# Patient Record
Sex: Male | Born: 1960 | Race: White | Hispanic: No | Marital: Married | Smoking: Former smoker
Health system: Southern US, Community
[De-identification: ages and names within clinical notes are randomized; demographics above are authoritative.]

## PROBLEM LIST (undated history)

## (undated) DIAGNOSIS — C801 Malignant (primary) neoplasm, unspecified: Secondary | ICD-10-CM

## (undated) HISTORY — PX: CHOLECYSTECTOMY: SHX55

## (undated) HISTORY — PX: OTHER SURGICAL HISTORY: SHX169

---

## 2003-08-27 ENCOUNTER — Emergency Department (HOSPITAL_COMMUNITY): Admission: EM | Admit: 2003-08-27 | Discharge: 2003-08-27 | Payer: Self-pay | Admitting: Emergency Medicine

## 2006-04-24 ENCOUNTER — Emergency Department (HOSPITAL_COMMUNITY): Admission: EM | Admit: 2006-04-24 | Discharge: 2006-04-24 | Payer: Self-pay | Admitting: Emergency Medicine

## 2009-11-07 ENCOUNTER — Emergency Department (HOSPITAL_COMMUNITY): Admission: EM | Admit: 2009-11-07 | Discharge: 2009-11-07 | Payer: Self-pay | Admitting: Emergency Medicine

## 2017-06-27 ENCOUNTER — Emergency Department (HOSPITAL_COMMUNITY)
Admission: EM | Admit: 2017-06-27 | Discharge: 2017-06-27 | Disposition: A | Discharge status: Home / Self Care | Payer: Self-pay | Attending: Emergency Medicine | Admitting: Emergency Medicine

## 2017-06-27 ENCOUNTER — Encounter (HOSPITAL_COMMUNITY)
Admission: EM | Disposition: A | Discharge status: Home / Self Care | Payer: Self-pay | Source: Home / Self Care | Attending: Emergency Medicine

## 2017-06-27 ENCOUNTER — Encounter (HOSPITAL_COMMUNITY): Payer: Self-pay

## 2017-06-27 DIAGNOSIS — T39395A Adverse effect of other nonsteroidal anti-inflammatory drugs [NSAID], initial encounter: Secondary | ICD-10-CM

## 2017-06-27 DIAGNOSIS — K317 Polyp of stomach and duodenum: Secondary | ICD-10-CM | POA: Insufficient documentation

## 2017-06-27 DIAGNOSIS — X58XXXA Exposure to other specified factors, initial encounter: Secondary | ICD-10-CM | POA: Insufficient documentation

## 2017-06-27 DIAGNOSIS — W44F3XA Food entering into or through a natural orifice, initial encounter: Secondary | ICD-10-CM

## 2017-06-27 DIAGNOSIS — R1319 Other dysphagia: Secondary | ICD-10-CM

## 2017-06-27 DIAGNOSIS — T18128A Food in esophagus causing other injury, initial encounter: Secondary | ICD-10-CM | POA: Insufficient documentation

## 2017-06-27 DIAGNOSIS — Z87891 Personal history of nicotine dependence: Secondary | ICD-10-CM | POA: Insufficient documentation

## 2017-06-27 DIAGNOSIS — K2970 Gastritis, unspecified, without bleeding: Secondary | ICD-10-CM | POA: Insufficient documentation

## 2017-06-27 DIAGNOSIS — K222 Esophageal obstruction: Secondary | ICD-10-CM | POA: Insufficient documentation

## 2017-06-27 DIAGNOSIS — R1310 Dysphagia, unspecified: Secondary | ICD-10-CM

## 2017-06-27 DIAGNOSIS — K2960 Other gastritis without bleeding: Secondary | ICD-10-CM

## 2017-06-27 HISTORY — PX: ESOPHAGOGASTRODUODENOSCOPY: SHX5428

## 2017-06-27 HISTORY — PX: ESOPHAGEAL DILATION: SHX303

## 2017-06-27 SURGERY — EGD (ESOPHAGOGASTRODUODENOSCOPY)
Anesthesia: Moderate Sedation

## 2017-06-27 MED ORDER — MEPERIDINE HCL 100 MG/ML IJ SOLN
INTRAMUSCULAR | Status: DC | PRN
  Administered 2017-06-27: 25 mg via INTRAVENOUS
  Administered 2017-06-27: 50 mg via INTRAVENOUS
  Administered 2017-06-27 (×2): 25 mg via INTRAVENOUS

## 2017-06-27 MED ORDER — NITROGLYCERIN 0.4 MG SL SUBL
0.4000 mg | SUBLINGUAL_TABLET | Freq: Once | SUBLINGUAL | Status: AC
  Administered 2017-06-27: 0.4 mg via SUBLINGUAL
  Filled 2017-06-27: qty 1

## 2017-06-27 MED ORDER — SODIUM CHLORIDE 0.9 % IV SOLN: INTRAVENOUS | Status: DC

## 2017-06-27 MED ORDER — MINERAL OIL PO OIL
TOPICAL_OIL | ORAL | Status: AC
  Filled 2017-06-27: qty 30

## 2017-06-27 MED ORDER — LORAZEPAM 2 MG/ML IJ SOLN
1.0000 mg | Freq: Once | INTRAMUSCULAR | Status: AC
  Administered 2017-06-27: 1 mg via INTRAVENOUS
  Filled 2017-06-27: qty 1

## 2017-06-27 MED ORDER — LIDOCAINE VISCOUS HCL 2 % MT SOLN
OROMUCOSAL | Status: AC
  Filled 2017-06-27: qty 15

## 2017-06-27 MED ORDER — MIDAZOLAM HCL 5 MG/5ML IJ SOLN
INTRAMUSCULAR | Status: DC | PRN
  Administered 2017-06-27 (×2): 2 mg via INTRAVENOUS
  Administered 2017-06-27: 1 mg via INTRAVENOUS
  Administered 2017-06-27: 2 mg via INTRAVENOUS

## 2017-06-27 MED ORDER — STERILE WATER FOR IRRIGATION IR SOLN
Status: DC | PRN
  Administered 2017-06-27: 15 mL

## 2017-06-27 MED ORDER — MEPERIDINE HCL 100 MG/ML IJ SOLN
INTRAMUSCULAR | Status: AC
  Filled 2017-06-27: qty 2

## 2017-06-27 MED ORDER — LIDOCAINE VISCOUS HCL 2 % MT SOLN
OROMUCOSAL | Status: DC | PRN
  Administered 2017-06-27: 10 mL
  Administered 2017-06-27: 1 via OROMUCOSAL

## 2017-06-27 MED ORDER — MIDAZOLAM HCL 5 MG/5ML IJ SOLN
INTRAMUSCULAR | Status: AC
  Filled 2017-06-27: qty 10

## 2017-06-27 MED ORDER — ONDANSETRON HCL 4 MG/2ML IJ SOLN
INTRAMUSCULAR | Status: AC
  Filled 2017-06-27: qty 2

## 2017-06-27 MED ORDER — ONDANSETRON HCL 4 MG/2ML IJ SOLN
INTRAMUSCULAR | Status: DC | PRN
  Administered 2017-06-27: 4 mg via INTRAVENOUS

## 2017-06-27 MED ORDER — LIDOCAINE VISCOUS HCL 2 % MT SOLN: OROMUCOSAL | 1 refills | Status: DC

## 2017-06-27 MED ORDER — PANTOPRAZOLE SODIUM 40 MG PO TBEC: DELAYED_RELEASE_TABLET | ORAL | 11 refills | Status: DC

## 2017-06-27 MED ORDER — LIDOCAINE VISCOUS HCL 2 % MT SOLN: 15.0000 mL | Freq: Once | OROMUCOSAL | Status: DC

## 2017-06-27 MED ORDER — GLUCAGON HCL RDNA (DIAGNOSTIC) 1 MG IJ SOLR
2.0000 mg | Freq: Once | INTRAMUSCULAR | Status: AC
Start: 2017-06-27 — End: 2017-06-27
  Administered 2017-06-27: 2 mg via INTRAVENOUS
  Filled 2017-06-27: qty 2

## 2017-06-27 MED ORDER — SODIUM CHLORIDE 0.9 % IV SOLN
INTRAVENOUS | Status: DC
  Administered 2017-06-27: 09:00:00 via INTRAVENOUS

## 2017-06-27 NOTE — Discharge Instructions (Signed)
I REMOVED THE STEAK. 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 You have MILD gastritis FROM IBUPROFEN O NAPROXEN, AND SMALL BENIGN STOMACH POLYPS. I biopsied your ESOPHAGUS AND stomach.    DRINK WATER TO KEEP YOUR URINE LIGHT YELLOW.  CONTINUE YOUR WEIGHT LOSS EFFORTS.  FOLLOW A LOW FAT DIET. MEATS SHOULD BE BAKED, BROILED, OR BOILED. AVOID FRIED FOODS. SEE INFO BELOW.   START PROTONIX. TAKE 30 MINUTES PRIOR TO MEALS TWICE DAILY FOR 3 MOS THEN ONCE DAILY.  USE VISCOUS LIDOCAINE 2 TSP with 2 tsp MAALOX OR MYLANTA EVERY 4 HRS TO REDUCE CHEST, OR UPPER ABDOMINAL PAIN OR HEARTBURN. USE NO MORE THAN 8 DOSES A DAY. IT WILL MAKE YOUR MOUTH, ESOPHAGUS, AND STOMACH NUMB.  YOUR BIOPSY RESULTS WILL BE AVAILABLE IN 7 DAYS.   FOLLOW UP IN 3 MOS. WE CAN SCHEDULE YOUR COLONOSCOPY BEFORE OR AFTER YOU NEXT OFFICE VISIT. YOU NEED YOUR NEXT ENDSCOPY PERFORMED WITH PROPOFOL.  UPPER ENDOSCOPY AFTER CARE Read the instructions outlined below and refer to this sheet in the next week. These discharge instructions provide you with general information on caring for yourself after you leave the hospital. While your treatment has been planned according to the most current medical practices available, unavoidable complications occasionally occur. If you have any problems or questions after discharge, call DR. Xochilt Conant, [phone].  ACTIVITY  You may resume your regular activity, but move at a slower pace for the next 24 hours.   Take frequent rest periods for the next 24 hours.   Walking will help get rid of the air and reduce the bloated feeling in your belly (abdomen).   No driving for 24 hours (because of the medicine (anesthesia) used during the test).   You may shower.   Do not sign any important legal documents or operate any machinery for 24 hours (because of the anesthesia used during the test).    NUTRITION  Drink plenty of fluids.   You may resume your normal diet as instructed by your  doctor.   Begin with a light meal and progress to your normal diet. Heavy or fried foods are harder to digest and may make you feel sick to your stomach (nauseated).   Avoid alcoholic beverages for 24 hours or as instructed.    MEDICATIONS  You may resume your normal medications.   WHAT YOU CAN EXPECT TODAY  Some feelings of bloating in the abdomen.   Passage of more gas than usual.    IF YOU HAD A BIOPSY TAKEN DURING THE UPPER ENDOSCOPY:  Eat a soft diet IF YOU HAVE NAUSEA, BLOATING, ABDOMINAL PAIN, OR VOMITING.    FINDING OUT THE RESULTS OF YOUR TEST Not all test results are available during your visit. DR. Oneida Alar WILL CALL YOU WITHIN 14 DAYS OF YOUR PROCEDUE WITH YOUR RESULTS. Do not assume everything is normal if you have not heard from DR. Sheridan Hew, CALL HER OFFICE AT [phone].  SEEK IMMEDIATE MEDICAL ATTENTION AND CALL THE OFFICE: [phone] IF:  You have more than a spotting of blood in your stool.   Your belly is swollen (abdominal distention).   You are nauseated or vomiting.   You have a temperature over 101F.   You have abdominal pain or discomfort that is severe or gets worse throughout the day.  Gastritis  Gastritis is an inflammation (the body's way of reacting to injury and/or infection) of the stomach. It is often caused by viral or bacterial (germ) infections. It can also be caused  BY ASPIRIN, BC/GOODY POWDER'S, (IBUPROFEN) MOTRIN, OR ALEVE (NAPROXEN), chemicals (including alcohol), SPICY FOODS, and medications. This illness may be associated with generalized malaise (feeling tired, not well), UPPER ABDOMINAL STOMACH cramps, and fever. One common bacterial cause of gastritis is an organism known as H. Pylori. This can be treated with antibiotics.   ESOPHAGEAL STRICTURE  Esophageal strictures can be caused by stomach acid backing up into the tube that carries food from the mouth down to the stomach (lower esophagus).  TREATMENT There are a number of  medicines used to treat reflux/stricture, including: Antacids.  Proton-pump inhibitors: PROTONIX ZANTAC OR PEPCID    Low-Fat Diet BREADS, CEREALS, PASTA, RICE, DRIED PEAS, AND BEANS These products are high in carbohydrates and most are low in fat. Therefore, they can be increased in the diet as substitutes for fatty foods. They too, however, contain calories and should not be eaten in excess. Cereals can be eaten for snacks as well as for breakfast.   FRUITS AND VEGETABLES It is good to eat fruits and vegetables. Besides being sources of fiber, both are rich in vitamins and some minerals. They help you get the daily allowances of these nutrients. Fruits and vegetables can be used for snacks and desserts.  MEATS Limit lean meat, chicken, [place], and fish to no more than 6 ounces per day. Beef, Pork, and Lamb Use lean cuts of beef, pork, and lamb. Lean cuts include:  Extra-lean ground beef.  Arm roast.  Sirloin tip.  Center-cut ham.  Round steak.  Loin chops.  Rump roast.  Tenderloin.  Trim all fat off the outside of meats before cooking. It is not necessary to severely decrease the intake of red meat, but lean choices should be made. Lean meat is rich in protein and contains a highly absorbable form of iron. Premenopausal women, in particular, should avoid reducing lean red meat because this could increase the risk for low red blood cells (iron-deficiency anemia).  Chicken and [place] These are good sources of protein. The fat of poultry can be reduced by removing the skin and underlying fat layers before cooking. Chicken and [place] can be substituted for lean red meat in the diet. Poultry should not be fried or covered with high-fat sauces. Fish and Shellfish Fish is a good source of protein. Shellfish contain cholesterol, but they usually are low in saturated fatty acids. The preparation of fish is important. Like chicken and [place], they should not be fried or covered with high-fat  sauces. EGGS Egg whites contain no fat or cholesterol. They can be eaten often. Try 1 to 2 egg whites instead of whole eggs in recipes or use egg substitutes that do not contain yolk. MILK AND DAIRY PRODUCTS Use skim or 1% milk instead of 2% or whole milk. Decrease whole milk, natural, and processed cheeses. Use nonfat or low-fat (2%) cottage cheese or low-fat cheeses made from vegetable oils. Choose nonfat or low-fat (1 to 2%) yogurt. Experiment with evaporated skim milk in recipes that call for heavy cream. Substitute low-fat yogurt or low-fat cottage cheese for sour cream in dips and salad dressings. Have at least 2 servings of low-fat dairy products, such as 2 glasses of skim (or 1%) milk each day to help get your daily calcium intake. FATS AND OILS Reduce the total intake of fats, especially saturated fat. Butterfat, lard, and beef fats are high in saturated fat and cholesterol. These should be avoided as much as possible. Vegetable fats do not contain cholesterol, but certain vegetable  fats, such as coconut oil, palm oil, and palm kernel oil are very high in saturated fats. These should be limited. These fats are often used in bakery goods, processed foods, popcorn, oils, and nondairy creamers. Vegetable shortenings and some peanut butters contain hydrogenated oils, which are also saturated fats. Read the labels on these foods and check for saturated vegetable oils. Unsaturated vegetable oils and fats do not raise blood cholesterol. However, they should be limited because they are fats and are high in calories. Total fat should still be limited to 30% of your daily caloric intake. Desirable liquid vegetable oils are corn oil, cottonseed oil, olive oil, canola oil, safflower oil, soybean oil, and sunflower oil. Peanut oil is not as good, but small amounts are acceptable. Buy a heart-healthy tub margarine that has no partially hydrogenated oils in the ingredients. Mayonnaise and salad dressings often are  made from unsaturated fats, but they should also be limited because of their high calorie and fat content. Seeds, nuts, peanut butter, olives, and avocados are high in fat, but the fat is mainly the unsaturated type. These foods should be limited mainly to avoid excess calories and fat. OTHER EATING TIPS Snacks  Most sweets should be limited as snacks. They tend to be rich in calories and fats, and their caloric content outweighs their nutritional value. Some good choices in snacks are graham crackers, melba toast, soda crackers, bagels (no egg), English muffins, fruits, and vegetables. These snacks are preferable to snack crackers, [place] fries, TORTILLA CHIPS, and POTATO chips. Popcorn should be air-popped or cooked in small amounts of liquid vegetable oil. Desserts Eat fruit, low-fat yogurt, and fruit ices instead of pastries, cake, and cookies. Sherbet, angel food cake, gelatin dessert, frozen low-fat yogurt, or other frozen products that do not contain saturated fat (pure fruit juice bars, frozen ice pops) are also acceptable.  COOKING METHODS Choose those methods that use little or no fat. They include: Poaching.  Braising.  Steaming.  Grilling.  Baking.  Stir-frying.  Broiling.  Microwaving.  Foods can be cooked in a nonstick pan without added fat, or use a nonfat cooking spray in regular cookware. Limit fried foods and avoid frying in saturated fat. Add moisture to lean meats by using water, broth, cooking wines, and other nonfat or low-fat sauces along with the cooking methods mentioned above. Soups and stews should be chilled after cooking. The fat that forms on top after a few hours in the refrigerator should be skimmed off. When preparing meals, avoid using excess salt. Salt can contribute to raising blood pressure in some people.  EATING AWAY FROM HOME Order entres, potatoes, and vegetables without sauces or butter. When meat exceeds the size of a deck of cards (3 to 4 ounces), the  rest can be taken home for another meal. Choose vegetable or fruit salads and ask for low-calorie salad dressings to be served on the side. Use dressings sparingly. Limit high-fat toppings, such as bacon, crumbled eggs, cheese, sunflower seeds, and olives. Ask for heart-healthy tub margarine instead of butter.

## 2017-06-27 NOTE — ED Notes (Signed)
Patient transported to Endo OR.

## 2017-06-27 NOTE — Op Note (Signed)
[facility] Patient Name: Leonard [name] Procedure Date: 06/27/2017 10:50 AM MRN: [id number] Date of Birth: [birth year] Attending MD: Barney Drain MD, MD CSN: [id number] Age: 57 Admit Type: Outpatient Procedure:                Upper GI endoscopy-removal foreign body/COLD                            FORCEPSBIOPSY/ESOPHAGEAL DILATION Indications:              Dysphagia-FOOD IMPACTION SINCE 5 PM YESTERDAY Providers:                Barney Drain MD, MD, Lurline Del, RN, Aram Candela Referring MD:             Edwinna Areola. [place] Crane MD Medicines:                Ondansetron 4 mg IV, Meperidine 125 mg IV,                            Midazolam 7 mg IV Complications:            No immediate complications. Estimated Blood Loss:     Estimated blood loss was minimal. Procedure:                Pre-Anesthesia Assessment:                           - Prior to the procedure, a History and Physical                            was performed, and patient medications and                            allergies were reviewed. The patient's tolerance of                            previous anesthesia was also reviewed. The risks                            and benefits of the procedure and the sedation                            options and risks were discussed with the patient.                            All questions were answered, and informed consent                            was obtained. Prior Anticoagulants: The patient has                            taken ibuprofen, last dose was 1 day prior to                            procedure. ASA Grade Assessment: II - A patient  with mild systemic disease. After reviewing the                            risks and benefits, the patient was deemed in                            satisfactory condition to undergo the procedure.                            After obtaining informed consent, the endoscope was                            passed under direct  vision. Throughout the                            procedure, the patient's blood pressure, pulse, and                            oxygen saturations were monitored continuously. The                            EG-[id number] ([id number]) scope was introduced through the                            mouth, and advanced to the second part of duodenum.                            The upper GI endoscopy was somewhat difficult due                            to the patient's agitation. Successful completion                            of the procedure was aided by increasing the dose                            of sedation medication. The patient tolerated the                            procedure fairly well. Scope In: 51:02:58 AM Scope Out: 12:28:52 PM Total Procedure Duration: 0 hours 32 minutes 44 seconds  Findings:      Food was found in the distal esophagus. Removal of food was accomplished.      Two benign-appearing, intrinsic moderate (circumferential scarring or       stenosis; an endoscope may pass) stenoses were found in the mid       esophagus. The narrowest stenosis measured 1.2 cm (inner diameter). The       stenoses were traversed. A guidewire was placed and the scope was       withdrawn. Dilation was performed with a Savary dilator with mild       resistance at 12.8 mm and moderate resistance at 14 mm, 15 mm, 16 mm and       17 mm. Biopsies were obtained from the proximal(20 CM FROM INCISORS)  and       distal esophagus(40 CM FROM INCISORS) with cold forceps for histology of       suspected eosinophilic esophagitis.      Localized mild inflammation characterized by congestion (edema) and       erythema was found in the gastric body and in the gastric antrum.       Biopsies were taken with a cold forceps for Helicobacter pylori       testing(3: ANTRUM/2: BODY).      Multiple small sessile polyps with no bleeding and no stigmata of recent       bleeding were found in the gastric fundus and in the  gastric body. The       polyp was removed with a cold biopsy forceps. Resection and retrieval       were complete.      The examined duodenum was normal. Impression:               - Food in the distal esophagus. Removal was                            successful.                           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 Dilated.                           - MILD Gastritis DUE TO IBUPROFEN. Biopsied.                           - Multiple gastric polyps. Resected and retrieved. Moderate Sedation:      Moderate (conscious) sedation was administered by the endoscopy nurse       and supervised by the endoscopist. The following parameters were       monitored: oxygen saturation, heart rate, blood pressure, and response       to care. Total physician intraservice time was 46 minutes. Recommendation:           - Await pathology results.                           - Low fat diet.                           - Continue present medications.                           - Use Protonix (pantoprazole) 40 mg PO BID for 3                            months THEN ONCE DAILY FOREVER.                           - Return to my office in 3 months.[place]  [facility] AFTER NEXT OPV.                           - Patient has a contact number available for                            emergencies. The signs and symptoms of potential                            delayed complications were discussed with the                            patient. Return to normal activities tomorrow.                            Written discharge instructions were provided to the                            patient. Procedure Code(s):        --- Professional ---                           [phone], Esophagogastroduodenoscopy, flexible,                            transoral; with removal of foreign body(s)                           43248, Esophagogastroduodenoscopy,  flexible,                            transoral; with insertion of guide wire followed by                            passage of dilator(s) through esophagus over guide                            wire                           43239, Esophagogastroduodenoscopy, flexible,                            transoral; with biopsy, single or multiple                           G0500, Moderate sedation services provided by the                            same physician or other qualified health care                            professional performing a gastrointestinal                            endoscopic service that sedation supports,  requiring the presence of an independent trained                            observer to assist in the monitoring of the                            patient's level of consciousness and physiological                            status; initial 15 minutes of intra-service time;                            patient age 47 years or older (additional time may                            be reported with [phone], as appropriate)                           [phone], Moderate sedation services provided by the                            same physician or other qualified health care                            professional performing the diagnostic or                            therapeutic service that the sedation supports,                            requiring the presence of an independent trained                            observer to assist in the monitoring of the                            patient's level of consciousness and physiological                            status; each additional 15 minutes intraservice                            time (List separately in addition to code for                            primary service)                           [phone], Moderate sedation services provided by the                            same physician or other qualified  health care  professional performing the diagnostic or                            therapeutic service that the sedation supports,                            requiring the presence of an independent trained                            observer to assist in the monitoring of the                            patient's level of consciousness and physiological                            status; each additional 15 minutes intraservice                            time (List separately in addition to code for                            primary service) Diagnosis Code(s):        --- Professional ---                           O11.572I, Food in esophagus causing other injury,                            initial encounter                           K22.2, Esophageal obstruction                           K29.70, Gastritis, unspecified, without bleeding                           K31.7, Polyp of stomach and duodenum                           R13.10, Dysphagia, unspecified CPT copyright 2017 American [employer] Association. All rights reserved. The codes documented in this report are preliminary and upon coder review may  be revised to meet current compliance requirements. Barney Drain, MD Barney Drain MD, MD 06/27/2017 12:48:03 PM This report has been signed electronically. Number of Addenda: 0

## 2017-06-27 NOTE — ED Triage Notes (Addendum)
Pt reports that he has a piece of steak lodged in throat since last night approx 5 pm. Able to swallow saliva and denies difficulty breathing. Unable to swallow liquids

## 2017-06-27 NOTE — ED Provider Notes (Signed)
[facility] EMERGENCY DEPARTMENT Provider Note   CSN: [id number] Arrival date & time: 06/27/17  0539     History   Chief Complaint Chief Complaint  Patient presents with  . Dysphagia    HPI Doc Mandala Southern is a 57 y.o. male.  HPI  68yM with dysphagia.  Patient was eating steak around 5 PM yesterday when this occurred.  Since then he has had a persistent sensation foreign body sensation in his throat.  He has been unable to eat or drink since.  States that the saliva will pool in his mouth and then he has to spit it out. No acute respiratory complaints.  He has had problems previously with things sticking but usually this resolves spontaneously after few minutes.  History reviewed. No pertinent past medical history.  There are no active problems to display for this patient.   Past Surgical History:  Procedure Laterality Date  . CHOLECYSTECTOMY    . knee sx          Home Medications    Prior to Admission medications   Not on File    Family History No family history on file.  Social History Social History   Tobacco Use  . Smoking status: Former Smoker  Substance Use Topics  . Alcohol use: Yes  . Drug use: Never     Allergies   Patient has no allergy information on record.   Review of Systems Review of Systems All systems reviewed and negative, other than as noted in HPI.   Physical Exam Updated Vital Signs BP (!) 146/93   Pulse 62   Temp 97.9 F (36.6 C) (Oral)   Resp 18   Wt 108.9 kg (240 lb)   SpO2 100%   Physical Exam  Constitutional: He appears well-developed and well-nourished. No distress.  HENT:  Head: Normocephalic and atraumatic.  Eyes: Conjunctivae are normal. Right eye exhibits no discharge. Left eye exhibits no discharge.  Neck: Neck supple.  Cardiovascular: Normal rate, regular rhythm and normal heart sounds. Exam reveals no gallop and no friction rub.  No murmur heard. Pulmonary/Chest: Effort normal and breath sounds normal.  No respiratory distress.  Abdominal: Soft. He exhibits no distension. There is no tenderness.  Musculoskeletal: He exhibits no edema or tenderness.  Neurological: He is alert.  Skin: Skin is warm and dry.  Psychiatric: He has a normal mood and affect. His behavior is normal. Thought content normal.  Nursing note and vitals reviewed.    ED Treatments / Results  Labs (all labs ordered are listed, but only abnormal results are displayed) Labs Reviewed - No data to display  EKG None  Radiology No results found.  Procedures Procedures (including critical care time)  Medications Ordered in ED Medications  0.9 %  sodium chloride infusion (has no administration in time range)  glucagon (human recombinant) (GLUCAGEN) injection 2 mg (has no administration in time range)  LORazepam (ATIVAN) injection 1 mg (has no administration in time range)     Initial Impression / Assessment and Plan / ED Course  I have reviewed the triage vital signs and the nursing notes.  Pertinent labs & imaging results that were available during my care of the patient were reviewed by me and considered in my medical decision making (see chart for details).     57 year old male with esophageal food impaction.  Glucagon and nitroglycerin tried without resolution.  IV was established.  GI was consulted.  Final Clinical Impressions(s) / ED Diagnoses   Final  diagnoses:  Esophageal obstruction due to food impaction    ED Discharge Orders    None       Virgel Manifold, MD 06/27/17 [phone]

## 2017-06-27 NOTE — Consult Note (Addendum)
  Primary Care Physician:  Celene Squibb, MD Primary Gastroenterologist:  Dr. Oneida Alar  Pre-Procedure History & Physical: HPI:  Leonard [name] is a 57 y.o. male here for STEAK STUCK IN HIS ESOPHAGUS SINCE 5 PM YESTERDAY. PROBLEMS SWALLOWING FOR MANY YEARS. OVER PAST MONTH HAS GOTTEN WORSE. HAD GB TAKEN OUT FOR ABDOMINAL PAIN BUT NONE NOW. NO WEIGHT LOSS. NO TCS EVER. HEARTBURN IF EATS SOMETHING SPICY.  PT DENIES FEVER, CHILLS, HEMATOCHEZIA, HEMATEMESIS, nausea, vomiting, melena, diarrhea, CHEST PAIN, SHORTNESS OF BREATH,  CHANGE IN BOWEL IN HABITS, constipation, abdominal pain, OR problems with sedation.   PAST MEDICAL HISTORY: 1. KNEE PAIN, RIGHT  Past Surgical History:  Procedure Laterality Date  . CHOLECYSTECTOMY    . knee sx      Prior to Admission medications   IBUPROFEN 3 IN THE AM   Family History  Problem Relation Age of Onset  . Colon cancer Neg Hx   . Colon polyps Neg Hx     Social History   Tobacco Use  . Smoking status: Former Research scientist (life sciences)  . Smokeless tobacco: Never Used  Substance Use Topics  . Alcohol use: Yes  . Drug use: Never     Review of Systems: See HPI, otherwise negative ROS   Physical Exam: BP 136/89   Pulse 65   Temp 97.9 F (36.6 C) (Oral)   Resp 18   Wt 240 lb (108.9 kg)   SpO2 95%  General:   Alert,  pleasant and cooperative in NAD Head:  Normocephalic and atraumatic. Neck:  Supple; Lungs:  Clear throughout to auscultation.    Heart:  Regular rate and rhythm. Abdomen:  Soft, nontender and nondistended. Normal bowel sounds, without guarding, and without rebound.   Neurologic:  Alert and  oriented x4;  grossly normal neurologically.  Impression/Plan:    DYSPHAGIA  PLAN:  EGD/DIL TODAY. DISCUSSED PROCEDURE, BENEFITS, & RISKS: < 1% chance of medication reaction, bleeding, OR perforation. TCS IN THE FUTURE.

## 2017-06-30 ENCOUNTER — Encounter: Payer: Self-pay | Admitting: Gastroenterology

## 2017-06-30 ENCOUNTER — Telehealth: Payer: Self-pay | Admitting: Gastroenterology

## 2017-06-30 NOTE — Telephone Encounter (Signed)
Pt is aware of results and plan.

## 2017-06-30 NOTE — Telephone Encounter (Signed)
Please call pt. HIS ESOPHAGEAL BIOPSIES SHOW REFLUX. His stomach Bx shows BENIGN POLYPS REMOVED FROM HIS STOMACH.    DRINK WATER TO KEEP YOUR URINE LIGHT YELLOW.  CONTINUE YOUR WEIGHT LOSS EFFORTS.  FOLLOW A LOW FAT DIET. MEATS SHOULD BE BAKED, BROILED, OR BOILED. AVOID FRIED FOODS.    START PROTONIX. TAKE 30 MINUTES PRIOR TO MEALS TWICE DAILY FOR 3 MOS THEN ONCE DAILY.  USE VISCOUS LIDOCAINE 2 TSP with 2 tsp MAALOX OR MYLANTA EVERY 4 HRS TO REDUCE CHEST, OR UPPER ABDOMINAL PAIN OR HEARTBURN. USE NO MORE THAN 8 DOSES A DAY. IT WILL MAKE YOUR MOUTH, ESOPHAGUS, AND STOMACH NUMB.  FOLLOW UP IN 3 MOS E30 DYSPHAGIA/GERD. WE CAN SCHEDULE YOUR COLONOSCOPY BEFORE OR AFTER YOU NEXT OFFICE VISIT. YOU NEED YOUR NEXT ENDSCOPY PERFORMED WITH PROPOFOL.

## 2017-06-30 NOTE — Telephone Encounter (Signed)
PATIENT SCHEDULED  °

## 2017-07-02 ENCOUNTER — Encounter (HOSPITAL_COMMUNITY): Payer: Self-pay | Admitting: Gastroenterology

## 2017-09-30 ENCOUNTER — Ambulatory Visit: Payer: Self-pay | Admitting: Gastroenterology

## 2018-08-01 ENCOUNTER — Encounter (HOSPITAL_COMMUNITY): Payer: Self-pay

## 2018-08-01 ENCOUNTER — Observation Stay (HOSPITAL_COMMUNITY)
Admission: EM | Admit: 2018-08-01 | Discharge: 2018-08-02 | Disposition: A | Discharge status: Home / Self Care | Payer: Self-pay | Attending: Internal Medicine | Admitting: Internal Medicine

## 2018-08-01 ENCOUNTER — Emergency Department (HOSPITAL_COMMUNITY): Payer: Self-pay

## 2018-08-01 ENCOUNTER — Other Ambulatory Visit: Payer: Self-pay

## 2018-08-01 DIAGNOSIS — Z20828 Contact with and (suspected) exposure to other viral communicable diseases: Secondary | ICD-10-CM | POA: Insufficient documentation

## 2018-08-01 DIAGNOSIS — Z87891 Personal history of nicotine dependence: Secondary | ICD-10-CM | POA: Insufficient documentation

## 2018-08-01 DIAGNOSIS — M79669 Pain in unspecified lower leg: Secondary | ICD-10-CM

## 2018-08-01 DIAGNOSIS — R2242 Localized swelling, mass and lump, left lower limb: Principal | ICD-10-CM | POA: Insufficient documentation

## 2018-08-01 DIAGNOSIS — L02612 Cutaneous abscess of left foot: Secondary | ICD-10-CM | POA: Insufficient documentation

## 2018-08-01 DIAGNOSIS — M7989 Other specified soft tissue disorders: Secondary | ICD-10-CM

## 2018-08-01 LAB — CBC WITH DIFFERENTIAL/PLATELET
Abs Immature Granulocytes: 0.01 10*3/uL (ref 0.00–0.07)
Basophils Absolute: 0 10*3/uL (ref 0.0–0.1)
Basophils Relative: 1 %
Eosinophils Absolute: 0.3 10*3/uL (ref 0.0–0.5)
Eosinophils Relative: 5 %
HCT: 45.1 % (ref 39.0–52.0)
Hemoglobin: 15.1 g/dL (ref 13.0–17.0)
Immature Granulocytes: 0 %
Lymphocytes Relative: 21 %
Lymphs Abs: 1.3 10*3/uL (ref 0.7–4.0)
MCH: 31.5 pg (ref 26.0–34.0)
MCHC: 33.5 g/dL (ref 30.0–36.0)
MCV: 94 fL (ref 80.0–100.0)
Monocytes Absolute: 0.6 10*3/uL (ref 0.1–1.0)
Monocytes Relative: 11 %
Neutro Abs: 3.7 10*3/uL (ref 1.7–7.7)
Neutrophils Relative %: 62 %
Platelets: 255 10*3/uL (ref 150–400)
RBC: 4.8 MIL/uL (ref 4.22–5.81)
RDW: 12.6 % (ref 11.5–15.5)
WBC: 6 10*3/uL (ref 4.0–10.5)
nRBC: 0 % (ref 0.0–0.2)

## 2018-08-01 LAB — BASIC METABOLIC PANEL
Anion gap: 9 (ref 5–15)
BUN: 13 mg/dL (ref 6–20)
CO2: 24 mmol/L (ref 22–32)
Calcium: 9.1 mg/dL (ref 8.9–10.3)
Chloride: 108 mmol/L (ref 98–111)
Creatinine, Ser: 0.92 mg/dL (ref 0.61–1.24)
GFR calc Af Amer: >60 mL/min (ref >60)
GFR calc non Af Amer: >60 mL/min (ref >60)
Glucose, Bld: 109 mg/dL — ABNORMAL HIGH (ref 70–99)
Potassium: 3.8 mmol/L (ref 3.5–5.1)
Sodium: 141 mmol/L (ref 135–145)

## 2018-08-01 LAB — SARS CORONAVIRUS 2 BY RT PCR (HOSPITAL ORDER, PERFORMED IN ~~LOC~~ HOSPITAL LAB): SARS Coronavirus 2: NEGATIVE

## 2018-08-01 MED ORDER — VANCOMYCIN HCL IN DEXTROSE 1-5 GM/200ML-% IV SOLN
1000.0000 mg | INTRAVENOUS | Status: AC
  Administered 2018-08-01 (×2): 1000 mg via INTRAVENOUS
  Filled 2018-08-01 (×2): qty 200

## 2018-08-01 MED ORDER — ACETAMINOPHEN 325 MG PO TABS: 650.0000 mg | ORAL_TABLET | Freq: Four times a day (QID) | ORAL | Status: DC | PRN

## 2018-08-01 MED ORDER — MORPHINE SULFATE (PF) 2 MG/ML IV SOLN: 2.0000 mg | INTRAVENOUS | Status: DC | PRN

## 2018-08-01 MED ORDER — VANCOMYCIN HCL 1.25 G IV SOLR
1250.0000 mg | Freq: Two times a day (BID) | INTRAVENOUS | Status: DC
  Administered 2018-08-02: 1250 mg via INTRAVENOUS
  Filled 2018-08-01 (×3): qty 1250

## 2018-08-01 MED ORDER — ONDANSETRON HCL 4 MG/2ML IJ SOLN: 4.0000 mg | Freq: Four times a day (QID) | INTRAMUSCULAR | Status: DC | PRN

## 2018-08-01 MED ORDER — ACETAMINOPHEN 650 MG RE SUPP: 650.0000 mg | Freq: Four times a day (QID) | RECTAL | Status: DC | PRN

## 2018-08-01 MED ORDER — ONDANSETRON HCL 4 MG PO TABS: 4.0000 mg | ORAL_TABLET | Freq: Four times a day (QID) | ORAL | Status: DC | PRN

## 2018-08-01 MED ORDER — POLYETHYLENE GLYCOL 3350 17 G PO PACK: 17.0000 g | PACK | Freq: Every day | ORAL | Status: DC | PRN

## 2018-08-01 NOTE — Progress Notes (Signed)
Pharmacy Antibiotic Note  Leonard [name] is a 58 y.o. male admitted on 08/01/2018 with abscess.  Pharmacy has been consulted for Vancomycin dosing.  Plan: Vancomycin 2000 mg IV x 1 dose. Vancomycin 1250 mg IV every 12 hours.  Goal trough 15-20 mcg/mL.  Monitor labs, c/s, and  vanco levels as indicated.  Height: 5\' 11"  (180.3 cm) Weight: 250 lb (113.4 kg) IBW/kg (Calculated) : 75.3  Temp (24hrs), Avg:97.9 F (36.6 C), Min:97.9 F (36.6 C), Max:97.9 F (36.6 C)  Recent Labs  Lab 08/01/18 1211  WBC 6.0  CREATININE 0.92    Estimated Creatinine Clearance: 112 mL/min (by C-G formula based on SCr of 0.92 mg/dL).    No Known Allergies  Antimicrobials this admission: Vanco 7/20 >>     Dose adjustments this admission: N/A  Microbiology results: None pending  Thank you for allowing pharmacy to be a part of this patient's care.  Ramond Craver 08/01/2018 7:30 PM

## 2018-08-01 NOTE — ED Triage Notes (Signed)
Pt presents to ED with left leg swelling x 3 weeks. Pt states he noticed a knot pop up about 3 weeks ago and it has progressively gotten worse since then.

## 2018-08-01 NOTE — ED Notes (Signed)
Report given to [facility] on 300

## 2018-08-01 NOTE — ED Notes (Addendum)
Right thigh noted to be swollen and painful to touch

## 2018-08-01 NOTE — ED Provider Notes (Signed)
[facility] EMERGENCY DEPARTMENT Provider Note   CSN: [id number] Arrival date & time: 08/01/18  1059    History   Chief Complaint Chief Complaint  Patient presents with  . Leg Swelling    HPI Leonard [name] is a 58 y.o. male.     HPI Patient presents with left calf pain.  Began around a month ago.  Initially there was some swelling but now his pain.  Even having difficulty walking on it now.  There was no injury or pop that began with the pain is.  States the foot and lower leg were so swollen he had trouble getting his flip-flops on yesterday.  No chest pain or trouble breathing.  No fevers.  Has not really had swelling like this before. Also on his right fifth toe has a hole.  States it started as a little blister and now is more swollen and painful.  There has been drainage out of it. History reviewed. No pertinent past medical history.  Patient Active Problem List   Diagnosis Date Noted  . Food impaction of esophagus 06/27/2017  . NSAID induced gastritis   . Esophageal dysphagia     Past Surgical History:  Procedure Laterality Date  . CHOLECYSTECTOMY    . ESOPHAGEAL DILATION N/A 06/27/2017   Procedure: ESOPHAGEAL DILATION;  Surgeon: Danie Binder, MD;  Location: [facility];  Service: Endoscopy;  Laterality: N/A;  . ESOPHAGOGASTRODUODENOSCOPY N/A 06/27/2017   Procedure: ESOPHAGOGASTRODUODENOSCOPY (EGD);  Surgeon: Danie Binder, MD;  Location: [facility];  Service: Endoscopy;  Laterality: N/A;  . knee sx          Home Medications    Prior to Admission medications   Medication Sig Start Date End Date Taking? Authorizing Provider  naproxen sodium (ALEVE) 220 MG tablet Take 660 mg by mouth daily as needed (pain and inflamation).   Yes [provider]  lidocaine (XYLOCAINE) 2 % solution 2 TSP  PO 30 MINUTES PRIOR TO MEALS PRN FOR ABDOMINAL OR CHEST PAIN. REPEAT DOSE EVERY 4 HOURS. NO MORE THAN 8 DOSES A DAY. Patient not taking: Reported on 08/01/2018  06/27/17   Danie Binder, MD  pantoprazole (PROTONIX) 40 MG tablet 1 PO 30 MINUTES PRIOR TO MEALS BID FOR 3 MOS THEN QD Patient not taking: Reported on 08/01/2018 06/27/17   Danie Binder, MD    Family History Family History  Problem Relation Age of Onset  . Colon cancer Neg Hx   . Colon polyps Neg Hx     Social History Social History   Tobacco Use  . Smoking status: Former Research scientist (life sciences)  . Smokeless tobacco: Never Used  Substance Use Topics  . Alcohol use: Yes  . Drug use: Never     Allergies   Patient has no known allergies.   Review of Systems Review of Systems  Constitutional: Negative for appetite change.  Respiratory: Negative for shortness of breath.   Cardiovascular: Positive for leg swelling. Negative for chest pain.  Gastrointestinal: Negative for abdominal pain.  Genitourinary: Negative for flank pain.  Musculoskeletal: Negative for back pain.       Right little toe welling and drainage.  Skin: Positive for wound.  Neurological: Negative for weakness.  Psychiatric/Behavioral: Negative for confusion.     Physical Exam Updated Vital Signs BP (!) 155/96   Pulse 61   Temp 97.9 F (36.6 C) (Oral)   Resp 14   Ht 5\' 11"  (1.803 m)   Wt 113.4 kg  SpO2 96%   BMI 34.87 kg/m   Physical Exam Vitals signs and nursing note reviewed.  HENT:     Head: Normocephalic.  Eyes:     Pupils: Pupils are equal, round, and reactive to light.  Cardiovascular:     Rate and Rhythm: Normal rate and regular rhythm.  Pulmonary:     Effort: Pulmonary effort is normal.     Breath sounds: No wheezing, rhonchi or rales.  Abdominal:     Tenderness: There is no abdominal tenderness.  Musculoskeletal:     Comments: Moderate edema over left lower leg and foot.  Tenderness over inferior aspect of calf.  Does have flexion and extension at the calf although with some pain.  No ecchymosis.  No erythema. Right fifth toe on the lateral plantar aspect has a small hole.  With squeezing of  the toe there is watery purulent discharge comes out of this hole.  No clear bony tenderness.  Skin:    General: Skin is warm.  Neurological:     Mental Status: He is alert. Mental status is at baseline.      ED Treatments / Results  Labs (all labs ordered are listed, but only abnormal results are displayed) Labs Reviewed  BASIC METABOLIC PANEL - Abnormal; Notable for the following components:      Result Value   Glucose, Bld 109 (*)    All other components within normal limits  CBC WITH DIFFERENTIAL/PLATELET    EKG None  Radiology No results found.  Procedures Procedures (including critical care time)  Medications Ordered in ED Medications - No data to display   Initial Impression / Assessment and Plan / ED Course  I have reviewed the triage vital signs and the nursing notes.  Pertinent labs & imaging results that were available during my care of the patient were reviewed by me and considered in my medical decision making (see chart for details).        Patient with left lower leg pain and swelling.  Ultrasound pending.  Also right fifth toe abscess.  Draining will require antibiotics.  Care will be turned over to Dr. Lacinda Axon.  Final Clinical Impressions(s) / ED Diagnoses   Final diagnoses:  Calf pain  Toe abscess, left  Left leg swelling    ED Discharge Orders    None       Davonna Belling, MD 08/01/18 1450

## 2018-08-01 NOTE — ED Provider Notes (Signed)
Ultrasound of left lower extremity reveals a 15.9 cm mixed echogenic subcutaneous mass.  Physical exam suggests an abscess.  Radiologist recommended an MRI of this area.  Will consult hospitalist for probable admission to [facility].   Nat Christen, MD 08/01/18 [id number]

## 2018-08-01 NOTE — ED Notes (Signed)
Results of [place] and xray given to EDP

## 2018-08-01 NOTE — H&P (Signed)
History and Physical    Krishan Mcbreen Lariviere KTG:[id number] DOB: [birth year] DOA: 08/01/2018  PCP: Celene Squibb, MD   Patient coming from: Home  I have personally briefly reviewed patient's old medical records in [place]  Chief Complaint: Left leg swelling, redness and pain  HPI: DRACEN REIGLE is a 58 y.o. male with medical history significant for gastritis the ED with complaints of left lower extremity swelling redness and pain over the past 3 to 4 weeks.  Symptoms have gradually progressed.  He denies fever or chills, no vomiting, no history of blood clots in lungs or legs.  Denies trauma to his left lower extremity.  He has a small bruise on the undersurface of his fifth small toe-present over the past 1 to 2 weeks.  Patient denies any significant medical history.  He is not on medications.  ED Course: Temp 97.9.  Stable vitals.  WBC 6.  Unremarkable BMP.  X-ray of the right fifth toe and venous Doppler of the left lower extremity was obtained.  I am unable to view the results at this time.  But ED provider tells me results show a 15.9 cm subcutaneous mass, hematoma versus abscess . Solid mass not excluded.  MRI recommended for further evaluation.  Review of Systems: As per HPI all other systems reviewed and negative.  History reviewed. No pertinent past medical history.  Past Surgical History:  Procedure Laterality Date  . CHOLECYSTECTOMY    . ESOPHAGEAL DILATION N/A 06/27/2017   Procedure: ESOPHAGEAL DILATION;  Surgeon: Danie Binder, MD;  Location: [facility];  Service: Endoscopy;  Laterality: N/A;  . ESOPHAGOGASTRODUODENOSCOPY N/A 06/27/2017   Procedure: ESOPHAGOGASTRODUODENOSCOPY (EGD);  Surgeon: Danie Binder, MD;  Location: [facility];  Service: Endoscopy;  Laterality: N/A;  . knee sx       reports that he has quit smoking. He has never used smokeless tobacco. He reports current alcohol use. He reports that he does not use drugs.  No Known Allergies  Family  History  Problem Relation Age of Onset  . Colon cancer Neg Hx   . Colon polyps Neg Hx      Prior to Admission medications   Medication Sig Start Date End Date Taking? Authorizing Provider  naproxen sodium (ALEVE) 220 MG tablet Take 660 mg by mouth daily as needed (pain and inflamation).   Yes [provider]  lidocaine (XYLOCAINE) 2 % solution 2 TSP  PO 30 MINUTES PRIOR TO MEALS PRN FOR ABDOMINAL OR CHEST PAIN. REPEAT DOSE EVERY 4 HOURS. NO MORE THAN 8 DOSES A DAY. Patient not taking: Reported on 08/01/2018 06/27/17   Danie Binder, MD  pantoprazole (PROTONIX) 40 MG tablet 1 PO 30 MINUTES PRIOR TO MEALS BID FOR 3 MOS THEN QD Patient not taking: Reported on 08/01/2018 06/27/17   Danie Binder, MD    Physical Exam: Vitals:   08/01/18 1142 08/01/18 1300 08/01/18 1330 08/01/18 1831  BP: (!) 139/91 (!) 148/99 (!) 155/96 (!) 134/93  Pulse: 72 60 61 74  Resp: 14   16  Temp: 97.9 F (36.6 C)     TempSrc: Oral     SpO2: 98% 97% 96% 99%  Weight: 113.4 kg     Height: 5\' 11"  (1.803 m)       Constitutional: NAD, calm, comfortable Vitals:   08/01/18 1142 08/01/18 1300 08/01/18 1330 08/01/18 1831  BP: (!) 139/91 (!) 148/99 (!) 155/96 (!) 134/93  Pulse: 72 60 61 74  Resp: 14   16  Temp: 97.9 F (36.6 C)     TempSrc: Oral     SpO2: 98% 97% 96% 99%  Weight: 113.4 kg     Height: 5\' 11"  (1.803 m)      Eyes: PERRL, lids and conjunctivae normal ENMT: Mucous membranes are moist. Posterior pharynx clear of any exudate or lesions.Normal dentition.  Neck: normal, supple, no masses, no thyromegaly Respiratory: clear to auscultation bilaterally, no wheezing, no crackles. Normal respiratory effort. No accessory muscle use.  Cardiovascular: Regular rate and rhythm, no murmurs / rubs / gallops. No extremity edema. 2+ pedal pulses.  Abdomen: no tenderness, no masses palpated. No hepatosplenomegaly. Bowel sounds positive.  Musculoskeletal: no clubbing / cyanosis. No joint deformity upper and  lower extremities. Good ROM, no contractures. Normal muscle tone.  Diffuse swelling involving lateral to posterior surface of left calf extending downwards towards lower third of leg.  Diffuse area ~ >10cm, with differential warmth, swelling more appreciated with palpation, not really appreciated visually, area is firm, with tenderness on palpation, with slight erythema. No fluctuance appreciated.  Small bruise with slight bleeding to ventral surface of fifth toe-likely from rubbing against flip-flop.  Skin: no rashes, lesions, ulcers. No induration Neurologic: CN 2-12 grossly intact.  Strength 5/5 in all 4.  Psychiatric: Normal judgment and insight. Alert and oriented x 3. Normal mood.   Labs on Admission: I have personally reviewed following labs and imaging studies  CBC: Recent Labs  Lab 08/01/18 1211  WBC 6.0  NEUTROABS 3.7  HGB 15.1  HCT 45.1  MCV 94.0  PLT 578   Basic Metabolic Panel: Recent Labs  Lab 08/01/18 1211  NA 141  K 3.8  CL 108  CO2 24  GLUCOSE 109*  BUN 13  CREATININE 0.92  CALCIUM 9.1    Radiological Exams on Admission: No results found.  EKG: None  Assessment/Plan Active Problems:   Localized swelling of left lower leg  Left lower extremity swelling- ~3-4 wks. With differential warmth, tenderness to palpation, slight erythema.  WBC 6.  Patient rules out for sepsis.  Unable to review results of ultrasound.  ED provider with reports to me-15.9 cm subcutaneous mass-hematoma versus abscess versus solid mass.  MRI recommended.  No DVT.  No fluctuance on exam, and area appears too diffuse to be lanced in the ED. -Considering high likelihood of infectious etiology will start IV vancomycin, pharmacy to dose. -MRI with contrast of left lower extremity in a.m. -CBC, BMP a.m. - HGBa1c , HIV routine screening -IV morphine 2 mg q. 4 hr PRN -General surgery consult - N. P.O. midnight  DVT prophylaxis: SCDS Code Status: Full Family Communication: None at  bedside Disposition Plan: Per rounding team Consults called: General surgery Admission status: Observation,Med-surg.  Bethena Roys MD [facility]  08/01/2018, 7:29 PM

## 2018-08-02 ENCOUNTER — Encounter (HOSPITAL_COMMUNITY): Payer: Self-pay

## 2018-08-02 ENCOUNTER — Observation Stay (HOSPITAL_COMMUNITY): Payer: Self-pay

## 2018-08-02 DIAGNOSIS — R2242 Localized swelling, mass and lump, left lower limb: Secondary | ICD-10-CM | POA: Diagnosis present

## 2018-08-02 DIAGNOSIS — M7989 Other specified soft tissue disorders: Secondary | ICD-10-CM

## 2018-08-02 LAB — BASIC METABOLIC PANEL
Anion gap: 7 (ref 5–15)
BUN: 14 mg/dL (ref 6–20)
CO2: 24 mmol/L (ref 22–32)
Calcium: 8.6 mg/dL — ABNORMAL LOW (ref 8.9–10.3)
Chloride: 107 mmol/L (ref 98–111)
Creatinine, Ser: 0.79 mg/dL (ref 0.61–1.24)
GFR calc Af Amer: >60 mL/min (ref >60)
GFR calc non Af Amer: >60 mL/min (ref >60)
Glucose, Bld: 99 mg/dL (ref 70–99)
Potassium: 3.8 mmol/L (ref 3.5–5.1)
Sodium: 138 mmol/L (ref 135–145)

## 2018-08-02 LAB — CBC
HCT: 42.7 % (ref 39.0–52.0)
Hemoglobin: 14.3 g/dL (ref 13.0–17.0)
MCH: 32 pg (ref 26.0–34.0)
MCHC: 33.5 g/dL (ref 30.0–36.0)
MCV: 95.5 fL (ref 80.0–100.0)
Platelets: 216 10*3/uL (ref 150–400)
RBC: 4.47 MIL/uL (ref 4.22–5.81)
RDW: 12.6 % (ref 11.5–15.5)
WBC: 5.4 10*3/uL (ref 4.0–10.5)
nRBC: 0 % (ref 0.0–0.2)

## 2018-08-02 LAB — HEMOGLOBIN A1C
Hgb A1c MFr Bld: 5.5 % (ref 4.8–5.6)
Mean Plasma Glucose: 111.15 mg/dL

## 2018-08-02 MED ORDER — TRAMADOL HCL 50 MG PO TABS: 50.0000 mg | ORAL_TABLET | Freq: Four times a day (QID) | ORAL | 0 refills | Status: DC | PRN

## 2018-08-02 MED ORDER — GADOBUTROL 1 MMOL/ML IV SOLN
10.0000 mL | Freq: Once | INTRAVENOUS | Status: AC | PRN
  Administered 2018-08-02: 10 mL via INTRAVENOUS

## 2018-08-02 NOTE — Discharge Summary (Signed)
Physician Discharge Summary  Leonard [name] Fan TKZ:[id number] DOB: 1960/09/26 DOA: 08/01/2018  PCP: Celene Squibb, MD  Admit date: 08/01/2018 Discharge date: 08/02/2018  Admitted From: Home Disposition: Home  Recommendations for Outpatient Follow-up:  1. Follow up with PCP in 1-2 weeks 2. Please obtain BMP/CBC in one week 3. Patient will be scheduled to follow-up with orthopedic oncology at [facility]  Discharge Condition: Stable CODE STATUS: Full code Diet recommendation: Regular diet  Brief/Interim Summary: 58 year old male who does not have any significant past medical history, presented to the hospital with complaints of swelling and tenderness in his left lower leg.  He reports the symptoms have been present for approximately 5 to 6 weeks.  They have been progressively escalating.  His pain has gotten worse and he is having difficulty ambulating.  He has not had any fever, weight loss.  Venous Dopplers were done that ruled out DVT.  He underwent MRI that showed large mass and gastrocnemius concerning for sarcoma.  General surgery was initially consulted, but due to findings on imaging had recommended orthopedic consult.  Once MRI findings became available, case was reviewed with Dr. Mylo Red with orthopedic oncology at [facility].  It was recommended the patient will follow-up with their service for further work-up.  Patient was agreeable to this plan.  Orthopedic oncology clinic will contact patient for an appointment.  He is felt stable for discharge.  Discharge Diagnoses:  Active Problems:   Localized swelling of left lower leg   Leg mass, left    Discharge Instructions  Discharge Instructions    Diet - low sodium heart healthy   Complete by: As directed    Increase activity slowly   Complete by: As directed      Allergies as of 08/02/2018   No Known Allergies     Medication List    STOP taking these medications   lidocaine 2 %  solution Commonly known as: XYLOCAINE   naproxen sodium 220 MG tablet Commonly known as: ALEVE   pantoprazole 40 MG tablet Commonly known as: Protonix     TAKE these medications   traMADol 50 MG tablet Commonly known as: ULTRAM Take 1 tablet (50 mg total) by mouth every 6 (six) hours as needed.      Follow-up Information    Janice Coffin, MD Follow up.   Specialty: Orthopedic Surgery Why: they will call you with an appointment Contact information: [address] [place] [address] [phone]          No Known Allergies  Consultations:  General surgery   Procedures/Studies: Mr Tibia Fibula Left W Wo Contrast  Result Date: 08/02/2018 CLINICAL DATA:  Palpable lesion in the posterior aspect of the left lower leg for 2 weeks. EXAM: MRI OF LOWER LEFT EXTREMITY WITHOUT AND WITH CONTRAST TECHNIQUE: Multiplanar, multisequence MR imaging of the left lower leg was performed both before and after administration of intravenous contrast. CONTRAST:  10 cc Gadavist IV. COMPARISON:  Ultrasound of the left lower leg 08/01/2018. FINDINGS: Bones/Joint/Cartilage Marrow signal is normal throughout. No fracture, stress change or focal lesion. Ligaments Negative. Muscles and Tendons There is a lesion in the posterior compartment of the left lower leg measuring approximately 8.5 cm transverse by 6 cm AP by 15 cm craniocaudal. The lesion is centered in the gastrocnemius musculature. The mass is multi-septated with extensive T2 hyperintensity. In the inferior and posterior aspect of the lesion, there is T1 hyperintensity on noncontrast imaging consistent  with some hemorrhage. 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 Soft tissues There is some subcutaneous edema about the lower leg. 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 Recommend consultation with Orthopedic Oncology. These results will be called to the ordering clinician or representative by the Radiologist Assistant, and communication documented in the PACS or zVision Dashboard. Electronically Signed   By: Inge Rise M.D.   On: 08/02/2018 11:09   US Venous Img Lower Unilateral Left  Result Date: 08/01/2018 CLINICAL DATA:  Lower extremity pain and edema. Palpable lump involving the left lower extremity for the past 3 weeks. No known injury. EXAM: LEFT LOWER EXTREMITY VENOUS DOPPLER ULTRASOUND TECHNIQUE: Gray-scale sonography with graded compression, as well as color Doppler and duplex ultrasound were performed to evaluate the lower extremity deep venous systems from the level of the common femoral vein and including the common femoral, femoral, profunda femoral, popliteal and calf veins including the posterior tibial, peroneal and gastrocnemius veins when visible. The superficial great saphenous vein was also interrogated. Spectral Doppler was utilized to evaluate flow at rest and with distal augmentation maneuvers in the common femoral, femoral and popliteal veins. COMPARISON:  None. FINDINGS: Contralateral Common Femoral Vein: Respiratory phasicity is normal and symmetric with the symptomatic side. No evidence of thrombus. Normal compressibility. Common Femoral Vein: No evidence of thrombus. Normal compressibility, respiratory phasicity and response to augmentation. Saphenofemoral Junction: No evidence of thrombus. Normal compressibility and flow on color Doppler imaging. Profunda Femoral Vein: No evidence of thrombus. Normal compressibility and flow on color Doppler imaging. Femoral Vein: No evidence of thrombus. Normal compressibility, respiratory phasicity and response to augmentation. Popliteal Vein: No evidence of thrombus. Normal compressibility, respiratory phasicity and response to augmentation. Calf Veins:  No evidence of thrombus. Normal compressibility and flow on color Doppler imaging. Superficial Great Saphenous Vein: No evidence of thrombus. Normal compressibility. Venous Reflux:  None. Other Findings: There is a mixed echogenic approximately 15.9 x 6.6 x 6.3 cm mass within the subcutaneous tissues which correlates with the patient's palpable area of concern IMPRESSION: 1. No evidence of DVT within the  left lower extremity. 2. Patient's palpable area of concern correlates with an approximately 15.9 cm mixed echogenic subcutaneous mass. This structure is indeterminate on this examination with brought differential considerations including hematoma and abscess, though a definitive solid mass is not excluded on the basis this examination. Clinical correlation is advised. Further evaluation could be performed with MRI as clinically indicated. Electronically Signed   By: Sandi Mariscal M.D.   On: 08/01/2018 14:48   Dg Toe 5th Right  Result Date: 08/01/2018 CLINICAL DATA:  58 year old with an open sore involving the RIGHT fifth toe that began as a blister approximately 1 month ago. No known injury. EXAM: RIGHT FIFTH TOE COMPARISON:  None. FINDINGS: Diffuse soft tissue swelling. No evidence of acute or subacute fracture or dislocation. No radiographic evidence of osteomyelitis. Well-preserved bone mineral density. Well preserved joint spaces. IMPRESSION: No acute or subacute osseous abnormality. Electronically Signed   By: Evangeline Dakin M.D.   On: 08/01/2018 13:37      Subjective: Pain in left lower leg is stable  Discharge Exam: Vitals:   08/01/18 2047 08/01/18 2153 08/02/18 0529 08/02/18 1555  BP:  (!) 141/93 (!) 151/93 132/90  Pulse: 72 63 (!) 55 60  Resp:  20  20 20  Temp:  98.5 F (36.9 C) 97.7 F (36.5 C)   TempSrc:  Oral Oral   SpO2: 96% 99% 97% 96%  Weight:  108.3 kg    Height:  5\' 11"  (1.803 m)      General: Pt is alert, awake, not in acute distress Cardiovascular: RRR, S1/S2 +, no  rubs, no gallops Respiratory: CTA bilaterally, no wheezing, no rhonchi Abdominal: Soft, NT, ND, bowel sounds + Extremities: Swelling and left lower leg, warm to touch    The results of significant diagnostics from this hospitalization (including imaging, microbiology, ancillary and laboratory) are listed below for reference.     Microbiology: Recent Results (from the past 240 hour(s))  SARS Coronavirus 2 [facility] order, Performed in [facility] hospital lab)     Status: None   Collection Time: 08/01/18  8:10 PM   Specimen: Nasopharyngeal Swab  Result Value Ref Range Status   SARS Coronavirus 2 NEGATIVE NEGATIVE Final    Comment: (NOTE) If result is NEGATIVE SARS-CoV-2 target nucleic acids are NOT DETECTED. The SARS-CoV-2 RNA is generally detectable in upper and lower  respiratory specimens during the acute phase of infection. The lowest  concentration of SARS-CoV-2 viral copies this assay can detect is 250  copies / mL. A negative result does not preclude SARS-CoV-2 infection  and should not be used as the sole basis for treatment or other  patient management decisions.  A negative result may occur with  improper specimen collection / handling, submission of specimen other  than nasopharyngeal swab, presence of viral mutation(s) within the  areas targeted by this assay, and inadequate number of viral copies  (<250 copies / mL). A negative result must be combined with clinical  observations, patient history, and epidemiological information. If result is POSITIVE SARS-CoV-2 target nucleic acids are DETECTED. The SARS-CoV-2 RNA is generally detectable in upper and lower  respiratory specimens dur ing the acute phase of infection.  Positive  results are indicative of active infection with SARS-CoV-2.  Clinical  correlation with patient history and other diagnostic information is  necessary to determine patient infection status.  Positive results do  not rule out bacterial  infection or co-infection with other viruses. If result is PRESUMPTIVE POSTIVE SARS-CoV-2 nucleic acids MAY BE PRESENT.   A presumptive positive result was obtained on the submitted specimen  and confirmed on repeat testing.  While 2019 novel coronavirus  (SARS-CoV-2) nucleic acids may be present in the submitted sample  additional confirmatory testing may be necessary for epidemiological  and / or clinical management purposes  to differentiate between  SARS-CoV-2 and other Sarbecovirus currently known to infect humans.  If clinically indicated additional testing with an alternate test  methodology [phone]) is advised. The SARS-CoV-2 RNA is generally  detectable in upper and lower respiratory sp ecimens during the acute  phase of infection. The expected result is Negative. Fact Sheet for Patients:  StrictlyIdeas.no Fact Sheet for Healthcare Providers: BankingDealers.co.za This test is not yet approved or cleared by the [place] FDA and has been authorized for detection and/or diagnosis of SARS-CoV-2 by FDA under an Emergency Use Authorization (EUA).  This EUA will remain in effect (meaning this test can be used) for the duration of the COVID-19 declaration under Section 564(b)(1) of the Act, 21 U.S.C. section 360bbb-3(b)(1), unless the authorization is terminated or revoked sooner. Performed at [facility], [address], [place], [place] [address]      Labs: BNP (last 3 results) No results for  input(s): BNP in the last 8760 hours. Basic Metabolic Panel: Recent Labs  Lab 08/01/18 1211 08/02/18 0547  NA 141 138  K 3.8 3.8  CL 108 107  CO2 24 24  GLUCOSE 109* 99  BUN 13 14  CREATININE 0.92 0.79  CALCIUM 9.1 8.6*   Liver Function Tests: No results for input(s): AST, ALT, ALKPHOS, BILITOT, PROT, ALBUMIN in the last 168 hours. No results for input(s): LIPASE, AMYLASE in the last 168 hours. No results for input(s):  AMMONIA in the last 168 hours. CBC: Recent Labs  Lab 08/01/18 1211 08/02/18 0547  WBC 6.0 5.4  NEUTROABS 3.7  --   HGB 15.1 14.3  HCT 45.1 42.7  MCV 94.0 95.5  PLT 255 216   Cardiac Enzymes: No results for input(s): CKTOTAL, CKMB, CKMBINDEX, TROPONINI in the last 168 hours. BNP: Invalid input(s): POCBNP CBG: No results for input(s): GLUCAP in the last 168 hours. D-Dimer No results for input(s): DDIMER in the last 72 hours. Hgb A1c Recent Labs    08/01/18 1211  HGBA1C 5.5   Lipid Profile No results for input(s): CHOL, HDL, LDLCALC, TRIG, CHOLHDL, LDLDIRECT in the last 72 hours. Thyroid function studies No results for input(s): TSH, T4TOTAL, T3FREE, THYROIDAB in the last 72 hours.  Invalid input(s): FREET3 Anemia work up No results for input(s): VITAMINB12, FOLATE, FERRITIN, TIBC, IRON, RETICCTPCT in the last 72 hours. Urinalysis No results found for: COLORURINE, APPEARANCEUR, LABSPEC, [place], GLUCOSEU, [place], [place], KETONESUR, PROTEINUR, UROBILINOGEN, NITRITE, LEUKOCYTESUR Sepsis Labs Invalid input(s): PROCALCITONIN,  WBC,  [place] Microbiology Recent Results (from the past 240 hour(s))  SARS Coronavirus 2 [facility] order, Performed in [facility] hospital lab)     Status: None   Collection Time: 08/01/18  8:10 PM   Specimen: Nasopharyngeal Swab  Result Value Ref Range Status   SARS Coronavirus 2 NEGATIVE NEGATIVE Final    Comment: (NOTE) If result is NEGATIVE SARS-CoV-2 target nucleic acids are NOT DETECTED. The SARS-CoV-2 RNA is generally detectable in upper and lower  respiratory specimens during the acute phase of infection. The lowest  concentration of SARS-CoV-2 viral copies this assay can detect is 250  copies / mL. A negative result does not preclude SARS-CoV-2 infection  and should not be used as the sole basis for treatment or other  patient management decisions.  A negative result may occur with  improper specimen collection / handling,  submission of specimen other  than nasopharyngeal swab, presence of viral mutation(s) within the  areas targeted by this assay, and inadequate number of viral copies  (<250 copies / mL). A negative result must be combined with clinical  observations, patient history, and epidemiological information. If result is POSITIVE SARS-CoV-2 target nucleic acids are DETECTED. The SARS-CoV-2 RNA is generally detectable in upper and lower  respiratory specimens dur ing the acute phase of infection.  Positive  results are indicative of active infection with SARS-CoV-2.  Clinical  correlation with patient history and other diagnostic information is  necessary to determine patient infection status.  Positive results do  not rule out bacterial infection or co-infection with other viruses. If result is PRESUMPTIVE POSTIVE SARS-CoV-2 nucleic acids MAY BE PRESENT.   A presumptive positive result was obtained on the submitted specimen  and confirmed on repeat testing.  While 2019 novel coronavirus  (SARS-CoV-2) nucleic acids may be present in the submitted sample  additional confirmatory testing may be necessary for epidemiological  and / or clinical management purposes  to differentiate between  SARS-CoV-2 and  other Sarbecovirus currently known to infect humans.  If clinically indicated additional testing with an alternate test  methodology [phone]) is advised. The SARS-CoV-2 RNA is generally  detectable in upper and lower respiratory sp ecimens during the acute  phase of infection. The expected result is Negative. Fact Sheet for Patients:  StrictlyIdeas.no Fact Sheet for Healthcare Providers: BankingDealers.co.za This test is not yet approved or cleared by the [place] FDA and has been authorized for detection and/or diagnosis of SARS-CoV-2 by FDA under an Emergency Use Authorization (EUA).  This EUA will remain in effect (meaning this test can be  used) for the duration of the COVID-19 declaration under Section 564(b)(1) of the Act, 21 U.S.C. section 360bbb-3(b)(1), unless the authorization is terminated or revoked sooner. Performed at [facility], [address] [place], [place] [address]      Time coordinating discharge: 17mins  SIGNED:   Kathie Dike, MD  [facility] 08/02/2018, 9:29 PM   If 7PM-7AM, please contact night-coverage [email or web address]

## 2018-08-02 NOTE — Progress Notes (Signed)
IV removed, D/C instructions reviewed with patient, verbalized understanding. Transported to private vehicle via wheelchair.

## 2018-08-02 NOTE — Consult Note (Signed)
Reason for Consult: Left leg cellulitis/mass Referring Physician: Dr. Yetta Glassman R Leonard [name] is an 58 y.o. male.  HPI: Patient is a 58 year old white male who presented to the emergency room with worsening left lower extremity and foot swelling.  He states he has had a lump present over the calf for approximately 4 to 5 weeks.  The swelling recently worsened and he developed left foot swelling.  An ultrasound was performed which revealed no DVT.  He has a 16 cm subcutaneous linear mass present.  It has mixed echogenicity.  It is hard to determine whether this is a hematoma or abscess.  A solid mass has not been ruled out.  Patient states he has some pain with movement.  History reviewed. No pertinent past medical history.  Past Surgical History:  Procedure Laterality Date  . CHOLECYSTECTOMY    . ESOPHAGEAL DILATION N/A 06/27/2017   Procedure: ESOPHAGEAL DILATION;  Surgeon: Danie Binder, MD;  Location: [facility];  Service: Endoscopy;  Laterality: N/A;  . ESOPHAGOGASTRODUODENOSCOPY N/A 06/27/2017   Procedure: ESOPHAGOGASTRODUODENOSCOPY (EGD);  Surgeon: Danie Binder, MD;  Location: [facility];  Service: Endoscopy;  Laterality: N/A;  . knee sx      Family History  Problem Relation Age of Onset  . Colon cancer Neg Hx   . Colon polyps Neg Hx     Social History:  reports that he has quit smoking. He has never used smokeless tobacco. He reports current alcohol use. He reports that he does not use drugs.  Allergies: No Known Allergies  Medications: I have reviewed the patient's current medications.  Results for orders placed or performed during the hospital encounter of 08/01/18 (from the past 48 hour(s))  CBC with Differential     Status: None   Collection Time: 08/01/18 12:11 PM  Result Value Ref Range   WBC 6.0 4.0 - 10.5 K/uL   RBC 4.80 4.22 - 5.81 MIL/uL   Hemoglobin 15.1 13.0 - 17.0 g/dL   HCT 45.1 39.0 - 52.0 %   MCV 94.0 80.0 - 100.0 fL   MCH 31.5 26.0 - 34.0 pg   MCHC  33.5 30.0 - 36.0 g/dL   RDW 12.6 11.5 - 15.5 %   Platelets 255 150 - 400 K/uL   nRBC 0.0 0.0 - 0.2 %   Neutrophils Relative % 62 %   Neutro Abs 3.7 1.7 - 7.7 K/uL   Lymphocytes Relative 21 %   Lymphs Abs 1.3 0.7 - 4.0 K/uL   Monocytes Relative 11 %   Monocytes Absolute 0.6 0.1 - 1.0 K/uL   Eosinophils Relative 5 %   Eosinophils Absolute 0.3 0.0 - 0.5 K/uL   Basophils Relative 1 %   Basophils Absolute 0.0 0.0 - 0.1 K/uL   Immature Granulocytes 0 %   Abs Immature Granulocytes 0.01 0.00 - 0.07 K/uL    Comment: Performed at [facility], [address] [place], [place] [address]  Basic metabolic panel     Status: Abnormal   Collection Time: 08/01/18 12:11 PM  Result Value Ref Range   Sodium 141 135 - 145 mmol/L   Potassium 3.8 3.5 - 5.1 mmol/L   Chloride 108 98 - 111 mmol/L   CO2 24 22 - 32 mmol/L   Glucose, Bld 109 (H) 70 - 99 mg/dL   BUN 13 6 - 20 mg/dL   Creatinine, Ser 0.92 0.61 - 1.24 mg/dL   Calcium 9.1 8.9 - 10.3 mg/dL   GFR calc non Af  Amer >60 >60 mL/min   GFR calc Af Amer >60 >60 mL/min   Anion gap 9 5 - 15    Comment: Performed at [facility], [address], [place], [place] [address]  SARS Coronavirus 2 [facility] order, Performed in [place] hospital lab)     Status: None   Collection Time: 08/01/18  8:10 PM   Specimen: Nasopharyngeal Swab  Result Value Ref Range   SARS Coronavirus 2 NEGATIVE NEGATIVE    Comment: (NOTE) If result is NEGATIVE SARS-CoV-2 target nucleic acids are NOT DETECTED. The SARS-CoV-2 RNA is generally detectable in upper and lower  respiratory specimens during the acute phase of infection. The lowest  concentration of SARS-CoV-2 viral copies this assay can detect is 250  copies / mL. A negative result does not preclude SARS-CoV-2 infection  and should not be used as the sole basis for treatment or other  patient management decisions.  A negative result may occur with  improper specimen collection / handling, submission of specimen other   than nasopharyngeal swab, presence of viral mutation(s) within the  areas targeted by this assay, and inadequate number of viral copies  (<250 copies / mL). A negative result must be combined with clinical  observations, patient history, and epidemiological information. If result is POSITIVE SARS-CoV-2 target nucleic acids are DETECTED. The SARS-CoV-2 RNA is generally detectable in upper and lower  respiratory specimens dur ing the acute phase of infection.  Positive  results are indicative of active infection with SARS-CoV-2.  Clinical  correlation with patient history and other diagnostic information is  necessary to determine patient infection status.  Positive results do  not rule out bacterial infection or co-infection with other viruses. If result is PRESUMPTIVE POSTIVE SARS-CoV-2 nucleic acids MAY BE PRESENT.   A presumptive positive result was obtained on the submitted specimen  and confirmed on repeat testing.  While 2019 novel coronavirus  (SARS-CoV-2) nucleic acids may be present in the submitted sample  additional confirmatory testing may be necessary for epidemiological  and / or clinical management purposes  to differentiate between  SARS-CoV-2 and other Sarbecovirus currently known to infect humans.  If clinically indicated additional testing with an alternate test  methodology [phone]) is advised. The SARS-CoV-2 RNA is generally  detectable in upper and lower respiratory sp ecimens during the acute  phase of infection. The expected result is Negative. Fact Sheet for Patients:  StrictlyIdeas.no Fact Sheet for Healthcare Providers: BankingDealers.co.za This test is not yet approved or cleared by the [place] FDA and has been authorized for detection and/or diagnosis of SARS-CoV-2 by FDA under an Emergency Use Authorization (EUA).  This EUA will remain in effect (meaning this test can be used) for the duration of  the COVID-19 declaration under Section 564(b)(1) of the Act, 21 U.S.C. section 360bbb-3(b)(1), unless the authorization is terminated or revoked sooner. Performed at [facility], [address], [place], [place] [address]   Basic metabolic panel     Status: Abnormal   Collection Time: 08/02/18  5:47 AM  Result Value Ref Range   Sodium 138 135 - 145 mmol/L   Potassium 3.8 3.5 - 5.1 mmol/L   Chloride 107 98 - 111 mmol/L   CO2 24 22 - 32 mmol/L   Glucose, Bld 99 70 - 99 mg/dL   BUN 14 6 - 20 mg/dL   Creatinine, Ser 0.79 0.61 - 1.24 mg/dL   Calcium 8.6 (L) 8.9 - 10.3 mg/dL   GFR calc non Af Amer >  60 >60 mL/min   GFR calc Af Amer >60 >60 mL/min   Anion gap 7 5 - 15    Comment: Performed at [facility], [address] [place], [place] [address]  CBC     Status: None   Collection Time: 08/02/18  5:47 AM  Result Value Ref Range   WBC 5.4 4.0 - 10.5 K/uL   RBC 4.47 4.22 - 5.81 MIL/uL   Hemoglobin 14.3 13.0 - 17.0 g/dL   HCT 42.7 39.0 - 52.0 %   MCV 95.5 80.0 - 100.0 fL   MCH 32.0 26.0 - 34.0 pg   MCHC 33.5 30.0 - 36.0 g/dL   RDW 12.6 11.5 - 15.5 %   Platelets 216 150 - 400 K/uL   nRBC 0.0 0.0 - 0.2 %    Comment: Performed at [facility], [address], [place], [place] [address]    US Venous Img Lower Unilateral Left  Result Date: 08/01/2018 CLINICAL DATA:  Lower extremity pain and edema. Palpable lump involving the left lower extremity for the past 3 weeks. No known injury. EXAM: LEFT LOWER EXTREMITY VENOUS DOPPLER ULTRASOUND TECHNIQUE: Gray-scale sonography with graded compression, as well as color Doppler and duplex ultrasound were performed to evaluate the lower extremity deep venous systems from the level of the common femoral vein and including the common femoral, femoral, profunda femoral, popliteal and calf veins including the posterior tibial, peroneal and gastrocnemius veins when visible. The superficial great saphenous vein was also interrogated. Spectral Doppler was  utilized to evaluate flow at rest and with distal augmentation maneuvers in the common femoral, femoral and popliteal veins. COMPARISON:  None. FINDINGS: Contralateral Common Femoral Vein: Respiratory phasicity is normal and symmetric with the symptomatic side. No evidence of thrombus. Normal compressibility. Common Femoral Vein: No evidence of thrombus. Normal compressibility, respiratory phasicity and response to augmentation. Saphenofemoral Junction: No evidence of thrombus. Normal compressibility and flow on color Doppler imaging. Profunda Femoral Vein: No evidence of thrombus. Normal compressibility and flow on color Doppler imaging. Femoral Vein: No evidence of thrombus. Normal compressibility, respiratory phasicity and response to augmentation. Popliteal Vein: No evidence of thrombus. Normal compressibility, respiratory phasicity and response to augmentation. Calf Veins: No evidence of thrombus. Normal compressibility and flow on color Doppler imaging. Superficial Great Saphenous Vein: No evidence of thrombus. Normal compressibility. Venous Reflux:  None. Other Findings: There is a mixed echogenic approximately 15.9 x 6.6 x 6.3 cm mass within the subcutaneous tissues which correlates with the patient's palpable area of concern IMPRESSION: 1. No evidence of DVT within the  left lower extremity. 2. Patient's palpable area of concern correlates with an approximately 15.9 cm mixed echogenic subcutaneous mass. This structure is indeterminate on this examination with brought differential considerations including hematoma and abscess, though a definitive solid mass is not excluded on the basis this examination. Clinical correlation is advised. Further evaluation could be performed with MRI as clinically indicated. Electronically Signed   By: Sandi Mariscal M.D.   On: 08/01/2018 14:48   Dg Toe 5th Right  Result Date: 08/01/2018 CLINICAL DATA:  57 year old with an open sore involving the RIGHT fifth toe that began as  a blister approximately 1 month ago. No known injury. EXAM: RIGHT FIFTH TOE COMPARISON:  None. FINDINGS: Diffuse soft tissue swelling. No evidence of acute or subacute fracture or dislocation. No radiographic evidence of osteomyelitis. Well-preserved bone mineral density. Well preserved joint spaces. IMPRESSION: No acute or subacute osseous abnormality. Electronically Signed   By: Evangeline Dakin  M.D.   On: 08/01/2018 13:37    ROS:  Pertinent items are noted in HPI.  Blood pressure (!) 151/93, pulse (!) 55, temperature 97.7 F (36.5 C), temperature source Oral, resp. rate 20, height 5\' 11"  (1.803 m), weight 108.3 kg, SpO2 97 %. Physical Exam: Pleasant well-developed well-nourished white male no acute distress Head is normocephalic, atraumatic Lungs clear to auscultation with good breath sounds bilaterally Heart examination reveals regular rate and rhythm without S3, S4, murmurs Left lower extremity examination reveals palpable pedal pulses with pitting edema in the left foot.  A subcutaneous mass with some indistinct margins are present over the calf.  He does have sensation in the foot, but his mobility is somewhat limited secondary to the edema.  Ultrasound report reviewed  Assessment/Plan: Impression: Left lower extremity swelling, question cellulitis, abscess, hematoma, or subcutaneous mass Plan: Agree with plan for MRI.  Will get orthopedic consultation. Leonard [name] 08/02/2018, 9:24 AM

## 2018-08-03 LAB — HIV ANTIBODY (ROUTINE TESTING W REFLEX): HIV Screen 4th Generation wRfx: NONREACTIVE

## 2018-08-09 DIAGNOSIS — R1319 Other dysphagia: Secondary | ICD-10-CM

## 2018-08-16 DIAGNOSIS — C4922 Malignant neoplasm of connective and soft tissue of left lower limb, including hip: Secondary | ICD-10-CM | POA: Insufficient documentation

## 2018-12-27 DIAGNOSIS — Z4789 Encounter for other orthopedic aftercare: Secondary | ICD-10-CM | POA: Insufficient documentation

## 2019-04-24 ENCOUNTER — Encounter (HOSPITAL_COMMUNITY): Payer: Self-pay

## 2019-04-24 ENCOUNTER — Encounter (HOSPITAL_COMMUNITY): Payer: Self-pay | Admitting: *Deleted

## 2019-04-24 ENCOUNTER — Other Ambulatory Visit: Payer: Self-pay

## 2019-04-24 ENCOUNTER — Emergency Department (HOSPITAL_COMMUNITY)
Admission: EM | Admit: 2019-04-24 | Discharge: 2019-04-24 | Disposition: A | Discharge status: Home / Self Care | Payer: 59 | Attending: Emergency Medicine | Admitting: Emergency Medicine

## 2019-04-24 ENCOUNTER — Emergency Department (HOSPITAL_COMMUNITY): Payer: 59

## 2019-04-24 DIAGNOSIS — C7800 Secondary malignant neoplasm of unspecified lung: Secondary | ICD-10-CM | POA: Diagnosis not present

## 2019-04-24 DIAGNOSIS — R05 Cough: Secondary | ICD-10-CM | POA: Insufficient documentation

## 2019-04-24 DIAGNOSIS — C499 Malignant neoplasm of connective and soft tissue, unspecified: Secondary | ICD-10-CM | POA: Diagnosis not present

## 2019-04-24 DIAGNOSIS — R059 Cough, unspecified: Secondary | ICD-10-CM

## 2019-04-24 DIAGNOSIS — R0602 Shortness of breath: Secondary | ICD-10-CM | POA: Insufficient documentation

## 2019-04-24 DIAGNOSIS — R9341 Abnormal radiologic findings on diagnostic imaging of renal pelvis, ureter, or bladder: Secondary | ICD-10-CM | POA: Insufficient documentation

## 2019-04-24 HISTORY — DX: Malignant (primary) neoplasm, unspecified: C80.1

## 2019-04-24 LAB — LIPASE, BLOOD: Lipase: 26 U/L (ref 11–51)

## 2019-04-24 LAB — COMPREHENSIVE METABOLIC PANEL
ALT: 25 U/L (ref 0–44)
AST: 18 U/L (ref 15–41)
Albumin: 4 g/dL (ref 3.5–5.0)
Alkaline Phosphatase: 125 U/L (ref 38–126)
Anion gap: 11 (ref 5–15)
BUN: 15 mg/dL (ref 6–20)
CO2: 24 mmol/L (ref 22–32)
Calcium: 9.1 mg/dL (ref 8.9–10.3)
Chloride: 102 mmol/L (ref 98–111)
Creatinine, Ser: 0.99 mg/dL (ref 0.61–1.24)
GFR calc Af Amer: >60 mL/min (ref >60)
GFR calc non Af Amer: >60 mL/min (ref >60)
Glucose, Bld: 135 mg/dL — ABNORMAL HIGH (ref 70–99)
Potassium: 3.9 mmol/L (ref 3.5–5.1)
Sodium: 137 mmol/L (ref 135–145)
Total Bilirubin: 0.8 mg/dL (ref 0.3–1.2)
Total Protein: 7.7 g/dL (ref 6.5–8.1)

## 2019-04-24 LAB — CBC WITH DIFFERENTIAL/PLATELET
Abs Immature Granulocytes: 0.03 10*3/uL (ref 0.00–0.07)
Basophils Absolute: 0 10*3/uL (ref 0.0–0.1)
Basophils Relative: 0 %
Eosinophils Absolute: 0.3 10*3/uL (ref 0.0–0.5)
Eosinophils Relative: 4 %
HCT: 45.4 % (ref 39.0–52.0)
Hemoglobin: 15 g/dL (ref 13.0–17.0)
Immature Granulocytes: 0 %
Lymphocytes Relative: 14 %
Lymphs Abs: 1.2 10*3/uL (ref 0.7–4.0)
MCH: 31 pg (ref 26.0–34.0)
MCHC: 33 g/dL (ref 30.0–36.0)
MCV: 93.8 fL (ref 80.0–100.0)
Monocytes Absolute: 0.7 10*3/uL (ref 0.1–1.0)
Monocytes Relative: 9 %
Neutro Abs: 6 10*3/uL (ref 1.7–7.7)
Neutrophils Relative %: 73 %
Platelets: 333 10*3/uL (ref 150–400)
RBC: 4.84 MIL/uL (ref 4.22–5.81)
RDW: 13 % (ref 11.5–15.5)
WBC: 8.3 10*3/uL (ref 4.0–10.5)
nRBC: 0 % (ref 0.0–0.2)

## 2019-04-24 LAB — LACTATE DEHYDROGENASE: LDH: 126 U/L (ref 98–192)

## 2019-04-24 MED ORDER — IOHEXOL 350 MG/ML SOLN
100.0000 mL | Freq: Once | INTRAVENOUS | Status: AC | PRN
  Administered 2019-04-24: 100 mL via INTRAVENOUS

## 2019-04-24 MED ORDER — BENZONATATE 100 MG PO CAPS: 200.0000 mg | ORAL_CAPSULE | Freq: Three times a day (TID) | ORAL | 0 refills | Status: DC | PRN

## 2019-04-24 MED ORDER — BENZONATATE 100 MG PO CAPS
200.0000 mg | ORAL_CAPSULE | Freq: Once | ORAL | Status: AC
  Administered 2019-04-24: 200 mg via ORAL
  Filled 2019-04-24: qty 2

## 2019-04-24 NOTE — ED Triage Notes (Addendum)
Pt reports cough has one week. Cough is dry and reports SOB with exertion. Pt has hx of cancer last year unsure of name. Treated at [facility]

## 2019-04-24 NOTE — ED Provider Notes (Signed)
[facility] EMERGENCY DEPARTMENT Provider Note   CSN: [id number] Arrival date & time: 04/24/19  1007     History Chief Complaint  Patient presents with   Cough    Leonard [name] is a 59 y.o. 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  He denies fevers, chills, chest pain and has had no exposures to Covid, having undergone his first vaccine last week. He reports good appetite, no unexplained weight loss, activity level has been good.  He denies peripheral edema, wheezing, n/v or other complaints. No orthopnea.  He has been taking nyquill and cough drops with improvement in his symptoms.   He is mostly concerned about the possibility of cancer metastasis as he was told this cancer can spread to the lungs.  He is currently newly insured with a medical insurance plan that does not cover [facility], so is re establishing care with Dr. Nevada Crane here in [place] next week but will be unable to continue his oncology care at [facility].    The history is provided by the patient.  Cough Associated symptoms: shortness of breath   Associated symptoms: no chest pain, no chills, no fever, no headaches and no rash        Past Medical History:  Diagnosis Date   Cancer [facility])     Patient Active Problem List   Diagnosis Date Noted   Leg mass, left 08/02/2018   Localized swelling of left lower leg 08/01/2018   Food impaction of esophagus 06/27/2017   NSAID induced gastritis    Esophageal dysphagia     Past Surgical History:  Procedure Laterality Date   CHOLECYSTECTOMY     ESOPHAGEAL DILATION N/A 06/27/2017   Procedure: ESOPHAGEAL DILATION;  Surgeon: Danie Binder, MD;  Location: [facility] ENDO SUITE;  Service: Endoscopy;  Laterality: N/A;   ESOPHAGOGASTRODUODENOSCOPY N/A  06/27/2017   Procedure: ESOPHAGOGASTRODUODENOSCOPY (EGD);  Surgeon: Danie Binder, MD;  Location: [facility] ENDO SUITE;  Service: Endoscopy;  Laterality: N/A;   knee sx         Family History  Problem Relation Age of Onset   Colon cancer Neg Hx    Colon polyps Neg Hx     Social History   Tobacco Use   Smoking status: Former Smoker   Smokeless tobacco: Never Used  Substance Use Topics   Alcohol use: Yes   Drug use: Never    Home Medications Prior to Admission medications   Medication Sig Start Date End Date Taking? Authorizing Provider  Pseudoeph-Doxylamine-DM-APAP (DAYQUIL/NYQUIL COLD/FLU RELIEF PO) Take 30 mLs by mouth in the morning and at bedtime. Takes 76ml of dayquil each morning and 42ml of nyquil at bedtime.   Yes [provider]  traMADol (ULTRAM) 50 MG tablet Take 1 tablet (50 mg total) by mouth every 6 (six) hours as needed. Patient not taking: Reported on 04/24/2019 08/02/18   Kathie Dike, MD    Allergies    Patient has no known allergies.  Review of Systems   Review of Systems  Constitutional: Negative for appetite change, chills, fever and unexpected weight change.  HENT: Negative.   Eyes: Negative.   Respiratory: Positive for shortness of breath. Negative for chest tightness.   Cardiovascular: Negative for chest pain.  Gastrointestinal: Negative for abdominal pain, nausea and vomiting.  Genitourinary: Negative.  Musculoskeletal: Negative for arthralgias, joint swelling and neck pain.  Skin: Negative.  Negative for rash and wound.  Neurological: Negative for dizziness, weakness, light-headedness, numbness and headaches.  Psychiatric/Behavioral: Negative.     Physical Exam Updated Vital Signs BP (!) 146/91 (BP Location: Right Arm)    Pulse 85    Temp 98.2 F (36.8 C) (Oral)    Resp 18    Ht 5\' 11"  (1.803 m)    Wt 113.4 kg    BMI 34.87 kg/m   Physical Exam Vitals and nursing note reviewed.  Constitutional:      Appearance: He is  well-developed.  HENT:     Head: Normocephalic and atraumatic.  Eyes:     Conjunctiva/sclera: Conjunctivae normal.  Cardiovascular:     Rate and Rhythm: Normal rate and regular rhythm.     Pulses: Normal pulses.     Heart sounds: Normal heart sounds. No murmur.  Pulmonary:     Effort: Pulmonary effort is normal. No respiratory distress.     Breath sounds: Normal breath sounds. No stridor. No wheezing or rhonchi.  Abdominal:     General: Bowel sounds are normal.     Palpations: Abdomen is soft.     Tenderness: There is no abdominal tenderness.  Musculoskeletal:        General: Normal range of motion.     Cervical back: Normal range of motion.     Comments: Post op changes noted left calf, well healed.  Chronic reduced ROM of the left ankle since surgery.   Skin:    General: Skin is warm and dry.  Neurological:     Mental Status: He is alert.     ED Results / Procedures / Treatments   Labs (all labs ordered are listed, but only abnormal results are displayed) Labs Reviewed  COMPREHENSIVE METABOLIC PANEL - Abnormal; Notable for the following components:      Result Value   Glucose, Bld 135 (*)    All other components within normal limits  CBC WITH DIFFERENTIAL/PLATELET  LIPASE, BLOOD  LACTATE DEHYDROGENASE    EKG None  Radiology DG Chest 2 View  Result Date: 04/24/2019 CLINICAL DATA:  Cough and shortness of breath. EXAM: CHEST - 2 VIEW COMPARISON:  None. FINDINGS: Trachea is midline. Heart is at the upper limits of normal in size to mildly enlarged. There are nodules and masses in the lungs bilaterally, measuring up to 4.9 x 5.1 cm in the left upper lobe. There may be mild interstitial prominence as well. No pleural fluid. IMPRESSION: 1. Bilateral pulmonary nodules and masses are indicative of metastatic disease. 2. Possible mild interstitial prominence, of uncertain acuity. Difficult to exclude edema or atypical/viral pneumonia. Electronically Signed   By: Lorin Picket  M.D.   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 Cough and shortness of breath EXAM: CT ANGIOGRAPHY CHEST CT ABDOMEN AND PELVIS WITH CONTRAST TECHNIQUE: Multidetector CT imaging of the chest was performed using the standard protocol during bolus administration of intravenous contrast. Multiplanar CT image reconstructions and MIPs were obtained to evaluate the vascular anatomy. Multidetector CT imaging of the abdomen and pelvis was performed using the standard protocol during bolus administration of intravenous contrast. CONTRAST:  182mL OMNIPAQUE IOHEXOL 350 MG/ML SOLN COMPARISON:  Chest radiograph April 24, 2019 FINDINGS: CTA CHEST FINDINGS Cardiovascular: There is no demonstrable pulmonary embolus. There is no  thoracic aortic aneurysm or dissection. The visualized great vessels appear normal. There are multiple foci coronary artery calcification. There are foci of aortic atherosclerosis. No pericardial effusion or pericardial thickening evident. Mediastinum/Nodes: Thyroid appears somewhat inhomogeneous without dominant mass. There is a lymph node in the left hilum measuring 1.3 x 1.3 cm. There is a lymph node to the left of the carina measuring 1.2 x 1.0 cm. There are several subcentimeter mediastinal lymph nodes, largest measuring 1.0 x 1.0 cm. No esophageal lesions are evident. Lungs/Pleura: Widespread pulmonary metastatic disease noted with multiple nodular opacities seen throughout the lungs. The largest individual nodular lesion is seen in the posterior segment of the left upper lobe abutting the pleura measuring 4.7 x 4.2 cm. Nodular lesions elsewhere range in size from as small as 5 mm to as large as 4.1 x 3.2 cm. This nodular opacity is noted in the inferior lingula. There is no airspace consolidation or edema. No pleural effusions are evident.  Musculoskeletal: There are foci of degenerative change in the thoracic spine. There are no blastic or lytic bone lesions. No chest wall lesions evident. Review of the MIP images confirms the above findings. CT ABDOMEN and PELVIS FINDINGS Hepatobiliary: There is hepatic steatosis. No focal liver lesions are demonstrable. Gallbladder wall is not appreciably thickened. There is no biliary duct dilatation. Pancreas: There is no pancreatic mass or inflammatory focus. Spleen: No splenic lesions are evident. Adrenals/Urinary Tract: Adrenals bilaterally appear normal. There is a mass arising in the periphery of the upper pole left kidney measuring 1.3 x 1.4 cm. This mass has attenuation values higher than is expected with a cyst. There is an 8 x 8 mm cyst in the lower pole the left kidney. There is no evident hydronephrosis on either side. There is no evident renal or ureteral calculus on either side. Urinary bladder is midline and essentially completely empty. Stomach/Bowel: There is no appreciable bowel wall or mesenteric thickening. There is no evident bowel obstruction. The terminal ileum appears unremarkable. There is fatty infiltration in the ileocecal valve. Vascular/Lymphatic: There is aortic atherosclerosis. No aneurysm evident. Major mesenteric arterial vessels are patent. Major venous structures are patent. There is no appreciable adenopathy in the abdomen or pelvis. Reproductive: Prostate and seminal vesicles are normal in size and contour. No pelvic mass evident. Other: Appendix appears normal. There is no evident ascites or abscess in the abdomen or pelvis. There is mild fat in the umbilicus. Musculoskeletal: There are no blastic or lytic bone lesions. No intramuscular lesions are evident. Surgical clips are noted in the posterior right pelvic wall and involving the right gluteus maximus muscle. Review of the MIP images confirms the above findings. IMPRESSION: 1. No demonstrable pulmonary embolus. No thoracic  aortic aneurysm or dissection. There is aortic atherosclerosis as well as multiple foci of coronary artery calcification. 2. Widespread pulmonary metastatic lesions throughout the lungs involving most lobes in segments. Largest individual masses in the posterior segment of the left upper lobe abutting the pleura measuring 4.7 x 4.2 cm. A mass in the inferior lingula measures 4.1 x 3.2 cm. No edema or airspace opacity. 3. Several borderline prominent lymph nodes, likely of neoplastic etiology. CT abdomen and pelvis: 1. There is a mass arising in the upper pole left kidney measuring 1.3 x 1.4 cm which has attenuation values higher than is expected with a cyst. Question potential left renal metastasis or possibly small primary left renal carcinoma. 2.  No adenopathy in the abdomen or pelvis. 3. No bowel wall  thickening or bowel obstruction. No abscess in the abdomen or pelvis. Appendix appears normal. 4. No renal or ureteral calculus. No hydronephrosis. Urinary bladder empty. 5.  Hepatic steatosis.  No focal liver lesions are evident. 6.  Gallbladder absent. 7.  Aortic atherosclerosis. Aortic Atherosclerosis (ICD10-I70.0). Electronically Signed   By: Lowella Grip III M.D.   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 Cough and shortness of breath EXAM: CT ANGIOGRAPHY CHEST CT ABDOMEN AND PELVIS WITH CONTRAST TECHNIQUE: Multidetector CT imaging of the chest was performed using the standard protocol during bolus administration of intravenous contrast. Multiplanar CT image reconstructions and MIPs were obtained to evaluate the vascular anatomy. Multidetector CT imaging of the abdomen and pelvis was performed using the standard protocol during bolus administration of intravenous contrast. CONTRAST:  128mL OMNIPAQUE IOHEXOL 350 MG/ML SOLN COMPARISON:  Chest radiograph April 24, 2019 FINDINGS: CTA CHEST FINDINGS Cardiovascular: There is no demonstrable pulmonary embolus. There is no thoracic aortic aneurysm or dissection. The visualized great vessels appear normal. There are multiple foci coronary artery calcification. There are foci of aortic atherosclerosis. No pericardial effusion or pericardial thickening evident. Mediastinum/Nodes: Thyroid appears somewhat inhomogeneous without dominant mass. There is a lymph node in the left hilum measuring 1.3 x 1.3 cm. There is a lymph node to the left of the carina measuring 1.2 x 1.0 cm. There are several subcentimeter mediastinal lymph nodes, largest measuring 1.0 x 1.0 cm. No esophageal lesions are evident. Lungs/Pleura: Widespread pulmonary metastatic disease noted with multiple nodular opacities seen throughout the lungs. The largest individual nodular lesion is seen in the posterior segment of the left upper lobe abutting the pleura measuring 4.7 x 4.2 cm. Nodular lesions elsewhere range in size from as small as 5 mm to as large as 4.1 x 3.2 cm. This nodular opacity is noted in the inferior lingula. There is no airspace consolidation or edema. No pleural effusions are evident. Musculoskeletal: There are foci of degenerative change in the thoracic spine. There are no blastic or lytic bone lesions. No chest wall lesions evident. Review of the MIP images confirms the above findings. CT ABDOMEN and PELVIS FINDINGS Hepatobiliary: There is hepatic steatosis. No focal liver lesions are demonstrable. Gallbladder wall is not appreciably thickened. There is no biliary duct dilatation. Pancreas: There is no pancreatic mass or inflammatory focus. Spleen: No splenic lesions are evident. Adrenals/Urinary Tract: Adrenals bilaterally appear normal. There is a mass arising in the periphery of the upper pole left kidney measuring 1.3 x 1.4 cm. This mass has attenuation values higher than is expected with a cyst. There is an 8 x 8 mm cyst in the lower pole  the left kidney. There is no evident hydronephrosis on either side. There is no evident renal or ureteral calculus on either side. Urinary bladder is midline and essentially completely empty. Stomach/Bowel: There is no appreciable bowel wall or mesenteric thickening. There is no evident bowel obstruction. The terminal ileum appears unremarkable. There is fatty infiltration in the ileocecal valve. Vascular/Lymphatic: There is aortic atherosclerosis. No aneurysm evident. Major mesenteric arterial vessels are patent. Major venous structures are patent. There is no appreciable adenopathy in the abdomen or pelvis. Reproductive: Prostate and seminal vesicles are normal in size and contour. No pelvic mass evident. Other: Appendix appears normal. There is no evident ascites or abscess in the abdomen or pelvis. There is mild fat in the  umbilicus. Musculoskeletal: There are no blastic or lytic bone lesions. No intramuscular lesions are evident. Surgical clips are noted in the posterior right pelvic wall and involving the right gluteus maximus muscle. Review of the MIP images confirms the above findings. IMPRESSION: 1. No demonstrable pulmonary embolus. No thoracic aortic aneurysm or dissection. There is aortic atherosclerosis as well as multiple foci of coronary artery calcification. 2. Widespread pulmonary metastatic lesions throughout the lungs involving most lobes in segments. Largest individual masses in the posterior segment of the left upper lobe abutting the pleura measuring 4.7 x 4.2 cm. A mass in the inferior lingula measures 4.1 x 3.2 cm. No edema or airspace opacity. 3. Several borderline prominent lymph nodes, likely of neoplastic etiology. CT abdomen and pelvis: 1. There is a mass arising in the upper pole left kidney measuring 1.3 x 1.4 cm which has attenuation values higher than is expected with a cyst. Question potential left renal metastasis or possibly small primary left renal carcinoma. 2.  No adenopathy in  the abdomen or pelvis. 3. No bowel wall thickening or bowel obstruction. No abscess in the abdomen or pelvis. Appendix appears normal. 4. No renal or ureteral calculus. No hydronephrosis. Urinary bladder empty. 5.  Hepatic steatosis.  No focal liver lesions are evident. 6.  Gallbladder absent. 7.  Aortic atherosclerosis. Aortic Atherosclerosis (ICD10-I70.0). Electronically Signed   By: Lowella Grip III M.D.   On: 04/24/2019 13:57    Procedures Procedures (including critical care time)  Medications Ordered in ED Medications  iohexol (OMNIPAQUE) 350 MG/ML injection 100 mL (100 mLs Intravenous Contrast Given 04/24/19 1248)    ED Course  I have reviewed the triage vital signs and the nursing notes.  Pertinent labs & imaging results that were available during my care of the patient were reviewed by me and considered in my medical decision making (see chart for details).    MDM Rules/Calculators/A&P                     Per pt's last visit with his orthopedic specialist at [facility] 12/27/18: Surveillance plan outlined for pt which would include MRI left lower leg WWO contrast q 6 months, CXR 6 months, and CT CAP annually for 2 years, then MRI/CXR q 6 months x 2 years with CT CAP, then annually  Labs reviewed and unremarkable, LDH pending.  CT imaging results showing extensive metastatic lung disease, possible left renal metastasis. Discussed these finding with pt and wife at bedside.  Pt is comfortable with his breathing, lung exam without wheezing or stridor. VSS.  Pt stable for f/u tomorrow to establish care with Dr Delton Coombes.  Discussed pt with Dr.Katragadda with hem/onc. He requested adding an LDH to his lab tests.  Office appt scheduled for tomorrow 4/13 at 1 pm. Final Clinical Impression(s) / ED Diagnoses Final diagnoses:  Cough  Secondary sarcoma of lung, unspecified laterality [facility])    Rx / DC Orders ED Discharge Orders    None       Landis Martins 04/24/19 1421      Davonna Belling, MD 04/24/19 1517

## 2019-04-24 NOTE — Progress Notes (Signed)
Patient is here at [facility] today in the ER. The ER providers contacted Dr. Delton Coombes to arrange new patient consult.  We have scheduled patient to be seen tomorrow in our clinic for new patient consult.  I attempted to meet with patient down in the ER, but he was out of department for his CT scans.  I provided the PA caring for him a copy of the appointment for tomorrow and my card with my contact information.  She will advise patient to call with any questions before tomorrow.

## 2019-04-25 ENCOUNTER — Inpatient Hospital Stay (HOSPITAL_COMMUNITY): Payer: 59 | Attending: Hematology | Admitting: Hematology

## 2019-04-25 ENCOUNTER — Encounter (HOSPITAL_COMMUNITY): Payer: Self-pay | Admitting: Hematology

## 2019-04-25 VITALS — BP 126/82 | HR 73 | Temp 97.5°F | Resp 19 | Ht 69.0 in | Wt 245.2 lb

## 2019-04-25 DIAGNOSIS — R918 Other nonspecific abnormal finding of lung field: Secondary | ICD-10-CM

## 2019-04-25 DIAGNOSIS — E119 Type 2 diabetes mellitus without complications: Secondary | ICD-10-CM | POA: Insufficient documentation

## 2019-04-25 DIAGNOSIS — R0609 Other forms of dyspnea: Secondary | ICD-10-CM | POA: Insufficient documentation

## 2019-04-25 DIAGNOSIS — C4922 Malignant neoplasm of connective and soft tissue of left lower limb, including hip: Secondary | ICD-10-CM | POA: Diagnosis not present

## 2019-04-25 DIAGNOSIS — R05 Cough: Secondary | ICD-10-CM | POA: Insufficient documentation

## 2019-04-25 DIAGNOSIS — Z87891 Personal history of nicotine dependence: Secondary | ICD-10-CM | POA: Diagnosis not present

## 2019-04-25 DIAGNOSIS — Z79899 Other long term (current) drug therapy: Secondary | ICD-10-CM | POA: Insufficient documentation

## 2019-04-25 DIAGNOSIS — F419 Anxiety disorder, unspecified: Secondary | ICD-10-CM | POA: Diagnosis not present

## 2019-04-25 MED ORDER — ALPRAZOLAM 0.25 MG PO TABS: 0.2500 mg | ORAL_TABLET | Freq: Two times a day (BID) | ORAL | 0 refills | Status: DC | PRN

## 2019-04-25 NOTE — Patient Instructions (Signed)
[place] at [facility] Discharge Instructions  You were seen today by Dr. Delton Coombes. He went over your medical history, family history and how you've been feeling since your ER visit. He will schedule you for a lung biopsy to further evaluate the areas in your lungs. He will also schedule you for a port-a-cath placement. He will schedule you for an Echocardiogram. You will need chemotherapy and Radiation treatments. At this time your Sarcoma can not be cured, it can only be controled and treated. He will see you back after the biopsy for labs and follow up.    Thank you for choosing [place] at [facility] to provide your oncology and hematology care.  To afford each patient quality time with our provider, please arrive at least 15 minutes before your scheduled appointment time.   If you have a lab appointment with the [facility] please come in thru the  Main Entrance and check in at the main information desk  You need to re-schedule your appointment should you arrive 10 or more minutes late.  We strive to give you quality time with our providers, and arriving late affects you and other patients whose appointments are after yours.  Also, if you no show three or more times for appointments you may be dismissed from the clinic at the providers discretion.     Again, thank you for choosing [facility].  Our hope is that these requests will decrease the amount of time that you wait before being seen by our physicians.       _____________________________________________________________  Should you have questions after your visit to [facility], please contact our office at [phone] between the hours of 8:00 a.m. and 4:30 p.m.  Voicemails left after 4:00 p.m. will not be returned until the following business day.  For prescription refill requests, have your pharmacy contact our office and allow 72 hours.    Cancer  Center Support Programs:   > Cancer Support Group  2nd Tuesday of the month 1pm-2pm, Journey Room

## 2019-04-25 NOTE — Progress Notes (Signed)
[facility] CONSULT NOTE  Patient Care Team: Celene Squibb, MD as PCP - General (Internal Medicine) Donetta Potts, RN as Oncology Nurse Navigator (Oncology) [name], MD as Medical Oncologist (Oncology)  CHIEF COMPLAINTS/PURPOSE OF CONSULTATION:  Metastatic sarcoma.  HISTORY OF PRESENTING ILLNESS:  Leonard [name] 59 y.o. 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  He underwent chemoradiation therapy with gemcitabine and docetaxel followed by wide local excision.  Pathology on 11/10/2018 showed liposarcoma, grade 2 with 80% necrosis, positive treatment effect, positive deep margin, and occasional 5 DM2 detected.  He was lost to surveillance as he did not have insurance at that time.  He denied having any fevers, night sweats or weight loss in the last 6 months.  Reported shortness of breath on exertion for the past couple of months.  Reported dry cough for the last 2 weeks.  Also reported severe decrease in energy levels.  He came to the ER on 04/24/2019-chest x-ray was abnormal.  ER provider has contacted me.  A CT of the chest, abdomen and pelvis was ordered.  CT of the chest PE protocol was negative for pulmonary embolism.  However it showed multiple bilateral lung masses with mediastinal and hilar lymph nodes.  Most prominent lung mass was in the left upper lobe, attached to the pleura.  Patient denies any chest pains.  He was given [employer]  He reports appetite of 100%.  Energy levels are 50%.  He has some numbness in the left leg from previous surgery and chemoradiation therapy.  He worked in Architect in the past.  He smoked 1 pack/day of cigarettes for 30 years.  He quit smoking about 6 years ago.  Family history is significant for cancers in maternal uncle, maternal aunt, another  maternal uncle and paternal uncle.  Patient does not know the types of tumors.  Denies any headaches or vision changes.  MEDICAL HISTORY:  Past Medical History:  Diagnosis Date  . Cancer [facility])     SURGICAL HISTORY: Past Surgical History:  Procedure Laterality Date  . CHOLECYSTECTOMY    . ESOPHAGEAL DILATION N/A 06/27/2017   Procedure: ESOPHAGEAL DILATION;  Surgeon: Danie Binder, MD;  Location: [facility];  Service: Endoscopy;  Laterality: N/A;  . ESOPHAGOGASTRODUODENOSCOPY N/A 06/27/2017   Procedure: ESOPHAGOGASTRODUODENOSCOPY (EGD);  Surgeon: Danie Binder, MD;  Location: [facility];  Service: Endoscopy;  Laterality: N/A;  . knee sx      SOCIAL HISTORY: Social History   Socioeconomic History  . Marital status: Married    Spouse name: Not on file  . Number of children: 1  . Years of education: Not on file  . Highest education level: Not on file  Occupational History  . Occupation: Unemployed  Tobacco Use  . Smoking status: Former Research scientist (life sciences)  . Smokeless tobacco: Never Used  Substance and Sexual Activity  . Alcohol use: Yes    Comment: 1-2 drinks per month  . Drug use: Never  . Sexual activity: Not Currently  Other Topics Concern  . Not on file  Social History Narrative   DOES CONSTRUCTION. MARRIED. RARE ETOH. NO TOBACCO.   Social Determinants of Health   Financial Resource Strain: Medium Risk  . Difficulty of Paying Living Expenses: Somewhat hard  Food Insecurity: No Food Insecurity  . Worried About  Running Out of Food in the Last Year: Never true  . Ran Out of Food in the Last Year: Never true  Transportation Needs: No Transportation Needs  . Lack of Transportation (Medical): No  . Lack of Transportation (Non-Medical): No  Physical Activity: Inactive  . Days of Exercise per Week: 0 days  . Minutes of Exercise per Session: 0 min  Stress: Stress Concern Present  . Feeling of Stress : To some extent  Social Connections: Slightly Isolated  . Frequency of  Communication with Friends and Family: More than three times a week  . Frequency of Social Gatherings with Friends and Family: Once a week  . Attends Religious Services: More than 4 times per year  . Active Member of Clubs or Organizations: No  . Attends Archivist Meetings: Never  . Marital Status: Married  Human resources officer Violence: Not At Risk  . Fear of Current or Ex-Partner: No  . Emotionally Abused: No  . Physically Abused: No  . Sexually Abused: No    FAMILY HISTORY: Family History  Problem Relation Age of Onset  . Heart disease Father   . Cancer Maternal Aunt   . Cancer Maternal Uncle   . Cancer Paternal Uncle   . Vision loss Maternal Grandfather   . Colon cancer Neg Hx   . Colon polyps Neg Hx     ALLERGIES:  has No Known Allergies.  MEDICATIONS:  Current Outpatient Medications  Medication Sig Dispense Refill  . ALPRAZolam (XANAX) 0.25 MG tablet Take 1 tablet (0.25 mg total) by mouth 2 (two) times daily as needed for anxiety. 30 tablet 0  . benzonatate (TESSALON) 100 MG capsule Take 2 capsules (200 mg total) by mouth 3 (three) times daily as needed. (Patient not taking: Reported on 04/24/2019) 30 capsule 0  . Pseudoeph-Doxylamine-DM-APAP (DAYQUIL/NYQUIL COLD/FLU RELIEF PO) Take 30 mLs by mouth in the morning and at bedtime. Takes 62ml of dayquil each morning and 50ml of nyquil at bedtime.     No current facility-administered medications for this visit.    REVIEW OF SYSTEMS:   Constitutional: Denies fevers, chills or abnormal night sweats Eyes: Denies blurriness of vision, double vision or watery eyes Ears, nose, mouth, throat, and face: Denies mucositis or sore throat Respiratory: Positive for cough and dyspnea on exertion. Cardiovascular: Denies palpitation, chest discomfort or lower extremity swelling Gastrointestinal:  Denies nausea, heartburn or change in bowel habits Skin: Denies abnormal skin rashes Lymphatics: Denies new lymphadenopathy or easy  bruising Neurological: Positive for numbness in the left leg. Behavioral/Psych: Mood is stable, no new changes  All other systems were reviewed with the patient and are negative.  PHYSICAL EXAMINATION: ECOG PERFORMANCE STATUS: 1 - Symptomatic but completely ambulatory  Vitals:   04/25/19 1304  BP: 126/82  Pulse: 73  Resp: 19  Temp: (!) 97.5 F (36.4 C)  SpO2: 97%   Filed Weights   04/25/19 1304  Weight: 245 lb 3.2 oz (111.2 kg)    GENERAL:alert, no distress and comfortable SKIN: skin color, texture, turgor are normal, no rashes or significant lesions EYES: normal, conjunctiva are pink and non-injected, sclera clear OROPHARYNX:no exudate, no erythema and lips, buccal mucosa, and tongue normal  NECK: supple, thyroid normal size, non-tender, without nodularity LYMPH:  no palpable lymphadenopathy in the cervical, axillary or inguinal LUNGS: clear to auscultation and percussion with normal breathing effort HEART: regular rate & rhythm and no murmurs and no lower extremity edema.  The left leg with postsurgical changes. ABDOMEN:abdomen soft, non-tender  and normal bowel sounds Musculoskeletal:no cyanosis of digits and no clubbing  PSYCH: alert & oriented x 3 with fluent speech NEURO: no focal motor/sensory deficits  LABORATORY DATA:  I have reviewed the data as listed Lab Results  Component Value Date   WBC 8.3 04/24/2019   HGB 15.0 04/24/2019   HCT 45.4 04/24/2019   MCV 93.8 04/24/2019   PLT 333 04/24/2019     Chemistry      Component Value Date/Time   NA 137 04/24/2019 1151   K 3.9 04/24/2019 1151   CL 102 04/24/2019 1151   CO2 24 04/24/2019 1151   BUN 15 04/24/2019 1151   CREATININE 0.99 04/24/2019 1151      Component Value Date/Time   CALCIUM 9.1 04/24/2019 1151   ALKPHOS 125 04/24/2019 1151   AST 18 04/24/2019 1151   ALT 25 04/24/2019 1151   BILITOT 0.8 04/24/2019 1151       RADIOGRAPHIC STUDIES: I have personally reviewed the radiological images as  listed and agreed with the findings in the report. DG Chest 2 View  Result Date: 04/24/2019 CLINICAL DATA:  Cough and shortness of breath. EXAM: CHEST - 2 VIEW COMPARISON:  None. FINDINGS: Trachea is midline. Heart is at the upper limits of normal in size to mildly enlarged. There are nodules and masses in the lungs bilaterally, measuring up to 4.9 x 5.1 cm in the left upper lobe. There may be mild interstitial prominence as well. No pleural fluid. IMPRESSION: 1. Bilateral pulmonary nodules and masses are indicative of metastatic disease. 2. Possible mild interstitial prominence, of uncertain acuity. Difficult to exclude edema or atypical/viral pneumonia. Electronically Signed   By: Lorin Picket M.D.   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 Cough and shortness of breath EXAM: CT ANGIOGRAPHY CHEST CT ABDOMEN AND PELVIS WITH CONTRAST TECHNIQUE: Multidetector CT imaging of the chest was performed using the standard protocol during bolus administration of intravenous contrast. Multiplanar CT image reconstructions and MIPs were obtained to evaluate the vascular anatomy. Multidetector CT imaging of the abdomen and pelvis was performed using the standard protocol during bolus administration of intravenous contrast. CONTRAST:  15mL OMNIPAQUE IOHEXOL 350 MG/ML SOLN COMPARISON:  Chest radiograph April 24, 2019 FINDINGS: CTA CHEST FINDINGS Cardiovascular: There is no demonstrable pulmonary embolus. There is no thoracic aortic aneurysm or dissection. The visualized great vessels appear normal. There are multiple foci coronary artery calcification. There are foci of aortic atherosclerosis. No pericardial effusion or pericardial thickening evident. Mediastinum/Nodes: Thyroid appears somewhat inhomogeneous without dominant mass. There is a lymph node in the left hilum  measuring 1.3 x 1.3 cm. There is a lymph node to the left of the carina measuring 1.2 x 1.0 cm. There are several subcentimeter mediastinal lymph nodes, largest measuring 1.0 x 1.0 cm. No esophageal lesions are evident. Lungs/Pleura: Widespread pulmonary metastatic disease noted with multiple nodular opacities seen throughout the lungs. The largest individual nodular lesion is seen in the posterior segment of the left upper lobe abutting the pleura measuring 4.7 x 4.2 cm. Nodular lesions elsewhere range in size from as small as 5 mm to as large as 4.1 x 3.2 cm. This nodular opacity is noted in the inferior lingula. There is no airspace consolidation or edema. No pleural effusions are evident. Musculoskeletal: There are foci of degenerative change in the thoracic spine. There are no blastic  or lytic bone lesions. No chest wall lesions evident. Review of the MIP images confirms the above findings. CT ABDOMEN and PELVIS FINDINGS Hepatobiliary: There is hepatic steatosis. No focal liver lesions are demonstrable. Gallbladder wall is not appreciably thickened. There is no biliary duct dilatation. Pancreas: There is no pancreatic mass or inflammatory focus. Spleen: No splenic lesions are evident. Adrenals/Urinary Tract: Adrenals bilaterally appear normal. There is a mass arising in the periphery of the upper pole left kidney measuring 1.3 x 1.4 cm. This mass has attenuation values higher than is expected with a cyst. There is an 8 x 8 mm cyst in the lower pole the left kidney. There is no evident hydronephrosis on either side. There is no evident renal or ureteral calculus on either side. Urinary bladder is midline and essentially completely empty. Stomach/Bowel: There is no appreciable bowel wall or mesenteric thickening. There is no evident bowel obstruction. The terminal ileum appears unremarkable. There is fatty infiltration in the ileocecal valve. Vascular/Lymphatic: There is aortic atherosclerosis. No aneurysm  evident. Major mesenteric arterial vessels are patent. Major venous structures are patent. There is no appreciable adenopathy in the abdomen or pelvis. Reproductive: Prostate and seminal vesicles are normal in size and contour. No pelvic mass evident. Other: Appendix appears normal. There is no evident ascites or abscess in the abdomen or pelvis. There is mild fat in the umbilicus. Musculoskeletal: There are no blastic or lytic bone lesions. No intramuscular lesions are evident. Surgical clips are noted in the posterior right pelvic wall and involving the right gluteus maximus muscle. Review of the MIP images confirms the above findings. IMPRESSION: 1. No demonstrable pulmonary embolus. No thoracic aortic aneurysm or dissection. There is aortic atherosclerosis as well as multiple foci of coronary artery calcification. 2. Widespread pulmonary metastatic lesions throughout the lungs involving most lobes in segments. Largest individual masses in the posterior segment of the left upper lobe abutting the pleura measuring 4.7 x 4.2 cm. A mass in the inferior lingula measures 4.1 x 3.2 cm. No edema or airspace opacity. 3. Several borderline prominent lymph nodes, likely of neoplastic etiology. CT abdomen and pelvis: 1. There is a mass arising in the upper pole left kidney measuring 1.3 x 1.4 cm which has attenuation values higher than is expected with a cyst. Question potential left renal metastasis or possibly small primary left renal carcinoma. 2.  No adenopathy in the abdomen or pelvis. 3. No bowel wall thickening or bowel obstruction. No abscess in the abdomen or pelvis. Appendix appears normal. 4. No renal or ureteral calculus. No hydronephrosis. Urinary bladder empty. 5.  Hepatic steatosis.  No focal liver lesions are evident. 6.  Gallbladder absent. 7.  Aortic atherosclerosis. Aortic Atherosclerosis (ICD10-I70.0). Electronically Signed   By: Lowella Grip III M.D.   On: 04/24/2019 13:57   CT ABDOMEN PELVIS W  CONTRAST  Result Date: 04/24/2019 CLINICAL DATA:  History of undifferentiated pleomorphic sarcoma with apparent pulmonary metastatic disease. Cough and shortness of breath EXAM: CT ANGIOGRAPHY CHEST CT ABDOMEN AND PELVIS WITH CONTRAST TECHNIQUE: Multidetector CT imaging of the chest was performed using the standard protocol during bolus administration of intravenous contrast. Multiplanar CT image reconstructions and MIPs were obtained to evaluate the vascular anatomy. Multidetector CT imaging of the abdomen and pelvis was performed using the standard protocol during bolus administration of intravenous contrast. CONTRAST:  141mL OMNIPAQUE IOHEXOL 350 MG/ML SOLN COMPARISON:  Chest radiograph April 24, 2019 FINDINGS: CTA CHEST FINDINGS Cardiovascular: There is no demonstrable pulmonary embolus. There is  no thoracic aortic aneurysm or dissection. The visualized great vessels appear normal. There are multiple foci coronary artery calcification. There are foci of aortic atherosclerosis. No pericardial effusion or pericardial thickening evident. Mediastinum/Nodes: Thyroid appears somewhat inhomogeneous without dominant mass. There is a lymph node in the left hilum measuring 1.3 x 1.3 cm. There is a lymph node to the left of the carina measuring 1.2 x 1.0 cm. There are several subcentimeter mediastinal lymph nodes, largest measuring 1.0 x 1.0 cm. No esophageal lesions are evident. Lungs/Pleura: Widespread pulmonary metastatic disease noted with multiple nodular opacities seen throughout the lungs. The largest individual nodular lesion is seen in the posterior segment of the left upper lobe abutting the pleura measuring 4.7 x 4.2 cm. Nodular lesions elsewhere range in size from as small as 5 mm to as large as 4.1 x 3.2 cm. This nodular opacity is noted in the inferior lingula. There is no airspace consolidation or edema. No pleural effusions are evident. Musculoskeletal: There are foci of degenerative change in the  thoracic spine. There are no blastic or lytic bone lesions. No chest wall lesions evident. Review of the MIP images confirms the above findings. CT ABDOMEN and PELVIS FINDINGS Hepatobiliary: There is hepatic steatosis. No focal liver lesions are demonstrable. Gallbladder wall is not appreciably thickened. There is no biliary duct dilatation. Pancreas: There is no pancreatic mass or inflammatory focus. Spleen: No splenic lesions are evident. Adrenals/Urinary Tract: Adrenals bilaterally appear normal. There is a mass arising in the periphery of the upper pole left kidney measuring 1.3 x 1.4 cm. This mass has attenuation values higher than is expected with a cyst. There is an 8 x 8 mm cyst in the lower pole the left kidney. There is no evident hydronephrosis on either side. There is no evident renal or ureteral calculus on either side. Urinary bladder is midline and essentially completely empty. Stomach/Bowel: There is no appreciable bowel wall or mesenteric thickening. There is no evident bowel obstruction. The terminal ileum appears unremarkable. There is fatty infiltration in the ileocecal valve. Vascular/Lymphatic: There is aortic atherosclerosis. No aneurysm evident. Major mesenteric arterial vessels are patent. Major venous structures are patent. There is no appreciable adenopathy in the abdomen or pelvis. Reproductive: Prostate and seminal vesicles are normal in size and contour. No pelvic mass evident. Other: Appendix appears normal. There is no evident ascites or abscess in the abdomen or pelvis. There is mild fat in the umbilicus. Musculoskeletal: There are no blastic or lytic bone lesions. No intramuscular lesions are evident. Surgical clips are noted in the posterior right pelvic wall and involving the right gluteus maximus muscle. Review of the MIP images confirms the above findings. IMPRESSION: 1. No demonstrable pulmonary embolus. No thoracic aortic aneurysm or dissection. There is aortic atherosclerosis  as well as multiple foci of coronary artery calcification. 2. Widespread pulmonary metastatic lesions throughout the lungs involving most lobes in segments. Largest individual masses in the posterior segment of the left upper lobe abutting the pleura measuring 4.7 x 4.2 cm. A mass in the inferior lingula measures 4.1 x 3.2 cm. No edema or airspace opacity. 3. Several borderline prominent lymph nodes, likely of neoplastic etiology. CT abdomen and pelvis: 1. There is a mass arising in the upper pole left kidney measuring 1.3 x 1.4 cm which has attenuation values higher than is expected with a cyst. Question potential left renal metastasis or possibly small primary left renal carcinoma. 2.  No adenopathy in the abdomen or pelvis. 3. No bowel  wall thickening or bowel obstruction. No abscess in the abdomen or pelvis. Appendix appears normal. 4. No renal or ureteral calculus. No hydronephrosis. Urinary bladder empty. 5.  Hepatic steatosis.  No focal liver lesions are evident. 6.  Gallbladder absent. 7.  Aortic atherosclerosis. Aortic Atherosclerosis (ICD10-I70.0). Electronically Signed   By: Lowella Grip III M.D.   On: 04/24/2019 13:57    ASSESSMENT & PLAN:  Liposarcoma of left lower extremity ([place]) 1.  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 -He underwent chemoradiation therapy with gemcitabine and docetaxel followed by wide local excision. -Pathology on 11/10/2018 shows liposarcoma with 80% necrosis, grade 2, YPT4NX, positive deep margin, amplification of MDM2  detected.  Amplification is associated with well-differentiated liposarcoma/atypical lipomatous tumors and dedifferentiated liposarcoma. -Patient did not follow-up at [facility] for surveillance because of insurance reasons. -He denies any fevers, night sweats or weight loss in the last 6  months.  However he reported severe loss of energy. -Also reported shortness of breath on exertion.  He developed cough which is dry for the last 2 weeks. -He went to the ER on 04/24/2019.  Chest x-ray showed abnormal lung masses. -ER contacted me and I have told him to order CT of the chest, abdomen and pelvis. -We reviewed CT scan of the chest which showed multiple lung masses, largest in the posterior part of the left upper lobe.  There is also adenopathy in the mediastinum and hilum.  There is a left upper pole hypodense lesion concerning for renal cell carcinoma. -I have also compared with CT scans done at [facility] in August 2020.  The left upper pole lesion was present at that time. -I will order bone scan and 2D echocardiogram.  I have also recommended biopsy of the left upper lobe lung mass. -We will have a port placed by IR.  As liposarcoma is anthracycline sensitive tumor, we will consider first-line standard of care treatment with doxorubicin with ifosfamide. -I will also reach out to sarcoma specialist at [facility].  2.  Dry cough: -He complains of having dry cough for the last couple of weeks.  Also reported shortness of breath on exertion. -I think this is related to irritation from the tumor. -He was given Ladona Ridgel in the ER which he will use.  If no improvement, he will try Robitussin-DM.  If it is not helping, will give some codeine-based cough syrup.  3.  Anxiety: -I will start him on Xanax 0.25 mg 2-3 times a day as needed.  Orders Placed This Encounter  Procedures  . CT Biopsy    Standing Status:   Future    Standing Expiration Date:   04/24/2020    Order Specific Question:   Lab orders requested (DO NOT place separate lab orders, these will be automatically ordered during procedure specimen collection):    Answer:   Surgical Pathology    Order Specific Question:   Reason for Exam (SYMPTOM  OR DIAGNOSIS REQUIRED)    Answer:   Hx left leg Sarcoma, Left lung  masses    Order Specific Question:   Preferred location?    Answer:   [facility]    Order Specific Question:   Radiology Contrast Protocol - do NOT remove file path    Answer:   \\[email or web address]  . IR IMAGING GUIDED PORT INSERTION    Standing Status:   Future  Standing Expiration Date:   06/24/2020    Order Specific Question:   Reason for Exam (SYMPTOM  OR DIAGNOSIS REQUIRED)    Answer:   Chemotherapy    Order Specific Question:   Preferred Imaging Location?    Answer:   [facility]    Standing Status:   Future    Standing Expiration Date:   04/24/2020    Order Specific Question:   ** REASON FOR EXAM (FREE TEXT)    Answer:   Hx of left leg sarcoma, lung mass    Order Specific Question:   If indicated for the ordered procedure, I authorize the administration of a radiopharmaceutical per Radiology protocol    Answer:   Yes    Order Specific Question:   Preferred imaging location?    Answer:   [facility]    Order Specific Question:   Radiology Contrast Protocol - do NOT remove file path    Answer:   \\[email or web address]  . ECHOCARDIOGRAM COMPLETE    Standing Status:   Future    Standing Expiration Date:   07/24/2020    Order Specific Question:   Where should this test be performed    Answer:   [facility]    Order Specific Question:   Perflutren DEFINITY (image enhancing agent) should be administered unless hypersensitivity or allergy exist    Answer:   Administer Perflutren    Order Specific Question:   Is a special reader required? (athlete or structural heart)    Answer:   No    Order Specific Question:   Reason for exam-Echo    Answer:   Chemotherapy evaluation  v87.41 / v58.11    All questions were answered. The patient knows to call the clinic with any problems, questions or concerns.      [name], MD 04/25/2019 2:34 PM

## 2019-04-25 NOTE — Assessment & Plan Note (Addendum)
1.  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 -He underwent chemoradiation therapy with gemcitabine and docetaxel followed by wide local excision. -Pathology on 11/10/2018 shows liposarcoma with 80% necrosis, grade 2, YPT4NX, positive deep margin, amplification of MDM2  detected.  Amplification is associated with well-differentiated liposarcoma/atypical lipomatous tumors and dedifferentiated liposarcoma. -Patient did not follow-up at [facility] for surveillance because of insurance reasons. -He denies any fevers, night sweats or weight loss in the last 6 months.  However he reported severe loss of energy. -Also reported shortness of breath on exertion.  He developed cough which is dry for the last 2 weeks. -He went to the ER on 04/24/2019.  Chest x-ray showed abnormal lung masses. -ER contacted me and I have told him to order CT of the chest, abdomen and pelvis. -We reviewed CT scan of the chest which showed multiple lung masses, largest in the posterior part of the left upper lobe.  There is also adenopathy in the mediastinum and hilum.  There is a left upper pole hypodense lesion concerning for renal cell carcinoma. -I have also compared with CT scans done at [facility] in August 2020.  The left upper pole lesion was present at that time. -I will order bone scan and 2D echocardiogram.  I have also recommended biopsy of the left upper lobe lung mass. -We will have a port placed by IR.  As liposarcoma is anthracycline sensitive tumor, we will consider first-line standard of care treatment with doxorubicin with ifosfamide. -I will also reach out to sarcoma specialist at [facility].  2.  Dry cough: -He complains of having dry cough for the last couple of weeks.  Also reported shortness of breath on exertion. -I think this is related to  irritation from the tumor. -He was given Ladona Ridgel in the ER which he will use.  If no improvement, he will try Robitussin-DM.  If it is not helping, will give some codeine-based cough syrup.  3.  Anxiety: -I will start him on Xanax 0.25 mg 2-3 times a day as needed.

## 2019-05-02 ENCOUNTER — Ambulatory Visit (HOSPITAL_COMMUNITY)
Admission: RE | Admit: 2019-05-02 | Discharge: 2019-05-02 | Disposition: A | Discharge status: Home / Self Care | Payer: 59 | Source: Ambulatory Visit | Attending: Hematology | Admitting: Hematology

## 2019-05-02 ENCOUNTER — Other Ambulatory Visit: Payer: Self-pay | Admitting: Radiology

## 2019-05-02 ENCOUNTER — Ambulatory Visit (HOSPITAL_BASED_OUTPATIENT_CLINIC_OR_DEPARTMENT_OTHER)
Admission: RE | Admit: 2019-05-02 | Discharge: 2019-05-02 | Disposition: A | Discharge status: Home / Self Care | Payer: 59 | Source: Ambulatory Visit | Attending: Hematology | Admitting: Hematology

## 2019-05-02 ENCOUNTER — Other Ambulatory Visit: Payer: Self-pay

## 2019-05-02 DIAGNOSIS — Z0189 Encounter for other specified special examinations: Secondary | ICD-10-CM

## 2019-05-02 DIAGNOSIS — R918 Other nonspecific abnormal finding of lung field: Secondary | ICD-10-CM | POA: Insufficient documentation

## 2019-05-02 DIAGNOSIS — C4922 Malignant neoplasm of connective and soft tissue of left lower limb, including hip: Secondary | ICD-10-CM | POA: Insufficient documentation

## 2019-05-02 MED ORDER — TECHNETIUM TC 99M MEDRONATE IV KIT
20.0000 | PACK | Freq: Once | INTRAVENOUS | Status: AC | PRN
  Administered 2019-05-02: 20 via INTRAVENOUS

## 2019-05-02 NOTE — Progress Notes (Signed)
*  PRELIMINARY RESULTS* Echocardiogram 2D Echocardiogram has been performed.  Leavy Cella 05/02/2019, 9:18 AM

## 2019-05-03 ENCOUNTER — Other Ambulatory Visit: Payer: Self-pay | Admitting: Radiology

## 2019-05-04 ENCOUNTER — Other Ambulatory Visit: Payer: Self-pay

## 2019-05-04 ENCOUNTER — Ambulatory Visit (HOSPITAL_COMMUNITY)
Admission: RE | Admit: 2019-05-04 | Discharge: 2019-05-04 | Disposition: A | Discharge status: Home / Self Care | Payer: 59 | Source: Ambulatory Visit | Attending: Hematology | Admitting: Hematology

## 2019-05-04 ENCOUNTER — Encounter (HOSPITAL_COMMUNITY): Payer: Self-pay

## 2019-05-04 ENCOUNTER — Other Ambulatory Visit: Payer: Self-pay | Admitting: Radiology

## 2019-05-04 DIAGNOSIS — C4922 Malignant neoplasm of connective and soft tissue of left lower limb, including hip: Secondary | ICD-10-CM | POA: Diagnosis present

## 2019-05-04 DIAGNOSIS — R918 Other nonspecific abnormal finding of lung field: Secondary | ICD-10-CM

## 2019-05-04 HISTORY — PX: IR IMAGING GUIDED PORT INSERTION: IMG5740

## 2019-05-04 LAB — CBC WITH DIFFERENTIAL/PLATELET
Abs Immature Granulocytes: 0.05 10*3/uL (ref 0.00–0.07)
Basophils Absolute: 0.1 10*3/uL (ref 0.0–0.1)
Basophils Relative: 1 %
Eosinophils Absolute: 0.3 10*3/uL (ref 0.0–0.5)
Eosinophils Relative: 4 %
HCT: 45.6 % (ref 39.0–52.0)
Hemoglobin: 14.9 g/dL (ref 13.0–17.0)
Immature Granulocytes: 1 %
Lymphocytes Relative: 16 %
Lymphs Abs: 1.2 10*3/uL (ref 0.7–4.0)
MCH: 30.7 pg (ref 26.0–34.0)
MCHC: 32.7 g/dL (ref 30.0–36.0)
MCV: 94 fL (ref 80.0–100.0)
Monocytes Absolute: 0.7 10*3/uL (ref 0.1–1.0)
Monocytes Relative: 9 %
Neutro Abs: 5.1 10*3/uL (ref 1.7–7.7)
Neutrophils Relative %: 69 %
Platelets: 344 10*3/uL (ref 150–400)
RBC: 4.85 MIL/uL (ref 4.22–5.81)
RDW: 13 % (ref 11.5–15.5)
WBC: 7.3 10*3/uL (ref 4.0–10.5)
nRBC: 0 % (ref 0.0–0.2)

## 2019-05-04 LAB — PROTIME-INR
INR: 0.9 (ref 0.8–1.2)
Prothrombin Time: 12.4 seconds (ref 11.4–15.2)

## 2019-05-04 MED ORDER — CEFAZOLIN SODIUM-DEXTROSE 2-4 GM/100ML-% IV SOLN
INTRAVENOUS | Status: AC
  Administered 2019-05-04: 2 g via INTRAVENOUS
  Filled 2019-05-04: qty 100

## 2019-05-04 MED ORDER — FENTANYL CITRATE (PF) 100 MCG/2ML IJ SOLN
INTRAMUSCULAR | Status: DC | PRN
  Administered 2019-05-04 (×2): 50 ug via INTRAVENOUS

## 2019-05-04 MED ORDER — LIDOCAINE HCL (PF) 1 % IJ SOLN
INTRAMUSCULAR | Status: DC | PRN
  Administered 2019-05-04: 10 mL

## 2019-05-04 MED ORDER — MIDAZOLAM HCL 2 MG/2ML IJ SOLN
INTRAMUSCULAR | Status: AC
  Filled 2019-05-04: qty 4

## 2019-05-04 MED ORDER — SODIUM CHLORIDE 0.9 % IV SOLN: INTRAVENOUS | Status: DC

## 2019-05-04 MED ORDER — CEFAZOLIN SODIUM-DEXTROSE 2-4 GM/100ML-% IV SOLN: 2.0000 g | INTRAVENOUS | Status: AC

## 2019-05-04 MED ORDER — MIDAZOLAM HCL 2 MG/2ML IJ SOLN
INTRAMUSCULAR | Status: DC | PRN
  Administered 2019-05-04 (×2): 1 mg via INTRAVENOUS

## 2019-05-04 MED ORDER — LIDOCAINE-EPINEPHRINE 1 %-1:100000 IJ SOLN
INTRAMUSCULAR | Status: AC
  Filled 2019-05-04: qty 1

## 2019-05-04 MED ORDER — FENTANYL CITRATE (PF) 100 MCG/2ML IJ SOLN
INTRAMUSCULAR | Status: AC
  Filled 2019-05-04: qty 2

## 2019-05-04 MED ORDER — HEPARIN SOD (PORK) LOCK FLUSH 100 UNIT/ML IV SOLN
INTRAVENOUS | Status: AC
  Filled 2019-05-04: qty 5

## 2019-05-04 NOTE — Discharge Instructions (Signed)
DO NOT APPLY ANY EMLA CREAM OR LOTIONS TO THE PORT SITE X 2 WEEKS  For any question or concerns call [phone]; for after hours call [phone] and ask for on call MD  Implanted Port Insertion, Care After This sheet gives you information about how to care for yourself after your procedure. Your health care provider may also give you more specific instructions. If you have problems or questions, contact your health care provider. What can I expect after the procedure? After the procedure, it is common to have:  Discomfort at the port insertion site.  Bruising on the skin over the port. This should improve over 3-4 days. Follow these instructions at home: [facility] care  After your port is placed, you will get a manufacturer's information card. The card has information about your port. Keep this card with you at all times.  Take care of the port as told by your health care provider. Ask your health care provider if you or a family member can get training for taking care of the port at home. A home health care nurse may also take care of the port.  Make sure to remember what type of port you have. Incision care      Follow instructions from your health care provider about how to take care of your port insertion site. Make sure you: ? Wash your hands with soap and water before and after you change your bandage (dressing). If soap and water are not available, use hand sanitizer. ? Change your dressing as told by your health care provider. ? Leave stitches (sutures), skin glue, or adhesive strips in place. These skin closures may need to stay in place for 2 weeks or longer. If adhesive strip edges start to loosen and curl up, you may trim the loose edges. Do not remove adhesive strips completely unless your health care provider tells you to do that.  Check your port insertion site every day for signs of infection. Check for: ? Redness, swelling, or pain. ? Fluid or blood. ? Warmth. ? Pus  or a bad smell. Activity  Return to your normal activities as told by your health care provider. Ask your health care provider what activities are safe for you.  Do not lift anything that is heavier than 10 lb (4.5 kg), or the limit that you are told, until your health care provider says that it is safe. General instructions  Take over-the-counter and prescription medicines only as told by your health care provider.  Do not take baths, swim, or use a hot tub until your health care provider approves. Ask your health care provider if you may take showers. You may only be allowed to take sponge baths.  Do not drive for 24 hours if you were given a sedative during your procedure.  Wear a medical alert bracelet in case of an emergency. This will tell any health care providers that you have a port.  Keep all follow-up visits as told by your health care provider. This is important. Contact a health care provider if:  You cannot flush your port with saline as directed, or you cannot draw blood from the port.  You have a fever or chills.  You have redness, swelling, or pain around your port insertion site.  You have fluid or blood coming from your port insertion site.  Your port insertion site feels warm to the touch.  You have pus or a bad smell coming from the port insertion site. Get help  right away if:  You have chest pain or shortness of breath.  You have bleeding from your port that you cannot control. Summary  Take care of the port as told by your health care provider. Keep the manufacturer's information card with you at all times.  Change your dressing as told by your health care provider.  Contact a health care provider if you have a fever or chills or if you have redness, swelling, or pain around your port insertion site.  Keep all follow-up visits as told by your health care provider. This information is not intended to replace advice given to you by your health care  provider. Make sure you discuss any questions you have with your health care provider. Document Revised: 07/27/2017 Document Reviewed: 07/27/2017 Elsevier Patient Education  [place].  Moderate Conscious Sedation, Adult, Care After These instructions provide you with information about caring for yourself after your procedure. Your health care provider may also give you more specific instructions. Your treatment has been planned according to current medical practices, but problems sometimes occur. Call your health care provider if you have any problems or questions after your procedure. What can I expect after the procedure? After your procedure, it is common:  To feel sleepy for several hours.  To feel clumsy and have poor balance for several hours.  To have poor judgment for several hours.  To vomit if you eat too soon. Follow these instructions at home: For at least 24 hours after the procedure:   Do not: ? Participate in activities where you could fall or become injured. ? Drive. ? Use heavy machinery. ? Drink alcohol. ? Take sleeping pills or medicines that cause drowsiness. ? Make important decisions or sign legal documents. ? Take care of children on your own.  Rest. Eating and drinking  Follow the diet recommended by your health care provider.  If you vomit: ? Drink water, juice, or soup when you can drink without vomiting. ? Make sure you have little or no nausea before eating solid foods. General instructions  Have a responsible adult stay with you until you are awake and alert.  Take over-the-counter and prescription medicines only as told by your health care provider.  If you smoke, do not smoke without supervision.  Keep all follow-up visits as told by your health care provider. This is important. Contact a health care provider if:  You keep feeling nauseous or you keep vomiting.  You feel light-headed.  You develop a rash.  You have a  fever. Get help right away if:  You have trouble breathing. This information is not intended to replace advice given to you by your health care provider. Make sure you discuss any questions you have with your health care provider. Document Revised: 12/11/2016 Document Reviewed: 04/20/2015 Elsevier Patient Education  2020 [employer].

## 2019-05-04 NOTE — Procedures (Signed)
  Procedure: R IJ Port catheter placement   EBL:   minimal Complications:  none immediate  See full dictation in [facility].  Dillard Cannon MD Main # [phone] Pager  [phone]

## 2019-05-04 NOTE — H&P (Signed)
Chief Complaint: Patient was seen in consultation today for port-a-cath placement at the request of [place] [place]  Referring Physician(s): Katragadda,Sreedhar  Supervising Physician: Arne Cleveland  Patient Status: [facility] - Out-pt  History of Present Illness: Leonard [name] is a 59 y.o. male with a past medical history of metastatic liposarcoma. 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  He underwent chemoradiation therapy with gemcitabine and docetaxel followed by wide local excision.  Pathology on 11/10/2018 showed liposarcoma, grade 2 with 80% necrosis, positive treatment effect, positive deep margin, and occasional 5 DM2 detected.  He was lost to surveillance as he did not have insurance at that time. The patient is now reporting shortness of breath on exertion for the past couple of months as well as a dry cough for the last several weeks. He presented to the ER on 04/24/2019 and the chest x-ray was abnormal. A CT of the chest, abdomen and pelvis was ordered. CT of the chest PE protocol was negative for pulmonary embolism.  However it showed multiple bilateral lung masses with mediastinal and hilar lymph nodes.  Patient presents today to the [facility] department for the placement of a port-a-catheter for chemotherapy.   Patient complains of chronic dyspnea and cough secondary to metastatic disease. Both of these systems are stable at this time.       Past Medical History:  Diagnosis Date   Cancer [facility])     Past Surgical History:  Procedure Laterality Date   CHOLECYSTECTOMY     ESOPHAGEAL DILATION N/A 06/27/2017   Procedure: ESOPHAGEAL DILATION;  Surgeon: Danie Binder, MD;  Location: [facility] END[facility] SUITE;  Service: Endoscopy;  Laterality: N/A;   ESOPHAGOGASTRODUODENOSCOPY N/A 06/27/2017   Procedure: ESOPHAGOGASTRODUODENOSCOPY (EGD);  Surgeon: Danie Binder, MD;  Location: [facility] ENDO  SUITE;  Service: Endoscopy;  Laterality: N/A;   knee sx      Allergies: Patient has no known allergies.  Medications: Prior to Admission medications   Medication Sig Start Date End Date Taking? Authorizing Provider  ALPRAZolam (XANAX) 0.25 MG tablet Take 1 tablet (0.25 mg total) by mouth 2 (two) times daily as needed for anxiety. 04/25/19  Yes [name], MD  benzonatate (TESSALON) 100 MG capsule Take 2 capsules (200 mg total) by mouth 3 (three) times daily as needed. Patient taking differently: Take 200 mg by mouth 3 (three) times daily as needed for cough.  04/24/19  Yes Idol, Almyra Free, PA-C  Pseudoeph-Doxylamine-DM-APAP (DAYQUIL/NYQUIL COLD/FLU RELIEF PO) Take 30 mLs by mouth in the morning and at bedtime. Takes 70ml of dayquil each morning and 58ml of nyquil at bedtime.   Yes [provider]     Family History  Problem Relation Age of Onset   Heart disease Father    Cancer Maternal Aunt    Cancer Maternal Uncle    Cancer Paternal Uncle    Vision loss Maternal Grandfather    Colon cancer Neg Hx    Colon polyps Neg Hx     Social History   Socioeconomic History   Marital status: Married    Spouse name: Not on file   Number of children: 1   Years of education: Not on file   Highest education level: Not on file  Occupational History   Occupation: Unemployed  Tobacco Use   Smoking status: Former Smoker   Smokeless tobacco: Never Used  Substance and Sexual Activity   Alcohol use: Yes  Comment: 1-2 drinks per month   Drug use: Never   Sexual activity: Not Currently  Other Topics Concern   Not on file  Social History Narrative   DOES CONSTRUCTION. MARRIED. RARE ETOH. NO TOBACCO.   Social Determinants of Health   Financial Resource Strain: Medium Risk   Difficulty of Paying Living Expenses: Somewhat hard  Food Insecurity: No Food Insecurity   Worried About Charity fundraiser in the Last Year: Never true   Ran Out of Food in the  Last Year: Never true  Transportation Needs: No Transportation Needs   Lack of Transportation (Medical): No   Lack of Transportation (Non-Medical): No  Physical Activity: Inactive   Days of Exercise per Week: 0 days   Minutes of Exercise per Session: 0 min  Stress: Stress Concern Present   Feeling of Stress : To some extent  Social Connections: Slightly Isolated   Frequency of Communication with Friends and Family: More than three times a week   Frequency of Social Gatherings with Friends and Family: Once a week   Attends Religious Services: More than 4 times per year   Active Member of [employer] or Organizations: No   Attends Archivist Meetings: Never   Marital Status: Married    ECOG Status: 1 - Symptomatic but completely ambulatory  Review of Systems: A 12 point ROS discussed and pertinent positives are indicated in the HPI above.  All other systems are negative.  Review of Systems  Constitutional: Positive for activity change and fatigue.  Respiratory: Positive for cough and shortness of breath.   Cardiovascular: Positive for leg swelling. Negative for chest pain.  Gastrointestinal: Negative for abdominal distention and abdominal pain.    Vital Signs: There were no vitals taken for this visit.  Physical Exam Constitutional:      General: He is not in acute distress.    Appearance: Normal appearance.  Pulmonary:     Effort: Pulmonary effort is normal.     Comments: Diminished breath sounds Abdominal:     General: Bowel sounds are normal. There is no distension.     Palpations: Abdomen is soft.     Tenderness: There is no abdominal tenderness.  Musculoskeletal:     Right lower leg: Edema present.     Left lower leg: Edema present.  Skin:    General: Skin is warm and dry.  Neurological:     General: No focal deficit present.     Mental Status: He is alert and oriented to person, place, and time.  Psychiatric:        Mood and Affect: Mood normal.         Behavior: Behavior normal.        Thought Content: Thought content normal.        Judgment: Judgment normal.     Imaging: DG Chest 2 View  Result Date: 04/24/2019 CLINICAL DATA:  Cough and shortness of breath. EXAM: CHEST - 2 VIEW COMPARISON:  None. FINDINGS: Trachea is midline. Heart is at the upper limits of normal in size to mildly enlarged. There are nodules and masses in the lungs bilaterally, measuring up to 4.9 x 5.1 cm in the left upper lobe. There may be mild interstitial prominence as well. No pleural fluid. IMPRESSION: 1. Bilateral pulmonary nodules and masses are indicative of metastatic disease. 2. Possible mild interstitial prominence, of uncertain acuity. Difficult to exclude edema or atypical/viral pneumonia. Electronically Signed   By: Lorin Picket M.D.   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 Cough and shortness of breath EXAM: CT ANGIOGRAPHY CHEST CT ABDOMEN AND PELVIS WITH CONTRAST TECHNIQUE: Multidetector CT imaging of the chest was performed using the standard protocol during bolus administration of intravenous contrast. Multiplanar CT image reconstructions and MIPs were obtained to evaluate the vascular anatomy. Multidetector CT imaging of the abdomen and pelvis was performed using the standard protocol during bolus administration of intravenous contrast. CONTRAST:  182mL OMNIPAQUE IOHEXOL 350 MG/ML SOLN COMPARISON:  Chest radiograph April 24, 2019 FINDINGS: CTA CHEST FINDINGS Cardiovascular: There is no demonstrable pulmonary embolus. There is no thoracic aortic aneurysm or dissection. The visualized great vessels appear normal. There are multiple foci coronary artery calcification. There are foci of aortic atherosclerosis. No pericardial effusion or pericardial thickening evident. Mediastinum/Nodes: Thyroid appears  somewhat inhomogeneous without dominant mass. There is a lymph node in the left hilum measuring 1.3 x 1.3 cm. There is a lymph node to the left of the carina measuring 1.2 x 1.0 cm. There are several subcentimeter mediastinal lymph nodes, largest measuring 1.0 x 1.0 cm. No esophageal lesions are evident. Lungs/Pleura: Widespread pulmonary metastatic disease noted with multiple nodular opacities seen throughout the lungs. The largest individual nodular lesion is seen in the posterior segment of the left upper lobe abutting the pleura measuring 4.7 x 4.2 cm. Nodular lesions elsewhere range in size from as small as 5 mm to as large as 4.1 x 3.2 cm. This nodular opacity is noted in the inferior lingula. There is no airspace consolidation or edema. No pleural effusions are evident. Musculoskeletal: There are foci of degenerative change in the thoracic spine. There are no blastic or lytic bone lesions. No chest wall lesions evident. Review of the MIP images confirms the above findings. CT ABDOMEN and PELVIS FINDINGS Hepatobiliary: There is hepatic steatosis. No focal liver lesions are demonstrable. Gallbladder wall is not appreciably thickened. There is no biliary duct dilatation. Pancreas: There is no pancreatic mass or inflammatory focus. Spleen: No splenic lesions are evident. Adrenals/Urinary Tract: Adrenals bilaterally appear normal. There is a mass arising in the periphery of the upper pole left kidney measuring 1.3 x 1.4 cm. This mass has attenuation values higher than is expected with a cyst. There is an 8 x 8 mm cyst in the lower pole the left kidney. There is no evident hydronephrosis on either side. There is no evident renal or ureteral calculus on either side. Urinary bladder is midline and essentially completely empty. Stomach/Bowel: There is no appreciable bowel wall or mesenteric thickening. There is no evident bowel obstruction. The terminal ileum appears unremarkable. There is fatty infiltration in the  ileocecal valve. Vascular/Lymphatic: There is aortic atherosclerosis. No aneurysm evident. Major mesenteric arterial vessels are patent. Major venous structures are patent. There is no appreciable adenopathy in the abdomen or pelvis. Reproductive: Prostate and seminal vesicles are normal in size and contour. No pelvic mass evident. Other: Appendix appears normal. There is no evident ascites or abscess in the abdomen or pelvis. There is mild fat in the umbilicus. Musculoskeletal: There are no blastic or lytic bone lesions. No intramuscular lesions are evident. Surgical clips are noted in the posterior right pelvic wall and involving the right gluteus maximus muscle. Review of the MIP images confirms the above findings. IMPRESSION: 1. No demonstrable pulmonary embolus. No thoracic aortic aneurysm or dissection. There is aortic atherosclerosis as  well as multiple foci of coronary artery calcification. 2. Widespread pulmonary metastatic lesions throughout the lungs involving most lobes in segments. Largest individual masses in the posterior segment of the left upper lobe abutting the pleura measuring 4.7 x 4.2 cm. A mass in the inferior lingula measures 4.1 x 3.2 cm. No edema or airspace opacity. 3. Several borderline prominent lymph nodes, likely of neoplastic etiology. CT abdomen and pelvis: 1. There is a mass arising in the upper pole left kidney measuring 1.3 x 1.4 cm which has attenuation values higher than is expected with a cyst. Question potential left renal metastasis or possibly small primary left renal carcinoma. 2.  No adenopathy in the abdomen or pelvis. 3. No bowel wall thickening or bowel obstruction. No abscess in the abdomen or pelvis. Appendix appears normal. 4. No renal or ureteral calculus. No hydronephrosis. Urinary bladder empty. 5.  Hepatic steatosis.  No focal liver lesions are evident. 6.  Gallbladder absent. 7.  Aortic atherosclerosis. Aortic Atherosclerosis (ICD10-I70.0). Electronically Signed    By: Lowella Grip III M.D.   On: 04/24/2019 13:57   NM Bone Scan Whole Body  Result Date: 05/02/2019 CLINICAL DATA:  History of surgical resection of a left leg sarcoma with multiple pulmonary metastases seen on a recent chest CTA. There is also a left renal mass on an abdomen and pelvis CT. EXAM: NUCLEAR MEDICINE WHOLE BODY BONE SCAN TECHNIQUE: Whole body anterior and posterior images were obtained approximately 3 hours after intravenous injection of radiopharmaceutical. RADIOPHARMACEUTICALS:  20 mCi Technetium-27m MDP IV COMPARISON:  Chest CTA and abdomen and pelvis CT dated 04/24/2019 FINDINGS: Increased tracer activity at both acromioclavicular joints, both elbows, both wrists, both sternoclavicular joints, both knees and both ankles and proximal feet. Focally increased tracer uptake in the region of the left 1st MTP joint and distal right 2nd toe. No abnormal tracer uptake elsewhere in the bony skeleton. Normal renal and bladder activity. IMPRESSION: 1. No evidence of bony metastatic disease. 2. Multi joint degenerative changes. Electronically Signed   By: Claudie Revering M.D.   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 Cough and shortness of breath EXAM: CT ANGIOGRAPHY CHEST CT ABDOMEN AND PELVIS WITH CONTRAST TECHNIQUE: Multidetector CT imaging of the chest was performed using the standard protocol during bolus administration of intravenous contrast. Multiplanar CT image reconstructions and MIPs were obtained to evaluate the vascular anatomy. Multidetector CT imaging of the abdomen and pelvis was performed using the standard protocol during bolus administration of intravenous contrast. CONTRAST:  178mL OMNIPAQUE IOHEXOL 350 MG/ML SOLN COMPARISON:  Chest radiograph April 24, 2019 FINDINGS: CTA CHEST FINDINGS Cardiovascular: There is no demonstrable pulmonary  embolus. There is no thoracic aortic aneurysm or dissection. The visualized great vessels appear normal. There are multiple foci coronary artery calcification. There are foci of aortic atherosclerosis. No pericardial effusion or pericardial thickening evident. Mediastinum/Nodes: Thyroid appears somewhat inhomogeneous without dominant mass. There is a lymph node in the left hilum measuring 1.3 x 1.3 cm. There is a lymph node to the left of the carina measuring 1.2 x 1.0 cm. There are several subcentimeter mediastinal lymph nodes, largest measuring 1.0 x 1.0 cm. No esophageal lesions are evident. Lungs/Pleura: Widespread pulmonary metastatic disease noted with multiple nodular opacities seen throughout the lungs. The largest individual nodular lesion is seen in the posterior segment of the left upper lobe abutting the pleura measuring 4.7  x 4.2 cm. Nodular lesions elsewhere range in size from as small as 5 mm to as large as 4.1 x 3.2 cm. This nodular opacity is noted in the inferior lingula. There is no airspace consolidation or edema. No pleural effusions are evident. Musculoskeletal: There are foci of degenerative change in the thoracic spine. There are no blastic or lytic bone lesions. No chest wall lesions evident. Review of the MIP images confirms the above findings. CT ABDOMEN and PELVIS FINDINGS Hepatobiliary: There is hepatic steatosis. No focal liver lesions are demonstrable. Gallbladder wall is not appreciably thickened. There is no biliary duct dilatation. Pancreas: There is no pancreatic mass or inflammatory focus. Spleen: No splenic lesions are evident. Adrenals/Urinary Tract: Adrenals bilaterally appear normal. There is a mass arising in the periphery of the upper pole left kidney measuring 1.3 x 1.4 cm. This mass has attenuation values higher than is expected with a cyst. There is an 8 x 8 mm cyst in the lower pole the left kidney. There is no evident hydronephrosis on either side. There is no evident  renal or ureteral calculus on either side. Urinary bladder is midline and essentially completely empty. Stomach/Bowel: There is no appreciable bowel wall or mesenteric thickening. There is no evident bowel obstruction. The terminal ileum appears unremarkable. There is fatty infiltration in the ileocecal valve. Vascular/Lymphatic: There is aortic atherosclerosis. No aneurysm evident. Major mesenteric arterial vessels are patent. Major venous structures are patent. There is no appreciable adenopathy in the abdomen or pelvis. Reproductive: Prostate and seminal vesicles are normal in size and contour. No pelvic mass evident. Other: Appendix appears normal. There is no evident ascites or abscess in the abdomen or pelvis. There is mild fat in the umbilicus. Musculoskeletal: There are no blastic or lytic bone lesions. No intramuscular lesions are evident. Surgical clips are noted in the posterior right pelvic wall and involving the right gluteus maximus muscle. Review of the MIP images confirms the above findings. IMPRESSION: 1. No demonstrable pulmonary embolus. No thoracic aortic aneurysm or dissection. There is aortic atherosclerosis as well as multiple foci of coronary artery calcification. 2. Widespread pulmonary metastatic lesions throughout the lungs involving most lobes in segments. Largest individual masses in the posterior segment of the left upper lobe abutting the pleura measuring 4.7 x 4.2 cm. A mass in the inferior lingula measures 4.1 x 3.2 cm. No edema or airspace opacity. 3. Several borderline prominent lymph nodes, likely of neoplastic etiology. CT abdomen and pelvis: 1. There is a mass arising in the upper pole left kidney measuring 1.3 x 1.4 cm which has attenuation values higher than is expected with a cyst. Question potential left renal metastasis or possibly small primary left renal carcinoma. 2.  No adenopathy in the abdomen or pelvis. 3. No bowel wall thickening or bowel obstruction. No abscess in  the abdomen or pelvis. Appendix appears normal. 4. No renal or ureteral calculus. No hydronephrosis. Urinary bladder empty. 5.  Hepatic steatosis.  No focal liver lesions are evident. 6.  Gallbladder absent. 7.  Aortic atherosclerosis. Aortic Atherosclerosis (ICD10-I70.0). Electronically Signed   By: Lowella Grip III M.D.   On: 04/24/2019 13:57   ECHOCARDIOGRAM COMPLETE  Result Date: 05/02/2019    ECHOCARDIOGRAM REPORT   Patient Name:   Leonard [name] Date of Exam: 05/02/2019 Medical Rec #:  [id number]     Height:       69.0 in Accession #:    [id number]    Weight:  245.2 lb Date of Birth:  1960-12-04     BSA:          2.253 m Patient Age:    34 years      BP:           126/82 mmHg Patient Gender: M             HR:           73 bpm. Exam Location:  [facility] Procedure: 2D Echo Indications:    Chemotherapy evaluation v87.41 / v58.11  History:        Patient has no prior history of Echocardiogram examinations.                 CAD; Risk Factors:Former Smoker, Diabetes and Hypertension.                 Liposarcoma of left lower extremity.  Sonographer:    Leavy Cella RDCS (AE) Referring Phys: [place]  1. Left ventricular ejection fraction, by estimation, is 60 to 65%. The left ventricle has normal function. The left ventricle has no regional wall motion abnormalities. There is moderate concentric left ventricular hypertrophy. Left ventricular diastolic parameters are consistent with Grade I diastolic dysfunction (impaired relaxation).  2. Right ventricular systolic function is normal. The right ventricular size is normal.  3. The mitral valve is grossly normal. No evidence of mitral valve regurgitation.  4. The aortic valve was not well visualized. Aortic valve regurgitation is not visualized. No aortic stenosis is present.  5. The inferior vena cava is normal in size with greater than 50% respiratory variability, suggesting right atrial pressure of 3 mmHg. FINDINGS  Left  Ventricle: Left ventricular ejection fraction, by estimation, is 60 to 65%. The left ventricle has normal function. The left ventricle has no regional wall motion abnormalities. The left ventricular internal cavity size was normal in size. There is  moderate concentric left ventricular hypertrophy. Left ventricular diastolic parameters are consistent with Grade I diastolic dysfunction (impaired relaxation). Normal left ventricular filling pressure. Right Ventricle: The right ventricular size is normal. No increase in right ventricular wall thickness. Right ventricular systolic function is normal. Left Atrium: Left atrial size was normal in size. Right Atrium: Right atrial size was normal in size. Pericardium: There is no evidence of pericardial effusion. Mitral Valve: The mitral valve is grossly normal. No evidence of mitral valve regurgitation. Tricuspid Valve: The tricuspid valve is grossly normal. Tricuspid valve regurgitation is trivial. Aortic Valve: The aortic valve was not well visualized. Aortic valve regurgitation is not visualized. No aortic stenosis is present. Pulmonic Valve: The pulmonic valve was not well visualized. Pulmonic valve regurgitation is not visualized. Aorta: The aortic root is normal in size and structure. Venous: The inferior vena cava is normal in size with greater than 50% respiratory variability, suggesting right atrial pressure of 3 mmHg. IAS/Shunts: No atrial level shunt detected by color flow Doppler.  LEFT VENTRICLE PLAX 2D LVIDd:         4.27 cm  Diastology LVIDs:         2.75 cm  LV e' lateral:   9.25 cm/s LV PW:         1.38 cm  LV E/e' lateral: 6.3 LV IVS:        1.27 cm  LV e' medial:    4.90 cm/s LVOT diam:     2.00 cm  LV E/e' medial:  12.0 LVOT Area:     3.14 cm  RIGHT VENTRICLE RV S prime:     13.50 cm/s TAPSE (M-mode): 2.3 cm LEFT ATRIUM             Index LA diam:        3.40 cm 1.51 cm/m LA Vol (A2C):   62.9 ml 27.92 ml/m LA Vol (A4C):   41.7 ml 18.51 ml/m LA Biplane  Vol: 52.6 ml 23.35 ml/m   AORTA Ao Root diam: 3.20 cm MITRAL VALVE MV Area (PHT): 3.37 cm    SHUNTS MV Decel Time: 225 msec    Systemic Diam: 2.00 cm MV E velocity: 58.70 cm/s MV A velocity: 60.00 cm/s MV E/A ratio:  0.98 Kate Sable MD Electronically signed by Kate Sable MD Signature Date/Time: 05/02/2019/10:04:51 AM    Final     Labs:  CBC: Recent Labs    08/01/18 1211 08/02/18 0547 04/24/19 1151  WBC 6.0 5.4 8.3  HGB 15.1 14.3 15.0  HCT 45.1 42.7 45.4  PLT 255 216 333    COAGS: No results for input(s): INR, APTT in the last 8760 hours.  BMP: Recent Labs    08/01/18 1211 08/02/18 0547 04/24/19 1151  NA 141 138 137  K 3.8 3.8 3.9  CL 108 107 102  CO2 24 24 24   GLUCOSE 109* 99 135*  BUN 13 14 15   CALCIUM 9.1 8.6* 9.1  CREATININE 0.92 0.79 0.99  GFRNONAA >60 >60 >60  GFRAA >60 >60 >60    LIVER FUNCTION TESTS: Recent Labs    04/24/19 1151  BILITOT 0.8  AST 18  ALT 25  ALKPHOS 125  PROT 7.7  ALBUMIN 4.0    TUMOR MARKERS: No results for input(s): AFPTM, CEA, CA199, CHROMGRNA in the last 8760 hours.  Assessment and Plan:  Patient with a history of metastatic liposarcoma with plans to begin systemic chemotherapy as management. Plan for image-guided port-a-cath in Interventional Radiology by Dr. Arne Cleveland. Patient is afebrile, has been NPO and is not on any blood thinners. Risks and benefits of image-guided Port-a-catheter placement were discussed with the patient including, but not limited to bleeding, infection, pneumothorax, or fibrin sheath development and need for additional procedures. All of the patient's questions were answered, patient is agreeable to proceed. Consent signed and in chart.  Thank you for this interesting consult.  I greatly enjoyed meeting Leonard [name] and look forward to participating in their care.  A copy of this report was sent to the requesting provider on this date.  Electronically Signed: Theresa Duty,  NP 05/04/2019, 2:20 PM   I spent a total of 30 min. in face to face in clinical consultation, greater than 50% of which was counseling/coordinating care for image-guided port-a-cath placement.

## 2019-05-05 ENCOUNTER — Ambulatory Visit (HOSPITAL_COMMUNITY)
Admission: RE | Admit: 2019-05-05 | Discharge: 2019-05-05 | Disposition: A | Discharge status: Home / Self Care | Payer: 59 | Source: Ambulatory Visit | Attending: Hematology | Admitting: Hematology

## 2019-05-05 ENCOUNTER — Other Ambulatory Visit: Payer: Self-pay | Admitting: Radiology

## 2019-05-05 DIAGNOSIS — Z20822 Contact with and (suspected) exposure to covid-19: Secondary | ICD-10-CM | POA: Diagnosis not present

## 2019-05-05 DIAGNOSIS — Z01812 Encounter for preprocedural laboratory examination: Secondary | ICD-10-CM | POA: Insufficient documentation

## 2019-05-06 LAB — SARS CORONAVIRUS 2 (TAT 6-24 HRS): SARS Coronavirus 2: NEGATIVE

## 2019-05-08 ENCOUNTER — Encounter (HOSPITAL_COMMUNITY): Payer: Self-pay

## 2019-05-08 ENCOUNTER — Ambulatory Visit (HOSPITAL_COMMUNITY)
Admission: RE | Admit: 2019-05-08 | Discharge: 2019-05-08 | Disposition: A | Discharge status: Home / Self Care | Payer: 59 | Source: Ambulatory Visit | Attending: Interventional Radiology | Admitting: Interventional Radiology

## 2019-05-08 ENCOUNTER — Other Ambulatory Visit: Payer: Self-pay

## 2019-05-08 ENCOUNTER — Ambulatory Visit (HOSPITAL_COMMUNITY)
Admission: RE | Admit: 2019-05-08 | Discharge: 2019-05-08 | Disposition: A | Discharge status: Home / Self Care | Payer: 59 | Source: Ambulatory Visit | Attending: Hematology | Admitting: Hematology

## 2019-05-08 DIAGNOSIS — Z8249 Family history of ischemic heart disease and other diseases of the circulatory system: Secondary | ICD-10-CM | POA: Diagnosis not present

## 2019-05-08 DIAGNOSIS — Z87891 Personal history of nicotine dependence: Secondary | ICD-10-CM | POA: Insufficient documentation

## 2019-05-08 DIAGNOSIS — Z79899 Other long term (current) drug therapy: Secondary | ICD-10-CM | POA: Diagnosis not present

## 2019-05-08 DIAGNOSIS — R918 Other nonspecific abnormal finding of lung field: Secondary | ICD-10-CM

## 2019-05-08 DIAGNOSIS — C7652 Malignant neoplasm of left lower limb: Secondary | ICD-10-CM | POA: Diagnosis not present

## 2019-05-08 DIAGNOSIS — C4922 Malignant neoplasm of connective and soft tissue of left lower limb, including hip: Secondary | ICD-10-CM

## 2019-05-08 DIAGNOSIS — Z9889 Other specified postprocedural states: Secondary | ICD-10-CM

## 2019-05-08 LAB — CBC WITH DIFFERENTIAL/PLATELET
Abs Immature Granulocytes: 0.03 10*3/uL (ref 0.00–0.07)
Basophils Absolute: 0 10*3/uL (ref 0.0–0.1)
Basophils Relative: 1 %
Eosinophils Absolute: 0.3 10*3/uL (ref 0.0–0.5)
Eosinophils Relative: 5 %
HCT: 42.9 % (ref 39.0–52.0)
Hemoglobin: 14.3 g/dL (ref 13.0–17.0)
Immature Granulocytes: 1 %
Lymphocytes Relative: 18 %
Lymphs Abs: 1 10*3/uL (ref 0.7–4.0)
MCH: 30.8 pg (ref 26.0–34.0)
MCHC: 33.3 g/dL (ref 30.0–36.0)
MCV: 92.5 fL (ref 80.0–100.0)
Monocytes Absolute: 0.6 10*3/uL (ref 0.1–1.0)
Monocytes Relative: 10 %
Neutro Abs: 3.8 10*3/uL (ref 1.7–7.7)
Neutrophils Relative %: 65 %
Platelets: 332 10*3/uL (ref 150–400)
RBC: 4.64 MIL/uL (ref 4.22–5.81)
RDW: 13.2 % (ref 11.5–15.5)
WBC: 5.8 10*3/uL (ref 4.0–10.5)
nRBC: 0 % (ref 0.0–0.2)

## 2019-05-08 MED ORDER — SODIUM CHLORIDE 0.9 % IV SOLN: INTRAVENOUS | Status: DC

## 2019-05-08 MED ORDER — FENTANYL CITRATE (PF) 100 MCG/2ML IJ SOLN
INTRAMUSCULAR | Status: AC | PRN
  Administered 2019-05-08 (×2): 50 ug via INTRAVENOUS

## 2019-05-08 MED ORDER — LIDOCAINE HCL 1 % IJ SOLN
INTRAMUSCULAR | Status: AC
  Filled 2019-05-08: qty 20

## 2019-05-08 MED ORDER — MIDAZOLAM HCL 2 MG/2ML IJ SOLN
INTRAMUSCULAR | Status: AC
  Filled 2019-05-08: qty 2

## 2019-05-08 MED ORDER — FENTANYL CITRATE (PF) 100 MCG/2ML IJ SOLN
INTRAMUSCULAR | Status: AC
  Filled 2019-05-08: qty 2

## 2019-05-08 MED ORDER — MIDAZOLAM HCL 2 MG/2ML IJ SOLN
INTRAMUSCULAR | Status: AC | PRN
  Administered 2019-05-08 (×2): 1 mg via INTRAVENOUS

## 2019-05-08 NOTE — Procedures (Signed)
Pre procedural Dx: Pulmonary mets Post procedural Dx: Same  Technically successful CT guided biopsy of dominant left upper lobe mass.   EBL: None.  Complications: None immediate.   Ronny Bacon, MD Pager #: [phone]

## 2019-05-08 NOTE — H&P (Signed)
Chief Complaint: Patient was seen in consultation today for port-a-cath placement at the request of [place]  Referring Physician(s): Katragadda,Sreedhar  Supervising Physician: Arne Cleveland  Patient Status: [facility] - Out-pt  History of Present Illness: Leonard [name] is a 59 y.o. male with a past medical history of metastatic liposarcoma. 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  He underwent chemoradiation therapy with gemcitabine and docetaxel followed by wide local excision.  Pathology on 11/10/2018 showed liposarcoma, grade 2 with 80% necrosis, positive treatment effect, positive deep margin, and occasional 5 DM2 detected.  He was lost to surveillance as he did not have insurance at that time. The patient is now reporting shortness of breath on exertion for the past couple of months as well as a dry cough for the last several weeks. He presented to the ER on 04/24/2019 and the chest x-ray was abnormal. A CT of the chest, abdomen and pelvis was ordered. CT of the chest PE protocol was negative for pulmonary embolism.  However it showed multiple bilateral lung masses with mediastinal and hilar lymph nodes. He underwent port placement last Friday and is doing well, no new c/o. Patient presents today for lung mass biopsy. PMHx, meds, labs, imaging, allergies reviewed. Feels well, no recent fevers, chills, illness. Has been NPO today as directed.   Past Medical History:  Diagnosis Date  . Cancer [facility])     Past Surgical History:  Procedure Laterality Date  . CHOLECYSTECTOMY    . ESOPHAGEAL DILATION N/A 06/27/2017   Procedure: ESOPHAGEAL DILATION;  Surgeon: Danie Binder, MD;  Location: [facility] ENDO SUITE;  Service: Endoscopy;  Laterality: N/A;  . ESOPHAGOGASTRODUODENOSCOPY N/A 06/27/2017   Procedure: ESOPHAGOGASTRODUODENOSCOPY (EGD);  Surgeon: Danie Binder, MD;  Location: [facility] ENDO SUITE;  Service: Endoscopy;   Laterality: N/A;  . IR IMAGING GUIDED PORT INSERTION  05/04/2019  . knee sx      Allergies: Patient has no known allergies.  Medications: Prior to Admission medications   Medication Sig Start Date End Date Taking? Authorizing Provider  ALPRAZolam (XANAX) 0.25 MG tablet Take 1 tablet (0.25 mg total) by mouth 2 (two) times daily as needed for anxiety. 04/25/19  Yes [name], MD  benzonatate (TESSALON) 100 MG capsule Take 2 capsules (200 mg total) by mouth 3 (three) times daily as needed. Patient taking differently: Take 200 mg by mouth 3 (three) times daily as needed for cough.  04/24/19  Yes Idol, Almyra Free, PA-C  Pseudoeph-Doxylamine-DM-APAP (DAYQUIL/NYQUIL COLD/FLU RELIEF PO) Take 30 mLs by mouth in the morning and at bedtime. Takes 39ml of dayquil each morning and 12ml of nyquil at bedtime.   Yes [provider]     Family History  Problem Relation Age of Onset  . Heart disease Father   . Cancer Maternal Aunt   . Cancer Maternal Uncle   . Cancer Paternal Uncle   . Vision loss Maternal Grandfather   . Colon cancer Neg Hx   . Colon polyps Neg Hx     Social History   Socioeconomic History  . Marital status: Married    Spouse name: Not on file  . Number of children: 1  . Years of education: Not on file  . Highest education level: Not on file  Occupational History  . Occupation: Unemployed  Tobacco Use  . Smoking status: Former Research scientist (life sciences)  . Smokeless tobacco: Never Used  Substance and Sexual Activity  . Alcohol use:  Yes    Comment: 1-2 drinks per month  . Drug use: Never  . Sexual activity: Not Currently  Other Topics Concern  . Not on file  Social History Narrative   DOES CONSTRUCTION. MARRIED. RARE ETOH. NO TOBACCO.   Social Determinants of Health   Financial Resource Strain: Medium Risk  . Difficulty of Paying Living Expenses: Somewhat hard  Food Insecurity: No Food Insecurity  . Worried About Charity fundraiser in the Last Year: Never true  . Ran  Out of Food in the Last Year: Never true  Transportation Needs: No Transportation Needs  . Lack of Transportation (Medical): No  . Lack of Transportation (Non-Medical): No  Physical Activity: Inactive  . Days of Exercise per Week: 0 days  . Minutes of Exercise per Session: 0 min  Stress: Stress Concern Present  . Feeling of Stress : To some extent  Social Connections: Slightly Isolated  . Frequency of Communication with Friends and Family: More than three times a week  . Frequency of Social Gatherings with Friends and Family: Once a week  . Attends Religious Services: More than 4 times per year  . Active Member of Clubs or Organizations: No  . Attends Archivist Meetings: Never  . Marital Status: Married     Review of Systems: A 12 point ROS discussed and pertinent positives are indicated in the HPI above.  All other systems are negative.  Review of Systems  Constitutional: Positive for activity change and fatigue.  Respiratory: Positive for cough. Negative for shortness of breath.   Cardiovascular: Positive for leg swelling. Negative for chest pain.  Gastrointestinal: Negative for abdominal distention and abdominal pain.    Vital Signs: BP (!) 130/98   Pulse 73   Temp 97.7 F (36.5 C) (Oral)   Resp 18   Ht 5\' 9"  (1.753 m)   Wt 112.5 kg   SpO2 94%   BMI 36.62 kg/m   Physical Exam Constitutional:      General: He is not in acute distress.    Appearance: Normal appearance.  HENT:     Head: Normocephalic.     Mouth/Throat:     Mouth: Mucous membranes are moist.     Pharynx: Oropharynx is clear.  Cardiovascular:     Rate and Rhythm: Normal rate and regular rhythm.     Heart sounds: Normal heart sounds.  Pulmonary:     Effort: Pulmonary effort is normal. No respiratory distress.     Breath sounds: Normal breath sounds.     Comments: Diminished breath sounds Abdominal:     General: Bowel sounds are normal. There is no distension.     Palpations: Abdomen  is soft.     Tenderness: There is no abdominal tenderness.  Musculoskeletal:     Right lower leg: Edema present.     Left lower leg: Edema present.  Skin:    General: Skin is warm and dry.  Neurological:     General: No focal deficit present.     Mental Status: He is alert and oriented to person, place, and time.  Psychiatric:        Mood and Affect: Mood normal.        Behavior: Behavior normal.        Thought Content: Thought content normal.        Judgment: Judgment normal.     Imaging: DG Chest 2 View  Result Date: 04/24/2019 CLINICAL DATA:  Cough and shortness of breath. EXAM: CHEST -  2 VIEW COMPARISON:  None. FINDINGS: Trachea is midline. Heart is at the upper limits of normal in size to mildly enlarged. There are nodules and masses in the lungs bilaterally, measuring up to 4.9 x 5.1 cm in the left upper lobe. There may be mild interstitial prominence as well. No pleural fluid. IMPRESSION: 1. Bilateral pulmonary nodules and masses are indicative of metastatic disease. 2. Possible mild interstitial prominence, of uncertain acuity. Difficult to exclude edema or atypical/viral pneumonia. Electronically Signed   By: Lorin Picket M.D.   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 Cough and shortness of breath EXAM: CT ANGIOGRAPHY CHEST CT ABDOMEN AND PELVIS WITH CONTRAST TECHNIQUE: Multidetector CT imaging of the chest was performed using the standard protocol during bolus administration of intravenous contrast. Multiplanar CT image reconstructions and MIPs were obtained to evaluate the vascular anatomy. Multidetector CT imaging of the abdomen and pelvis was performed using the standard protocol during bolus administration of intravenous contrast. CONTRAST:  143mL OMNIPAQUE IOHEXOL 350 MG/ML SOLN COMPARISON:  Chest radiograph April 24, 2019  FINDINGS: CTA CHEST FINDINGS Cardiovascular: There is no demonstrable pulmonary embolus. There is no thoracic aortic aneurysm or dissection. The visualized great vessels appear normal. There are multiple foci coronary artery calcification. There are foci of aortic atherosclerosis. No pericardial effusion or pericardial thickening evident. Mediastinum/Nodes: Thyroid appears somewhat inhomogeneous without dominant mass. There is a lymph node in the left hilum measuring 1.3 x 1.3 cm. There is a lymph node to the left of the carina measuring 1.2 x 1.0 cm. There are several subcentimeter mediastinal lymph nodes, largest measuring 1.0 x 1.0 cm. No esophageal lesions are evident. Lungs/Pleura: Widespread pulmonary metastatic disease noted with multiple nodular opacities seen throughout the lungs. The largest individual nodular lesion is seen in the posterior segment of the left upper lobe abutting the pleura measuring 4.7 x 4.2 cm. Nodular lesions elsewhere range in size from as small as 5 mm to as large as 4.1 x 3.2 cm. This nodular opacity is noted in the inferior lingula. There is no airspace consolidation or edema. No pleural effusions are evident. Musculoskeletal: There are foci of degenerative change in the thoracic spine. There are no blastic or lytic bone lesions. No chest wall lesions evident. Review of the MIP images confirms the above findings. CT ABDOMEN and PELVIS FINDINGS Hepatobiliary: There is hepatic steatosis. No focal liver lesions are demonstrable. Gallbladder wall is not appreciably thickened. There is no biliary duct dilatation. Pancreas: There is no pancreatic mass or inflammatory focus. Spleen: No splenic lesions are evident. Adrenals/Urinary Tract: Adrenals bilaterally appear normal. There is a mass arising in the periphery of the upper pole left kidney measuring 1.3 x 1.4 cm. This mass has attenuation values higher than is expected with a cyst. There is an 8 x 8 mm cyst in the lower pole the left  kidney. There is no evident hydronephrosis on either side. There is no evident renal or ureteral calculus on either side. Urinary bladder is midline and essentially completely empty. Stomach/Bowel: There is no appreciable bowel wall or mesenteric thickening. There is no evident bowel obstruction. The terminal ileum appears unremarkable. There is fatty infiltration in the ileocecal valve. Vascular/Lymphatic: There is aortic atherosclerosis. No aneurysm evident. Major mesenteric arterial vessels are patent. Major venous structures are patent. There is no appreciable adenopathy in the abdomen or pelvis. Reproductive:  Prostate and seminal vesicles are normal in size and contour. No pelvic mass evident. Other: Appendix appears normal. There is no evident ascites or abscess in the abdomen or pelvis. There is mild fat in the umbilicus. Musculoskeletal: There are no blastic or lytic bone lesions. No intramuscular lesions are evident. Surgical clips are noted in the posterior right pelvic wall and involving the right gluteus maximus muscle. Review of the MIP images confirms the above findings. IMPRESSION: 1. No demonstrable pulmonary embolus. No thoracic aortic aneurysm or dissection. There is aortic atherosclerosis as well as multiple foci of coronary artery calcification. 2. Widespread pulmonary metastatic lesions throughout the lungs involving most lobes in segments. Largest individual masses in the posterior segment of the left upper lobe abutting the pleura measuring 4.7 x 4.2 cm. A mass in the inferior lingula measures 4.1 x 3.2 cm. No edema or airspace opacity. 3. Several borderline prominent lymph nodes, likely of neoplastic etiology. CT abdomen and pelvis: 1. There is a mass arising in the upper pole left kidney measuring 1.3 x 1.4 cm which has attenuation values higher than is expected with a cyst. Question potential left renal metastasis or possibly small primary left renal carcinoma. 2.  No adenopathy in the  abdomen or pelvis. 3. No bowel wall thickening or bowel obstruction. No abscess in the abdomen or pelvis. Appendix appears normal. 4. No renal or ureteral calculus. No hydronephrosis. Urinary bladder empty. 5.  Hepatic steatosis.  No focal liver lesions are evident. 6.  Gallbladder absent. 7.  Aortic atherosclerosis. Aortic Atherosclerosis (ICD10-I70.0). Electronically Signed   By: Lowella Grip III M.D.   On: 04/24/2019 13:57   NM Bone Scan Whole Body  Result Date: 05/02/2019 CLINICAL DATA:  History of surgical resection of a left leg sarcoma with multiple pulmonary metastases seen on a recent chest CTA. There is also a left renal mass on an abdomen and pelvis CT. EXAM: NUCLEAR MEDICINE WHOLE BODY BONE SCAN TECHNIQUE: Whole body anterior and posterior images were obtained approximately 3 hours after intravenous injection of radiopharmaceutical. RADIOPHARMACEUTICALS:  20 mCi Technetium-43m MDP IV COMPARISON:  Chest CTA and abdomen and pelvis CT dated 04/24/2019 FINDINGS: Increased tracer activity at both acromioclavicular joints, both elbows, both wrists, both sternoclavicular joints, both knees and both ankles and proximal feet. Focally increased tracer uptake in the region of the left 1st MTP joint and distal right 2nd toe. No abnormal tracer uptake elsewhere in the bony skeleton. Normal renal and bladder activity. IMPRESSION: 1. No evidence of bony metastatic disease. 2. Multi joint degenerative changes. Electronically Signed   By: Claudie Revering M.D.   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 Cough and shortness of breath EXAM: CT ANGIOGRAPHY CHEST CT ABDOMEN AND PELVIS WITH CONTRAST TECHNIQUE: Multidetector CT imaging of the chest was performed using the standard protocol during bolus administration of intravenous contrast. Multiplanar CT image reconstructions  and MIPs were obtained to evaluate the vascular anatomy. Multidetector CT imaging of the abdomen and pelvis was performed using the standard protocol during bolus administration of intravenous contrast. CONTRAST:  119mL OMNIPAQUE IOHEXOL 350 MG/ML SOLN COMPARISON:  Chest radiograph April 24, 2019 FINDINGS: CTA CHEST FINDINGS Cardiovascular: There is no demonstrable pulmonary embolus. There is no thoracic aortic aneurysm or dissection. The visualized great vessels appear normal. There are multiple foci coronary artery calcification. There are foci of aortic atherosclerosis. No pericardial effusion or  pericardial thickening evident. Mediastinum/Nodes: Thyroid appears somewhat inhomogeneous without dominant mass. There is a lymph node in the left hilum measuring 1.3 x 1.3 cm. There is a lymph node to the left of the carina measuring 1.2 x 1.0 cm. There are several subcentimeter mediastinal lymph nodes, largest measuring 1.0 x 1.0 cm. No esophageal lesions are evident. Lungs/Pleura: Widespread pulmonary metastatic disease noted with multiple nodular opacities seen throughout the lungs. The largest individual nodular lesion is seen in the posterior segment of the left upper lobe abutting the pleura measuring 4.7 x 4.2 cm. Nodular lesions elsewhere range in size from as small as 5 mm to as large as 4.1 x 3.2 cm. This nodular opacity is noted in the inferior lingula. There is no airspace consolidation or edema. No pleural effusions are evident. Musculoskeletal: There are foci of degenerative change in the thoracic spine. There are no blastic or lytic bone lesions. No chest wall lesions evident. Review of the MIP images confirms the above findings. CT ABDOMEN and PELVIS FINDINGS Hepatobiliary: There is hepatic steatosis. No focal liver lesions are demonstrable. Gallbladder wall is not appreciably thickened. There is no biliary duct dilatation. Pancreas: There is no pancreatic mass or inflammatory focus. Spleen: No splenic  lesions are evident. Adrenals/Urinary Tract: Adrenals bilaterally appear normal. There is a mass arising in the periphery of the upper pole left kidney measuring 1.3 x 1.4 cm. This mass has attenuation values higher than is expected with a cyst. There is an 8 x 8 mm cyst in the lower pole the left kidney. There is no evident hydronephrosis on either side. There is no evident renal or ureteral calculus on either side. Urinary bladder is midline and essentially completely empty. Stomach/Bowel: There is no appreciable bowel wall or mesenteric thickening. There is no evident bowel obstruction. The terminal ileum appears unremarkable. There is fatty infiltration in the ileocecal valve. Vascular/Lymphatic: There is aortic atherosclerosis. No aneurysm evident. Major mesenteric arterial vessels are patent. Major venous structures are patent. There is no appreciable adenopathy in the abdomen or pelvis. Reproductive: Prostate and seminal vesicles are normal in size and contour. No pelvic mass evident. Other: Appendix appears normal. There is no evident ascites or abscess in the abdomen or pelvis. There is mild fat in the umbilicus. Musculoskeletal: There are no blastic or lytic bone lesions. No intramuscular lesions are evident. Surgical clips are noted in the posterior right pelvic wall and involving the right gluteus maximus muscle. Review of the MIP images confirms the above findings. IMPRESSION: 1. No demonstrable pulmonary embolus. No thoracic aortic aneurysm or dissection. There is aortic atherosclerosis as well as multiple foci of coronary artery calcification. 2. Widespread pulmonary metastatic lesions throughout the lungs involving most lobes in segments. Largest individual masses in the posterior segment of the left upper lobe abutting the pleura measuring 4.7 x 4.2 cm. A mass in the inferior lingula measures 4.1 x 3.2 cm. No edema or airspace opacity. 3. Several borderline prominent lymph nodes, likely of  neoplastic etiology. CT abdomen and pelvis: 1. There is a mass arising in the upper pole left kidney measuring 1.3 x 1.4 cm which has attenuation values higher than is expected with a cyst. Question potential left renal metastasis or possibly small primary left renal carcinoma. 2.  No adenopathy in the abdomen or pelvis. 3. No bowel wall thickening or bowel obstruction. No abscess in the abdomen or pelvis. Appendix appears normal. 4. No renal or ureteral calculus. No hydronephrosis. Urinary bladder empty. 5.  Hepatic  steatosis.  No focal liver lesions are evident. 6.  Gallbladder absent. 7.  Aortic atherosclerosis. Aortic Atherosclerosis (ICD10-I70.0). Electronically Signed   By: Lowella Grip III M.D.   On: 04/24/2019 13:57   ECHOCARDIOGRAM COMPLETE  Result Date: 05/02/2019    ECHOCARDIOGRAM REPORT   Patient Name:   Leonard [name] Date of Exam: 05/02/2019 Medical Rec #:  [id number]     Height:       69.0 in Accession #:    [id number]    Weight:       245.2 lb Date of Birth:  May 29, 1960     BSA:          2.253 m Patient Age:    38 years      BP:           126/82 mmHg Patient Gender: M             HR:           73 bpm. Exam Location:  [facility] Procedure: 2D Echo Indications:    Chemotherapy evaluation v87.41 / v58.11  History:        Patient has no prior history of Echocardiogram examinations.                 CAD; Risk Factors:Former Smoker, Diabetes and Hypertension.                 Liposarcoma of left lower extremity.  Sonographer:    Leavy Cella RDCS (AE) Referring Phys: [place]  1. Left ventricular ejection fraction, by estimation, is 60 to 65%. The left ventricle has normal function. The left ventricle has no regional wall motion abnormalities. There is moderate concentric left ventricular hypertrophy. Left ventricular diastolic parameters are consistent with Grade I diastolic dysfunction (impaired relaxation).  2. Right ventricular systolic function is normal. The right  ventricular size is normal.  3. The mitral valve is grossly normal. No evidence of mitral valve regurgitation.  4. The aortic valve was not well visualized. Aortic valve regurgitation is not visualized. No aortic stenosis is present.  5. The inferior vena cava is normal in size with greater than 50% respiratory variability, suggesting right atrial pressure of 3 mmHg. FINDINGS  Left Ventricle: Left ventricular ejection fraction, by estimation, is 60 to 65%. The left ventricle has normal function. The left ventricle has no regional wall motion abnormalities. The left ventricular internal cavity size was normal in size. There is  moderate concentric left ventricular hypertrophy. Left ventricular diastolic parameters are consistent with Grade I diastolic dysfunction (impaired relaxation). Normal left ventricular filling pressure. Right Ventricle: The right ventricular size is normal. No increase in right ventricular wall thickness. Right ventricular systolic function is normal. Left Atrium: Left atrial size was normal in size. Right Atrium: Right atrial size was normal in size. Pericardium: There is no evidence of pericardial effusion. Mitral Valve: The mitral valve is grossly normal. No evidence of mitral valve regurgitation. Tricuspid Valve: The tricuspid valve is grossly normal. Tricuspid valve regurgitation is trivial. Aortic Valve: The aortic valve was not well visualized. Aortic valve regurgitation is not visualized. No aortic stenosis is present. Pulmonic Valve: The pulmonic valve was not well visualized. Pulmonic valve regurgitation is not visualized. Aorta: The aortic root is normal in size and structure. Venous: The inferior vena cava is normal in size with greater than 50% respiratory variability, suggesting right atrial pressure of 3 mmHg. IAS/Shunts: No atrial level shunt detected by color flow Doppler.  LEFT  VENTRICLE PLAX 2D LVIDd:         4.27 cm  Diastology LVIDs:         2.75 cm  LV e' lateral:   9.25  cm/s LV PW:         1.38 cm  LV E/e' lateral: 6.3 LV IVS:        1.27 cm  LV e' medial:    4.90 cm/s LVOT diam:     2.00 cm  LV E/e' medial:  12.0 LVOT Area:     3.14 cm  RIGHT VENTRICLE RV S prime:     13.50 cm/s TAPSE (M-mode): 2.3 cm LEFT ATRIUM             Index LA diam:        3.40 cm 1.51 cm/m LA Vol (A2C):   62.9 ml 27.92 ml/m LA Vol (A4C):   41.7 ml 18.51 ml/m LA Biplane Vol: 52.6 ml 23.35 ml/m   AORTA Ao Root diam: 3.20 cm MITRAL VALVE MV Area (PHT): 3.37 cm    SHUNTS MV Decel Time: 225 msec    Systemic Diam: 2.00 cm MV E velocity: 58.70 cm/s MV A velocity: 60.00 cm/s MV E/A ratio:  0.98 Kate Sable MD Electronically signed by Kate Sable MD Signature Date/Time: 05/02/2019/10:04:51 AM    Final    IR IMAGING GUIDED PORT INSERTION  Result Date: 05/05/2019 CLINICAL DATA:  Metastatic liposarcoma, needs durable venous access for planned chemotherapy regimen EXAM: TUNNELED PORT CATHETER PLACEMENT WITH ULTRASOUND AND FLUOROSCOPIC GUIDANCE FLUOROSCOPY TIME:  6 seconds; 2 mGy ANESTHESIA/SEDATION: Intravenous Fentanyl 140mcg and Versed 2mg  were administered as conscious sedation during continuous monitoring of the patient's level of consciousness and physiological / cardiorespiratory status by the radiology RN, with a total moderate sedation time of 12 minutes. TECHNIQUE: The procedure, risks, benefits, and alternatives were explained to the patient. Questions regarding the procedure were encouraged and answered. The patient understands and consents to the procedure. As antibiotic prophylaxis, cefazolin 2 g was ordered pre-procedure and administered intravenously within one hour of incision. Patency of the right IJ vein was confirmed with ultrasound with image documentation. An appropriate skin site was determined. Skin site was marked. Region was prepped using maximum barrier technique including cap and mask, sterile gown, sterile gloves, large sterile sheet, and Chlorhexidine as cutaneous  antisepsis. The region was infiltrated locally with 1% lidocaine. Under real-time ultrasound guidance, the right IJ vein was accessed with a 21 gauge micropuncture needle; the needle tip within the vein was confirmed with ultrasound image documentation. Needle was exchanged over a 018 guidewire for transitional dilator, and vascular measurement was performed. A small incision was made on the right anterior chest wall and a subcutaneous pocket fashioned. The power-injectable port was positioned and its catheter tunneled to the right IJ dermatotomy site. The transitional dilator was exchanged over an Amplatz wire for a peel-away sheath, through which the port catheter, which had been trimmed to the appropriate length, was advanced and positioned under fluoroscopy with its tip at the cavoatrial junction. Spot chest radiograph confirms good catheter position and no pneumothorax. The port was flushed per protocol. The pocket was closed with deep interrupted and subcuticular continuous 3-0 Monocryl sutures. The incisions were covered with Dermabond then covered with a sterile dressing. The patient tolerated the procedure well. COMPLICATIONS: COMPLICATIONS None immediate IMPRESSION: Technically successful right IJ power-injectable port catheter placement. Ready for routine use. Electronically Signed   By: Lucrezia Europe M.D.   On: 05/05/2019 07:23  Labs:  CBC: Recent Labs    08/02/18 0547 04/24/19 1151 05/04/19 1345 05/08/19 0658  WBC 5.4 8.3 7.3 5.8  HGB 14.3 15.0 14.9 14.3  HCT 42.7 45.4 45.6 42.9  PLT 216 333 344 332    COAGS: Recent Labs    05/04/19 1345  INR 0.9    BMP: Recent Labs    08/01/18 1211 08/02/18 0547 04/24/19 1151  NA 141 138 137  K 3.8 3.8 3.9  CL 108 107 102  CO2 24 24 24   GLUCOSE 109* 99 135*  BUN 13 14 15   CALCIUM 9.1 8.6* 9.1  CREATININE 0.92 0.79 0.99  GFRNONAA >60 >60 >60  GFRAA >60 >60 >60    LIVER FUNCTION TESTS: Recent Labs    04/24/19 1151  BILITOT  0.8  AST 18  ALT 25  ALKPHOS 125  PROT 7.7  ALBUMIN 4.0    TUMOR MARKERS: No results for input(s): AFPTM, CEA, CA199, CHROMGRNA in the last 8760 hours.  Assessment and Plan:  Patient with a history of metastatic liposarcoma with plans to begin systemic chemotherapy as management. Multiple Lung masses For CT guided LUL biopsy today Labs ok Risks and benefits of lung mass bx was discussed with the patient and/or patient's family including, but not limited to bleeding, infection, damage to adjacent structures or low yield requiring additional tests.  All of the questions were answered and there is agreement to proceed.  Consent signed and in chart.    Thank you for this interesting consult.  I greatly enjoyed meeting Leonard [name] and look forward to participating in their care.  A copy of this report was sent to the requesting provider on this date.  Electronically Signed: Ascencion Dike, PA-C 05/08/2019, 7:36 AM   I spent a total of 30 min. in face to face in clinical consultation, greater than 50% of which was counseling/coordinating care for image-guided lung mass biopsy

## 2019-05-08 NOTE — Discharge Instructions (Signed)
Moderate Conscious Sedation, Adult, Care After These instructions provide you with information about caring for yourself after your procedure. Your health care provider may also give you more specific instructions. Your treatment has been planned according to current medical practices, but problems sometimes occur. Call your health care provider if you have any problems or questions after your procedure. What can I expect after the procedure? After your procedure, it is common:  To feel sleepy for several hours.  To feel clumsy and have poor balance for several hours.  To have poor judgment for several hours.  To vomit if you eat too soon. Follow these instructions at home: For at least 24 hours after the procedure:   Do not: ? Participate in activities where you could fall or become injured. ? Drive. ? Use heavy machinery. ? Drink alcohol. ? Take sleeping pills or medicines that cause drowsiness. ? Make important decisions or sign legal documents. ? Take care of children on your own.  Rest. Eating and drinking  Follow the diet recommended by your health care provider.  If you vomit: ? Drink water, juice, or soup when you can drink without vomiting. ? Make sure you have little or no nausea before eating solid foods. General instructions  Have a responsible adult stay with you until you are awake and alert.  Take over-the-counter and prescription medicines only as told by your health care provider.  If you smoke, do not smoke without supervision.  Keep all follow-up visits as told by your health care provider. This is important. Contact a health care provider if:  You keep feeling nauseous or you keep vomiting.  You feel light-headed.  You develop a rash.  You have a fever. Get help right away if:  You have trouble breathing. This information is not intended to replace advice given to you by your health care provider. Make sure you discuss any questions you have  with your health care provider. Document Revised: 12/11/2016 Document Reviewed: 04/20/2015 Elsevier Patient Education  [place] After This sheet gives you information about how to care for yourself after your procedure. Your health care provider may also give you more specific instructions depending on the type of biopsy you had. If you have problems or questions, contact your health care provider. What can I expect after the procedure? After the procedure, it is common to have:  A cough.  A sore throat.  Pain where a needle, bronchoscope, or incision was used to collect a biopsy sample (biopsy site). Follow these instructions at home: Medicines  Take over-the-counter and prescription medicines only as told by your health care provider.  Do not drink alcohol if your health care provider tells you not to drink.  Ask your health care provider if the medicine prescribed to you: ? Requires you to avoid driving or using heavy machinery. ? Can cause constipation. You may need to take these actions to prevent or treat constipation:  Drink enough fluid to keep your urine pale yellow.  Take over-the-counter or prescription medicines.  Eat foods that are high in fiber, such as beans, whole grains, and fresh fruits and vegetables.  Limit foods that are high in fat and processed sugars, such as fried or sweet foods.  Do not drive for 24 hours if you were given a sedative. Biopsy site care   Follow instructions from your health care provider about how to take care of your biopsy site. Make sure you: ? Wash your hands  with soap and water before and after you change your bandage (dressing). If soap and water are not available, use hand sanitizer. ? Change your dressing as told by your health care provider. ? Leave stitches (sutures), skin glue, or adhesive strips in place. These skin closures may need to stay in place for 2 weeks or longer. If adhesive strip edges  start to loosen and curl up, you may trim the loose edges. Do not remove adhesive strips completely unless your health care provider tells you to do that.  Do not take baths, swim, or use a hot tub until your health care provider approves. Ask your health care provider if you may take showers. You may only be allowed to take sponge baths.  Check your biopsy site every day for signs of infection. Check for: ? Redness, swelling, or more pain. ? Fluid or blood. ? Warmth. ? Pus or a bad smell. General instructions  Return to your normal activities as told by your health care provider. Ask your health care provider what activities are safe for you.  It is up to you to get the results of your procedure. Ask your health care provider, or the department that is doing the procedure, when your results will be ready.  Keep all follow-up visits as told by your health care provider. This is important. Contact a health care provider if:  You have a fever.  You have redness, swelling, or more pain around your biopsy site.  You have fluid or blood coming from your biopsy site.  Your biopsy site feels warm to the touch.  You have pus or a bad smell coming from your biopsy site.  You have pain that does not get better with medicine. Get help right away if:  You cough up blood.  You have trouble breathing.  You have chest pain.  You lose consciousness. Summary  After the procedure, it is common to have a sore throat and a cough.  Return to your normal activities as told by your health care provider. Ask your health care provider what activities are safe for you.  Take over-the-counter and prescription medicines only as told by your health care provider.  Report any unusual symptoms to your health care provider. This information is not intended to replace advice given to you by your health care provider. Make sure you discuss any questions you have with your health care provider. Document  Revised: 02/02/2018 Document Reviewed: 01/28/2016 Elsevier Patient Education  [place].

## 2019-05-08 NOTE — Progress Notes (Addendum)
Discharge instructions reviewed with pt and his wife (via telephone) both voice understanding.  

## 2019-05-09 LAB — SURGICAL PATHOLOGY

## 2019-05-11 ENCOUNTER — Ambulatory Visit (HOSPITAL_COMMUNITY): Payer: 59 | Admitting: Hematology

## 2019-05-15 ENCOUNTER — Other Ambulatory Visit: Payer: Self-pay

## 2019-05-15 ENCOUNTER — Inpatient Hospital Stay (HOSPITAL_COMMUNITY): Payer: 59 | Attending: Hematology | Admitting: Hematology

## 2019-05-15 ENCOUNTER — Encounter (HOSPITAL_COMMUNITY): Payer: Self-pay | Admitting: Hematology

## 2019-05-15 DIAGNOSIS — R05 Cough: Secondary | ICD-10-CM | POA: Insufficient documentation

## 2019-05-15 DIAGNOSIS — C4922 Malignant neoplasm of connective and soft tissue of left lower limb, including hip: Secondary | ICD-10-CM | POA: Diagnosis not present

## 2019-05-15 DIAGNOSIS — Z87891 Personal history of nicotine dependence: Secondary | ICD-10-CM | POA: Diagnosis not present

## 2019-05-15 DIAGNOSIS — Z5111 Encounter for antineoplastic chemotherapy: Secondary | ICD-10-CM | POA: Diagnosis present

## 2019-05-15 DIAGNOSIS — C499 Malignant neoplasm of connective and soft tissue, unspecified: Secondary | ICD-10-CM | POA: Diagnosis not present

## 2019-05-15 DIAGNOSIS — Z7189 Other specified counseling: Secondary | ICD-10-CM

## 2019-05-15 DIAGNOSIS — R519 Headache, unspecified: Secondary | ICD-10-CM | POA: Diagnosis not present

## 2019-05-15 DIAGNOSIS — R0602 Shortness of breath: Secondary | ICD-10-CM | POA: Diagnosis not present

## 2019-05-15 DIAGNOSIS — Z9221 Personal history of antineoplastic chemotherapy: Secondary | ICD-10-CM | POA: Diagnosis not present

## 2019-05-15 DIAGNOSIS — F418 Other specified anxiety disorders: Secondary | ICD-10-CM | POA: Insufficient documentation

## 2019-05-15 DIAGNOSIS — Z79899 Other long term (current) drug therapy: Secondary | ICD-10-CM | POA: Insufficient documentation

## 2019-05-15 MED ORDER — HYDROCODONE-HOMATROPINE 5-1.5 MG/5ML PO SYRP: ORAL_SOLUTION | ORAL | 0 refills | Status: DC

## 2019-05-15 NOTE — Progress Notes (Signed)
START OFF PATHWAY REGIMEN - Other   [id number]:Doxorubicin 75 mg/m2 IV D1 q21 Days:   A cycle is every 21 days:     Doxorubicin   **Always confirm dose/schedule in your pharmacy ordering system**  Patient Characteristics: Intent of Therapy: Non-Curative / Palliative Intent, Discussed with Patient

## 2019-05-15 NOTE — Assessment & Plan Note (Signed)
1.  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 -He underwent chemoradiation therapy with gemcitabine and docetaxel followed by wide local excision. -Pathology on 11/10/2018 showed liposarcoma with 80% necrosis, grade 2, YPT4NX, positive deep margin, amplification of MDM 2 detected.  Amplification is associated with well-differentiated liposarcoma/atypical lipomatous tumors and dedifferentiated liposarcoma. -CT CAP in the ER on 04/24/2019 showed widespread pulmonary metastatic lesions throughout the lungs, largest left upper lobe mass measuring 4.7 x 4.2 cm.  Mass in the inferior lingula measures 4.1 x 3.2 cm. -Bone scan on 05/02/2019 did not show any evidence of metastatic disease. -2D echo on 05/02/2019 shows EF 60-65%. -I have reached out to sarcoma specialist Dr. Angelina Ok at [facility].  There are 2 trials for upfront treatment which are in the regulatory process and will not likely be approved until 6 months.  He has recommended single agent Adriamycin rather than Adriamycin and ifosfamide. -We will plan to start him on Adriamycin 75 mg per metered squared every 21 days. -We will plan to do imaging after 3 cycles.  If he has partial response, I will continue all until 6 cycles.  After that we will do close surveillance. -I will also make a referral to Dr. Angelina Ok for formal consultation.   2.  Dry cough: -He is taking hydrocodone syrup for the dry cough which is helping particularly at nighttime. -I have sent a refill for it.  3.  Anxiety: -He will continue Xanax 0.25 mg 2-3 times a day as needed.  4.  Left kidney lesion: -CT on 04/24/2019 showed mass arising in the periphery of the upper pole of the left kidney measuring 1.3 x 1.4 cm. -I have compared with CT scan from [facility] in August 2020.  Mass is stable.

## 2019-05-15 NOTE — Patient Instructions (Addendum)
[place] at [facility] Discharge Instructions  You were seen today by Dr. Delton Coombes. He went over your recent lab and test results.  Thank you for choosing [place] at [facility] to provide your oncology and hematology care.  To afford each patient quality time with our provider, please arrive at least 15 minutes before your scheduled appointment time.   If you have a lab appointment with the [facility] please come in thru the  Main Entrance and check in at the main information desk  You need to re-schedule your appointment should you arrive 10 or more minutes late.  We strive to give you quality time with our providers, and arriving late affects you and other patients whose appointments are after yours.  Also, if you no show three or more times for appointments you may be dismissed from the clinic at the providers discretion.     Again, thank you for choosing [facility].  Our hope is that these requests will decrease the amount of time that you wait before being seen by our physicians.       _____________________________________________________________  Should you have questions after your visit to [facility], please contact our office at [phone] between the hours of 8:00 a.m. and 4:30 p.m.  Voicemails left after 4:00 p.m. will not be returned until the following business day.  For prescription refill requests, have your pharmacy contact our office and allow 72 hours.    Cancer Center Support Programs:   > Cancer Support Group  2nd Tuesday of the month 1pm-2pm, Journey Room

## 2019-05-15 NOTE — Progress Notes (Signed)
Leonard [name], [place] [address]   CLINIC:  Medical Oncology/Hematology  PCP:  Leonard Squibb, MD [place] [address] [phone]   REASON FOR VISIT:  Follow-up for metastatic dedifferentiated liposarcoma.  PRIOR THERAPY: Gemcitabine and docetaxel with radiation followed by wide local excision on 11/10/2018.  NGS Results: Not done.  CURRENT THERAPY: Adriamycin every 21 days.  BRIEF ONCOLOGIC HISTORY:  Oncology History  Liposarcoma of left lower extremity ([place])  04/25/2019 Initial Diagnosis   Liposarcoma of left lower extremity ([place])   05/22/2019 -  Chemotherapy   The patient had DOXOrubicin (ADRIAMYCIN) chemo injection 174 mg, 75 mg/m2, Intravenous,  Once, 0 of 6 cycles PALONOSETRON HCL INJECTION 0.25 MG/5ML, 0.25 mg, Intravenous,  Once, 0 of 6 cycles FOSAPREPITANT IV INFUSION 150 MG, 150 mg, Intravenous,  Once, 0 of 6 cycles  for chemotherapy treatment.    Liposarcoma ([place])  05/15/2019 Initial Diagnosis   Liposarcoma ([place])   05/15/2019 Cancer Staging   Staging form: Primary Cutaneous B-Cell/T-Cell Lymphoma (Non-MF/SS Lymphoma), AJCC 8th Edition - Clinical stage from 05/15/2019: Leonard [name], pM1 - Signed by Leonard Jack, MD on 05/15/2019     CANCER STAGING: Cancer Staging Liposarcoma [facility]) Staging form: Primary Cutaneous B-Cell/T-Cell Lymphoma (Non-MF/SS Lymphoma), AJCC 8th Edition - Clinical stage from 05/15/2019: Leonard [name], pM1 - Signed by Leonard Jack, MD on 05/15/2019  Liposarcoma of left lower extremity ([place]) Staging form: Primary Cutaneous B-Cell/T-Cell Lymphoma (Non-MF/SS Lymphoma), AJCC 8th Edition - Clinical: No stage assigned - Unsigned    INTERVAL HISTORY:  Leonard [name] 59 y.o. male seen for follow-up of metastatic liposarcoma.  Reports appetite 75%.  Energy levels are low.  Reports cough which is dry, mostly at nighttime.  He is taking hydrocodone syrup which is helping with the cough  predominantly at nighttime.  He also reports shortness of breath with exertion.  Chronic headaches are stable.  Denies any symptoms of PND or orthopnea.  He also had port placed.    REVIEW OF SYSTEMS:  Review of Systems  Respiratory: Positive for cough and shortness of breath.   Neurological: Positive for headaches.  Psychiatric/Behavioral: Positive for depression. The patient is nervous/anxious.   All other systems reviewed and are negative.    PAST MEDICAL/SURGICAL HISTORY:  Past Medical History:  Diagnosis Date  . Cancer [facility])    Past Surgical History:  Procedure Laterality Date  . CHOLECYSTECTOMY    . ESOPHAGEAL DILATION N/A 06/27/2017   Procedure: ESOPHAGEAL DILATION;  Surgeon: Leonard Binder, MD;  Location: [facility] ENDO SUITE;  Service: Endoscopy;  Laterality: N/A;  . ESOPHAGOGASTRODUODENOSCOPY N/A 06/27/2017   Procedure: ESOPHAGOGASTRODUODENOSCOPY (EGD);  Surgeon: Leonard Binder, MD;  Location: [facility] ENDO SUITE;  Service: Endoscopy;  Laterality: N/A;  . IR IMAGING GUIDED PORT INSERTION  05/04/2019  . knee sx       SOCIAL HISTORY:  Social History   Socioeconomic History  . Marital status: Married    Spouse name: Not on file  . Number of children: 1  . Years of education: Not on file  . Highest education level: Not on file  Occupational History  . Occupation: Unemployed  Tobacco Use  . Smoking status: Former Research scientist (life sciences)  . Smokeless tobacco: Never Used  Substance and Sexual Activity  . Alcohol use: Yes    Comment: 1-2 drinks per month  . Drug use: Never  . Sexual activity: Not Currently  Other Topics Concern  . Not on file  Social History  Narrative   DOES CONSTRUCTION. MARRIED. RARE ETOH. NO TOBACCO.   Social Determinants of Health   Financial Resource Strain: Medium Risk  . Difficulty of Paying Living Expenses: Somewhat hard  Food Insecurity: No Food Insecurity  . Worried About Charity fundraiser in the Last Year: Never true  . Ran Out of Food in the Last Year:  Never true  Transportation Needs: No Transportation Needs  . Lack of Transportation (Medical): No  . Lack of Transportation (Non-Medical): No  Physical Activity: Inactive  . Days of Exercise per Week: 0 days  . Minutes of Exercise per Session: 0 min  Stress: Stress Concern Present  . Feeling of Stress : To some extent  Social Connections: Slightly Isolated  . Frequency of Communication with Friends and Family: More than three times a week  . Frequency of Social Gatherings with Friends and Family: Once a week  . Attends Religious Services: More than 4 times per year  . Active Member of Clubs or Organizations: No  . Attends Archivist Meetings: Never  . Marital Status: Married  Human resources officer Violence: Not At Risk  . Fear of Current or Ex-Partner: No  . Emotionally Abused: No  . Physically Abused: No  . Sexually Abused: No    FAMILY HISTORY:  Family History  Problem Relation Age of Onset  . Heart disease Father   . Cancer Maternal Aunt   . Cancer Maternal Uncle   . Cancer Paternal Uncle   . Vision loss Maternal Grandfather   . Colon cancer Neg Hx   . Colon polyps Neg Hx     CURRENT MEDICATIONS:  Outpatient Encounter Medications as of 05/15/2019  Medication Sig  . ALPRAZolam (XANAX) 0.25 MG tablet Take 1 tablet (0.25 mg total) by mouth 2 (two) times daily as needed for anxiety.  . benzonatate (TESSALON) 100 MG capsule Take 2 capsules (200 mg total) by mouth 3 (three) times daily as needed. (Patient not taking: Reported on 05/15/2019)  . HYDROcodone-homatropine (HYCODAN) 5-1.5 MG/5ML syrup SMARTSIG:1 Teaspoon By Mouth Every 6 Hours PRN  . Pseudoeph-Doxylamine-DM-APAP (DAYQUIL/NYQUIL COLD/FLU RELIEF PO) Take 30 mLs by mouth in the morning and at bedtime. Takes 30ml of dayquil each morning and 76ml of nyquil at bedtime.  . [DISCONTINUED] HYDROcodone-homatropine (HYCODAN) 5-1.5 MG/5ML syrup SMARTSIG:1 Teaspoon By Mouth Every 6 Hours PRN   No facility-administered  encounter medications on file as of 05/15/2019.    ALLERGIES:  No Known Allergies   PHYSICAL EXAM:  ECOG Performance status: 1  Vitals:   05/15/19 1150  BP: 109/77  Pulse: 86  Resp: 20  Temp: (!) 97.5 F (36.4 C)  SpO2: 96%   Filed Weights   05/15/19 1150  Weight: 244 lb 14.4 oz (111.1 kg)   Physical Exam Vitals reviewed.  Constitutional:      Appearance: Normal appearance.  HENT:     Head: Normocephalic and atraumatic.  Skin:    General: Skin is warm.  Neurological:     General: No focal deficit present.     Mental Status: He is alert and oriented to person, place, and time.  Psychiatric:        Mood and Affect: Mood normal.        Behavior: Behavior normal.      LABORATORY DATA:  I have reviewed the labs as listed.  CBC    Component Value Date/Time   WBC 5.8 05/08/2019 0658   RBC 4.64 05/08/2019 0658   HGB 14.3 05/08/2019 0658  HCT 42.9 05/08/2019 0658   PLT 332 05/08/2019 0658   MCV 92.5 05/08/2019 0658   MCH 30.8 05/08/2019 0658   MCHC 33.3 05/08/2019 0658   RDW 13.2 05/08/2019 0658   LYMPHSABS 1.0 05/08/2019 0658   MONOABS 0.6 05/08/2019 0658   EOSABS 0.3 05/08/2019 0658   BASOSABS 0.0 05/08/2019 0658   CMP Latest Ref Rng & Units 04/24/2019 08/02/2018 08/01/2018  Glucose 70 - 99 mg/dL 135(H) 99 109(H)  BUN 6 - 20 mg/dL 15 14 13   Creatinine 0.61 - 1.24 mg/dL 0.99 0.79 0.92  Sodium 135 - 145 mmol/L 137 138 141  Potassium 3.5 - 5.1 mmol/L 3.9 3.8 3.8  Chloride 98 - 111 mmol/L 102 107 108  CO2 22 - 32 mmol/L 24 24 24   Calcium 8.9 - 10.3 mg/dL 9.1 8.6(L) 9.1  Total Protein 6.5 - 8.1 g/dL 7.7 - -  Total Bilirubin 0.3 - 1.2 mg/dL 0.8 - -  Alkaline Phos 38 - 126 U/L 125 - -  AST 15 - 41 U/L 18 - -  ALT 0 - 44 U/L 25 - -    DIAGNOSTIC IMAGING:  I have independently reviewed the scans and discussed with the patient.  ASSESSMENT & PLAN:  Liposarcoma ([place]) 1.  Metastatic dedifferentiated liposarcoma: -Left calf mass biopsy on 08/09/2018 consistent  with grade 2 liposarcoma, CHOP gene rearrangement showed gene not disrupted. -He underwent chemoradiation therapy with gemcitabine and docetaxel followed by wide local excision. -Pathology on 11/10/2018 showed liposarcoma with 80% necrosis, grade 2, YPT4NX, positive deep margin, amplification of MDM 2 detected.  Amplification is associated with well-differentiated liposarcoma/atypical lipomatous tumors and dedifferentiated liposarcoma. -CT CAP in the ER on 04/24/2019 showed widespread pulmonary metastatic lesions throughout the lungs, largest left upper lobe mass measuring 4.7 x 4.2 cm.  Mass in the inferior lingula measures 4.1 x 3.2 cm. -Bone scan on 05/02/2019 did not show any evidence of metastatic disease. -2D echo on 05/02/2019 shows EF 60-65%. -I have reached out to sarcoma specialist Dr. Angelina [facility] at [facility].  There are 2 trials for upfront treatment which are in the regulatory process and will not likely be approved until 6 months.  He has recommended single agent Adriamycin rather than Adriamycin and ifosfamide. -We will plan to start him on Adriamycin 75 mg per metered squared every 21 days. -We will plan to do imaging after 3 cycles.  If he has partial response, I will continue all until 6 cycles.  After that we will do close surveillance. -I will also make a referral to Dr. Angelina Ok for formal consultation.   2.  Dry cough: -He is taking hydrocodone syrup for the dry cough which is helping particularly at nighttime. -I have sent a refill for it.  3.  Anxiety: -He will continue Xanax 0.25 mg 2-3 times a day as needed.  4.  Left kidney lesion: -CT on 04/24/2019 showed mass arising in the periphery of the upper pole of the left kidney measuring 1.3 x 1.4 cm. -I have compared with CT scan from [facility] in August 2020.  Mass is stable.     Orders placed this encounter:  No orders of the defined types were placed in this encounter.  Total time spent is 40 minutes with more than  50% of the time spent face-to-face discussing scan results, treatment plan, counseling and coordination of care.   Leonard Jack, MD [place] [phone]

## 2019-05-16 ENCOUNTER — Encounter (HOSPITAL_COMMUNITY): Payer: Self-pay | Admitting: *Deleted

## 2019-05-16 ENCOUNTER — Other Ambulatory Visit (HOSPITAL_COMMUNITY): Payer: Self-pay | Admitting: *Deleted

## 2019-05-16 DIAGNOSIS — C499 Malignant neoplasm of connective and soft tissue, unspecified: Secondary | ICD-10-CM

## 2019-05-16 NOTE — Progress Notes (Signed)
I talked with patient and his wife today.  I explained to them that [facility] does not currently have any clinical trials available for enrollment.  I explained that the provider from [facility] has recommended only single agent Adriamycin.  I reviewed the side effects with him and explained that he will get a formal chemotherapy education class.  He verbalizes that he wants to do it on the same day, he does not want to come back for another visit until he is actually starting treatments. I explained that we will teach him at bedside.  He verbalizes acceptance of that plan.

## 2019-05-18 NOTE — Progress Notes (Signed)
.   Pharmacist Chemotherapy Monitoring - Initial Assessment    Anticipated start date: 05/22/2019   Regimen:  . Are orders appropriate based on the patient's diagnosis, regimen, and cycle? Yes . Does the plan date match the patient's scheduled date? Yes . Is the sequencing of drugs appropriate? Yes . Are the premedications appropriate for the patient's regimen? Yes . Prior Authorization for treatment is: Approved o If applicable, is the correct biosimilar selected based on the patient's insurance? not applicable  Organ Function and Labs: Marland Kitchen Are dose adjustments needed based on the patient's renal function, hepatic function, or hematologic function? No . Are appropriate labs ordered prior to the start of patient's treatment? Yes . Other organ system assessment, if indicated: anthracyclines: Echo/ MUGA . The following baseline labs, if indicated, have been ordered: N/A  Dose Assessment: . Are the drug doses appropriate? Yes . Are the following correct: o Drug concentrations Yes o IV fluid compatible with drug Yes o Administration routes Yes o Timing of therapy Yes . If applicable, does the patient have documented access for treatment and/or plans for port-a-cath placement? yes . If applicable, have lifetime cumulative doses been properly documented and assessed? not applicable Lifetime Dose Tracking  No doses have been documented on this patient for the following tracked chemicals: Doxorubicin, Epirubicin, Idarubicin, Daunorubicin, Mitoxantrone, Bleomycin, Oxaliplatin, Carboplatin, Liposomal Doxorubicin  o   Toxicity Monitoring/Prevention: . The patient has the following take home antiemetics prescribed: Needs orders . The patient has the following take home medications prescribed: Needs orders . Medication allergies and previous infusion related reactions, if applicable, have been reviewed and addressed. Yes . The patient's current medication list has been assessed for drug-drug  interactions with their chemotherapy regimen. no significant drug-drug interactions were identified on review.  Order Review: . Are the treatment plan orders signed? No . Is the patient scheduled to see a provider prior to their treatment? Yes  I verify that I have reviewed each item in the above checklist and answered each question accordingly.  Wynona Neat 05/18/2019 4:10 PM

## 2019-05-19 ENCOUNTER — Encounter (HOSPITAL_COMMUNITY): Payer: Self-pay

## 2019-05-19 DIAGNOSIS — Z95828 Presence of other vascular implants and grafts: Secondary | ICD-10-CM

## 2019-05-19 HISTORY — DX: Presence of other vascular implants and grafts: Z95.828

## 2019-05-19 MED ORDER — PROCHLORPERAZINE MALEATE 10 MG PO TABS: 10.0000 mg | ORAL_TABLET | Freq: Four times a day (QID) | ORAL | 1 refills | Status: DC | PRN

## 2019-05-19 MED ORDER — LIDOCAINE-PRILOCAINE 2.5-2.5 % EX CREA: TOPICAL_CREAM | CUTANEOUS | 3 refills | Status: DC

## 2019-05-19 NOTE — Patient Instructions (Signed)
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  You will be treated in the clinic every 3 weeks with a chemotherapy drug called doxorubicin (Adriamycin).  The intent of treatment is to control your cancer, prevent it from spreading further, and to alleviate any symptoms you may be having related to your disease.  You will see the doctor regularly throughout treatment.  We will obtain blood work from you prior to every treatment and monitor your results to make sure it is safe to give your treatment. The doctor monitors your response to treatment by the way you are feeling, your blood work, and by obtaining scans periodically.  There will be wait times while you are here for treatment.  It will take about 30 minutes to 1 hour for your lab work to result. Then there will be wait times while pharmacy mixes your medications.    Medications you will receive in the clinic prior to your chemotherapy medications:  Aloxi:  ALOXI is used in adults to help prevent nausea and vomiting that happens with certain chemotherapy drugs.  Aloxi is a long acting medication, and will remain in your system for about two days.   Emend:  This is an anti-nausea medication that is used with Aloxi to help prevent nausea and vomiting caused by chemotherapy.  Dexamethasone:  This is a steroid given prior to chemotherapy to help prevent allergic reactions; it may also help prevent and control nausea and diarrhea.    Doxorubicin (Adriamycin)  About This Drug Doxorubicin is used to treat cancer. It is given in the vein (IV).  It is given as an IV push and will take 10-15 minutes to administer.  Possible Side Effects . Soreness of the mouth and throat. You may have red areas, white patches, or sores in your mouth that hurt.  . Nausea and vomiting (throwing up)  . Diarrhea (loose bowel movements)  . Hair loss. Hair loss is often temporary, although with certain  medicine, hair loss can sometimes be permanent. Hair loss may happen suddenly or gradually. If you lose hair, you may lose it from your head, face, armpits, pubic area, chest, and/or legs. You may also notice your hair getting thin.  Note: Not all possible side effects are included above.  Warnings and Precautions . Skin and tissue irritation including redness, pain, warmth, or swelling at the IV site if the drug leaks out of the vein and into nearby tissue. Very rarely it may cause tissue necrosis (tissue death).  . Changes in the tissue of the heart and/or congestive heart failure. You may be short of breath. Your arms, hands, legs and feet may swell.  . Abnormal heartbeat which can be life-threatening  . This drug may raise your risk of getting a second cancer, such as leukemia and myelodysplastic syndrome.  . Severe bone marrow suppression. This is a decrease in the number of white blood cells, red blood cells, and platelets. This may raise your risk of infection, make you tired and weak (fatigue), and raise your risk of bleeding.  . If you have received radiation treatments, your skin may become red after doxorubicin. This reaction is called "radiation recall." Your body is recalling, or remembering, that it had radiation therapy.  . Tumor lysis syndrome: This drug may act on the cancer cells very quickly. This may affect how your kidneys work.  Note: Some of the side effects above are very rare. If you have concerns  and/or questions, please discuss them with your medical team.  Important Information . This drug may be present in the saliva, tears, sweat, urine, stool, vomit, semen, and vaginal secretions. Talk to your doctor and/or your nurse about the necessary precautions to take during this time.  . Urine color may be slightly orange or reddish starting several hours after you get this drug. This will slowly go away within one to two days.  Treating Side Effects . Manage  tiredness by pacing your activities for the day.  . Be sure to include periods of rest between energy-draining activities.  . To decrease the risk of infection, wash your hands regularly.  . Avoid close contact with people who have a cold, the flu, or other infections.  . Take your temperature as your doctor or nurse tells you, and whenever you feel like you may have a fever.  . To help decrease the risk of bleeding, use a soft toothbrush. Check with your nurse before using dental floss.  . Be very careful when using knives or tools.  . Use an electric shaver instead of a razor.  . Drink plenty of fluids (a minimum of eight glasses per day is recommended).  . If you throw up or have loose bowel movements, you should drink more fluids so that you do not become dehydrated (lack of water in the body from losing too much fluid).  . If you have diarrhea, eat low-fiber foods that are high in protein and calories and avoid foods that can irritate your digestive tracts or lead to cramping.  . Ask your nurse or doctor about medicine that can lessen or stop your diarrhea.  . To help with nausea and vomiting, eat small, frequent meals instead of three large meals a day. Choose foods and drinks that are at room temperature. Ask your nurse or doctor about other helpful tips and medicine that is available to help stop or lessen these symptoms.  . Mouth care is very important. Your mouth care should consist of routine, gentle cleaning of your teeth or dentures and rinsing your mouth with a mixture of 1/2 teaspoon of salt in 8 ounces of water or 1/2 teaspoon of baking soda in 8 ounces of water. This should be done at least after each meal and at bedtime.  . If you have mouth sores, avoid mouthwash that has alcohol. Also avoid alcohol and smoking because they can bother your mouth and throat.  Marland Kitchen Keeping your pain under control is important to your well-being. Please tell your doctor or nurse if you are  experiencing pain.  . To help with possible signs of early menopause, vaginal lubricants can be used to lessen vaginal dryness, itching, and pain during sexual relations.  . To help with hair loss, wash with a mild shampoo and avoid washing your hair every day.  . Avoid rubbing your scalp, pat your hair or scalp dry.  . Avoid coloring your hair.  . Limit your use of hair spray, electric curlers, blow dryers, and curling irons.  . If you are interested in getting a wig, talk to your nurse. You can also call the [place] at [phone] to find out information about the "Look Good, Feel Better" program close to where you live. It is a free program where women getting chemotherapy can learn about wigs, turbans and scarves as well as makeup techniques and skin and nail care.  . If you received radiation, and your skin becomes red or  irritated again, follow the same care instructions you did during radiation treatment. Be sure to tell the nurse or doctor administering your chemotherapy about your skin changes.  Food and Drug Interactions . This drug may interact with grapefruit and grapefruit juice. Talk to your doctor as this could make side effects worse.  . Check with your doctor or pharmacist about all other prescription medicines and over-the-counter medicines and dietary supplements (vitamins, minerals, herbs and others) you are taking before starting this medicine as there are known drug interactions with doxorubicin. Also, check with your doctor or pharmacist before starting any new prescription or over-the-counter medicines, or dietary supplements to make sure that there are no interactions.  . Avoid the use of St. John's Wort while taking doxorubicin as this may lower the levels of the drug in your body, which can make it less effective  When to Call the Doctor Call your doctor or nurse if you have any of these symptoms and/or any new or unusual symptoms:  . Fever of  100.4 F (38 C) or higher  . Chills  . Feeling dizzy or lightheaded  . Trouble breathing  . Feeling that your heart is beating fast or in a not normal way (palpitations)  . Tiredness that interferes with your daily activities  . Easy bleeding or bruising  . Diarrhea, 4 times in one day or diarrhea with lack of strength or a feeling of being dizzy  . Pain in your mouth or throat that makes it hard to eat or drink  . Nausea that stops you from eating or drinking and/or is not relieved by prescribed medicines  . Throwing up  . Swelling of arms, legs, ankles, or feet  . Weight gain of 5 pounds in one week (fluid retention)  . Signs of tumor lysis: Confusion or agitation, decreased urine, nausea/vomiting, diarrhea, muscle cramping, numbness and/or tingling, seizures.  . While you are getting this drug, please tell your nurse right away if you have any pain, redness, or swelling at the site of the IV infusion.  . If you think you may be pregnant or may have impregnated your partner  Reproduction Warnings  . Pregnancy warning: This drug can have harmful effects on the unborn baby. Women of childbearing potential should use effective methods of birth control during your cancer treatment and for 6 months after treatment. Men with male partners of childbearing potential should use effective methods of birth control during your cancer treatment and for 3 months after your cancer treatment. Men with pregnant partners should use condoms during your cancer treatment and for at least 10 days after your cancer treatment. Let your doctor know right away if you think you may be pregnant or may have impregnated your partner. In women, menstrual bleeding may become irregular or stop while you are getting this drug. Do not assume that you cannot become pregnant if you do not have a menstrual period.  . Women may go through signs of menopause (change of life) like vaginal dryness or itching.  .  Breastfeeding warning: Women should not breastfeed during treatment and for 10 days after treatment because this drug could enter the breast milk and cause harm to a breastfeeding baby.  . Fertility warning: In men and women both, this drug may affect your ability to have children in the future. Talk with your doctor or nurse if you plan to have children. Ask for information on sperm or egg banking.  SELF CARE ACTIVITIES WHILE RECEIVING CHEMOTHERAPY:  Hydration Increase your fluid intake 48 hours prior to treatment and drink at least 8 to 12 cups (64 ounces) of water/decaffeinated beverages per day after treatment. You can still have your cup of coffee or soda but these beverages do not count as part of your 8 to 12 cups that you need to drink daily. No alcohol intake.  Medications Continue taking your normal prescription medication as prescribed.  If you start any new herbal or new supplements please let us know first to make sure it is safe.  Mouth Care Have teeth cleaned professionally before starting treatment. Keep dentures and partial plates clean. Use soft toothbrush and do not use mouthwashes that contain alcohol. Biotene is a good mouthwash that is available at most pharmacies or may be ordered by calling [phone]. Use warm salt water gargles (1 teaspoon salt per 1 quart warm water) before and after meals and at bedtime. If you need dental work, please let the doctor know before you go for your appointment so that we can coordinate the best possible time for you in regards to your chemo regimen. You need to also let your dentist know that you are actively taking chemo. We may need to do labs prior to your dental appointment.  Skin Care Always use sunscreen that has not expired and with SPF (Sun Protection Factor) of 50 or higher. Wear hats to protect your head from the sun. Remember to use sunscreen on your hands, ears, face, & feet.  Use good moisturizing lotions such as udder cream,  eucerin, or even Vaseline. Some chemotherapies can cause dry skin, color changes in your skin and nails.    . Avoid long, hot showers or baths. . Use gentle, fragrance-free soaps and laundry detergent. . Use moisturizers, preferably creams or ointments rather than lotions because the thicker consistency is better at preventing skin dehydration. Apply the cream or ointment within 15 minutes of showering. Reapply moisturizer at night, and moisturize your hands every time after you wash them.  Hair Loss (if your doctor says your hair will fall out)  . If your doctor says that your hair is likely to fall out, decide before you begin chemo whether you want to wear a wig. You may want to shop before treatment to match your hair color. . Hats, turbans, and scarves can also camouflage hair loss, although some people prefer to leave their heads uncovered. If you go bare-headed outdoors, be sure to use sunscreen on your scalp. . Cut your hair short. It eases the inconvenience of shedding lots of hair, but it also can reduce the emotional impact of watching your hair fall out. . Don't perm or color your hair during chemotherapy. Those chemical treatments are already damaging to hair and can enhance hair loss. Once your chemo treatments are done and your hair has grown back, it's OK to resume dyeing or perming hair.  With chemotherapy, hair loss is almost always temporary. But when it grows back, it may be a different color or texture. In older adults who still had hair color before chemotherapy, the new growth may be completely gray.  Often, new hair is very fine and soft.  Infection Prevention Please wash your hands for at least 30 seconds using warm soapy water. Handwashing is the #1 way to prevent the spread of germs. Stay away from sick people or people who are getting over a cold. If you develop respiratory systems such as green/yellow mucus production or productive cough or persistent cough  let us know  and we will see if you need an antibiotic. It is a good idea to keep a pair of gloves on when going into grocery stores/[employer] to decrease your risk of coming into contact with germs on the carts, etc. Carry alcohol hand gel with you at all times and use it frequently if out in public. If your temperature reaches 100.4 or higher please call the clinic and let us know.  If it is after hours or on the weekend please go to the ER if your temperature is over 100.4.  Please have your own personal thermometer at home to use.    Sex and bodily fluids If you are going to have sex, a condom must be used to protect the person that isn't taking chemotherapy. Chemo can decrease your libido (sex drive). For a few days after chemotherapy, chemotherapy can be excreted through your bodily fluids.  When using the toilet please close the lid and flush the toilet twice.  Do this for a few day after you have had chemotherapy.   Effects of chemotherapy on your sex life Some changes are simple and won't last long. They won't affect your sex life permanently.  Sometimes you may feel: . too tired . not strong enough to be very active . sick or sore  . not in the mood . anxious or low  Your anxiety might not seem related to sex. For example, you may be worried about the cancer and how your treatment is going. Or you may be worried about money, or about how you family are coping with your illness.  These things can cause stress, which can affect your interest in sex. It's important to talk to your partner about how you feel.  Remember - the changes to your sex life don't usually last long. There's usually no medical reason to stop having sex during chemo. The drugs won't have any long term physical effects on your performance or enjoyment of sex. Cancer can't be passed on to your partner during sex  Contraception It's important to use reliable contraception during treatment. Avoid getting pregnant while you or your partner  are having chemotherapy. This is because the drugs may harm the baby. Sometimes chemotherapy drugs can leave a man or woman infertile.  This means you would not be able to have children in the future. You might want to talk to someone about permanent infertility. It can be very difficult to learn that you may no longer be able to have children. Some people find counselling helpful. There might be ways to preserve your fertility, although this is easier for men than for women. You may want to speak to a fertility expert. You can talk about sperm banking or harvesting your eggs. You can also ask about other fertility options, such as donor eggs. If you have or have had breast cancer, your doctor might advise you not to take the contraceptive pill. This is because the hormones in it might affect the cancer. It is not known for sure whether or not chemotherapy drugs can be passed on through semen or secretions from the vagina. Because of this some doctors advise people to use a barrier method if you have sex during treatment. This applies to vaginal, anal or oral sex. Generally, doctors advise a barrier method only for the time you are actually having the treatment and for about a week after your treatment. Advice like this can be worrying, but this does not mean that  you have to avoid being intimate with your partner. You can still have close contact with your partner and continue to enjoy sex.  Animals If you have cats or birds we just ask that you not change the litter or change the cage.  Please have someone else do this for you while you are on chemotherapy.   Food Safety During and After Cancer Treatment Food safety is important for people both during and after cancer treatment. Cancer and cancer treatments, such as chemotherapy, radiation therapy, and stem cell/bone marrow transplantation, often weaken the immune system. This makes it harder for your body to protect itself from foodborne illness, also  called food poisoning. Foodborne illness is caused by eating food that contains harmful bacteria, parasites, or viruses.  Foods to avoid Some foods have a higher risk of becoming tainted with bacteria. These include: Marland Kitchen Unwashed fresh fruit and vegetables, especially leafy vegetables that can hide dirt and other contaminants . Raw sprouts, such as alfalfa sprouts . Raw or undercooked beef, especially ground beef, or other raw or undercooked meat and poultry . Fatty, fried, or spicy foods immediately before or after treatment.  These can sit heavy on your stomach and make you feel nauseous. . Raw or undercooked shellfish, such as oysters. . Sushi and sashimi, which often contain raw fish.  . Unpasteurized beverages, such as unpasteurized fruit juices, raw milk, raw yogurt, or cider . Undercooked eggs, such as soft boiled, over easy, and poached; raw, unpasteurized eggs; or foods made with raw egg, such as homemade raw cookie dough and homemade mayonnaise  Simple steps for food safety  Shop smart. . Do not buy food stored or displayed in an unclean area. . Do not buy bruised or damaged fruits or vegetables. . Do not buy cans that have cracks, dents, or bulges. . Pick up foods that can spoil at the end of your shopping trip and store them in a cooler on the way home.  Prepare and clean up foods carefully. . Rinse all fresh fruits and vegetables under running water, and dry them with a clean towel or paper towel. . Clean the top of cans before opening them. . After preparing food, wash your hands for 20 seconds with hot water and soap. Pay special attention to areas between fingers and under nails. . Clean your utensils and dishes with hot water and soap. Marland Kitchen Disinfect your kitchen and cutting boards using 1 teaspoon of liquid, unscented bleach mixed into 1 quart of water.    Dispose of old food. . Eat canned and packaged food before its expiration date (the "use by" or "best before"  date). . Consume refrigerated leftovers within 3 to 4 days. After that time, throw out the food. Even if the food does not smell or look spoiled, it still may be unsafe. Some bacteria, such as Listeria, can grow even on foods stored in the refrigerator if they are kept for too long.  Take precautions when eating out. . At restaurants, avoid buffets and salad bars where food sits out for a long time and comes in contact with many people. Food can become contaminated when someone with a virus, often a norovirus, or another "bug" handles it. . Put any leftover food in a "to-go" container yourself, rather than having the server do it. And, refrigerate leftovers as soon as you get home. . Choose restaurants that are clean and that are willing to prepare your food as you order it cooked.   AT  HOME MEDICATIONS:                                                                                                                                                                Compazine/Prochlorperazine 10mg  tablet. Take 1 tablet every 6 hours as needed for nausea/vomiting. (This can make you sleepy)   EMLA cream. Apply a quarter size amount to port site 1 hour prior to chemo. Do not rub in. Cover with plastic wrap.    Diarrhea Sheet   If you are having loose stools/diarrhea, please purchase Imodium and begin taking as outlined:  At the first sign of poorly formed or loose stools you should begin taking Imodium (loperamide) 2 mg capsules.  Take two tablets (4mg ) followed by one tablet (2mg ) every 2 hours - DO NOT EXCEED 8 tablets in 24 hours.  If it is bedtime and you are having loose stools, take 2 tablets at bedtime, then 2 tablets every 4 hours until morning.   Always call the [facility] if you are having loose stools/diarrhea that you can't get under control.  Loose stools/diarrhea leads to dehydration (loss of water) in your body.  We have other options of trying to get the loose stools/diarrhea to  stop but you must let us know!   Constipation Sheet  Colace - 100 mg capsules - take 2 capsules daily.  If this doesn't help then you can increase to 2 capsules twice daily.  Please call if the above does not work for you. Do not go more than 2 days without a bowel movement.  It is very important that you do not become constipated.  It will make you feel sick to your stomach (nausea) and can cause abdominal pain and vomiting.  Nausea Sheet   Compazine/Prochlorperazine 10mg  tablet. Take 1 tablet every 6 hours as needed for nausea/vomiting (This can make you drowsy).  If you are having persistent nausea (nausea that does not stop) please call the [facility] and let us know the amount of nausea that you are experiencing.  If you begin to vomit, you need to call the [facility] and if it is the weekend and you have vomited more than one time and can't get it to stop-go to the Emergency Room.  Persistent nausea/vomiting can lead to dehydration (loss of fluid in your body) and will make you feel very weak and unwell. Ice chips, sips of clear liquids, foods that are at room temperature, crackers, and toast tend to be better tolerated.   SYMPTOMS TO REPORT AS SOON AS POSSIBLE AFTER TREATMENT:  FEVER GREATER THAN 100.4 F  CHILLS WITH OR WITHOUT FEVER  NAUSEA AND VOMITING THAT IS NOT CONTROLLED WITH YOUR NAUSEA MEDICATION  UNUSUAL SHORTNESS OF BREATH  UNUSUAL BRUISING OR BLEEDING  TENDERNESS IN MOUTH AND THROAT WITH OR WITHOUT PRESENCE OF ULCERS  URINARY PROBLEMS  BOWEL PROBLEMS  UNUSUAL RASH      Wear comfortable clothing and clothing appropriate for easy access to any Portacath or PICC line. Let us know if there is anything that we can do to make your therapy better!    What to do if you need assistance after hours or on the weekends: CALL [phone].  HOLD on the line, do not hang up.  You will hear multiple messages but at the end you will be connected with a nurse triage  line.  They will contact the doctor if necessary.  Most of the time they will be able to assist you.  Do not call the hospital operator.      I have been informed and understand all of the instructions given to me and have received a copy. I have been instructed to call the clinic [phone] or my family physician as soon as possible for continued medical care, if indicated. I do not have any more questions at this time but understand that I may call the [facility] or the Patient Navigator at [phone] during office hours should I have questions or need assistance in obtaining follow-up care.

## 2019-05-22 ENCOUNTER — Inpatient Hospital Stay (HOSPITAL_COMMUNITY): Payer: 59

## 2019-05-22 ENCOUNTER — Inpatient Hospital Stay (HOSPITAL_COMMUNITY): Payer: 59 | Admitting: Hematology

## 2019-05-22 ENCOUNTER — Other Ambulatory Visit: Payer: Self-pay

## 2019-05-22 VITALS — BP 117/72 | HR 64 | Temp 96.6°F | Resp 16 | Wt 243.7 lb

## 2019-05-22 DIAGNOSIS — Z5111 Encounter for antineoplastic chemotherapy: Secondary | ICD-10-CM | POA: Diagnosis not present

## 2019-05-22 DIAGNOSIS — Z95828 Presence of other vascular implants and grafts: Secondary | ICD-10-CM

## 2019-05-22 DIAGNOSIS — C4922 Malignant neoplasm of connective and soft tissue of left lower limb, including hip: Secondary | ICD-10-CM

## 2019-05-22 DIAGNOSIS — C499 Malignant neoplasm of connective and soft tissue, unspecified: Secondary | ICD-10-CM

## 2019-05-22 LAB — CBC WITH DIFFERENTIAL/PLATELET
Abs Immature Granulocytes: 0.04 10*3/uL (ref 0.00–0.07)
Basophils Absolute: 0 10*3/uL (ref 0.0–0.1)
Basophils Relative: 0 %
Eosinophils Absolute: 0.6 10*3/uL — ABNORMAL HIGH (ref 0.0–0.5)
Eosinophils Relative: 8 %
HCT: 41.6 % (ref 39.0–52.0)
Hemoglobin: 13.5 g/dL (ref 13.0–17.0)
Immature Granulocytes: 1 %
Lymphocytes Relative: 14 %
Lymphs Abs: 1 10*3/uL (ref 0.7–4.0)
MCH: 30.3 pg (ref 26.0–34.0)
MCHC: 32.5 g/dL (ref 30.0–36.0)
MCV: 93.5 fL (ref 80.0–100.0)
Monocytes Absolute: 0.6 10*3/uL (ref 0.1–1.0)
Monocytes Relative: 8 %
Neutro Abs: 4.7 10*3/uL (ref 1.7–7.7)
Neutrophils Relative %: 69 %
Platelets: 253 10*3/uL (ref 150–400)
RBC: 4.45 MIL/uL (ref 4.22–5.81)
RDW: 13.1 % (ref 11.5–15.5)
WBC: 6.9 10*3/uL (ref 4.0–10.5)
nRBC: 0 % (ref 0.0–0.2)

## 2019-05-22 LAB — COMPREHENSIVE METABOLIC PANEL
ALT: 29 U/L (ref 0–44)
AST: 29 U/L (ref 15–41)
Albumin: 3.8 g/dL (ref 3.5–5.0)
Alkaline Phosphatase: 109 U/L (ref 38–126)
Anion gap: 9 (ref 5–15)
BUN: 13 mg/dL (ref 6–20)
CO2: 26 mmol/L (ref 22–32)
Calcium: 8.8 mg/dL — ABNORMAL LOW (ref 8.9–10.3)
Chloride: 102 mmol/L (ref 98–111)
Creatinine, Ser: 0.81 mg/dL (ref 0.61–1.24)
GFR calc Af Amer: >60 mL/min (ref >60)
GFR calc non Af Amer: >60 mL/min (ref >60)
Glucose, Bld: 162 mg/dL — ABNORMAL HIGH (ref 70–99)
Potassium: 3.4 mmol/L — ABNORMAL LOW (ref 3.5–5.1)
Sodium: 137 mmol/L (ref 135–145)
Total Bilirubin: 0.8 mg/dL (ref 0.3–1.2)
Total Protein: 7.1 g/dL (ref 6.5–8.1)

## 2019-05-22 MED ORDER — SODIUM CHLORIDE 0.9 % IV SOLN: Freq: Once | INTRAVENOUS | Status: AC

## 2019-05-22 MED ORDER — HEPARIN SOD (PORK) LOCK FLUSH 100 UNIT/ML IV SOLN
500.0000 [IU] | Freq: Once | INTRAVENOUS | Status: AC | PRN
  Administered 2019-05-22: 500 [IU]

## 2019-05-22 MED ORDER — PALONOSETRON HCL INJECTION 0.25 MG/5ML
0.2500 mg | Freq: Once | INTRAVENOUS | Status: AC
  Administered 2019-05-22: 0.25 mg via INTRAVENOUS
  Filled 2019-05-22: qty 5

## 2019-05-22 MED ORDER — SODIUM CHLORIDE 0.9 % IV SOLN
150.0000 mg | Freq: Once | INTRAVENOUS | Status: AC
  Administered 2019-05-22: 150 mg via INTRAVENOUS
  Filled 2019-05-22: qty 150

## 2019-05-22 MED ORDER — POTASSIUM CHLORIDE CRYS ER 20 MEQ PO TBCR
20.0000 meq | EXTENDED_RELEASE_TABLET | Freq: Once | ORAL | Status: AC
  Administered 2019-05-22: 20 meq via ORAL
  Filled 2019-05-22: qty 1

## 2019-05-22 MED ORDER — SODIUM CHLORIDE 0.9 % IV SOLN
10.0000 mg | Freq: Once | INTRAVENOUS | Status: AC
  Administered 2019-05-22: 10 mg via INTRAVENOUS
  Filled 2019-05-22: qty 10

## 2019-05-22 MED ORDER — SODIUM CHLORIDE 0.9% FLUSH
10.0000 mL | INTRAVENOUS | Status: DC | PRN
  Administered 2019-05-22: 10 mL

## 2019-05-22 MED ORDER — DOXORUBICIN HCL CHEMO IV INJECTION 2 MG/ML
75.0000 mg/m2 | Freq: Once | INTRAVENOUS | Status: AC
  Administered 2019-05-22: 174 mg via INTRAVENOUS
  Filled 2019-05-22: qty 87

## 2019-05-22 NOTE — Progress Notes (Signed)
Labs reviewed today with MD . Proceed with treatment today as planned.    Treatment given per orders. Patient tolerated it well without problems. Vitals stable and discharged home from clinic ambulatory. Follow up as scheduled.

## 2019-05-22 NOTE — Patient Instructions (Addendum)
  [place] at [facility] Discharge Instructions  You were seen today by Dr. Delton Coombes. He went over your recent lab results. You will have your first treatment today.  He will see you back in 1 week for labs and follow up.   Thank you for choosing [place] at [facility] to provide your oncology and hematology care.  To afford each patient quality time with our provider, please arrive at least 15 minutes before your scheduled appointment time.   If you have a lab appointment with the [place] [place] please come in thru the  Main Entrance and check in at the main information desk  You need to re-schedule your appointment should you arrive 10 or more minutes late.  We strive to give you quality time with our providers, and arriving late affects you and other patients whose appointments are after yours.  Also, if you no show three or more times for appointments you may be dismissed from the clinic at the providers discretion.     Again, thank you for choosing [facility].  Our hope is that these requests will decrease the amount of time that you wait before being seen by our physicians.       _____________________________________________________________  Should you have questions after your visit to [facility], please contact our office at [phone] between the hours of 8:00 a.m. and 4:30 p.m.  Voicemails left after 4:00 p.m. will not be returned until the following business day.  For prescription refill requests, have your pharmacy contact our office and allow 72 hours.    Cancer Center Support Programs:   > Cancer Support Group  2nd Tuesday of the month 1pm-2pm, Journey Room

## 2019-05-22 NOTE — Progress Notes (Signed)
[address] [name], [place] [id number]   CLINIC:  Medical Oncology/Hematology  PCP:  Celene Squibb, MD [place] [address] [phone]   REASON FOR VISIT:  Follow-up for metastatic dedifferentiated liposarcoma.  PRIOR THERAPY: Gemcitabine and docetaxel with radiation followed by wide local excision on 11/10/2018.  NGS Results: Not done.  CURRENT THERAPY: Adriamycin every 21 days.  BRIEF ONCOLOGIC HISTORY:  Oncology History  Liposarcoma of left lower extremity ([place])  04/25/2019 Initial Diagnosis   Liposarcoma of left lower extremity ([place])   05/22/2019 -  Chemotherapy   The patient had DOXOrubicin (ADRIAMYCIN) chemo injection 174 mg, 75 mg/m2 = 174 mg, Intravenous,  Once, 1 of 6 cycles Administration: 174 mg (05/22/2019) palonosetron (ALOXI) injection 0.25 mg, 0.25 mg, Intravenous,  Once, 1 of 6 cycles Administration: 0.25 mg (05/22/2019) fosaprepitant (EMEND) 150 mg in sodium chloride 0.9 % 145 mL IVPB, 150 mg, Intravenous,  Once, 1 of 6 cycles Administration: 150 mg (05/22/2019)  for chemotherapy treatment.    Liposarcoma ([place])  05/15/2019 Initial Diagnosis   Liposarcoma ([place])   05/15/2019 Cancer Staging   Staging form: Primary Cutaneous B-Cell/T-Cell Lymphoma (Non-MF/SS Lymphoma), AJCC 8th Edition - Clinical stage from 05/15/2019: [name], pM1 - Signed by [name], MD on 05/15/2019     CANCER STAGING: Cancer Staging [facility]) Staging form: Primary Cutaneous B-Cell/T-Cell Lymphoma (Non-MF/SS Lymphoma), AJCC 8th Edition - Clinical stage from 05/15/2019: [name], pM1 - Signed by [name], MD on 05/15/2019  Liposarcoma of left lower extremity ([place]) Staging form: Primary Cutaneous B-Cell/T-Cell Lymphoma (Non-MF/SS Lymphoma), AJCC 8th Edition - Clinical: No stage assigned - Unsigned    INTERVAL HISTORY:  Mr. Trulson 59 y.o. male seen for follow-up of metastatic liposarcoma.  He is accompanied by  his wife today.  He had port placed.  Appetite is 50%.  Energy levels are 25%.  Cough is fairly well controlled with hydrocodone syrup.  Positive for constipation.    REVIEW OF SYSTEMS:  Review of Systems  Respiratory: Positive for cough and shortness of breath.   Gastrointestinal: Positive for constipation.  All other systems reviewed and are negative.    PAST MEDICAL/SURGICAL HISTORY:  Past Medical History:  Diagnosis Date  . Cancer ([place])   . Port-A-Cath in place 05/19/2019   Past Surgical History:  Procedure Laterality Date  . CHOLECYSTECTOMY    . ESOPHAGEAL DILATION N/A 06/27/2017   Procedure: ESOPHAGEAL DILATION;  Surgeon: Danie Binder, MD;  Location: [facility];  Service: Endoscopy;  Laterality: N/A;  . ESOPHAGOGASTRODUODENOSCOPY N/A 06/27/2017   Procedure: ESOPHAGOGASTRODUODENOSCOPY (EGD);  Surgeon: Danie Binder, MD;  Location: [facility];  Service: Endoscopy;  Laterality: N/A;  . IR IMAGING GUIDED PORT INSERTION  05/04/2019  . knee sx       SOCIAL HISTORY:  Social History   Socioeconomic History  . Marital status: Married    Spouse name: Not on file  . Number of children: 1  . Years of education: Not on file  . Highest education level: Not on file  Occupational History  . Occupation: Unemployed  Tobacco Use  . Smoking status: Former Research scientist (life sciences)  . Smokeless tobacco: Never Used  Substance and Sexual Activity  . Alcohol use: Yes    Comment: 1-2 drinks per month  . Drug use: Never  . Sexual activity: Not Currently  Other Topics Concern  . Not on file  Social History Narrative   DOES CONSTRUCTION. MARRIED. RARE ETOH.  NO TOBACCO.   Social Determinants of Health   Financial Resource Strain: Medium Risk  . Difficulty of Paying Living Expenses: Somewhat hard  Food Insecurity: No Food Insecurity  . Worried About Charity fundraiser in the Last Year: Never true  . Ran Out of Food in the Last Year: Never true  Transportation Needs: No Transportation Needs    . Lack of Transportation (Medical): No  . Lack of Transportation (Non-Medical): No  Physical Activity: Inactive  . Days of Exercise per Week: 0 days  . Minutes of Exercise per Session: 0 min  Stress: Stress Concern Present  . Feeling of Stress : To some extent  Social Connections: Slightly Isolated  . Frequency of Communication with Friends and Family: More than three times a week  . Frequency of Social Gatherings with Friends and Family: Once a week  . Attends Religious Services: More than 4 times per year  . Active Member of Clubs or Organizations: No  . Attends Archivist Meetings: Never  . Marital Status: Married  Human resources officer Violence: Not At Risk  . Fear of Current or Ex-Partner: No  . Emotionally Abused: No  . Physically Abused: No  . Sexually Abused: No    FAMILY HISTORY:  Family History  Problem Relation Age of Onset  . Heart disease Father   . Cancer Maternal Aunt   . Cancer Maternal Uncle   . Cancer Paternal Uncle   . Vision loss Maternal Grandfather   . Colon cancer Neg Hx   . Colon polyps Neg Hx     CURRENT MEDICATIONS:  Outpatient Encounter Medications as of 05/22/2019  Medication Sig  . DOXORUBICIN HCL IV Inject 75 mg/m2 into the vein every 21 ( twenty-one) days.  Marland Kitchen lidocaine-prilocaine (EMLA) cream Apply a small amount to port a cath site and cover with plastic wrap 1 hour prior to chemotherapy appointments  . Pseudoeph-Doxylamine-DM-APAP (DAYQUIL/NYQUIL COLD/FLU RELIEF PO) Take 30 mLs by mouth in the morning and at bedtime. Takes 54ml of dayquil each morning and 18ml of nyquil at bedtime.  . ALPRAZolam (XANAX) 0.25 MG tablet Take 1 tablet (0.25 mg total) by mouth 2 (two) times daily as needed for anxiety. (Patient not taking: Reported on 05/22/2019)  . benzonatate (TESSALON) 100 MG capsule Take 2 capsules (200 mg total) by mouth 3 (three) times daily as needed. (Patient not taking: Reported on 05/15/2019)  . diphenhydrAMINE (BENADRYL) 12.5 MG/5ML  liquid Take 6.25 mg by mouth as needed for itching.  Marland Kitchen HYDROcodone-homatropine (HYCODAN) 5-1.5 MG/5ML syrup SMARTSIG:1 Teaspoon By Mouth Every 6 Hours PRN (Patient not taking: Reported on 05/22/2019)  . prochlorperazine (COMPAZINE) 10 MG tablet Take 1 tablet (10 mg total) by mouth every 6 (six) hours as needed (Nausea or vomiting). (Patient not taking: Reported on 05/22/2019)  . triamcinolone cream (KENALOG) 0.1 % APPLY TO AFFECTED AREACTHREE (3) TIMES DAILY AS NEEDED FOR RASH.   No facility-administered encounter medications on file as of 05/22/2019.    ALLERGIES:  No Known Allergies   PHYSICAL EXAM:  ECOG Performance status: 1  There were no vitals filed for this visit. There were no vitals filed for this visit. Physical Exam Vitals reviewed.  Constitutional:      Appearance: Normal appearance.  HENT:     Head: Normocephalic and atraumatic.  Cardiovascular:     Heart sounds: Normal heart sounds.  Pulmonary:     Effort: Pulmonary effort is normal.     Breath sounds: Normal breath sounds.  Skin:  General: Skin is warm.  Neurological:     General: No focal deficit present.     Mental Status: He is alert and oriented to person, place, and time.  Psychiatric:        Mood and Affect: Mood normal.        Behavior: Behavior normal.      LABORATORY DATA:  I have reviewed the labs as listed.  CBC    Component Value Date/Time   WBC 6.9 05/22/2019 0940   RBC 4.45 05/22/2019 0940   HGB 13.5 05/22/2019 0940   HCT 41.6 05/22/2019 0940   PLT 253 05/22/2019 0940   MCV 93.5 05/22/2019 0940   MCH 30.3 05/22/2019 0940   MCHC 32.5 05/22/2019 0940   RDW 13.1 05/22/2019 0940   LYMPHSABS 1.0 05/22/2019 0940   MONOABS 0.6 05/22/2019 0940   EOSABS 0.6 (H) 05/22/2019 0940   BASOSABS 0.0 05/22/2019 0940   CMP Latest Ref Rng & Units 05/22/2019 04/24/2019 08/02/2018  Glucose 70 - 99 mg/dL 162(H) 135(H) 99  BUN 6 - 20 mg/dL 13 15 14   Creatinine 0.61 - 1.24 mg/dL 0.81 0.99 0.79  Sodium  135 - 145 mmol/L 137 137 138  Potassium 3.5 - 5.1 mmol/L 3.4(L) 3.9 3.8  Chloride 98 - 111 mmol/L 102 102 107  CO2 22 - 32 mmol/L 26 24 24   Calcium 8.9 - 10.3 mg/dL 8.8(L) 9.1 8.6(L)  Total Protein 6.5 - 8.1 g/dL 7.1 7.7 -  Total Bilirubin 0.3 - 1.2 mg/dL 0.8 0.8 -  Alkaline Phos 38 - 126 U/L 109 125 -  AST 15 - 41 U/L 29 18 -  ALT 0 - 44 U/L 29 25 -    DIAGNOSTIC IMAGING:  I have reviewed the scans.  ASSESSMENT & PLAN:  Liposarcoma ([place]) 1.  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 -Underwent chemoradiation therapy with gemcitabine and docetaxel followed by wide local excision. -Pathology on 11/10/2018 showed liposarcoma with 80% necrosis, grade 2, YPT4NX, positive deep margin, amplification of MDM 2 detected.  Amplification is associated with well-differentiated liposarcoma/atypical lipomatous tumors and dedifferentiated liposarcoma. -CT CAP in the ER on 04/24/2019 showed widespread lung nodules, largest left upper lobe mass measuring 4.7 x 4.2 cm.  Mass in the inferior lingula measures 4.1 x 3.2 cm. -Bone scan on 05/02/2019 did not show any evidence of metastatic disease. -We reviewed initiation of palliative chemotherapy with single agent Adriamycin 75 mg per metered squared every 21 days.  We reviewed the side effects including mucositis, cardiac myopathy, cytopenias among others. -I have reviewed his labs including LFTs today.  He had port placed.  He will proceed with cycle 1 today.  We will plan to reimage him after 3 cycles.  If he has partial response, plan is to continue 6 cycles and then close surveillance. -I will make a referral to Dr. Angelina Ok at [facility].  2.  Dry cough: -He is taking hydrocodone syrup which is helping his cough at nighttime.  3.  Anxiety: -Continue Xanax 0.25 mg 2-3 times a day as needed.  4.  Left kidney lesion: -CT scan on  04/24/2019 showed mass arising in the periphery of the upper pole of the left kidney measuring 1.3 x 1.4 cm.  Mass is stable when compared to CT scan from [facility] from August 2020.  5.  High risk drug monitoring: -2D echo on 05/02/2019 shows EF 60-65%.  We will closely  monitor.     Orders placed this encounter:  Orders Placed This Encounter  Procedures  . CBC with Differential  . Comprehensive metabolic panel  . Magnesium      [name], MD [place] [phone]

## 2019-05-22 NOTE — Patient Instructions (Signed)
[facility] Discharge Instructions for Patients Receiving Chemotherapy  Today you received the following chemotherapy agents   To help prevent nausea and vomiting after your treatment, we encourage you to take your nausea medication   If you develop nausea and vomiting that is not controlled by your nausea medication, call the clinic.   BELOW ARE SYMPTOMS THAT SHOULD BE REPORTED IMMEDIATELY:  *FEVER GREATER THAN 100.5 F  *CHILLS WITH OR WITHOUT FEVER  NAUSEA AND VOMITING THAT IS NOT CONTROLLED WITH YOUR NAUSEA MEDICATION  *UNUSUAL SHORTNESS OF BREATH  *UNUSUAL BRUISING OR BLEEDING  TENDERNESS IN MOUTH AND THROAT WITH OR WITHOUT PRESENCE OF ULCERS  *URINARY PROBLEMS  *BOWEL PROBLEMS  UNUSUAL RASH Items with * indicate a potential emergency and should be followed up as soon as possible.  Feel free to call the clinic should you have any questions or concerns. The clinic phone number is [phone].  Please show the CHEMO ALERT CARD at check-in to the Emergency Department and triage nurse.   

## 2019-05-22 NOTE — Progress Notes (Signed)

## 2019-05-22 NOTE — Assessment & Plan Note (Signed)
1.  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 -Underwent chemoradiation therapy with gemcitabine and docetaxel followed by wide local excision. -Pathology on 11/10/2018 showed liposarcoma with 80% necrosis, grade 2, YPT4NX, positive deep margin, amplification of MDM 2 detected.  Amplification is associated with well-differentiated liposarcoma/atypical lipomatous tumors and dedifferentiated liposarcoma. -CT CAP in the ER on 04/24/2019 showed widespread lung nodules, largest left upper lobe mass measuring 4.7 x 4.2 cm.  Mass in the inferior lingula measures 4.1 x 3.2 cm. -Bone scan on 05/02/2019 did not show any evidence of metastatic disease. -We reviewed initiation of palliative chemotherapy with single agent Adriamycin 75 mg per metered squared every 21 days.  We reviewed the side effects including mucositis, cardiac myopathy, cytopenias among others. -I have reviewed his labs including LFTs today.  He had port placed.  He will proceed with cycle 1 today.  We will plan to reimage him after 3 cycles.  If he has partial response, plan is to continue 6 cycles and then close surveillance. -I will make a referral to Dr. Angelina Ok at [facility].  2.  Dry cough: -He is taking hydrocodone syrup which is helping his cough at nighttime.  3.  Anxiety: -Continue Xanax 0.25 mg 2-3 times a day as needed.  4.  Left kidney lesion: -CT scan on 04/24/2019 showed mass arising in the periphery of the upper pole of the left kidney measuring 1.3 x 1.4 cm.  Mass is stable when compared to CT scan from [facility] from August 2020.  5.  High risk drug monitoring: -2D echo on 05/02/2019 shows EF 60-65%.  We will closely monitor.

## 2019-05-22 NOTE — Progress Notes (Signed)
Patient has been assessed, vital signs and labs have been reviewed by Dr. Delton Coombes. ANC, Creatinine, LFTs, and Platelets are within treatment parameters, potassium is slightly low, please give 63mEq potassium PO per Dr. Delton Coombes. The patient is good to proceed with treatment at this time.

## 2019-05-23 ENCOUNTER — Inpatient Hospital Stay (HOSPITAL_COMMUNITY): Payer: 59 | Admitting: General Practice

## 2019-05-23 DIAGNOSIS — C499 Malignant neoplasm of connective and soft tissue, unspecified: Secondary | ICD-10-CM

## 2019-05-23 NOTE — Progress Notes (Signed)
[facility] Initial Psychosocial Assessment Clinical Social Work  Clinical Social Work contacted by phone to assess psychosocial, emotional, mental health, and spiritual needs of the patient.   Barriers to care/review of distress screen:  - Transportation:  Do you anticipate any problems getting to appointments?  Do you have someone who can help run errands for you if you need it?  Wife is driver - has help from in laws if needed.   - Help at home:  What is your living situation (alone, family, other)?  If you are physically unable to care for yourself, who would you call on to help you?  Lives w wife, she is physically able to assist.   - Support system:  What does your support system look like?  Who would you call on if you needed some kind of practical help?  What if you needed someone to talk to for emotional support?  In laws and children (ages 21 and 65).  All live nearby.  Mostly rely on family - have a "few people in the neighborhood who could help [place] out if we asked."  Feels "right now we are doing OK",  Have gone through difficulties especially when he was told his cancer was terminal and before he was in treatment.   - Finances:  Are you concerned about finances.  Considering returning to work?  If not, applying for disability?  Wife is not employed, patient has not worked since July 2020.  Is currently getting unemployment (getting ready to run out) and [employer] (through July), "getting the bills paid."  Patient worked in Architect company until diagnosis.  He was told that he does not qualify for disability because he has not worked enough.    What is your understanding of where you are with your cancer? Its cause?  Your treatment plan and what happens next? Worked in [employer], has always had problem w leg swelling.  Was used to regular issues w swollen legs - tried to see PCP because leg was hurting and he became unable to walk, were turned away due to lack of  insurance.  Went to ED/admitted and was sent to [facility] for chemo, radiation and then surgery.   Began treatment at [facility] last summer when he was uninsured, all care was paid for through charity care arrangement.  After surgery, [place] would not see him again until he was insured.  Were told they needed to purchase insurance on the exchanges this spring. Thought they were finished w treatment for cancer but he got a dry cough - chest Xray revealed that cancer had metastasized to lungs.  [employer] insurance they purchased is accepted at [facility], not [facility].  Will have chemotherapy at [facility] every 3 weeks for 3 cycles - then do a scan.  Had hoped to return to work, but recurrance of cancer has made that challenging.    What are your worries for the future as you begin treatment for cancer?  "His biggest concern is that he feels totally helpless, useless.  He has worked all his life and now he cant do anything."  Gets quiet, watches TV while sitting in recliner.  Needs frequent time to rest.  Any physical exertion at all is very hard on him.  Wife takes care of house and yard.    What are your hopes and priorities during your treatment? What is important to you? What are your goals for your care?  Most important thing to  him is spending time w his grandchildren - ages 64 and 13.  They know a bit about the nature of his illness, but don't know the full extent of the illness.    CSW Summary:  Patient and family psychosocial functioning including strengths, limitations, and coping skills:  59 yo married male, diagnosed w metastatic liposarcoma.  Has been unable to work since July 2020, has been on unemployment since that time.  Started treatment at [facility] - completed course of radiation, chemo and surgery while uninsured.  After surgery was told he needed to obtain insurance before continuing treatment at their center.  Was able to obtain coverage on exchanges; however, he had to switch treatment centers  due to coverage issues.  Newly starting chemotherapy at [facility].  Time between identification of lung metastasis and initiation of treatment was difficult for patient and wife - each kept their concerns to themselves.  They have started more open discussions, which are helpful.  Wife is concerned that patient may be depressed -has little energy or interest in doing things which used to interest him.  He is used to a physical job - now cannot do much without quickly becoming fatigued.  Currently they are not behind in bills, but unemployment benefits will run out soon.  Encouraged patient to consider applying for Social Security disability income - they have been told he has no employer sponsored disability coverage as he has not been employed for long enough.    Identifications of barriers to care:  Somewhat limited support, had to transition between cancer centers due to insurance difficulties, metastatic disease  Availability of community resources:  [place] and [place] resources - will email calendars for [place] and ask [facility] to reach out to wife/patient to offer their resources.    Clinical Social Worker follow up needed: Yes.    Will check in w patient/wife periodically.  Please consult if more urgent needs arise.    Edwyna Shell, LCSW Clinical Social Worker Phone:  [phone] Cell:  [phone]

## 2019-05-24 ENCOUNTER — Telehealth (HOSPITAL_COMMUNITY): Payer: Self-pay

## 2019-05-24 NOTE — Telephone Encounter (Signed)
Called to check on patient today. He is nauseated, not feeling well since yesterday. States he is sleeping but not feeling like he is getting any actual rest. He is eating, no vomiting. Reported small bowel movement yesterday, feels like he needs to go again today. He reported that he does not take anything to help his bowels move. Instructed him to take his compazine every 6 hours . I will call him later today or tomorrow to reacess his status.

## 2019-05-25 ENCOUNTER — Telehealth (HOSPITAL_COMMUNITY): Payer: Self-pay

## 2019-05-25 NOTE — Telephone Encounter (Signed)
Called patient again today to check on him. He did not feel well yesterday. Said he had been taking his nausea medication more often and it seemed to be helping. No other concerns or questions at this time.

## 2019-05-29 NOTE — Progress Notes (Signed)
Leonard [name], [place] [address]   CLINIC:  Medical Oncology/Hematology  PCP:  Leonard Squibb, MD [address] [name] [address] [phone]   REASON FOR VISIT:  Follow-up for metastatic dedifferentiated lipsarcoma  PRIOR THERAPY:  Chemoradiation therapy with gemcitabine and docetaxel followed by wide local excision on 11/10/2018.  NGS Results: Not done.  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 male returns for routine follow-up and consideration for next  cycle of chemotherapy. Leonard [name] was last seen on 05/22/2019. Today is day 8 cycle 1 of Adriamycin.   Overall, he tells me he has been feeling pretty well. He notes that he has been fairly fatigued since his last treatment. He could sleep all night and wake up still fatigued. He had a few days of nausea, which was resolved with medication. He has 2-3 days of diarrhea made up of extremely soft stool, 3 times each day. His diarrhea has resolved.     Overall, he feels ready for next cycle of chemo today.    REVIEW OF SYSTEMS:  Review of Systems  Constitutional: Positive for fatigue. Negative for appetite change, chills and fever.  HENT:   Negative for lump/mass, mouth sores, sore throat and trouble swallowing.   Eyes: Negative for eye problems.  Respiratory: Negative for chest tightness, cough, shortness of breath and wheezing.   Cardiovascular: Positive for chest pain (heartburn). Negative for palpitations.  Gastrointestinal: Positive for diarrhea (2-3 days, 3x) and nausea. Negative for abdominal pain, constipation and vomiting.  Genitourinary: Negative for bladder incontinence, dysuria, frequency and hematuria.   Musculoskeletal: Negative for arthralgias, back pain, flank pain and myalgias.  Skin: Negative for rash.  Neurological: Negative for dizziness, headaches, light-headedness and numbness.  Hematological: Does not bruise/bleed easily.  Psychiatric/Behavioral: Negative for depression. The patient is not nervous/anxious.     PAST MEDICAL/SURGICAL HISTORY:  Past Medical History:  Diagnosis Date  . Cancer ([place])   . Port-A-Cath in place 05/19/2019   Past Surgical History:  Procedure Laterality Date  . CHOLECYSTECTOMY    . ESOPHAGEAL DILATION N/A 06/27/2017   Procedure: ESOPHAGEAL DILATION;  Surgeon: Leonard Binder, MD;  [facility];  Service: Endoscopy;  Laterality: N/A;  . ESOPHAGOGASTRODUODENOSCOPY N/A 06/27/2017   Procedure: ESOPHAGOGASTRODUODENOSCOPY (EGD);  Surgeon:  Leonard Binder, MD;  Location: [facility];  Service: Endoscopy;  Laterality: N/A;  . IR IMAGING GUIDED PORT INSERTION  05/04/2019  . knee sx      SOCIAL HISTORY:  Social History   Socioeconomic History  . Marital status: Married    Spouse name: Not on file  . Number of children: 1  . Years of education: Not on file  . Highest education level: Not on file  Occupational History  . Occupation: Unemployed  Tobacco Use  . Smoking status: Former Research scientist (life sciences)  . Smokeless tobacco: Never Used  Substance and Sexual Activity  . Alcohol use: Yes    Comment: 1-2 drinks per month  . Drug use: Never  . Sexual activity: Not Currently  Other Topics Concern  . Not on file  Social History Narrative   DOES CONSTRUCTION. MARRIED. RARE ETOH. NO TOBACCO.   Social Determinants of Health   Financial Resource Strain: Medium Risk  . Difficulty of Paying Living Expenses: Somewhat hard  Food Insecurity: No Food Insecurity  . Worried About Charity fundraiser in the Last Year: Never true  . Ran Out of Food in the Last Year: Never true  Transportation Needs: No Transportation Needs  . Lack of Transportation (Medical): No  . Lack of Transportation (Non-Medical): No  Physical Activity: Inactive  . Days of Exercise per Week: 0 days  . Minutes of Exercise per Session: 0 min  Stress: Stress Concern Present  . Feeling of Stress : To some extent  Social Connections: Slightly Isolated  . Frequency of Communication with Friends and Family: More than three times a week  . Frequency of Social Gatherings with Friends and Family: Once a week  . Attends Religious Services: More than 4 times per year  . Active Member of Clubs or Organizations: No  . Attends Archivist Meetings: Never  . Marital Status: Married  Human resources officer Violence: Not At Risk  . Fear of Current or Ex-Partner: No  . Emotionally Abused: No  . Physically Abused: No  . Sexually Abused: No    FAMILY HISTORY:  Family History    Problem Relation Age of Onset  . Heart disease Father   . Cancer Maternal Aunt   . Cancer Maternal Uncle   . Cancer Paternal Uncle   . Vision loss Maternal Grandfather   . Colon cancer Neg Hx   . Colon polyps Neg Hx     CURRENT MEDICATIONS:  Current Outpatient Medications  Medication Sig Dispense Refill  . DOXORUBICIN HCL IV Inject 75 mg/m2 into the vein every 21 ( twenty-one) days.    . Pseudoeph-Doxylamine-DM-APAP (DAYQUIL/NYQUIL COLD/FLU RELIEF PO) Take 30 mLs by mouth in the morning and at bedtime. Takes 7ml of dayquil each morning and 36ml of nyquil at bedtime.    . ALPRAZolam (XANAX) 0.25 MG tablet Take 1 tablet (0.25 mg total) by mouth 2 (two) times daily as needed for anxiety. (Patient not taking: Reported on 05/22/2019) 30 tablet 0  . benzonatate (TESSALON) 100 MG capsule Take 2 capsules (200 mg total) by mouth 3 (three) times daily as needed. (Patient not taking: Reported on 05/15/2019) 30 capsule 0  . diphenhydrAMINE (BENADRYL) 12.5 MG/5ML liquid Take 6.25 mg by mouth as needed for itching.    Marland Kitchen HYDROcodone-homatropine (HYCODAN) 5-1.5 MG/5ML syrup SMARTSIG:1 Teaspoon By Mouth Every 6 Hours PRN (Patient not  taking: Reported on 05/22/2019) 480 mL 0  . lidocaine-prilocaine (EMLA) cream Apply a small amount to port a cath site and cover with plastic wrap 1 hour prior to chemotherapy appointments (Patient not taking: Reported on 05/30/2019) 30 g 3  . prochlorperazine (COMPAZINE) 10 MG tablet Take 1 tablet (10 mg total) by mouth every 6 (six) hours as needed (Nausea or vomiting). (Patient not taking: Reported on 05/22/2019) 30 tablet 1  . triamcinolone cream (KENALOG) 0.1 % APPLY TO AFFECTED AREACTHREE (3) TIMES DAILY AS NEEDED FOR RASH.     No current facility-administered medications for this visit.    ALLERGIES:  No Known Allergies  PHYSICAL EXAM:  Performance status (ECOG): 1 - Symptomatic but completely ambulatory  Vitals:   05/30/19 1404  BP: 123/83  Pulse: 81  Resp: 18   Temp: 97.8 F (36.6 C)  SpO2: 93%   Wt Readings from Last 3 Encounters:  05/30/19 243 lb 3.2 oz (110.3 kg)  05/22/19 243 lb 11.2 oz (110.5 kg)  05/15/19 244 lb 14.4 oz (111.1 kg)   Physical Exam Constitutional:      Appearance: Normal appearance.  HENT:     Nose: No congestion.     Mouth/Throat:     Mouth: Mucous membranes are moist.  Eyes:     Extraocular Movements: Extraocular movements intact.     Pupils: Pupils are equal, round, and reactive to light.  Cardiovascular:     Rate and Rhythm: Normal rate and regular rhythm.     Heart sounds: No murmur. No gallop.   Pulmonary:     Breath sounds: No wheezing, rhonchi or rales.  Abdominal:     Tenderness: There is no abdominal tenderness.  Musculoskeletal:        General: No tenderness.     Cervical back: Normal range of motion. No tenderness.     Right lower leg: No edema.     Left lower leg: No edema.  Skin:    General: Skin is warm and dry.     Findings: No bruising, erythema or rash.  Neurological:     Mental Status: He is alert and oriented to person, place, and time.     Sensory: No sensory deficit.     Motor: No weakness.  Psychiatric:        Mood and Affect: Mood normal.        Behavior: Behavior normal.        Thought Content: Thought content normal.        Judgment: Judgment normal.     LABORATORY DATA:  I have reviewed the labs as listed.  CBC Latest Ref Rng & Units 05/30/2019 05/22/2019 05/08/2019  WBC 4.0 - 10.5 K/uL 3.6(L) 6.9 5.8  Hemoglobin 13.0 - 17.0 g/dL 12.6(L) 13.5 14.3  Hematocrit 39.0 - 52.0 % 38.0(L) 41.6 42.9  Platelets 150 - 400 K/uL 173 253 332   CMP Latest Ref Rng & Units 05/30/2019 05/22/2019 04/24/2019  Glucose 70 - 99 mg/dL 133(H) 162(H) 135(H)  BUN 6 - 20 mg/dL 14 13 15   Creatinine 0.61 - 1.24 mg/dL 0.71 0.81 0.99  Sodium 135 - 145 mmol/L 135 137 137  Potassium 3.5 - 5.1 mmol/L 4.2 3.4(L) 3.9  Chloride 98 - 111 mmol/L 98 102 102  CO2 22 - 32 mmol/L 26 26 24   Calcium 8.9 - 10.3  mg/dL 9.3 8.8(L) 9.1  Total Protein 6.5 - 8.1 g/dL 7.1 7.1 7.7  Total Bilirubin 0.3 - 1.2 mg/dL 0.6 0.8 0.8  Alkaline Phos  38 - 126 U/L 107 109 125  AST 15 - 41 U/L 20 29 18   ALT 0 - 44 U/L 23 29 25     DIAGNOSTIC IMAGING:  I have independently reviewed the scans and discussed with the patient.  ASSESSMENT & PLAN:  Liposarcoma ([place]) 1.  Metastatic dedifferentiated liposarcoma: -CT chest showed multiple lung masses, largest in the posterior part of the left upper lobe.  Adenopathy in the mediastinum and hilum.  Left upper pole hypodense lesion concerning for renal cell carcinoma. -Bone scan on 05/02/2019 did not show any evidence of metastatic disease. -Cycle 1 of Adriamycin 75 mg per metered square on 05/22/2019. -He felt weak for the first few days.  He also felt queasy which improved.  Reported some heartburn which also improved with Pepto-Bismol. -We reviewed his labs which showed slight decrease in white count.  Otherwise normal LFTs. -No mucositis or hand-foot skin reaction on examination. -We will reevaluate him in 2 weeks for follow-up and next cycle of chemotherapy.  We will plan to repeat scans after 3 cycles.  If he gets partial response, plan is to continue for 6 cycles and then close surveillance.  2.  Dry cough: -Continue hydrocodone syrup which is helping with cough at nighttime.  3.  Anxiety: -Continue Xanax 0.25 mg 2-3 times a day as needed.  4.  Left kidney lesion: -CT scan on 04/24/2019 showed mass arising in the periphery of the upper pole of the left kidney measuring 1.3 x 1.4 cm.  This is stable compared to CT scan from August 2020.  5.  High risk drug monitoring: -2D echo on 05/02/2019 with EF 60 to 65%.  We will closely monitor periodic echocardiograms.    Orders placed this encounter:  Orders Placed This Encounter  Procedures  . CBC with Differential/Platelet  . Comprehensive metabolic panel  . Magnesium  . Lactate dehydrogenase      Leonard Jack, MD, 05/30/19 3:12 PM  [place] [phone]   I, Jacqualyn Posey, am acting as a scribe for Dr. Sanda Linger.  I, Leonard Jack MD, have reviewed the above documentation for accuracy and completeness, and I agree with the above.

## 2019-05-30 ENCOUNTER — Other Ambulatory Visit: Payer: Self-pay

## 2019-05-30 ENCOUNTER — Other Ambulatory Visit (HOSPITAL_COMMUNITY): Payer: Self-pay | Admitting: *Deleted

## 2019-05-30 ENCOUNTER — Inpatient Hospital Stay (HOSPITAL_COMMUNITY): Payer: 59

## 2019-05-30 ENCOUNTER — Inpatient Hospital Stay (HOSPITAL_COMMUNITY): Payer: 59 | Admitting: Hematology

## 2019-05-30 VITALS — BP 123/83 | HR 81 | Temp 97.8°F | Resp 18 | Wt 243.2 lb

## 2019-05-30 DIAGNOSIS — C4922 Malignant neoplasm of connective and soft tissue of left lower limb, including hip: Secondary | ICD-10-CM | POA: Diagnosis not present

## 2019-05-30 DIAGNOSIS — C499 Malignant neoplasm of connective and soft tissue, unspecified: Secondary | ICD-10-CM | POA: Diagnosis not present

## 2019-05-30 DIAGNOSIS — Z5111 Encounter for antineoplastic chemotherapy: Secondary | ICD-10-CM | POA: Diagnosis not present

## 2019-05-30 LAB — COMPREHENSIVE METABOLIC PANEL
ALT: 23 U/L (ref 0–44)
AST: 20 U/L (ref 15–41)
Albumin: 3.6 g/dL (ref 3.5–5.0)
Alkaline Phosphatase: 107 U/L (ref 38–126)
Anion gap: 11 (ref 5–15)
BUN: 14 mg/dL (ref 6–20)
CO2: 26 mmol/L (ref 22–32)
Calcium: 9.3 mg/dL (ref 8.9–10.3)
Chloride: 98 mmol/L (ref 98–111)
Creatinine, Ser: 0.71 mg/dL (ref 0.61–1.24)
GFR calc Af Amer: >60 mL/min (ref >60)
GFR calc non Af Amer: >60 mL/min (ref >60)
Glucose, Bld: 133 mg/dL — ABNORMAL HIGH (ref 70–99)
Potassium: 4.2 mmol/L (ref 3.5–5.1)
Sodium: 135 mmol/L (ref 135–145)
Total Bilirubin: 0.6 mg/dL (ref 0.3–1.2)
Total Protein: 7.1 g/dL (ref 6.5–8.1)

## 2019-05-30 LAB — CBC WITH DIFFERENTIAL/PLATELET
Abs Immature Granulocytes: 0.02 10*3/uL (ref 0.00–0.07)
Basophils Absolute: 0 10*3/uL (ref 0.0–0.1)
Basophils Relative: 1 %
Eosinophils Absolute: 0.2 10*3/uL (ref 0.0–0.5)
Eosinophils Relative: 5 %
HCT: 38 % — ABNORMAL LOW (ref 39.0–52.0)
Hemoglobin: 12.6 g/dL — ABNORMAL LOW (ref 13.0–17.0)
Immature Granulocytes: 1 %
Lymphocytes Relative: 13 %
Lymphs Abs: 0.5 10*3/uL — ABNORMAL LOW (ref 0.7–4.0)
MCH: 30.3 pg (ref 26.0–34.0)
MCHC: 33.2 g/dL (ref 30.0–36.0)
MCV: 91.3 fL (ref 80.0–100.0)
Monocytes Absolute: 0.1 10*3/uL (ref 0.1–1.0)
Monocytes Relative: 1 %
Neutro Abs: 2.8 10*3/uL (ref 1.7–7.7)
Neutrophils Relative %: 79 %
Platelets: 173 10*3/uL (ref 150–400)
RBC: 4.16 MIL/uL — ABNORMAL LOW (ref 4.22–5.81)
RDW: 12.6 % (ref 11.5–15.5)
WBC: 3.6 10*3/uL — ABNORMAL LOW (ref 4.0–10.5)
nRBC: 0 % (ref 0.0–0.2)

## 2019-05-30 LAB — MAGNESIUM: Magnesium: 2.1 mg/dL (ref 1.7–2.4)

## 2019-05-30 MED ORDER — ALPRAZOLAM 0.25 MG PO TABS: 0.2500 mg | ORAL_TABLET | Freq: Two times a day (BID) | ORAL | 0 refills | Status: DC | PRN

## 2019-05-30 MED ORDER — HYDROCODONE-HOMATROPINE 5-1.5 MG/5ML PO SYRP: ORAL_SOLUTION | ORAL | 0 refills | Status: DC

## 2019-05-30 NOTE — Patient Instructions (Signed)
[place] at [facility] Discharge Instructions  You were seen today by Dr. Delton Coombes. He went over your recent results. He will see you back in for labs and follow up.   Thank you for choosing [place] at [facility] to provide your oncology and hematology care.  To afford each patient quality time with our provider, please arrive at least 15 minutes before your scheduled appointment time.   If you have a lab appointment with the [facility] please come in thru the  Main Entrance and check in at the main information desk  You need to re-schedule your appointment should you arrive 10 or more minutes late.  We strive to give you quality time with our providers, and arriving late affects you and other patients whose appointments are after yours.  Also, if you no show three or more times for appointments you may be dismissed from the clinic at the providers discretion.     Again, thank you for choosing [facility].  Our hope is that these requests will decrease the amount of time that you wait before being seen by our physicians.       _____________________________________________________________  Should you have questions after your visit to [facility], please contact our office at [phone] between the hours of 8:00 a.m. and 4:30 p.m.  Voicemails left after 4:00 p.m. will not be returned until the following business day.  For prescription refill requests, have your pharmacy contact our office and allow 72 hours.    Cancer Center Support Programs:   > Cancer Support Group  2nd Tuesday of the month 1pm-2pm, Journey Room

## 2019-05-30 NOTE — Assessment & Plan Note (Signed)
1.  Metastatic dedifferentiated liposarcoma: -CT chest showed multiple lung masses, largest in the posterior part of the left upper lobe.  Adenopathy in the mediastinum and hilum.  Left upper pole hypodense lesion concerning for renal cell carcinoma. -Bone scan on 05/02/2019 did not show any evidence of metastatic disease. -Cycle 1 of Adriamycin 75 mg per metered square on 05/22/2019. -He felt weak for the first few days.  He also felt queasy which improved.  Reported some heartburn which also improved with Pepto-Bismol. -We reviewed his labs which showed slight decrease in white count.  Otherwise normal LFTs. -No mucositis or hand-foot skin reaction on examination. -We will reevaluate him in 2 weeks for follow-up and next cycle of chemotherapy.  We will plan to repeat scans after 3 cycles.  If he gets partial response, plan is to continue for 6 cycles and then close surveillance.  2.  Dry cough: -Continue hydrocodone syrup which is helping with cough at nighttime.  3.  Anxiety: -Continue Xanax 0.25 mg 2-3 times a day as needed.  4.  Left kidney lesion: -CT scan on 04/24/2019 showed mass arising in the periphery of the upper pole of the left kidney measuring 1.3 x 1.4 cm.  This is stable compared to CT scan from August 2020.  5.  High risk drug monitoring: -2D echo on 05/02/2019 with EF 60 to 65%.  We will closely monitor periodic echocardiograms.

## 2019-06-13 ENCOUNTER — Inpatient Hospital Stay (HOSPITAL_COMMUNITY): Payer: 59

## 2019-06-13 ENCOUNTER — Other Ambulatory Visit: Payer: Self-pay

## 2019-06-13 ENCOUNTER — Inpatient Hospital Stay (HOSPITAL_COMMUNITY): Payer: 59 | Attending: Hematology | Admitting: Hematology

## 2019-06-13 ENCOUNTER — Encounter (HOSPITAL_COMMUNITY): Payer: Self-pay | Admitting: Hematology

## 2019-06-13 VITALS — BP 121/78 | HR 72 | Temp 97.3°F | Resp 18

## 2019-06-13 DIAGNOSIS — F419 Anxiety disorder, unspecified: Secondary | ICD-10-CM | POA: Insufficient documentation

## 2019-06-13 DIAGNOSIS — Z87891 Personal history of nicotine dependence: Secondary | ICD-10-CM | POA: Insufficient documentation

## 2019-06-13 DIAGNOSIS — R5383 Other fatigue: Secondary | ICD-10-CM | POA: Diagnosis not present

## 2019-06-13 DIAGNOSIS — R12 Heartburn: Secondary | ICD-10-CM | POA: Diagnosis not present

## 2019-06-13 DIAGNOSIS — R05 Cough: Secondary | ICD-10-CM | POA: Diagnosis not present

## 2019-06-13 DIAGNOSIS — N289 Disorder of kidney and ureter, unspecified: Secondary | ICD-10-CM | POA: Diagnosis not present

## 2019-06-13 DIAGNOSIS — R0609 Other forms of dyspnea: Secondary | ICD-10-CM | POA: Diagnosis not present

## 2019-06-13 DIAGNOSIS — R918 Other nonspecific abnormal finding of lung field: Secondary | ICD-10-CM | POA: Diagnosis not present

## 2019-06-13 DIAGNOSIS — Z79899 Other long term (current) drug therapy: Secondary | ICD-10-CM | POA: Insufficient documentation

## 2019-06-13 DIAGNOSIS — C4922 Malignant neoplasm of connective and soft tissue of left lower limb, including hip: Secondary | ICD-10-CM | POA: Insufficient documentation

## 2019-06-13 DIAGNOSIS — R600 Localized edema: Secondary | ICD-10-CM | POA: Diagnosis not present

## 2019-06-13 DIAGNOSIS — Z95828 Presence of other vascular implants and grafts: Secondary | ICD-10-CM

## 2019-06-13 DIAGNOSIS — Z5111 Encounter for antineoplastic chemotherapy: Secondary | ICD-10-CM | POA: Diagnosis not present

## 2019-06-13 LAB — CBC WITH DIFFERENTIAL/PLATELET
Abs Immature Granulocytes: 0.28 10*3/uL — ABNORMAL HIGH (ref 0.00–0.07)
Basophils Absolute: 0.1 10*3/uL (ref 0.0–0.1)
Basophils Relative: 2 %
Eosinophils Absolute: 0 10*3/uL (ref 0.0–0.5)
Eosinophils Relative: 0 %
HCT: 39.1 % (ref 39.0–52.0)
Hemoglobin: 12.8 g/dL — ABNORMAL LOW (ref 13.0–17.0)
Immature Granulocytes: 4 %
Lymphocytes Relative: 16 %
Lymphs Abs: 1.1 10*3/uL (ref 0.7–4.0)
MCH: 30.1 pg (ref 26.0–34.0)
MCHC: 32.7 g/dL (ref 30.0–36.0)
MCV: 92 fL (ref 80.0–100.0)
Monocytes Absolute: 1.1 10*3/uL — ABNORMAL HIGH (ref 0.1–1.0)
Monocytes Relative: 15 %
Neutro Abs: 4.6 10*3/uL (ref 1.7–7.7)
Neutrophils Relative %: 63 %
Platelets: 639 10*3/uL — ABNORMAL HIGH (ref 150–400)
RBC: 4.25 MIL/uL (ref 4.22–5.81)
RDW: 14.5 % (ref 11.5–15.5)
WBC: 7.2 10*3/uL (ref 4.0–10.5)
nRBC: 0 % (ref 0.0–0.2)

## 2019-06-13 LAB — MAGNESIUM: Magnesium: 2.2 mg/dL (ref 1.7–2.4)

## 2019-06-13 LAB — COMPREHENSIVE METABOLIC PANEL
ALT: 39 U/L (ref 0–44)
AST: 32 U/L (ref 15–41)
Albumin: 3.8 g/dL (ref 3.5–5.0)
Alkaline Phosphatase: 123 U/L (ref 38–126)
Anion gap: 9 (ref 5–15)
BUN: 9 mg/dL (ref 6–20)
CO2: 25 mmol/L (ref 22–32)
Calcium: 9.3 mg/dL (ref 8.9–10.3)
Chloride: 102 mmol/L (ref 98–111)
Creatinine, Ser: 0.74 mg/dL (ref 0.61–1.24)
GFR calc Af Amer: >60 mL/min (ref >60)
GFR calc non Af Amer: >60 mL/min (ref >60)
Glucose, Bld: 106 mg/dL — ABNORMAL HIGH (ref 70–99)
Potassium: 4.1 mmol/L (ref 3.5–5.1)
Sodium: 136 mmol/L (ref 135–145)
Total Bilirubin: 0.5 mg/dL (ref 0.3–1.2)
Total Protein: 7.6 g/dL (ref 6.5–8.1)

## 2019-06-13 LAB — LACTATE DEHYDROGENASE: LDH: 181 U/L (ref 98–192)

## 2019-06-13 MED ORDER — SODIUM CHLORIDE 0.9% FLUSH: 10.0000 mL | INTRAVENOUS | Status: DC | PRN

## 2019-06-13 MED ORDER — SODIUM CHLORIDE 0.9% FLUSH
10.0000 mL | INTRAVENOUS | Status: DC | PRN
  Administered 2019-06-13: 10 mL

## 2019-06-13 MED ORDER — SODIUM CHLORIDE 0.9 % IV SOLN
10.0000 mg | Freq: Once | INTRAVENOUS | Status: AC
  Administered 2019-06-13: 10 mg via INTRAVENOUS
  Filled 2019-06-13: qty 10

## 2019-06-13 MED ORDER — PALONOSETRON HCL INJECTION 0.25 MG/5ML
0.2500 mg | Freq: Once | INTRAVENOUS | Status: AC
  Administered 2019-06-13: 0.25 mg via INTRAVENOUS
  Filled 2019-06-13: qty 5

## 2019-06-13 MED ORDER — SODIUM CHLORIDE 0.9 % IV SOLN: Freq: Once | INTRAVENOUS | Status: AC

## 2019-06-13 MED ORDER — DOXORUBICIN HCL CHEMO IV INJECTION 2 MG/ML
75.0000 mg/m2 | Freq: Once | INTRAVENOUS | Status: AC
  Administered 2019-06-13: 174 mg via INTRAVENOUS
  Filled 2019-06-13: qty 87

## 2019-06-13 MED ORDER — SODIUM CHLORIDE 0.9 % IV SOLN
150.0000 mg | Freq: Once | INTRAVENOUS | Status: AC
  Administered 2019-06-13: 150 mg via INTRAVENOUS
  Filled 2019-06-13: qty 150

## 2019-06-13 MED ORDER — HEPARIN SOD (PORK) LOCK FLUSH 100 UNIT/ML IV SOLN
500.0000 [IU] | Freq: Once | INTRAVENOUS | Status: AC | PRN
  Administered 2019-06-13: 500 [IU]

## 2019-06-13 NOTE — Patient Instructions (Signed)
[facility] Discharge Instructions for Patients Receiving Chemotherapy   Beginning January 23rd 2017 lab work for the [facility] will be done in the  Main lab at [facility] on 1st floor. If you have a lab appointment with the [facility] please come in thru the  Main Entrance and check in at the main information desk   Today you received the following chemotherapy agents Adriamycin. Follow-up as scheduled  To help prevent nausea and vomiting after your treatment, we encourage you to take your nausea medication   If you develop nausea and vomiting, or diarrhea that is not controlled by your medication, call the clinic.  The clinic phone number is [phone]. Office hours are Monday-Friday 8:30am-5:00pm.  BELOW ARE SYMPTOMS THAT SHOULD BE REPORTED IMMEDIATELY:  *FEVER GREATER THAN 101.0 F  *CHILLS WITH OR WITHOUT FEVER  NAUSEA AND VOMITING THAT IS NOT CONTROLLED WITH YOUR NAUSEA MEDICATION  *UNUSUAL SHORTNESS OF BREATH  *UNUSUAL BRUISING OR BLEEDING  TENDERNESS IN MOUTH AND THROAT WITH OR WITHOUT PRESENCE OF ULCERS  *URINARY PROBLEMS  *BOWEL PROBLEMS  UNUSUAL RASH Items with * indicate a potential emergency and should be followed up as soon as possible. If you have an emergency after office hours please contact your primary care physician or go to the nearest emergency department.  Please call the clinic during office hours if you have any questions or concerns.   You may also contact the Patient Navigator at [phone] should you have any questions or need assistance in obtaining follow up care.      Resources For Cancer Patients and their Caregivers ? American Cancer Society: Can assist with transportation, wigs, general needs, runs Look Good Feel Better.        (470)093-5860 ? Cancer Care: Provides financial assistance, online support groups, medication/co-pay assistance.  1-800-813-HOPE 3864552170) ? [facility] Assists [place] cancer patients and their families through emotional , educational and financial support.  (380)547-0006 ? [place] DSS Where to apply for food stamps, Medicaid and utility assistance. 682 784 0500 ? [facility]: Transportation to medical appointments. 234-596-8673 ? Social Security Administration: May apply for disability if have a Stage IV cancer. 715-240-9729 256-806-1970 ? [employer], Disability and Transit Services: Assists with nutrition, care and transit needs. 669-874-2287

## 2019-06-13 NOTE — Progress Notes (Signed)
Patient has been assessed, vital signs and labs have been reviewed by Dr. Katragadda. ANC, Creatinine, LFTs, and Platelets are within treatment parameters per Dr. Katragadda. The patient is good to proceed with treatment at this time.  

## 2019-06-13 NOTE — Progress Notes (Signed)
1341 Labs reviewed with and pt seen by Dr. Delton Coombes and pt approved for Adraimycin infusion today per MD                              [facility] [place] tolerated Adriamycin well without complaints or incident. VSS upon discharge. Pt discharged self ambulatory in satisfactory condition accompanied by his wife

## 2019-06-13 NOTE — Patient Instructions (Signed)
[place] at [facility] Discharge Instructions  You were seen today by Dr. Delton Coombes. He went over your recent results. Dr. Delton Coombes will see you back in 3 weeks for labs and follow up.   Thank you for choosing [place] at [facility] to provide your oncology and hematology care.  To afford each patient quality time with our provider, please arrive at least 15 minutes before your scheduled appointment time.   If you have a lab appointment with the [facility] [facility] please come in thru the  Main Entrance and check in at the main information desk  You need to re-schedule your appointment should you arrive 10 or more minutes late.  We strive to give you quality time with our providers, and arriving late affects you and other patients whose appointments are after yours.  Also, if you no show three or more times for appointments you may be dismissed from the clinic at the providers discretion.     Again, thank you for choosing [facility].  Our hope is that these requests will decrease the amount of time that you wait before being seen by our physicians.       _____________________________________________________________  Should you have questions after your visit to [facility], please contact our office at [phone] between the hours of 8:00 a.m. and 4:30 p.m.  Voicemails left after 4:00 p.m. will not be returned until the following business day.  For prescription refill requests, have your pharmacy contact our office and allow 72 hours.    Cancer Center Support Programs:   > Cancer Support Group  2nd Tuesday of the month 1pm-2pm, Journey Room

## 2019-06-13 NOTE — Progress Notes (Signed)
[name], [name] [address]   CLINIC:  Medical Oncology/Hematology  PCP:  Leonard Squibb, MD [address] [name] [address] [phone]   REASON FOR VISIT:  Follow-up for metastatic dedifferentiated lipsarcoma  PRIOR THERAPY: Chemoradiation therapy with gemcitabine and docetaxel followed by wide local excision on 11/10/2018.  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    Liposarcoma ([name])  05/15/2019 Initial Diagnosis   Liposarcoma ([name])   05/15/2019 Cancer Staging   Staging form: Primary Cutaneous B-Cell/T-Cell Lymphoma (Non-MF/SS Lymphoma), AJCC 8th Edition - Clinical stage from 05/15/2019: [name], pM1 - Signed by Leonard Jack, MD on 05/15/2019     CANCER STAGING: Cancer Staging Liposarcoma [facility]) Staging form: Primary Cutaneous B-Cell/T-Cell Lymphoma (Non-MF/SS Lymphoma), AJCC 8th Edition - Clinical stage from 05/15/2019: [name], pM1 - Signed by Leonard Jack, MD on 05/15/2019  Liposarcoma of left lower extremity ([name]) Staging form: Primary Cutaneous B-Cell/T-Cell Lymphoma (Non-MF/SS Lymphoma), AJCC 8th Edition - Clinical: No stage assigned - Unsigned   INTERVAL HISTORY:  [name], a 59 y.o. male, returns for routine follow-up and consideration  for next cycle of chemotherapy. Leonard [name] was last seen on 05/30/2019.  Due for cycle #2 of Adriamycin every 3 weeks today.   Overall, he tells me he has been having decreased levels of energy. He coughs up blood occasionally; he continues having coughing spells.  He is taking hydrocodone syrup which is helping.  He noticed a small tinge of blood on the sputum on and off.  Overall, he feels ready for next cycle of chemo today.    REVIEW OF SYSTEMS:  Review of Systems  Constitutional: Positive for appetite change (Mildly decreased) and fatigue (Severe).  Respiratory: Positive for cough, hemoptysis and shortness of breath.   Cardiovascular: Positive for chest pain (heartburn) and leg swelling.  Gastrointestinal: Positive for constipation.  Neurological: Positive for dizziness.  Psychiatric/Behavioral: Positive for depression.  All other systems reviewed and are negative.   PAST MEDICAL/SURGICAL HISTORY:  Past Medical History:  Diagnosis Date   Cancer ([name])    Port-A-Cath in place 05/19/2019   Past Surgical History:  Procedure Laterality Date   CHOLECYSTECTOMY     ESOPHAGEAL DILATION N/A 06/27/2017   Procedure: ESOPHAGEAL DILATION;  Surgeon: Danie Binder, MD;  Location[facility];  Service: Endoscopy;  Laterality: N/A;   ESOPHAGOGASTRODUODENOSCOPY N/A 06/27/2017   Procedure: ESOPHAGOGASTRODUODENOSCOPY (EGD);  Surgeon: Danie Binder, MD;  Location: [facility];  Service: Endoscopy;  Laterality: N/A;   IR IMAGING GUIDED PORT INSERTION  05/04/2019   knee sx      SOCIAL HISTORY:  Social History   Socioeconomic History   Marital status: Married    Spouse name: Not on file   Number of children: 1   Years of education: Not on file  Highest education level: Not on file  Occupational History   Occupation: Unemployed  Tobacco Use   Smoking status: Former Smoker   Smokeless tobacco: Never Used  Substance and Sexual Activity   Alcohol use: Yes    Comment: 1-2  drinks per month   Drug use: Never   Sexual activity: Not Currently  Other Topics Concern   Not on file  Social History Narrative   DOES CONSTRUCTION. MARRIED. RARE ETOH. NO TOBACCO.   Social Determinants of Health   Financial Resource Strain: Medium Risk   Difficulty of Paying Living Expenses: Somewhat hard  Food Insecurity: No Food Insecurity   Worried About Charity fundraiser in the Last Year: Never true   Ran Out of Food in the Last Year: Never true  Transportation Needs: No Transportation Needs   Lack of Transportation (Medical): No   Lack of Transportation (Non-Medical): No  Physical Activity: Inactive   Days of Exercise per Week: 0 days   Minutes of Exercise per Session: 0 min  Stress: Stress Concern Present   Feeling of Stress : To some extent  Social Connections: Slightly Isolated   Frequency of Communication with Friends and Family: More than three times a week   Frequency of Social Gatherings with Friends and Family: Once a week   Attends Religious Services: More than 4 times per year   Active Member of [employer] or Organizations: No   Attends Music therapist: Never   Marital Status: Married  Human resources officer Violence: Not At Risk   Fear of Current or Ex-Partner: No   Emotionally Abused: No   Physically Abused: No   Sexually Abused: No    FAMILY HISTORY:  Family History  Problem Relation Age of Onset   Heart disease Father    Cancer Maternal Aunt    Cancer Maternal Uncle    Cancer Paternal Uncle    Vision loss Maternal Grandfather    Colon cancer Neg Hx    Colon polyps Neg Hx     CURRENT MEDICATIONS:  Current Outpatient Medications  Medication Sig Dispense Refill   ALPRAZolam (XANAX) 0.25 MG tablet Take 1 tablet (0.25 mg total) by mouth 2 (two) times daily as needed for anxiety. 30 tablet 0   DOXORUBICIN HCL IV Inject 75 mg/m2 into the vein every 21 ( twenty-one) days.     HYDROcodone-homatropine (HYCODAN)  5-1.5 MG/5ML syrup SMARTSIG:1 Teaspoon By Mouth Every 6 Hours PRN 480 mL 0   lidocaine-prilocaine (EMLA) cream Apply a small amount to port a cath site and cover with plastic wrap 1 hour prior to chemotherapy appointments 30 g 3   prochlorperazine (COMPAZINE) 10 MG tablet Take 1 tablet (10 mg total) by mouth every 6 (six) hours as needed (Nausea or vomiting). 30 tablet 1   triamcinolone cream (KENALOG) 0.1 % APPLY TO AFFECTED AREACTHREE (3) TIMES DAILY AS NEEDED FOR RASH.     No current facility-administered medications for this visit.   Facility-Administered Medications Ordered in Other Visits  Medication Dose Route Frequency Provider Last Rate Last Admin   sodium chloride flush (NS) 0.9 % injection 10 mL  10 mL Intravenous PRN Leonard Jack, MD        ALLERGIES:  No Known Allergies  PHYSICAL EXAM:  Performance status (ECOG): 1 - Symptomatic but completely ambulatory  There were no vitals filed for this visit. Wt Readings from Last 3 Encounters:  05/30/19 243 lb 3.2 oz (110.3 kg)  05/22/19 243 lb 11.2 oz (110.5 kg)  05/15/19 244 lb 14.4 oz (111.1 kg)   Physical Exam Vitals reviewed.  Constitutional:      Appearance: Normal appearance.  Cardiovascular:     Rate and Rhythm: Normal rate and regular rhythm.     Heart sounds: Normal heart sounds.  Pulmonary:     Effort: Pulmonary effort is normal.     Breath sounds: Normal breath sounds.  Abdominal:     General: There is no distension.     Palpations: Abdomen is soft. There is no mass.  Skin:    General: Skin is warm.  Neurological:     General: No focal deficit present.     Mental Status: He is alert and oriented to person, place, and time.  Psychiatric:        Mood and Affect: Mood normal.        Behavior: Behavior normal.     LABORATORY DATA:  I have reviewed the labs as listed.  CBC Latest Ref Rng & Units 05/30/2019 05/22/2019 05/08/2019  WBC 4.0 - 10.5 K/uL 3.6(L) 6.9 5.8  Hemoglobin 13.0 - 17.0 g/dL  12.6(L) 13.5 14.3  Hematocrit 39.0 - 52.0 % 38.0(L) 41.6 42.9  Platelets 150 - 400 K/uL 173 253 332   CMP Latest Ref Rng & Units 05/30/2019 05/22/2019 04/24/2019  Glucose 70 - 99 mg/dL 133(H) 162(H) 135(H)  BUN 6 - 20 mg/dL 14 13 15   Creatinine 0.61 - 1.24 mg/dL 0.71 0.81 0.99  Sodium 135 - 145 mmol/L 135 137 137  Potassium 3.5 - 5.1 mmol/L 4.2 3.4(L) 3.9  Chloride 98 - 111 mmol/L 98 102 102  CO2 22 - 32 mmol/L 26 26 24   Calcium 8.9 - 10.3 mg/dL 9.3 8.8(L) 9.1  Total Protein 6.5 - 8.1 g/dL 7.1 7.1 7.7  Total Bilirubin 0.3 - 1.2 mg/dL 0.6 0.8 0.8  Alkaline Phos 38 - 126 U/L 107 109 125  AST 15 - 41 U/L 20 29 18   ALT 0 - 44 U/L 23 29 25     DIAGNOSTIC IMAGING:  I have independently reviewed the scans and discussed with the patient.   ASSESSMENT:  1.  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 -Chemoradiation therapy with gemcitabine and docetaxel followed by wide local excision. -Pathology on 11/10/2018 showed liposarcoma with 80% necrosis, grade 2, ypT4, NX, positive deep margin, amplification of MDM 2 detected.  Amplification is associated with well-differentiated liposarcoma/atypical lipomatous tumors and dedifferentiated liposarcoma. -CT CAP in the ER on 04/24/2019 showed widespread pulmonary metastatic lesions throughout both lungs, largest left upper lobe mass measuring 4.7 x 4.2 cm.  Mass in the inferior lingula measures 4.12 x 3.2 cm. -Bone scan on 05/02/2019 did not show any evidence of metastatic disease. -2D echo on 05/02/2019 shows EF 60 to 65%. -Adriamycin every 3 weeks started on 05/22/2019.    PLAN:  1.  Metastatic dedifferentiated liposarcoma: -Cycle 1 of Adriamycin on 05/22/2019. -He felt tired and still has some shortness of breath on exertion. -I reviewed his CBC and LFTs which are within normal limits.  LDH is 181. -I will proceed with cycle 2  of Adriamycin at full dose today.  We will see him back in 3 weeks for follow-up. -We will plan to repeat CT CAP after cycle 3 and immediately prior to cycle 4.  2.  Dry cough: -This is likely from widespread lung metastatic disease. -Continue hydrocodone syrup as needed.  3.  Anxiety: -He is taking  Xanax 0.25 mg at bedtime as needed.  4.  Left kidney lesion: -CT scan on 04/24/2019 showed mass arising in the periphery of the upper pole of the left kidney measuring 1.3 x 1.4 cm. -The scans were compared to CT scan from [facility] in August 2020 and was stable.    Orders placed this encounter:  No orders of the defined types were placed in this encounter.    Leonard Jack, MD [facility] [phone]   I, Jacqualyn Posey, am acting as a scribe for Dr. Sanda Linger.  I, Leonard Jack MD, have reviewed the above documentation for accuracy and completeness, and I agree with the above.

## 2019-06-27 ENCOUNTER — Encounter (HOSPITAL_COMMUNITY): Payer: Self-pay | Admitting: *Deleted

## 2019-07-04 ENCOUNTER — Inpatient Hospital Stay (HOSPITAL_COMMUNITY): Payer: 59

## 2019-07-04 ENCOUNTER — Inpatient Hospital Stay (HOSPITAL_BASED_OUTPATIENT_CLINIC_OR_DEPARTMENT_OTHER): Payer: 59 | Admitting: Nurse Practitioner

## 2019-07-04 ENCOUNTER — Other Ambulatory Visit: Payer: Self-pay

## 2019-07-04 VITALS — BP 129/84 | HR 70 | Temp 97.5°F | Resp 18

## 2019-07-04 DIAGNOSIS — C499 Malignant neoplasm of connective and soft tissue, unspecified: Secondary | ICD-10-CM

## 2019-07-04 DIAGNOSIS — C4922 Malignant neoplasm of connective and soft tissue of left lower limb, including hip: Secondary | ICD-10-CM

## 2019-07-04 DIAGNOSIS — Z5111 Encounter for antineoplastic chemotherapy: Secondary | ICD-10-CM | POA: Diagnosis not present

## 2019-07-04 DIAGNOSIS — Z95828 Presence of other vascular implants and grafts: Secondary | ICD-10-CM

## 2019-07-04 LAB — CBC WITH DIFFERENTIAL/PLATELET
Abs Immature Granulocytes: 0.15 10*3/uL — ABNORMAL HIGH (ref 0.00–0.07)
Basophils Absolute: 0.1 10*3/uL (ref 0.0–0.1)
Basophils Relative: 2 %
Eosinophils Absolute: 0 10*3/uL (ref 0.0–0.5)
Eosinophils Relative: 0 %
HCT: 38.5 % — ABNORMAL LOW (ref 39.0–52.0)
Hemoglobin: 12.5 g/dL — ABNORMAL LOW (ref 13.0–17.0)
Immature Granulocytes: 3 %
Lymphocytes Relative: 19 %
Lymphs Abs: 1.1 10*3/uL (ref 0.7–4.0)
MCH: 30 pg (ref 26.0–34.0)
MCHC: 32.5 g/dL (ref 30.0–36.0)
MCV: 92.3 fL (ref 80.0–100.0)
Monocytes Absolute: 0.9 10*3/uL (ref 0.1–1.0)
Monocytes Relative: 17 %
Neutro Abs: 3.4 10*3/uL (ref 1.7–7.7)
Neutrophils Relative %: 59 %
Platelets: 419 10*3/uL — ABNORMAL HIGH (ref 150–400)
RBC: 4.17 MIL/uL — ABNORMAL LOW (ref 4.22–5.81)
RDW: 16.3 % — ABNORMAL HIGH (ref 11.5–15.5)
WBC: 5.6 10*3/uL (ref 4.0–10.5)
nRBC: 0 % (ref 0.0–0.2)

## 2019-07-04 LAB — COMPREHENSIVE METABOLIC PANEL
ALT: 30 U/L (ref 0–44)
AST: 26 U/L (ref 15–41)
Albumin: 3.7 g/dL (ref 3.5–5.0)
Alkaline Phosphatase: 111 U/L (ref 38–126)
Anion gap: 7 (ref 5–15)
BUN: 10 mg/dL (ref 6–20)
CO2: 24 mmol/L (ref 22–32)
Calcium: 8.7 mg/dL — ABNORMAL LOW (ref 8.9–10.3)
Chloride: 107 mmol/L (ref 98–111)
Creatinine, Ser: 0.7 mg/dL (ref 0.61–1.24)
GFR calc Af Amer: >60 mL/min (ref >60)
GFR calc non Af Amer: >60 mL/min (ref >60)
Glucose, Bld: 117 mg/dL — ABNORMAL HIGH (ref 70–99)
Potassium: 3.7 mmol/L (ref 3.5–5.1)
Sodium: 138 mmol/L (ref 135–145)
Total Bilirubin: 0.6 mg/dL (ref 0.3–1.2)
Total Protein: 7 g/dL (ref 6.5–8.1)

## 2019-07-04 LAB — LACTATE DEHYDROGENASE: LDH: 153 U/L (ref 98–192)

## 2019-07-04 LAB — MAGNESIUM: Magnesium: 2.3 mg/dL (ref 1.7–2.4)

## 2019-07-04 MED ORDER — PALONOSETRON HCL INJECTION 0.25 MG/5ML
INTRAVENOUS | Status: AC
  Filled 2019-07-04: qty 5

## 2019-07-04 MED ORDER — DOXORUBICIN HCL CHEMO IV INJECTION 2 MG/ML
75.0000 mg/m2 | Freq: Once | INTRAVENOUS | Status: AC
  Administered 2019-07-04: 174 mg via INTRAVENOUS
  Filled 2019-07-04: qty 87

## 2019-07-04 MED ORDER — HYDROCODONE-HOMATROPINE 5-1.5 MG/5ML PO SYRP: ORAL_SOLUTION | ORAL | 0 refills | Status: DC

## 2019-07-04 MED ORDER — SODIUM CHLORIDE 0.9 % IV SOLN
150.0000 mg | Freq: Once | INTRAVENOUS | Status: AC
  Administered 2019-07-04: 150 mg via INTRAVENOUS
  Filled 2019-07-04: qty 150
  Filled 2019-07-04: qty 5

## 2019-07-04 MED ORDER — PALONOSETRON HCL INJECTION 0.25 MG/5ML
0.2500 mg | Freq: Once | INTRAVENOUS | Status: AC
  Administered 2019-07-04: 0.25 mg via INTRAVENOUS

## 2019-07-04 MED ORDER — SODIUM CHLORIDE 0.9 % IV SOLN
10.0000 mg | Freq: Once | INTRAVENOUS | Status: AC
  Administered 2019-07-04: 10 mg via INTRAVENOUS
  Filled 2019-07-04: qty 10

## 2019-07-04 MED ORDER — HEPARIN SOD (PORK) LOCK FLUSH 100 UNIT/ML IV SOLN
500.0000 [IU] | Freq: Once | INTRAVENOUS | Status: AC | PRN
  Administered 2019-07-04: 500 [IU]

## 2019-07-04 MED ORDER — ALPRAZOLAM 0.25 MG PO TABS: 0.2500 mg | ORAL_TABLET | Freq: Two times a day (BID) | ORAL | 0 refills | Status: DC | PRN

## 2019-07-04 MED ORDER — SODIUM CHLORIDE 0.9% FLUSH
10.0000 mL | INTRAVENOUS | Status: DC | PRN
  Administered 2019-07-04: 10 mL

## 2019-07-04 MED ORDER — SODIUM CHLORIDE 0.9 % IV SOLN: Freq: Once | INTRAVENOUS | Status: AC

## 2019-07-04 NOTE — Progress Notes (Signed)
Patient presents today for treatment and follow up visit with RLockamy NP. Verbal order received to proceed with treatment. Labs reviewed.   Treatment given today per MD orders. Tolerated infusion without adverse affects. Vital signs stable. No complaints at this time. Discharged from clinic ambulatory. F/U with [facility] as scheduled.

## 2019-07-04 NOTE — Patient Instructions (Signed)

## 2019-07-04 NOTE — Patient Instructions (Signed)
[facility] at [facility] Discharge Instructions  Labs drawn from portacath today   Thank you for choosing [facility] at [facility] to provide your oncology and hematology care.  To afford each patient quality time with our provider, please arrive at least 15 minutes before your scheduled appointment time.   If you have a lab appointment with the Cancer Center please come in thru the Main Entrance and check in at the main information desk.  You need to re-schedule your appointment should you arrive 10 or more minutes late.  We strive to give you quality time with our providers, and arriving late affects you and other patients whose appointments are after yours.  Also, if you no show three or more times for appointments you may be dismissed from the clinic at the providers discretion.     Again, thank you for choosing [facility].  Our hope is that these requests will decrease the amount of time that you wait before being seen by our physicians.       _____________________________________________________________  Should you have questions after your visit to [facility], please contact our office at [phone] between the hours of 8:00 a.m. and 4:30 p.m.  Voicemails left after 4:00 p.m. will not be returned until the following business day.  For prescription refill requests, have your pharmacy contact our office and allow 72 hours.    Due to Covid, you will need to wear a mask upon entering the hospital. If you do not have a mask, a mask will be given to you at the Main Entrance upon arrival. For doctor visits, patients may have 1 support person with them. For treatment visits, patients can not have anyone with them due to social distancing guidelines and our immunocompromised population.     

## 2019-07-04 NOTE — Assessment & Plan Note (Addendum)
1.  Metastatic dedifferentiated liposarcoma: -CT chest showing multiple lung masses, largest in the posterior part of the left upper lobe.  Adenopathy in the mediastinum and hilum.  Left upper pole hypodense lesion concerning for renal cell carcinoma. -Bone scan on 05/02/2019 did not show any evidence of metastatic disease. -First cycle of Adriamycin 75 mg/m on 05/22/2019. -He felt weak with the first cycle with nausea.  He did report some heartburn which improved with Pepto-Bismol. -Denies any mucositis or hand-foot reaction on examination. -Labs done on 07/04/2019 potassium 3.7, creatinine 0.70, LFTs WNL, WBC 5.6, hemoglobin 12.5, platelets 419. -He will proceed with cycle 3 today. -The plan is to repeat scans after 3 cycles.  If he gets partial response, plan is to continue for 6 cycles and then close surveillance. -We will see him back in 2 weeks with scans.  2.  Dry cough: -Continue hydrocodone syrup which is helping with cough at nighttime.  3.  Anxiety: -Continue Xanax 0.25 milligrams 3 times a day as needed.  4.  Left kidney lesion: -CT scan on 04/24/2019 showed a mass arising in the periphery of the upper pole of the left kidney measuring 1.3 x 1.4 cm. -This is stable compared to the CT scans from August 2020.  5.  High risk drug monitoring: -2D echo on 05/02/2019 with a EF of 60 to 65%. -We will closely monitor with periodic echocardiograms.

## 2019-07-04 NOTE — Patient Instructions (Signed)
[place] at [facility] Discharge Instructions  Follow up in 3 weeks   Thank you for choosing [place] at [facility] to provide your oncology and hematology care.  To afford each patient quality time with our provider, please arrive at least 15 minutes before your scheduled appointment time.   If you have a lab appointment with the [facility] please come in thru the Main Entrance and check in at the main information desk.  You need to re-schedule your appointment should you arrive 10 or more minutes late.  We strive to give you quality time with our providers, and arriving late affects you and other patients whose appointments are after yours.  Also, if you no show three or more times for appointments you may be dismissed from the clinic at the providers discretion.     Again, thank you for choosing [facility].  Our hope is that these requests will decrease the amount of time that you wait before being seen by our physicians.       _____________________________________________________________  Should you have questions after your visit to [facility], please contact our office at [phone] between the hours of 8:00 a.m. and 4:30 p.m.  Voicemails left after 4:00 p.m. will not be returned until the following business day.  For prescription refill requests, have your pharmacy contact our office and allow 72 hours.    Due to Covid, you will need to wear a mask upon entering the hospital. If you do not have a mask, a mask will be given to you at the Main Entrance upon arrival. For doctor visits, patients may have 1 support person with them. For treatment visits, patients can not have anyone with them due to social distancing guidelines and our immunocompromised population.

## 2019-07-04 NOTE — Progress Notes (Signed)
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 Rhoads 59 y.o. male returns for routine follow-up for liposarcoma.  Patient reports he was weak for 3 to 4 days after his treatment.  He has been slowly improving.  He denies any hand or foot rash.  He  denies any mucositis.  Denies any nausea, vomiting, or diarrhea. Denies any new pains. Had not noticed any recent bleeding such as epistaxis, hematuria or hematochezia. Denies recent chest pain on exertion, shortness of breath on minimal exertion, pre-syncopal episodes, or palpitations. Denies any numbness or tingling in hands or feet. Denies any recent fevers, infections, or recent hospitalizations. Patient reports appetite at 50% and energy level at 25%.     REVIEW OF SYSTEMS:  Review of Systems  Constitutional: Positive for fatigue.  Respiratory: Positive for cough and shortness of breath.   Neurological: Positive for headaches.  All other systems reviewed and are negative.    PAST MEDICAL/SURGICAL HISTORY:  Past Medical History:  Diagnosis Date  . Cancer ([place])   . Port-A-Cath in place 05/19/2019   Past Surgical History:  Procedure Laterality Date  . CHOLECYSTECTOMY    . ESOPHAGEAL DILATION N/A 06/27/2017   Procedure: ESOPHAGEAL DILATION;  Surgeon: Danie Binder, MD;  Location: [facility];  Service: Endoscopy;  Laterality: N/A;  . ESOPHAGOGASTRODUODENOSCOPY N/A 06/27/2017   Procedure: ESOPHAGOGASTRODUODENOSCOPY (EGD);  Surgeon: Danie Binder, MD;  Location: [facility];  Service: Endoscopy;  Laterality: N/A;  . IR IMAGING GUIDED PORT INSERTION  05/04/2019  . knee sx       SOCIAL HISTORY:  Social History   Socioeconomic History  . Marital status: Married    Spouse name: Not on file  . Number of children: 1  . Years of education: Not on file  . Highest education level: Not on file  Occupational History  . Occupation: Unemployed  Tobacco Use  . Smoking status: Former Research scientist (life sciences)  .  Smokeless tobacco: Never Used  Vaping Use  . Vaping Use: Never used  Substance and Sexual Activity  . Alcohol use: Yes    Comment: 1-2 drinks per month  . Drug use: Never  . Sexual activity: Not Currently  Other Topics Concern  . Not on file  Social History Narrative   DOES  CONSTRUCTION. MARRIED. RARE ETOH. NO TOBACCO.   Social Determinants of Health   Financial Resource Strain: Medium Risk  . Difficulty of Paying Living Expenses: Somewhat hard  Food Insecurity: No Food Insecurity  . Worried About Charity fundraiser in the Last Year: Never true  . Ran Out of Food in the Last Year: Never true  Transportation Needs: No Transportation Needs  . Lack of Transportation (Medical): No  . Lack of Transportation (Non-Medical): No  Physical Activity: Inactive  . Days of Exercise per Week: 0 days  . Minutes of Exercise per Session: 0 min  Stress: Stress Concern Present  . Feeling of Stress : To some extent  Social Connections: Moderately Integrated  . Frequency of Communication with Friends and Family: More than three times a week  . Frequency of Social Gatherings with Friends and Family: Once a week  . Attends Religious Services: More than 4 times per year  . Active Member of Clubs or Organizations: No  . Attends Archivist Meetings: Never  . Marital Status: Married  Human resources officer Violence: Not At Risk  . Fear of Current or Ex-Partner: No  . Emotionally Abused: No  . Physically Abused: No  . Sexually Abused: No    FAMILY HISTORY:  Family History  Problem Relation Age of Onset  . Heart disease Father   . Cancer Maternal Aunt   . Cancer Maternal Uncle   . Cancer Paternal Uncle   . Vision loss Maternal Grandfather   . Colon cancer Neg Hx   . Colon polyps Neg Hx     CURRENT MEDICATIONS:  Outpatient Encounter Medications as of 07/04/2019  Medication Sig  . ALPRAZolam (XANAX) 0.25 MG tablet Take 1 tablet (0.25 mg total) by mouth 2 (two) times daily as needed for anxiety.  Marland Kitchen DOXORUBICIN HCL IV Inject 75 mg/m2 into the vein every 21 ( twenty-one) days.  Marland Kitchen HYDROcodone-homatropine (HYCODAN) 5-1.5 MG/5ML syrup SMARTSIG:1 Teaspoon By Mouth Every 6 Hours PRN  . lidocaine-prilocaine (EMLA) cream Apply a small amount to port a cath site and cover  with plastic wrap 1 hour prior to chemotherapy appointments (Patient not taking: Reported on 07/04/2019)  . prochlorperazine (COMPAZINE) 10 MG tablet Take 1 tablet (10 mg total) by mouth every 6 (six) hours as needed (Nausea or vomiting). (Patient not taking: Reported on 07/04/2019)  . triamcinolone cream (KENALOG) 0.1 % APPLY TO AFFECTED AREACTHREE (3) TIMES DAILY AS NEEDED FOR RASH. (Patient not taking: Reported on 07/04/2019)   No facility-administered encounter medications on file as of 07/04/2019.    ALLERGIES:  No Known Allergies   PHYSICAL EXAM:  ECOG Performance status: 1  Vitals:   07/04/19 0815  BP: 125/84  Pulse: 79  Resp: 18  Temp: 98.6 F (37 C)  SpO2: 97%   Filed Weights   07/04/19 0815  Weight: 235 lb 4.8 oz (106.7 kg)   Physical Exam Constitutional:      Appearance: Normal appearance. He is normal weight.  Cardiovascular:     Rate and Rhythm: Normal rate and regular rhythm.     Heart sounds: Normal heart sounds.  Pulmonary:     Effort:  Pulmonary effort is normal.     Breath sounds: Normal breath sounds.  Abdominal:     General: Bowel sounds are normal.     Palpations: Abdomen is soft.  Musculoskeletal:        General: Normal range of motion.  Skin:    General: Skin is warm.  Neurological:     Mental Status: He is alert and oriented to person, place, and time. Mental status is at baseline.  Psychiatric:        Mood and Affect: Mood normal.        Behavior: Behavior normal.        Thought Content: Thought content normal.        Judgment: Judgment normal.      LABORATORY DATA:  I have reviewed the labs as listed.  CBC    Component Value Date/Time   WBC 5.6 07/04/2019 0813   RBC 4.17 (L) 07/04/2019 0813   HGB 12.5 (L) 07/04/2019 0813   HCT 38.5 (L) 07/04/2019 0813   PLT 419 (H) 07/04/2019 0813   MCV 92.3 07/04/2019 0813   MCH 30.0 07/04/2019 0813   MCHC 32.5 07/04/2019 0813   RDW 16.3 (H) 07/04/2019 0813   LYMPHSABS 1.1 07/04/2019 0813    MONOABS 0.9 07/04/2019 0813   EOSABS 0.0 07/04/2019 0813   BASOSABS 0.1 07/04/2019 0813   CMP Latest Ref Rng & Units 07/04/2019 06/13/2019 05/30/2019  Glucose 70 - 99 mg/dL 117(H) 106(H) 133(H)  BUN 6 - 20 mg/dL 10 9 14   Creatinine 0.61 - 1.24 mg/dL 0.70 0.74 0.71  Sodium 135 - 145 mmol/L 138 136 135  Potassium 3.5 - 5.1 mmol/L 3.7 4.1 4.2  Chloride 98 - 111 mmol/L 107 102 98  CO2 22 - 32 mmol/L 24 25 26   Calcium 8.9 - 10.3 mg/dL 8.7(L) 9.3 9.3  Total Protein 6.5 - 8.1 g/dL 7.0 7.6 7.1  Total Bilirubin 0.3 - 1.2 mg/dL 0.6 0.5 0.6  Alkaline Phos 38 - 126 U/L 111 123 107  AST 15 - 41 U/L 26 32 20  ALT 0 - 44 U/L 30 39 23   All questions were answered to patient's stated satisfaction. Encouraged patient to call with any new concerns or questions before his next visit to the cancer center and we can certain see him sooner, if needed.     ASSESSMENT & PLAN:  Liposarcoma ([place]) 1.  Metastatic dedifferentiated liposarcoma: -CT chest showing multiple lung masses, largest in the posterior part of the left upper lobe.  Adenopathy in the mediastinum and hilum.  Left upper pole hypodense lesion concerning for renal cell carcinoma. -Bone scan on 05/02/2019 did not show any evidence of metastatic disease. -First cycle of Adriamycin 75 mg/m on 05/22/2019. -He felt weak with the first cycle with nausea.  He did report some heartburn which improved with Pepto-Bismol. -Denies any mucositis or hand-foot reaction on examination. -Labs done on 07/04/2019 potassium 3.7, creatinine 0.70, LFTs WNL, WBC 5.6, hemoglobin 12.5, platelets 419. -He will proceed with cycle 3 today. -The plan is to repeat scans after 3 cycles.  If he gets partial response, plan is to continue for 6 cycles and then close surveillance. -We will see him back in 2 weeks with scans.  2.  Dry cough: -Continue hydrocodone syrup which is helping with cough at nighttime.  3.  Anxiety: -Continue Xanax 0.25 milligrams 3 times a day as  needed.  4.  Left kidney lesion: -CT scan on 04/24/2019 showed a mass arising in the  periphery of the upper pole of the left kidney measuring 1.3 x 1.4 cm. -This is stable compared to the CT scans from August 2020.  5.  High risk drug monitoring: -2D echo on 05/02/2019 with a EF of 60 to 65%. -We will closely monitor with periodic echocardiograms.     Orders placed this encounter:  Orders Placed This Encounter  Procedures  . CT CHEST W CONTRAST  . CT ABDOMEN PELVIS W CONTRAST  . Lactate dehydrogenase  . Magnesium  . CBC with Differential/Platelet  . Comprehensive metabolic panel      Francene Finders, FNP-C [place] [phone]

## 2019-07-14 ENCOUNTER — Other Ambulatory Visit (HOSPITAL_COMMUNITY): Payer: Self-pay | Admitting: Hematology

## 2019-07-14 DIAGNOSIS — Z95828 Presence of other vascular implants and grafts: Secondary | ICD-10-CM

## 2019-07-14 DIAGNOSIS — C4922 Malignant neoplasm of connective and soft tissue of left lower limb, including hip: Secondary | ICD-10-CM

## 2019-07-19 ENCOUNTER — Ambulatory Visit (HOSPITAL_COMMUNITY)
Admission: RE | Admit: 2019-07-19 | Discharge: 2019-07-19 | Disposition: A | Discharge status: Home / Self Care | Payer: 59 | Source: Ambulatory Visit | Attending: Nurse Practitioner | Admitting: Nurse Practitioner

## 2019-07-19 ENCOUNTER — Other Ambulatory Visit: Payer: Self-pay

## 2019-07-19 DIAGNOSIS — C499 Malignant neoplasm of connective and soft tissue, unspecified: Secondary | ICD-10-CM | POA: Diagnosis not present

## 2019-07-19 MED ORDER — IOHEXOL 300 MG/ML  SOLN
100.0000 mL | Freq: Once | INTRAMUSCULAR | Status: AC | PRN
  Administered 2019-07-19: 100 mL via INTRAVENOUS

## 2019-07-21 ENCOUNTER — Inpatient Hospital Stay (HOSPITAL_COMMUNITY): Payer: 59 | Attending: Hematology | Admitting: Hematology

## 2019-07-21 ENCOUNTER — Encounter (HOSPITAL_COMMUNITY): Payer: Self-pay | Admitting: Hematology

## 2019-07-21 ENCOUNTER — Other Ambulatory Visit: Payer: Self-pay

## 2019-07-21 VITALS — BP 133/83 | Temp 97.4°F | Resp 16 | Wt 235.2 lb

## 2019-07-21 DIAGNOSIS — Z79899 Other long term (current) drug therapy: Secondary | ICD-10-CM | POA: Insufficient documentation

## 2019-07-21 DIAGNOSIS — R918 Other nonspecific abnormal finding of lung field: Secondary | ICD-10-CM | POA: Insufficient documentation

## 2019-07-21 DIAGNOSIS — Z87891 Personal history of nicotine dependence: Secondary | ICD-10-CM | POA: Insufficient documentation

## 2019-07-21 DIAGNOSIS — R109 Unspecified abdominal pain: Secondary | ICD-10-CM | POA: Insufficient documentation

## 2019-07-21 DIAGNOSIS — Z452 Encounter for adjustment and management of vascular access device: Secondary | ICD-10-CM | POA: Diagnosis not present

## 2019-07-21 DIAGNOSIS — G478 Other sleep disorders: Secondary | ICD-10-CM | POA: Diagnosis not present

## 2019-07-21 DIAGNOSIS — R600 Localized edema: Secondary | ICD-10-CM | POA: Insufficient documentation

## 2019-07-21 DIAGNOSIS — F419 Anxiety disorder, unspecified: Secondary | ICD-10-CM | POA: Diagnosis not present

## 2019-07-21 DIAGNOSIS — R05 Cough: Secondary | ICD-10-CM | POA: Diagnosis not present

## 2019-07-21 DIAGNOSIS — R5383 Other fatigue: Secondary | ICD-10-CM | POA: Insufficient documentation

## 2019-07-21 DIAGNOSIS — R0789 Other chest pain: Secondary | ICD-10-CM | POA: Insufficient documentation

## 2019-07-21 DIAGNOSIS — Z923 Personal history of irradiation: Secondary | ICD-10-CM | POA: Diagnosis not present

## 2019-07-21 DIAGNOSIS — Z5111 Encounter for antineoplastic chemotherapy: Secondary | ICD-10-CM | POA: Diagnosis not present

## 2019-07-21 DIAGNOSIS — C499 Malignant neoplasm of connective and soft tissue, unspecified: Secondary | ICD-10-CM | POA: Diagnosis not present

## 2019-07-21 NOTE — Patient Instructions (Addendum)
[place] at [facility] Discharge Instructions  You were seen today by Dr. Delton Coombes. He went over your recent results and scans. Dr. Delton Coombes will see you back in 2 weeks for labs and follow up.   Thank you for choosing [facility] to provide your oncology and hematology care.  To afford each patient quality time with our provider, please arrive at least 15 minutes before your scheduled appointment time.   If you have a lab appointment with the [facility] please come in thru the Main Entrance and check in at the main information desk  You need to re-schedule your appointment should you arrive 10 or more minutes late.  We strive to give you quality time with our providers, and arriving late affects you and other patients whose appointments are after yours.  Also, if you no show three or more times for appointments you may be dismissed from the clinic at the providers discretion.     Again, thank you for choosing [facility].  Our hope is that these requests will decrease the amount of time that you wait before being seen by our physicians.       _____________________________________________________________  Should you have questions after your visit to [facility], please contact our office at [phone] between the hours of 8:00 a.m. and 4:30 p.m.  Voicemails left after 4:00 p.m. will not be returned until the following business day.  For prescription refill requests, have your pharmacy contact our office and allow 72 hours.    Cancer Center Support Programs:   > Cancer Support Group  2nd Tuesday of the month 1pm-2pm, Journey Room

## 2019-07-21 NOTE — Progress Notes (Signed)
Leonard [name], Leonard [name]   CLINIC:  Medical Oncology/Hematology  PCP:  Leonard Squibb, MD [address] Leonard [name] [address] [phone]   REASON FOR VISIT:  Follow-up for metastatic dedifferentiated liposarcoma  PRIOR THERAPY: Chemoradiation therapy with gemcitabine and docetaxel followed by wide local excision on 11/10/2018.  NGS Results: Pending  CURRENT THERAPY: Adriamycin every 3 weeks  BRIEF ONCOLOGIC HISTORY:  Oncology History  Liposarcoma of left lower extremity (Leonard [name])  04/25/2019 Initial Diagnosis   Liposarcoma of left lower extremity (Leonard [name])   05/22/2019 -  Chemotherapy   The patient had DOXOrubicin (ADRIAMYCIN) chemo injection 174 mg, 75 mg/m2 = 174 mg, Intravenous,  Once, 3 of 6 cycles Administration: 174 mg (05/22/2019), 174 mg (06/13/2019), 174 mg (07/04/2019) palonosetron (ALOXI) injection 0.25 mg, 0.25 mg, Intravenous,  Once, 3 of 6 cycles Administration: 0.25 mg (05/22/2019), 0.25 mg (06/13/2019), 0.25 mg (07/04/2019) fosaprepitant (EMEND) 150 mg in sodium chloride 0.9 % 145 mL IVPB, 150 mg, Intravenous,  Once, 3 of 6 cycles Administration: 150 mg (05/22/2019), 150 mg (06/13/2019), 150 mg (07/04/2019)  for chemotherapy treatment.    Liposarcoma (Leonard [name])  05/15/2019 Initial Diagnosis   Liposarcoma (Leonard [name])   05/15/2019 Cancer Staging   Staging form: Primary Cutaneous B-Cell/T-Cell Lymphoma (Non-MF/SS Lymphoma), AJCC 8th Edition - Clinical stage from 05/15/2019: Leonard [name], pM1 - Signed by Leonard Jack, MD on 05/15/2019     CANCER STAGING: Cancer Staging [facility]) Staging form: Primary Cutaneous B-Cell/T-Cell Lymphoma (Non-MF/SS Lymphoma), AJCC 8th Edition - Clinical stage from 05/15/2019: Leonard [name], pM1 - Signed by Leonard Jack, MD on 05/15/2019  Liposarcoma of left lower extremity (Leonard [name]) Staging form: Primary Cutaneous B-Cell/T-Cell Lymphoma (Non-MF/SS Lymphoma), AJCC 8th Edition - Clinical: No stage assigned -  Unsigned   INTERVAL HISTORY:  [name], a 59 y.o. male, returns for routine follow-up of his metastatic dedifferentiated liposarcoma. Leonard [name] was last seen on 06/13/2019.  Today he is accompanied by his wife. He reports feeling bad 2-3 days after the treatment, but then it resolves. His dry cough has not improved and happens in episodes more frequently, especially when he lies down. The last time his cough was productive was 3-4 weeks ago. He uses the cough syrup and cough drops before he goes to sleep, which helps a little. His energy generally improves a few days after the chemo treatment.  He quit smoking 7 years ago, smoking 1 PPD for 30-35 years.   REVIEW OF SYSTEMS:  Review of Systems  HENT:   Negative for mouth sores.   Respiratory: Positive for cough and shortness of breath.   Cardiovascular: Positive for leg swelling.  Gastrointestinal: Positive for abdominal pain (upper mid abdomen) and nausea (intermittent).  Neurological: Positive for headaches.  Psychiatric/Behavioral: Positive for sleep disturbance (intermittent, d/t cough).  All other systems reviewed and are negative.   PAST MEDICAL/SURGICAL HISTORY:  Past Medical History:  Diagnosis Date  . Cancer (Leonard [name])   . Port-A-Cath in place 05/19/2019   Past Surgical History:  Procedure Laterality Date  . CHOLECYSTECTOMY    . ESOPHAGEAL DILATION N/A 06/27/2017   Procedure: ESOPHAGEAL DILATION;  Surgeon: Danie Binder, MD;  Location: [facility];  Service: Endoscopy;  Laterality: N/A;  . ESOPHAGOGASTRODUODENOSCOPY N/A 06/27/2017   Procedure: ESOPHAGOGASTRODUODENOSCOPY (EGD);  Surgeon: Danie Binder, MD;  Location: [facility];  Service: Endoscopy;  Laterality: N/A;  . IR IMAGING GUIDED PORT INSERTION  05/04/2019  . knee sx  SOCIAL HISTORY:  Social History   Socioeconomic History  . Marital status: Married    Spouse name: Not on file  . Number of children: 1  . Years of education: Not on file  . Highest  education level: Not on file  Occupational History  . Occupation: Unemployed  Tobacco Use  . Smoking status: Former Research scientist (life sciences)  . Smokeless tobacco: Never Used  Vaping Use  . Vaping Use: Never used  Substance and Sexual Activity  . Alcohol use: Yes    Comment: 1-2 drinks per month  . Drug use: Never  . Sexual activity: Not Currently  Other Topics Concern  . Not on file  Social History Narrative   DOES CONSTRUCTION. MARRIED. RARE ETOH. NO TOBACCO.   Social Determinants of Health   Financial Resource Strain: Medium Risk  . Difficulty of Paying Living Expenses: Somewhat hard  Food Insecurity: No Food Insecurity  . Worried About Charity fundraiser in the Last Year: Never true  . Ran Out of Food in the Last Year: Never true  Transportation Needs: No Transportation Needs  . Lack of Transportation (Medical): No  . Lack of Transportation (Non-Medical): No  Physical Activity: Inactive  . Days of Exercise per Week: 0 days  . Minutes of Exercise per Session: 0 min  Stress: Stress Concern Present  . Feeling of Stress : To some extent  Social Connections: Moderately Integrated  . Frequency of Communication with Friends and Family: More than three times a week  . Frequency of Social Gatherings with Friends and Family: Once a week  . Attends Religious Services: More than 4 times per year  . Active Member of Clubs or Organizations: No  . Attends Archivist Meetings: Never  . Marital Status: Married  Human resources officer Violence: Not At Risk  . Fear of Current or Ex-Partner: No  . Emotionally Abused: No  . Physically Abused: No  . Sexually Abused: No    FAMILY HISTORY:  Family History  Problem Relation Age of Onset  . Heart disease Father   . Cancer Maternal Aunt   . Cancer Maternal Uncle   . Cancer Paternal Uncle   . Vision loss Maternal Grandfather   . Colon cancer Neg Hx   . Colon polyps Neg Hx     CURRENT MEDICATIONS:  Current Outpatient Medications  Medication Sig  Dispense Refill  . ALPRAZolam (XANAX) 0.25 MG tablet Take 1 tablet (0.25 mg total) by mouth 2 (two) times daily as needed for anxiety. 30 tablet 0  . DOXORUBICIN HCL IV Inject 75 mg/m2 into the vein every 21 ( twenty-one) days.    Marland Kitchen HYDROcodone-homatropine (HYCODAN) 5-1.5 MG/5ML syrup SMARTSIG:1 Teaspoon By Mouth Every 6 Hours PRN 480 mL 0  . lidocaine-prilocaine (EMLA) cream Apply a small amount to port a cath site and cover with plastic wrap 1 hour prior to chemotherapy appointments (Patient not taking: Reported on 07/04/2019) 30 g 3  . prochlorperazine (COMPAZINE) 10 MG tablet TAKE (1) TABLET BY MOUTH EVERY (6) HOURS AS NEEDED. 30 tablet 0   No current facility-administered medications for this visit.    ALLERGIES:  No Known Allergies  PHYSICAL EXAM:  Performance status (ECOG): 1 - Symptomatic but completely ambulatory  Vitals:   07/21/19 0810  BP: 133/83  Resp: 16  Temp: (!) 97.4 F (36.3 C)  SpO2: 96%   Wt Readings from Last 3 Encounters:  07/21/19 106.7 kg (235 lb 3.2 oz)  07/04/19 106.7 kg (235 lb 4.8 oz)  05/30/19 110.3 kg (243 lb 3.2 oz)   Physical Exam Vitals reviewed.  Constitutional:      Appearance: Normal appearance. He is obese.  Cardiovascular:     Rate and Rhythm: Normal rate and regular rhythm.     Pulses: Normal pulses.     Heart sounds: Normal heart sounds.  Pulmonary:     Effort: Pulmonary effort is normal.     Breath sounds: Normal breath sounds.  Musculoskeletal:     Cervical back: Normal range of motion and neck supple.     Right lower leg: Edema (trace) present.     Left lower leg: Edema (trace) present.  Neurological:     General: No focal deficit present.     Mental Status: He is alert and oriented to person, place, and time.  Psychiatric:        Mood and Affect: Mood normal.        Behavior: Behavior normal.      LABORATORY DATA:  I have reviewed the labs as listed.  CBC Latest Ref Rng & Units 07/04/2019 06/13/2019 05/30/2019  WBC 4.0 -  10.5 K/uL 5.6 7.2 3.6(L)  Hemoglobin 13.0 - 17.0 g/dL 12.5(L) 12.8(L) 12.6(L)  Hematocrit 39 - 52 % 38.5(L) 39.1 38.0(L)  Platelets 150 - 400 K/uL 419(H) 639(H) 173   CMP Latest Ref Rng & Units 07/04/2019 06/13/2019 05/30/2019  Glucose 70 - 99 mg/dL 117(H) 106(H) 133(H)  BUN 6 - 20 mg/dL 10 9 14   Creatinine 0.61 - 1.24 mg/dL 0.70 0.74 0.71  Sodium 135 - 145 mmol/L 138 136 135  Potassium 3.5 - 5.1 mmol/L 3.7 4.1 4.2  Chloride 98 - 111 mmol/L 107 102 98  CO2 22 - 32 mmol/L 24 25 26   Calcium 8.9 - 10.3 mg/dL 8.7(L) 9.3 9.3  Total Protein 6.5 - 8.1 g/dL 7.0 7.6 7.1  Total Bilirubin 0.3 - 1.2 mg/dL 0.6 0.5 0.6  Alkaline Phos 38 - 126 U/L 111 123 107  AST 15 - 41 U/L 26 32 20  ALT 0 - 44 U/L 30 39 23    DIAGNOSTIC IMAGING:  I have independently reviewed the scans and discussed with the patient. CT CHEST W CONTRAST  Result Date: 07/20/2019 CLINICAL DATA:  Metastatic liposarcoma of the lower extremity. EXAM: CT CHEST, ABDOMEN, AND PELVIS WITH CONTRAST TECHNIQUE: Multidetector CT imaging of the chest, abdomen and pelvis was performed following the standard protocol during bolus administration of intravenous contrast. CONTRAST:  140mL OMNIPAQUE IOHEXOL 300 MG/ML  SOLN COMPARISON:  04/24/2019 FINDINGS: CT CHEST FINDINGS Cardiovascular: Heart size upper normal. Coronary artery calcification is evident. Atherosclerotic calcification is noted in the wall of the thoracic aorta. Right Port-A-Cath tip is positioned in the distal SVC. Mediastinum/Nodes: No mediastinal lymphadenopathy. There is no hilar lymphadenopathy. The esophagus has normal imaging features. There is no axillary lymphadenopathy. Lungs/Pleura: Bilateral pulmonary nodules and masses are evident. Dominant cavitary lesion posterior left upper lobe measures 3.3 x 2.7 cm on image 36 of series 3 decreased from 4.7 x 4.2 cm when remeasured in a similar fashion on the prior study. A 2nd dominant lesion in the left parahilar upper lobe measures 3.1 x 3.1  cm on 78/3 today compared to 4.9 x 3.6 cm remeasured in a similar fashion on the previous exam. Nodular disease along the right major fissure is progressive in the interval. 5.4 x 3.1 cm right lower lobe perifissural nodule on 89/3 was 2.1 x 0.9 cm when remeasured on the prior study. Peripheral right middle lobe nodule  measuring 2.7 x 2.1 cm on 92/3 today was 1.0 x 0.9 cm previously (remeasured). Small posteromedial left lower lobe nodule measuring 8 mm today (93/3) was 8 mm previously when remeasured. A linear band of soft tissue attenuation in the anterior right upper lobe (49/3) is at the site of a 1.3 x 1.1 cm nodule seen on the prior exam. Musculoskeletal: No worrisome lytic or sclerotic osseous abnormality. CT ABDOMEN PELVIS FINDINGS Hepatobiliary: No suspicious focal abnormality within the liver parenchyma. Gallbladder is surgically absent. No intrahepatic or extrahepatic biliary dilation. Pancreas: No focal mass lesion. No dilatation of the main duct. No intraparenchymal cyst. No peripancreatic edema. Spleen: No splenomegaly. No focal mass lesion. Adrenals/Urinary Tract: No adrenal nodule or mass. Kidneys unremarkable. 15 mm hypoattenuating lesion in the upper pole left kidney (best seen on delayed image 14 of series 8) is stable since prior. No evidence for hydroureter. The urinary bladder appears normal for the degree of distention. Stomach/Bowel: Stomach is unremarkable. No gastric wall thickening. No evidence of outlet obstruction. Duodenum is normally positioned as is the ligament of Treitz. No small bowel wall thickening. No small bowel dilatation. The terminal ileum is normal. The appendix is normal. No gross colonic mass. No colonic wall thickening. Vascular/Lymphatic: There is abdominal aortic atherosclerosis without aneurysm. There is no gastrohepatic or hepatoduodenal ligament lymphadenopathy. No retroperitoneal or mesenteric lymphadenopathy. No pelvic sidewall lymphadenopathy. Reproductive: The  prostate gland and seminal vesicles are unremarkable. Other: No intraperitoneal free fluid. Musculoskeletal: No worrisome lytic or sclerotic osseous abnormality. IMPRESSION: 1. Mixed response to therapy. The dominant metastatic lung lesions have decreased in size and an anterior right upper lobe 13 mm nodule seen previously has essentially resolved. Unfortunately, some nodules, including along the right major fissure, have progressed while still other pulmonary nodules are unchanged in the interval. 2. No evidence for metastatic disease in the abdomen or pelvis. 3. Stable 15 mm hypoattenuating lesion in the upper pole left kidney. This has attenuation too high to be a simple cyst and while likely a cyst complicated by proteinaceous debris or hemorrhage, neoplasm is not excluded. Close attention on follow-up recommended. 4. Aortic Atherosclerosis (ICD10-I70.0). Electronically Signed   By: Misty Stanley M.D.   On: 07/20/2019 09:28   CT ABDOMEN PELVIS W CONTRAST  Result Date: 07/20/2019 CLINICAL DATA:  Metastatic liposarcoma of the lower extremity. EXAM: CT CHEST, ABDOMEN, AND PELVIS WITH CONTRAST TECHNIQUE: Multidetector CT imaging of the chest, abdomen and pelvis was performed following the standard protocol during bolus administration of intravenous contrast. CONTRAST:  142mL OMNIPAQUE IOHEXOL 300 MG/ML  SOLN COMPARISON:  04/24/2019 FINDINGS: CT CHEST FINDINGS Cardiovascular: Heart size upper normal. Coronary artery calcification is evident. Atherosclerotic calcification is noted in the wall of the thoracic aorta. Right Port-A-Cath tip is positioned in the distal SVC. Mediastinum/Nodes: No mediastinal lymphadenopathy. There is no hilar lymphadenopathy. The esophagus has normal imaging features. There is no axillary lymphadenopathy. Lungs/Pleura: Bilateral pulmonary nodules and masses are evident. Dominant cavitary lesion posterior left upper lobe measures 3.3 x 2.7 cm on image 36 of series 3 decreased from 4.7 x  4.2 cm when remeasured in a similar fashion on the prior study. A 2nd dominant lesion in the left parahilar upper lobe measures 3.1 x 3.1 cm on 78/3 today compared to 4.9 x 3.6 cm remeasured in a similar fashion on the previous exam. Nodular disease along the right major fissure is progressive in the interval. 5.4 x 3.1 cm right lower lobe perifissural nodule on 89/3 was 2.1 x 0.9  cm when remeasured on the prior study. Peripheral right middle lobe nodule measuring 2.7 x 2.1 cm on 92/3 today was 1.0 x 0.9 cm previously (remeasured). Small posteromedial left lower lobe nodule measuring 8 mm today (93/3) was 8 mm previously when remeasured. A linear band of soft tissue attenuation in the anterior right upper lobe (49/3) is at the site of a 1.3 x 1.1 cm nodule seen on the prior exam. Musculoskeletal: No worrisome lytic or sclerotic osseous abnormality. CT ABDOMEN PELVIS FINDINGS Hepatobiliary: No suspicious focal abnormality within the liver parenchyma. Gallbladder is surgically absent. No intrahepatic or extrahepatic biliary dilation. Pancreas: No focal mass lesion. No dilatation of the main duct. No intraparenchymal cyst. No peripancreatic edema. Spleen: No splenomegaly. No focal mass lesion. Adrenals/Urinary Tract: No adrenal nodule or mass. Kidneys unremarkable. 15 mm hypoattenuating lesion in the upper pole left kidney (best seen on delayed image 14 of series 8) is stable since prior. No evidence for hydroureter. The urinary bladder appears normal for the degree of distention. Stomach/Bowel: Stomach is unremarkable. No gastric wall thickening. No evidence of outlet obstruction. Duodenum is normally positioned as is the ligament of Treitz. No small bowel wall thickening. No small bowel dilatation. The terminal ileum is normal. The appendix is normal. No gross colonic mass. No colonic wall thickening. Vascular/Lymphatic: There is abdominal aortic atherosclerosis without aneurysm. There is no gastrohepatic or  hepatoduodenal ligament lymphadenopathy. No retroperitoneal or mesenteric lymphadenopathy. No pelvic sidewall lymphadenopathy. Reproductive: The prostate gland and seminal vesicles are unremarkable. Other: No intraperitoneal free fluid. Musculoskeletal: No worrisome lytic or sclerotic osseous abnormality. IMPRESSION: 1. Mixed response to therapy. The dominant metastatic lung lesions have decreased in size and an anterior right upper lobe 13 mm nodule seen previously has essentially resolved. Unfortunately, some nodules, including along the right major fissure, have progressed while still other pulmonary nodules are unchanged in the interval. 2. No evidence for metastatic disease in the abdomen or pelvis. 3. Stable 15 mm hypoattenuating lesion in the upper pole left kidney. This has attenuation too high to be a simple cyst and while likely a cyst complicated by proteinaceous debris or hemorrhage, neoplasm is not excluded. Close attention on follow-up recommended. 4. Aortic Atherosclerosis (ICD10-I70.0). Electronically Signed   By: Misty Stanley M.D.   On: 07/20/2019 09:28     ASSESSMENT:  1.  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 -Chemoradiation therapy with gemcitabine and docetaxel followed by wide local excision. -Pathology on 11/10/2018 showed liposarcoma with 80% necrosis, grade 2, ypT4, NX, positive deep margin, amplification of MDM 2 detected.  Amplification is associated with well-differentiated liposarcoma/atypical lipomatous tumors and dedifferentiated liposarcoma. -CT CAP in the ER on 04/24/2019 showed widespread pulmonary metastatic lesions throughout both lungs, largest left upper lobe mass measuring 4.7 x 4.2 cm.  Mass in the inferior lingula measures 4.12 x 3.2 cm. -Bone scan on 05/02/2019 did not show any evidence of metastatic disease. -2D echo on 05/02/2019 shows EF  60 to 65%. -Adriamycin every 3 weeks started on 05/22/2019. -CT CAP on 07/19/2019 showed dominant lung lesions have decreased in size.  2 nodules in the right major fissure have progressed while some other pulmonary nodules are unchanged.  No evidence of metastatic disease in the abdomen or pelvis.  2.  Left kidney lesion: -CT on 04/24/2019 showed mass arising in the periphery of the upper lobe of the left kidney measuring 1.3 x 1.4 cm. -CT scan  [facility] in August 2020 was showing left kidney lesion. -Current scan on 07/19/2019 showed 15 mm hypoattenuating lesion in the upper pole left kidney stable since prior.   PLAN:  1.  Metastatic dedifferentiated liposarcoma: -He finished cycle 3 on 07/04/2019. -I have reviewed results of the CT scan with the patient and his wife in detail. -I will discuss the films with radiology.  I will also discuss with Dr. Angelina Ok to see if clinical trials available.  He has not received any phone call from [place] yet. -He plans to go to the beach next week.  He will come back the following week.  2.  Dry cough: -He is continuing to have dry cough predominantly at nighttime. -He is using hydrocodone syrup as needed and Tessalon Perles.  3.  Anxiety: -He is taking Xanax 0.25 mg at bedtime as needed.   Orders placed this encounter:  No orders of the defined types were placed in this encounter.    Leonard Jack, MD [place] [phone]   I, Milinda Antis, am acting as a scribe for Dr. Sanda Linger.  I, Leonard Jack MD, have reviewed the above documentation for accuracy and completeness, and I agree with the above.

## 2019-07-25 ENCOUNTER — Ambulatory Visit (HOSPITAL_COMMUNITY): Payer: 59

## 2019-07-25 ENCOUNTER — Other Ambulatory Visit (HOSPITAL_COMMUNITY): Payer: 59

## 2019-07-25 ENCOUNTER — Ambulatory Visit (HOSPITAL_COMMUNITY): Payer: 59 | Admitting: Hematology

## 2019-08-01 ENCOUNTER — Ambulatory Visit (HOSPITAL_COMMUNITY): Payer: 59 | Admitting: Hematology

## 2019-08-01 ENCOUNTER — Other Ambulatory Visit (HOSPITAL_COMMUNITY): Payer: 59

## 2019-08-01 ENCOUNTER — Ambulatory Visit (HOSPITAL_COMMUNITY): Payer: 59

## 2019-08-01 MED ORDER — OXYCODONE HCL 10 MG PO TABS: 5.0000 mg | ORAL_TABLET | Freq: Three times a day (TID) | ORAL | 0 refills | Status: DC | PRN

## 2019-08-02 ENCOUNTER — Inpatient Hospital Stay (HOSPITAL_COMMUNITY): Payer: 59

## 2019-08-02 ENCOUNTER — Inpatient Hospital Stay (HOSPITAL_COMMUNITY): Payer: 59 | Admitting: Hematology

## 2019-08-02 ENCOUNTER — Other Ambulatory Visit: Payer: Self-pay

## 2019-08-02 VITALS — BP 132/90 | HR 74 | Temp 97.5°F | Resp 18 | Wt 233.6 lb

## 2019-08-02 DIAGNOSIS — C499 Malignant neoplasm of connective and soft tissue, unspecified: Secondary | ICD-10-CM

## 2019-08-02 DIAGNOSIS — Z5111 Encounter for antineoplastic chemotherapy: Secondary | ICD-10-CM | POA: Diagnosis not present

## 2019-08-02 LAB — COMPREHENSIVE METABOLIC PANEL
ALT: 38 U/L (ref 0–44)
AST: 32 U/L (ref 15–41)
Albumin: 3.9 g/dL (ref 3.5–5.0)
Alkaline Phosphatase: 105 U/L (ref 38–126)
Anion gap: 10 (ref 5–15)
BUN: 13 mg/dL (ref 6–20)
CO2: 24 mmol/L (ref 22–32)
Calcium: 9.2 mg/dL (ref 8.9–10.3)
Chloride: 104 mmol/L (ref 98–111)
Creatinine, Ser: 0.72 mg/dL (ref 0.61–1.24)
GFR calc Af Amer: >60 mL/min (ref >60)
GFR calc non Af Amer: >60 mL/min (ref >60)
Glucose, Bld: 105 mg/dL — ABNORMAL HIGH (ref 70–99)
Potassium: 3.9 mmol/L (ref 3.5–5.1)
Sodium: 138 mmol/L (ref 135–145)
Total Bilirubin: 0.7 mg/dL (ref 0.3–1.2)
Total Protein: 7.2 g/dL (ref 6.5–8.1)

## 2019-08-02 LAB — CBC WITH DIFFERENTIAL/PLATELET
Abs Immature Granulocytes: 0.05 10*3/uL (ref 0.00–0.07)
Basophils Absolute: 0 10*3/uL (ref 0.0–0.1)
Basophils Relative: 1 %
Eosinophils Absolute: 0.2 10*3/uL (ref 0.0–0.5)
Eosinophils Relative: 3 %
HCT: 39.8 % (ref 39.0–52.0)
Hemoglobin: 13.1 g/dL (ref 13.0–17.0)
Immature Granulocytes: 1 %
Lymphocytes Relative: 13 %
Lymphs Abs: 0.9 10*3/uL (ref 0.7–4.0)
MCH: 30.6 pg (ref 26.0–34.0)
MCHC: 32.9 g/dL (ref 30.0–36.0)
MCV: 93 fL (ref 80.0–100.0)
Monocytes Absolute: 0.8 10*3/uL (ref 0.1–1.0)
Monocytes Relative: 12 %
Neutro Abs: 4.8 10*3/uL (ref 1.7–7.7)
Neutrophils Relative %: 70 %
Platelets: 248 10*3/uL (ref 150–400)
RBC: 4.28 MIL/uL (ref 4.22–5.81)
RDW: 17 % — ABNORMAL HIGH (ref 11.5–15.5)
WBC: 6.8 10*3/uL (ref 4.0–10.5)
nRBC: 0 % (ref 0.0–0.2)

## 2019-08-02 LAB — LACTATE DEHYDROGENASE: LDH: 157 U/L (ref 98–192)

## 2019-08-02 LAB — MAGNESIUM: Magnesium: 2.3 mg/dL (ref 1.7–2.4)

## 2019-08-02 MED ORDER — SODIUM CHLORIDE 0.9% FLUSH
10.0000 mL | Freq: Once | INTRAVENOUS | Status: AC
  Administered 2019-08-02: 10 mL

## 2019-08-02 MED ORDER — HEPARIN SOD (PORK) LOCK FLUSH 100 UNIT/ML IV SOLN
500.0000 [IU] | Freq: Once | INTRAVENOUS | Status: AC
  Administered 2019-08-02: 500 [IU] via INTRAVENOUS

## 2019-08-02 NOTE — Patient Instructions (Signed)
[place] at [facility] Discharge Instructions  You were seen today by Dr. Delton Coombes. He went over your recent results. The chemotherapy medications, gemcitabine and docetaxel, are given once a week for 3 weeks and then 1 week off; the infusion will take over 2.5 hours. The usual side effects are a decrease in your blood counts and numbness and tingling in your extremities. You will also need another injection to boost your white blood cell count about 24 hours after receiving your chemotherapy. Dr. Delton Coombes will see you back in 1 week for labs and follow up.   Thank you for choosing [place] at [facility] to provide your oncology and hematology care.  To afford each patient quality time with our provider, please arrive at least 15 minutes before your scheduled appointment time.   If you have a lab appointment with the [facility] please come in thru the Main Entrance and check in at the main information desk  You need to re-schedule your appointment should you arrive 10 or more minutes late.  We strive to give you quality time with our providers, and arriving late affects you and other patients whose appointments are after yours.  Also, if you no show three or more times for appointments you may be dismissed from the clinic at the providers discretion.     Again, thank you for choosing [facility].  Our hope is that these requests will decrease the amount of time that you wait before being seen by our physicians.       _____________________________________________________________  Should you have questions after your visit to [facility], please contact our office at [phone] between the hours of 8:00 a.m. and 4:30 p.m.  Voicemails left after 4:00 p.m. will not be returned until the following business day.  For prescription refill requests, have your pharmacy contact our office and allow 72 hours.    Cancer  Center Support Programs:   > Cancer Support Group  2nd Tuesday of the month 1pm-2pm, Journey Room

## 2019-08-02 NOTE — Progress Notes (Signed)
Leonard [name], [place] [address]   CLINIC:  Medical Oncology/Hematology  PCP:  Celene Squibb, MD [address] [name] [place] [address] [phone]   REASON FOR VISIT:  Follow-up for metastatic dedifferentiated liposarcoma  PRIOR THERAPY: Chemoradiation therapy with gemcitabine and docetaxel followed by wide local excision on 11/10/2018  NGS Results: Pending  CURRENT THERAPY: Gemcitabine and docetaxel.  BRIEF ONCOLOGIC HISTORY:  Oncology History  Liposarcoma of left lower extremity ([place])  04/25/2019 Initial Diagnosis   Liposarcoma of left lower extremity ([place])   05/22/2019 -  Chemotherapy   The patient had DOXOrubicin (ADRIAMYCIN) chemo injection 174 mg, 75 mg/m2 = 174 mg, Intravenous,  Once, 3 of 6 cycles Administration: 174 mg (05/22/2019), 174 mg (06/13/2019), 174 mg (07/04/2019) palonosetron (ALOXI) injection 0.25 mg, 0.25 mg, Intravenous,  Once, 3 of 6 cycles Administration: 0.25 mg (05/22/2019), 0.25 mg (06/13/2019), 0.25 mg (07/04/2019) fosaprepitant (EMEND) 150 mg in sodium chloride 0.9 % 145 mL IVPB, 150 mg, Intravenous,  Once, 3 of 6 cycles Administration: 150 mg (05/22/2019), 150 mg (06/13/2019), 150 mg (07/04/2019)  for chemotherapy treatment.    Liposarcoma ([place])  05/15/2019 Initial Diagnosis   Liposarcoma ([place])   05/15/2019 Cancer Staging   Staging form: Primary Cutaneous B-Cell/T-Cell Lymphoma (Non-MF/SS Lymphoma), AJCC 8th Edition - Clinical stage from 05/15/2019: Leonard [name], pM1 - Signed by [name], MD on 05/15/2019     CANCER STAGING: Cancer Staging Liposarcoma [facility]) Staging form: Primary Cutaneous B-Cell/T-Cell Lymphoma (Non-MF/SS Lymphoma), AJCC 8th Edition - Clinical stage from 05/15/2019: Leonard [name], pM1 - Signed by [name], MD on 05/15/2019  Liposarcoma of left lower extremity ([place]) Staging form: Primary Cutaneous B-Cell/T-Cell Lymphoma (Non-MF/SS Lymphoma), AJCC 8th Edition - Clinical: No stage assigned  - Unsigned   INTERVAL HISTORY:  Leonard [name], a 59 y.o. male, returns for routine follow-up of his metastatic dedifferentiated liposarcoma. Vi was last seen on 07/21/2019.  Today he is accompanied by his wife. He complains of cough and pain in his right armpit. The pain is intermittent, lasting 10 seconds to 20 minutes, and is present when he wakes up and coughs. His cough is dry. He is drinking the Hycodan 2-3 times a day, but says that the pain is hardly alleviated, whereas the oxycodone h elps alleviate the pain more. He currently does not have any neuropathy.   REVIEW OF SYSTEMS:  Review of Systems  Constitutional: Positive for appetite change (mildly decreased) and fatigue (depleted).  Respiratory: Positive for cough and shortness of breath.   Cardiovascular: Positive for chest pain (R chest under armpit) and leg swelling.  Neurological: Positive for dizziness. Negative for numbness.  All other systems reviewed and are negative.   PAST MEDICAL/SURGICAL HISTORY:  Past Medical History:  Diagnosis Date  . Cancer ([place])   . Port-A-Cath in place 05/19/2019   Past Surgical History:  Procedure Laterality Date  . CHOLECYSTECTOMY    . ESOPHAGEAL DILATION N/A 06/27/2017   Procedure: ESOPHAGEAL DILATION;  Surgeon: Danie Binder, MD;  Location: [facility];  Service: Endoscopy;  Laterality: N/A;  . ESOPHAGOGASTRODUODENOSCOPY N/A 06/27/2017   Procedure: ESOPHAGOGASTRODUODENOSCOPY (EGD);  Surgeon: Danie Binder, MD;  Location: [facility];  Service: Endoscopy;  Laterality: N/A;  . IR IMAGING GUIDED PORT INSERTION  05/04/2019  . knee sx      SOCIAL HISTORY:  Social History   Socioeconomic History  . Marital status: Married    Spouse name: Not on file  .  Number of children: 1  . Years of education: Not on file  . Highest education level: Not on file  Occupational History  . Occupation: Unemployed  Tobacco Use  . Smoking status: Former Research scientist (life sciences)  . Smokeless tobacco: Never  Used  Vaping Use  . Vaping Use: Never used  Substance and Sexual Activity  . Alcohol use: Yes    Comment: 1-2 drinks per month  . Drug use: Never  . Sexual activity: Not Currently  Other Topics Concern  . Not on file  Social History Narrative   DOES CONSTRUCTION. MARRIED. RARE ETOH. NO TOBACCO.   Social Determinants of Health   Financial Resource Strain: Medium Risk  . Difficulty of Paying Living Expenses: Somewhat hard  Food Insecurity: No Food Insecurity  . Worried About Charity fundraiser in the Last Year: Never true  . Ran Out of Food in the Last Year: Never true  Transportation Needs: No Transportation Needs  . Lack of Transportation (Medical): No  . Lack of Transportation (Non-Medical): No  Physical Activity: Inactive  . Days of Exercise per Week: 0 days  . Minutes of Exercise per Session: 0 min  Stress: Stress Concern Present  . Feeling of Stress : To some extent  Social Connections: Moderately Integrated  . Frequency of Communication with Friends and Family: More than three times a week  . Frequency of Social Gatherings with Friends and Family: Once a week  . Attends Religious Services: More than 4 times per year  . Active Member of Clubs or Organizations: No  . Attends Archivist Meetings: Never  . Marital Status: Married  Human resources officer Violence: Not At Risk  . Fear of Current or Ex-Partner: No  . Emotionally Abused: No  . Physically Abused: No  . Sexually Abused: No    FAMILY HISTORY:  Family History  Problem Relation Age of Onset  . Heart disease Father   . Cancer Maternal Aunt   . Cancer Maternal Uncle   . Cancer Paternal Uncle   . Vision loss Maternal Grandfather   . Colon cancer Neg Hx   . Colon polyps Neg Hx     CURRENT MEDICATIONS:  Current Outpatient Medications  Medication Sig Dispense Refill  . DOXORUBICIN HCL IV Inject 75 mg/m2 into the vein every 21 ( twenty-one) days.    . ALPRAZolam (XANAX) 0.25 MG tablet Take 1 tablet  (0.25 mg total) by mouth 2 (two) times daily as needed for anxiety. (Patient not taking: Reported on 08/02/2019) 30 tablet 0  . HYDROcodone-homatropine (HYCODAN) 5-1.5 MG/5ML syrup SMARTSIG:1 Teaspoon By Mouth Every 6 Hours PRN (Patient not taking: Reported on 08/02/2019) 480 mL 0  . lidocaine-prilocaine (EMLA) cream Apply a small amount to port a cath site and cover with plastic wrap 1 hour prior to chemotherapy appointments (Patient not taking: Reported on 07/04/2019) 30 g 3  . Oxycodone HCl 10 MG TABS Take 0.5 tablets (5 mg total) by mouth every 8 (eight) hours as needed. (Patient not taking: Reported on 08/02/2019) 30 tablet 0  . prochlorperazine (COMPAZINE) 10 MG tablet TAKE (1) TABLET BY MOUTH EVERY (6) HOURS AS NEEDED. (Patient not taking: Reported on 08/02/2019) 30 tablet 0   No current facility-administered medications for this visit.    ALLERGIES:  No Known Allergies  PHYSICAL EXAM:  Performance status (ECOG): 1 - Symptomatic but completely ambulatory  Vitals:   08/02/19 1220  BP: 132/90  Pulse: 74  Resp: 18  Temp: (!) 97.5 F (36.4 C)  SpO2: 97%   Wt Readings from Last 3 Encounters:  08/02/19 233 lb 9.6 oz (106 kg)  07/21/19 235 lb 3.2 oz (106.7 kg)  07/04/19 235 lb 4.8 oz (106.7 kg)   Physical Exam Vitals reviewed.  Constitutional:      Appearance: Normal appearance. He is obese.  Cardiovascular:     Heart sounds: Normal heart sounds.  Pulmonary:     Effort: Pulmonary effort is normal.     Breath sounds: Normal breath sounds.  Chest:     Chest wall: No tenderness.  Neurological:     General: No focal deficit present.     Mental Status: He is alert and oriented to person, place, and time.  Psychiatric:        Mood and Affect: Mood normal.        Behavior: Behavior normal.      LABORATORY DATA:  I have reviewed the labs as listed.  CBC Latest Ref Rng & Units 08/02/2019 07/04/2019 06/13/2019  WBC 4.0 - 10.5 K/uL 6.8 5.6 7.2  Hemoglobin 13.0 - 17.0 g/dL 13.1  12.5(L) 12.8(L)  Hematocrit 39 - 52 % 39.8 38.5(L) 39.1  Platelets 150 - 400 K/uL 248 419(H) 639(H)   CMP Latest Ref Rng & Units 08/02/2019 07/04/2019 06/13/2019  Glucose 70 - 99 mg/dL 105(H) 117(H) 106(H)  BUN 6 - 20 mg/dL 13 10 9   Creatinine 0.61 - 1.24 mg/dL 0.72 0.70 0.74  Sodium 135 - 145 mmol/L 138 138 136  Potassium 3.5 - 5.1 mmol/L 3.9 3.7 4.1  Chloride 98 - 111 mmol/L 104 107 102  CO2 22 - 32 mmol/L 24 24 25   Calcium 8.9 - 10.3 mg/dL 9.2 8.7(L) 9.3  Total Protein 6.5 - 8.1 g/dL 7.2 7.0 7.6  Total Bilirubin 0.3 - 1.2 mg/dL 0.7 0.6 0.5  Alkaline Phos 38 - 126 U/L 105 111 123  AST 15 - 41 U/L 32 26 32  ALT 0 - 44 U/L 38 30 39    DIAGNOSTIC IMAGING:  I have independently reviewed the scans and discussed with the patient. CT CHEST W CONTRAST  Result Date: 07/20/2019 CLINICAL DATA:  Metastatic liposarcoma of the lower extremity. EXAM: CT CHEST, ABDOMEN, AND PELVIS WITH CONTRAST TECHNIQUE: Multidetector CT imaging of the chest, abdomen and pelvis was performed following the standard protocol during bolus administration of intravenous contrast. CONTRAST:  141m OMNIPAQUE IOHEXOL 300 MG/ML  SOLN COMPARISON:  04/24/2019 FINDINGS: CT CHEST FINDINGS Cardiovascular: Heart size upper normal. Coronary artery calcification is evident. Atherosclerotic calcification is noted in the wall of the thoracic aorta. Right Port-A-Cath tip is positioned in the distal SVC. Mediastinum/Nodes: No mediastinal lymphadenopathy. There is no hilar lymphadenopathy. The esophagus has normal imaging features. There is no axillary lymphadenopathy. Lungs/Pleura: Bilateral pulmonary nodules and masses are evident. Dominant cavitary lesion posterior left upper lobe measures 3.3 x 2.7 cm on image 36 of series 3 decreased from 4.7 x 4.2 cm when remeasured in a similar fashion on the prior study. A 2nd dominant lesion in the left parahilar upper lobe measures 3.1 x 3.1 cm on 78/3 today compared to 4.9 x 3.6 cm remeasured in a  similar fashion on the previous exam. Nodular disease along the right major fissure is progressive in the interval. 5.4 x 3.1 cm right lower lobe perifissural nodule on 89/3 was 2.1 x 0.9 cm when remeasured on the prior study. Peripheral right middle lobe nodule measuring 2.7 x 2.1 cm on 92/3 today was 1.0 x 0.9 cm previously (remeasured). Small posteromedial  left lower lobe nodule measuring 8 mm today (93/3) was 8 mm previously when remeasured. A linear band of soft tissue attenuation in the anterior right upper lobe (49/3) is at the site of a 1.3 x 1.1 cm nodule seen on the prior exam. Musculoskeletal: No worrisome lytic or sclerotic osseous abnormality. CT ABDOMEN PELVIS FINDINGS Hepatobiliary: No suspicious focal abnormality within the liver parenchyma. Gallbladder is surgically absent. No intrahepatic or extrahepatic biliary dilation. Pancreas: No focal mass lesion. No dilatation of the main duct. No intraparenchymal cyst. No peripancreatic edema. Spleen: No splenomegaly. No focal mass lesion. Adrenals/Urinary Tract: No adrenal nodule or mass. Kidneys unremarkable. 15 mm hypoattenuating lesion in the upper pole left kidney (best seen on delayed image 14 of series 8) is stable since prior. No evidence for hydroureter. The urinary bladder appears normal for the degree of distention. Stomach/Bowel: Stomach is unremarkable. No gastric wall thickening. No evidence of outlet obstruction. Duodenum is normally positioned as is the ligament of Treitz. No small bowel wall thickening. No small bowel dilatation. The terminal ileum is normal. The appendix is normal. No gross colonic mass. No colonic wall thickening. Vascular/Lymphatic: There is abdominal aortic atherosclerosis without aneurysm. There is no gastrohepatic or hepatoduodenal ligament lymphadenopathy. No retroperitoneal or mesenteric lymphadenopathy. No pelvic sidewall lymphadenopathy. Reproductive: The prostate gland and seminal vesicles are unremarkable.  Other: No intraperitoneal free fluid. Musculoskeletal: No worrisome lytic or sclerotic osseous abnormality. IMPRESSION: 1. Mixed response to therapy. The dominant metastatic lung lesions have decreased in size and an anterior right upper lobe 13 mm nodule seen previously has essentially resolved. Unfortunately, some nodules, including along the right major fissure, have progressed while still other pulmonary nodules are unchanged in the interval. 2. No evidence for metastatic disease in the abdomen or pelvis. 3. Stable 15 mm hypoattenuating lesion in the upper pole left kidney. This has attenuation too high to be a simple cyst and while likely a cyst complicated by proteinaceous debris or hemorrhage, neoplasm is not excluded. Close attention on follow-up recommended. 4. Aortic Atherosclerosis (ICD10-I70.0). Electronically Signed   By: Misty Stanley M.D.   On: 07/20/2019 09:28   CT ABDOMEN PELVIS W CONTRAST  Result Date: 07/20/2019 CLINICAL DATA:  Metastatic liposarcoma of the lower extremity. EXAM: CT CHEST, ABDOMEN, AND PELVIS WITH CONTRAST TECHNIQUE: Multidetector CT imaging of the chest, abdomen and pelvis was performed following the standard protocol during bolus administration of intravenous contrast. CONTRAST:  183m OMNIPAQUE IOHEXOL 300 MG/ML  SOLN COMPARISON:  04/24/2019 FINDINGS: CT CHEST FINDINGS Cardiovascular: Heart size upper normal. Coronary artery calcification is evident. Atherosclerotic calcification is noted in the wall of the thoracic aorta. Right Port-A-Cath tip is positioned in the distal SVC. Mediastinum/Nodes: No mediastinal lymphadenopathy. There is no hilar lymphadenopathy. The esophagus has normal imaging features. There is no axillary lymphadenopathy. Lungs/Pleura: Bilateral pulmonary nodules and masses are evident. Dominant cavitary lesion posterior left upper lobe measures 3.3 x 2.7 cm on image 36 of series 3 decreased from 4.7 x 4.2 cm when remeasured in a similar fashion on the  prior study. A 2nd dominant lesion in the left parahilar upper lobe measures 3.1 x 3.1 cm on 78/3 today compared to 4.9 x 3.6 cm remeasured in a similar fashion on the previous exam. Nodular disease along the right major fissure is progressive in the interval. 5.4 x 3.1 cm right lower lobe perifissural nodule on 89/3 was 2.1 x 0.9 cm when remeasured on the prior study. Peripheral right middle lobe nodule measuring 2.7 x 2.1 cm  on 92/3 today was 1.0 x 0.9 cm previously (remeasured). Small posteromedial left lower lobe nodule measuring 8 mm today (93/3) was 8 mm previously when remeasured. A linear band of soft tissue attenuation in the anterior right upper lobe (49/3) is at the site of a 1.3 x 1.1 cm nodule seen on the prior exam. Musculoskeletal: No worrisome lytic or sclerotic osseous abnormality. CT ABDOMEN PELVIS FINDINGS Hepatobiliary: No suspicious focal abnormality within the liver parenchyma. Gallbladder is surgically absent. No intrahepatic or extrahepatic biliary dilation. Pancreas: No focal mass lesion. No dilatation of the main duct. No intraparenchymal cyst. No peripancreatic edema. Spleen: No splenomegaly. No focal mass lesion. Adrenals/Urinary Tract: No adrenal nodule or mass. Kidneys unremarkable. 15 mm hypoattenuating lesion in the upper pole left kidney (best seen on delayed image 14 of series 8) is stable since prior. No evidence for hydroureter. The urinary bladder appears normal for the degree of distention. Stomach/Bowel: Stomach is unremarkable. No gastric wall thickening. No evidence of outlet obstruction. Duodenum is normally positioned as is the ligament of Treitz. No small bowel wall thickening. No small bowel dilatation. The terminal ileum is normal. The appendix is normal. No gross colonic mass. No colonic wall thickening. Vascular/Lymphatic: There is abdominal aortic atherosclerosis without aneurysm. There is no gastrohepatic or hepatoduodenal ligament lymphadenopathy. No retroperitoneal  or mesenteric lymphadenopathy. No pelvic sidewall lymphadenopathy. Reproductive: The prostate gland and seminal vesicles are unremarkable. Other: No intraperitoneal free fluid. Musculoskeletal: No worrisome lytic or sclerotic osseous abnormality. IMPRESSION: 1. Mixed response to therapy. The dominant metastatic lung lesions have decreased in size and an anterior right upper lobe 13 mm nodule seen previously has essentially resolved. Unfortunately, some nodules, including along the right major fissure, have progressed while still other pulmonary nodules are unchanged in the interval. 2. No evidence for metastatic disease in the abdomen or pelvis. 3. Stable 15 mm hypoattenuating lesion in the upper pole left kidney. This has attenuation too high to be a simple cyst and while likely a cyst complicated by proteinaceous debris or hemorrhage, neoplasm is not excluded. Close attention on follow-up recommended. 4. Aortic Atherosclerosis (ICD10-I70.0). Electronically Signed   By: Misty Stanley M.D.   On: 07/20/2019 09:28     ASSESSMENT:  1. 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 -Chemoradiation therapy with gemcitabine and docetaxel followed by wide local excision. -Pathology on 11/10/2018 showed liposarcoma with 80% necrosis, grade 2, ypT4, NX, positive deep margin, amplification of MDM 2 detected. Amplification is associated with well-differentiated liposarcoma/atypical lipomatous tumors and dedifferentiated liposarcoma. -CT CAP in the ER on 04/24/2019 showed widespread pulmonary metastatic lesions throughout both lungs, largest left upper lobe mass measuring 4.7 x 4.2 cm. Mass in the inferior lingula measures 4.12 x 3.2 cm. -Bone scan on 05/02/2019 did not show any evidence of metastatic disease. -2D echo on 05/02/2019 shows EF 60 to 65%. -Adriamycin every 3 weeks started on  05/22/2019. -CT CAP on 07/19/2019 showed dominant lung lesions have decreased in size.  2 nodules in the right major fissure have progressed while some other pulmonary nodules are unchanged.  No evidence of metastatic disease in the abdomen or pelvis.  2.  Left kidney lesion: -CT on 04/24/2019 showed mass arising in the periphery of the upper lobe of the left kidney measuring 1.3 x 1.4 cm. -[facility] in August 2020 was showing left kidney lesion. -Current scan on 07/19/2019 showed 15 mm hypoattenuating  lesion in the upper pole left kidney stable since prior.   PLAN:  1. Metastatic dedifferentiated liposarcoma: -He has progressed after 3 cycles of Adriamycin.  I have recommended switching therapy. -I have reached out to Dr. Angelina Ok who is away this week.  I will reach out to him next Monday to see if he has any protocols available in the second line setting because of rapid progression on the first-line treatment. -We talked about backup treatment with second line therapy containing gemcitabine and docetaxel.  We discussed the side effects of both agents in detail.  Because of myelotoxicity we will provide primary prophylaxis with long-acting G-CSF. -We will also consider sending his original tissue from [facility] for foundation 1 testing for checking NTRK and MSI testing.  2. Dry cough: -He is continuing to have dry cough predominantly at nighttime. -He will continue hydrocodone syrup 2-3 times a day.  I will give a refill.  3. Anxiety: -Continue Xanax 0.25 mg at bedtime as needed.  4.  Right-sided chest wall pain: -He developed right-sided chest wall pain for the last 1 week. -I have started him on oxycodone 10 mg half tablet every 8 hours as needed which is helping.   Orders placed this encounter:  No orders of the defined types were placed in this encounter.    [name], MD [place] [phone]   I, Milinda Antis, am  acting as a scribe for Dr. Sanda Linger.  I, [name] MD, have reviewed the above documentation for accuracy and completeness, and I agree with the above.

## 2019-08-02 NOTE — Progress Notes (Signed)
No treatment today verbal order Dr. Katragadda.  

## 2019-08-02 NOTE — Progress Notes (Signed)
DISCONTINUE OFF PATHWAY REGIMEN - Other   [id number]:Doxorubicin 75 mg/m2 IV D1 q21 Days:   A cycle is every 21 days:     Doxorubicin   **Always confirm dose/schedule in your pharmacy ordering system**  REASON: Disease Progression PRIOR TREATMENT: Doxorubicin 75 mg/m2 IV D1 q21 Days TREATMENT RESPONSE: Progressive Disease (PD)  START OFF PATHWAY REGIMEN - Other   [id number]:Docetaxel 75 mg/m2 D8 + Gemcitabine 900 mg/m2 D1,8 q21 Days:   A cycle is every 21 days:     Gemcitabine      Docetaxel      [id number]   **Always confirm dose/schedule in your pharmacy ordering system**  Patient Characteristics: Intent of Therapy: Non-Curative / Palliative Intent, Discussed with Patient

## 2019-08-07 ENCOUNTER — Other Ambulatory Visit (HOSPITAL_COMMUNITY): Payer: Self-pay | Admitting: *Deleted

## 2019-08-07 DIAGNOSIS — C499 Malignant neoplasm of connective and soft tissue, unspecified: Secondary | ICD-10-CM

## 2019-08-07 MED ORDER — HYDROCODONE-HOMATROPINE 5-1.5 MG/5ML PO SYRP: ORAL_SOLUTION | ORAL | 0 refills | Status: DC

## 2019-08-08 NOTE — Progress Notes (Signed)
.   Pharmacist Chemotherapy Monitoring - Initial Assessment    Anticipated start date: 08/11/19  Regimen:  . Are orders appropriate based on the patient's diagnosis, regimen, and cycle? Yes . Does the plan date match the patient's scheduled date? Yes . Is the sequencing of drugs appropriate? Yes . Are the premedications appropriate for the patient's regimen? Yes . Prior Authorization for treatment is: Approved o If applicable, is the correct biosimilar selected based on the patient's insurance? not applicable  Organ Function and Labs: Marland Kitchen Are dose adjustments needed based on the patient's renal function, hepatic function, or hematologic function? No . Are appropriate labs ordered prior to the start of patient's treatment? Yes . Other organ system assessment, if indicated: N/A . The following baseline labs, if indicated, have been ordered: N/A  Dose Assessment: . Are the drug doses appropriate? Yes . Are the following correct: o Drug concentrations Yes o IV fluid compatible with drug Yes o Administration routes Yes o Timing of therapy Yes . If applicable, does the patient have documented access for treatment and/or plans for port-a-cath placement? yes . If applicable, have lifetime cumulative doses been properly documented and assessed? not applicable Lifetime Dose Tracking  . Doxorubicin: 226.316 mg/m2 (522 mg) = 50.29 % of the maximum lifetime dose of 450 mg/m2  o   Toxicity Monitoring/Prevention: . The patient has the following take home antiemetics prescribed: Prochlorperazine . The patient has the following take home medications prescribed: N/A . Medication allergies and previous infusion related reactions, if applicable, have been reviewed and addressed. Yes . The patient's current medication list has been assessed for drug-drug interactions with their chemotherapy regimen. no significant drug-drug interactions were identified on review.  Order Review: . Are the treatment plan  orders signed? Yes . Is the patient scheduled to see a provider prior to their treatment? Yes  I verify that I have reviewed each item in the above checklist and answered each question accordingly.  Wynona Neat 08/08/2019 3:25 PM

## 2019-08-11 ENCOUNTER — Inpatient Hospital Stay (HOSPITAL_COMMUNITY): Payer: 59

## 2019-08-11 ENCOUNTER — Other Ambulatory Visit: Payer: Self-pay

## 2019-08-11 ENCOUNTER — Telehealth: Payer: Self-pay | Admitting: Pharmacist

## 2019-08-11 ENCOUNTER — Inpatient Hospital Stay (HOSPITAL_BASED_OUTPATIENT_CLINIC_OR_DEPARTMENT_OTHER): Payer: 59 | Admitting: Hematology

## 2019-08-11 VITALS — BP 123/77 | HR 77 | Temp 97.8°F

## 2019-08-11 DIAGNOSIS — C4922 Malignant neoplasm of connective and soft tissue of left lower limb, including hip: Secondary | ICD-10-CM

## 2019-08-11 DIAGNOSIS — Z5111 Encounter for antineoplastic chemotherapy: Secondary | ICD-10-CM | POA: Diagnosis not present

## 2019-08-11 DIAGNOSIS — C499 Malignant neoplasm of connective and soft tissue, unspecified: Secondary | ICD-10-CM

## 2019-08-11 DIAGNOSIS — Z95828 Presence of other vascular implants and grafts: Secondary | ICD-10-CM

## 2019-08-11 LAB — COMPREHENSIVE METABOLIC PANEL
ALT: 35 U/L (ref 0–44)
AST: 31 U/L (ref 15–41)
Albumin: 3.8 g/dL (ref 3.5–5.0)
Alkaline Phosphatase: 117 U/L (ref 38–126)
Anion gap: 10 (ref 5–15)
BUN: 15 mg/dL (ref 6–20)
CO2: 23 mmol/L (ref 22–32)
Calcium: 8.9 mg/dL (ref 8.9–10.3)
Chloride: 104 mmol/L (ref 98–111)
Creatinine, Ser: 0.86 mg/dL (ref 0.61–1.24)
GFR calc Af Amer: >60 mL/min (ref >60)
GFR calc non Af Amer: >60 mL/min (ref >60)
Glucose, Bld: 153 mg/dL — ABNORMAL HIGH (ref 70–99)
Potassium: 3.5 mmol/L (ref 3.5–5.1)
Sodium: 137 mmol/L (ref 135–145)
Total Bilirubin: 0.5 mg/dL (ref 0.3–1.2)
Total Protein: 7.2 g/dL (ref 6.5–8.1)

## 2019-08-11 LAB — CBC WITH DIFFERENTIAL/PLATELET
Abs Immature Granulocytes: 0.04 10*3/uL (ref 0.00–0.07)
Basophils Absolute: 0 10*3/uL (ref 0.0–0.1)
Basophils Relative: 1 %
Eosinophils Absolute: 1 10*3/uL — ABNORMAL HIGH (ref 0.0–0.5)
Eosinophils Relative: 12 %
HCT: 40.8 % (ref 39.0–52.0)
Hemoglobin: 13.1 g/dL (ref 13.0–17.0)
Immature Granulocytes: 1 %
Lymphocytes Relative: 13 %
Lymphs Abs: 1.1 10*3/uL (ref 0.7–4.0)
MCH: 30.2 pg (ref 26.0–34.0)
MCHC: 32.1 g/dL (ref 30.0–36.0)
MCV: 94 fL (ref 80.0–100.0)
Monocytes Absolute: 0.6 10*3/uL (ref 0.1–1.0)
Monocytes Relative: 7 %
Neutro Abs: 5.2 10*3/uL (ref 1.7–7.7)
Neutrophils Relative %: 66 %
Platelets: 191 10*3/uL (ref 150–400)
RBC: 4.34 MIL/uL (ref 4.22–5.81)
RDW: 16.6 % — ABNORMAL HIGH (ref 11.5–15.5)
WBC: 8 10*3/uL (ref 4.0–10.5)
nRBC: 0 % (ref 0.0–0.2)

## 2019-08-11 LAB — MAGNESIUM: Magnesium: 2.1 mg/dL (ref 1.7–2.4)

## 2019-08-11 LAB — LACTATE DEHYDROGENASE: LDH: 125 U/L (ref 98–192)

## 2019-08-11 MED ORDER — ONDANSETRON HCL 4 MG/2ML IJ SOLN
8.0000 mg | Freq: Once | INTRAMUSCULAR | Status: AC
  Administered 2019-08-11: 8 mg via INTRAVENOUS
  Filled 2019-08-11: qty 4

## 2019-08-11 MED ORDER — SODIUM CHLORIDE 0.9% FLUSH: 10.0000 mL | INTRAVENOUS | Status: DC | PRN

## 2019-08-11 MED ORDER — SODIUM CHLORIDE 0.9 % IV SOLN: Freq: Once | INTRAVENOUS | Status: AC

## 2019-08-11 MED ORDER — SODIUM CHLORIDE 0.9 % IV SOLN
900.0000 mg/m2 | Freq: Once | INTRAVENOUS | Status: AC
  Administered 2019-08-11: 2052 mg via INTRAVENOUS
  Filled 2019-08-11: qty 53.97

## 2019-08-11 MED ORDER — HEPARIN SOD (PORK) LOCK FLUSH 100 UNIT/ML IV SOLN
500.0000 [IU] | Freq: Once | INTRAVENOUS | Status: AC | PRN
  Administered 2019-08-11: 500 [IU]

## 2019-08-11 NOTE — Progress Notes (Signed)
Seen by Dr Raliegh Ip today.  Reviewed labs.  Okay to proceed with treatment.  He will get on-pro with day 8 treatment.  It has been changed in treatment plan.   Aris R Nazir tolerated treatment well toady without incidence. Vital signs WNL prior to discharge.  Discharged with wife via wheelchair.

## 2019-08-11 NOTE — Progress Notes (Signed)
[place], [place] [address]   CLINIC:  Medical Oncology/Hematology  PCP:  Celene Squibb, MD [name] [place] [address] [phone]   REASON FOR VISIT:  Follow-up for metastatic dedifferentiated liposarcoma  PRIOR THERAPY: Chemoradiation therapy with gemcitabine and docetaxel followed by wide local excision on 11/10/2018  NGS Results: Pending  CURRENT THERAPY: Gemcitabine and docetaxel  BRIEF ONCOLOGIC HISTORY:  Oncology History  Liposarcoma of left lower extremity ([place])  04/25/2019 Initial Diagnosis   Liposarcoma of left lower extremity ([place])   05/22/2019 - 07/04/2019 Chemotherapy   The patient had DOXOrubicin (ADRIAMYCIN) chemo injection 174 mg, 75 mg/m2 = 174 mg, Intravenous,  Once, 3 of 6 cycles Administration: 174 mg (05/22/2019), 174 mg (06/13/2019), 174 mg (07/04/2019) palonosetron (ALOXI) injection 0.25 mg, 0.25 mg, Intravenous,  Once, 3 of 6 cycles Administration: 0.25 mg (05/22/2019), 0.25 mg (06/13/2019), 0.25 mg (07/04/2019) fosaprepitant (EMEND) 150 mg in sodium chloride 0.9 % 145 mL IVPB, 150 mg, Intravenous,  Once, 3 of 6 cycles Administration: 150 mg (05/22/2019), 150 mg (06/13/2019), 150 mg (07/04/2019)  for chemotherapy treatment.    08/12/2019 -  Chemotherapy   The patient had palonosetron (ALOXI) injection 0.25 mg, 0.25 mg, Intravenous,  Once, 0 of 4 cycles ondansetron (ZOFRAN) injection 8 mg, 8 mg (100 % of original dose 8 mg), Intravenous,  Once, 0 of 4 cycles Dose modification: 8 mg (original dose 8 mg, Cycle 1) pegfilgrastim-jmdb (FULPHILA) injection 6 mg, 6 mg, Subcutaneous,  Once, 0 of 4 cycles DOCEtaxel (TAXOTERE) 170 mg in sodium chloride 0.9 % 250 mL chemo infusion, 75 mg/m2 = 170 mg (75 % of original dose 100 mg/m2), Intravenous,  Once, 0 of 4 cycles Dose modification: 75 mg/m2 (75 % of original dose 100 mg/m2, Cycle 1, Reason: Provider Judgment) gemcitabine (GEMZAR) 2,052 mg in sodium chloride 0.9 % 250 mL  chemo infusion, 900 mg/m2 = 2,052 mg, Intravenous,  Once, 0 of 4 cycles  for chemotherapy treatment.    Liposarcoma ([place])  05/15/2019 Initial Diagnosis   Liposarcoma ([place])   05/15/2019 Cancer Staging   Staging form: Primary Cutaneous B-Cell/T-Cell Lymphoma (Non-MF/SS Lymphoma), AJCC 8th Edition - Clinical stage from 05/15/2019: [name], pM1 - Signed by [name], MD on 05/15/2019     CANCER STAGING: Cancer Staging Liposarcoma [facility]) Staging form: Primary Cutaneous B-Cell/T-Cell Lymphoma (Non-MF/SS Lymphoma), AJCC 8th Edition - Clinical stage from 05/15/2019: [name], pM1 - Signed by [name], MD on 05/15/2019  Liposarcoma of left lower extremity ([place]) Staging form: Primary Cutaneous B-Cell/T-Cell Lymphoma (Non-MF/SS Lymphoma), AJCC 8th Edition - Clinical: No stage assigned - Unsigned   INTERVAL HISTORY:  [name], a 59 y.o. male, returns for routine follow-up and consideration for next cycle of chemotherapy. Leonard [name] was last seen on 08/02/2019.  Due for initiating cycle #1 of gemcitabine and docetaxel today.   Today he is accompanied by his wife. His cough is stable and he still gets chest pain over his right chest, especially after he coughs; the pain may last anywhere from a couple minutes to a couple hours. The chest pain lasted most of the previous day and he takes oxycodone for it, which helps.   Overall, he feels ready for next cycle of chemo today.    REVIEW OF SYSTEMS:  Review of Systems  Constitutional: Positive for appetite change (moderately decreased) and fatigue (depleted).  Respiratory: Positive for cough and shortness of breath.   Cardiovascular: Positive for chest  pain (R sided CP w/ coughing).  Neurological: Positive for dizziness.  Psychiatric/Behavioral: Positive for depression and sleep disturbance.  All other systems reviewed and are negative.   PAST MEDICAL/SURGICAL HISTORY:  Past Medical History:  Diagnosis Date   Cancer ([place])     Port-A-Cath in place 05/19/2019   Past Surgical History:  Procedure Laterality Date   CHOLECYSTECTOMY     ESOPHAGEAL DILATION N/A 06/27/2017   Procedure: ESOPHAGEAL DILATION;  Surgeon: Danie Binder, MD;  Location[facility];  Service: Endoscopy;  Laterality: N/A;   ESOPHAGOGASTRODUODENOSCOPY N/A 06/27/2017   Procedure: ESOPHAGOGASTRODUODENOSCOPY (EGD);  Surgeon: Danie Binder, MD;  Location: [facility];  Service: Endoscopy;  Laterality: N/A;   IR IMAGING GUIDED PORT INSERTION  05/04/2019   knee sx      SOCIAL HISTORY:  Social History   Socioeconomic History   Marital status: Married    Spouse name: Not on file   Number of children: 1   Years of education: Not on file   Highest education level: Not on file  Occupational History   Occupation: Unemployed  Tobacco Use   Smoking status: Former Smoker   Smokeless tobacco: Never Used  Scientific laboratory technician Use: Never used  Substance and Sexual Activity   Alcohol use: Yes    Comment: 1-2 drinks per month   Drug use: Never   Sexual activity: Not Currently  Other Topics Concern   Not on file  Social History Narrative   DOES CONSTRUCTION. MARRIED. RARE ETOH. NO TOBACCO.   Social Determinants of Health   Financial Resource Strain: Medium Risk   Difficulty of Paying Living Expenses: Somewhat hard  Food Insecurity: No Food Insecurity   Worried About Charity fundraiser in the Last Year: Never true   Ran Out of Food in the Last Year: Never true  Transportation Needs: No Transportation Needs   Lack of Transportation (Medical): No   Lack of Transportation (Non-Medical): No  Physical Activity: Inactive   Days of Exercise per Week: 0 days   Minutes of Exercise per Session: 0 min  Stress: Stress Concern Present   Feeling of Stress : To some extent  Social Connections: Moderately Integrated   Frequency of Communication with Friends and Family: More than three times a week   Frequency of Social  Gatherings with Friends and Family: Once a week   Attends Religious Services: More than 4 times per year   Active Member of [employer] or Organizations: No   Attends Music therapist: Never   Marital Status: Married  Human resources officer Violence: Not At Risk   Fear of Current or Ex-Partner: No   Emotionally Abused: No   Physically Abused: No   Sexually Abused: No    FAMILY HISTORY:  Family History  Problem Relation Age of Onset   Heart disease Father    Cancer Maternal Aunt    Cancer Maternal Uncle    Cancer Paternal Uncle    Vision loss Maternal Grandfather    Colon cancer Neg Hx    Colon polyps Neg Hx     CURRENT MEDICATIONS:  Current Outpatient Medications  Medication Sig Dispense Refill   ALPRAZolam (XANAX) 0.25 MG tablet Take 1 tablet (0.25 mg total) by mouth 2 (two) times daily as needed for anxiety. 30 tablet 0   DOCEtaxel (TAXOTERE IV) Inject into the vein.     Gemcitabine HCl (GEMZAR IV) Inject into the vein.     HYDROcodone-homatropine (HYCODAN) 5-1.5 MG/5ML syrup  SMARTSIG:1 Teaspoon By Mouth Every 6 Hours PRN 480 mL 0   Oxycodone HCl 10 MG TABS Take 0.5 tablets (5 mg total) by mouth every 8 (eight) hours as needed. (Patient not taking: Reported on 08/11/2019) 30 tablet 0   No current facility-administered medications for this visit.    ALLERGIES:  No Known Allergies  PHYSICAL EXAM:  Performance status (ECOG): 1 - Symptomatic but completely ambulatory  There were no vitals filed for this visit. Wt Readings from Last 3 Encounters:  08/11/19 (!) 233 lb 14.5 oz (106.1 kg)  08/02/19 233 lb 9.6 oz (106 kg)  07/21/19 235 lb 3.2 oz (106.7 kg)   Physical Exam Vitals reviewed.  Constitutional:      Appearance: Normal appearance. He is obese.  Cardiovascular:     Rate and Rhythm: Normal rate and regular rhythm.     Pulses: Normal pulses.     Heart sounds: Normal heart sounds.  Pulmonary:     Effort: Pulmonary effort is normal.      Breath sounds: Normal breath sounds.  Chest:     Chest wall: No tenderness.     Comments: Port-a-Cath on R chest Neurological:     General: No focal deficit present.     Mental Status: He is alert and oriented to person, place, and time.  Psychiatric:        Mood and Affect: Mood normal.        Behavior: Behavior normal.     LABORATORY DATA:  I have reviewed the labs as listed.  CBC Latest Ref Rng & Units 08/11/2019 08/02/2019 07/04/2019  WBC 4.0 - 10.5 K/uL 8.0 6.8 5.6  Hemoglobin 13.0 - 17.0 g/dL 13.1 13.1 12.5(L)  Hematocrit 39 - 52 % 40.8 39.8 38.5(L)  Platelets 150 - 400 K/uL 191 248 419(H)   CMP Latest Ref Rng & Units 08/02/2019 07/04/2019 06/13/2019  Glucose 70 - 99 mg/dL 105(H) 117(H) 106(H)  BUN 6 - 20 mg/dL 13 10 9   Creatinine 0.61 - 1.24 mg/dL 0.72 0.70 0.74  Sodium 135 - 145 mmol/L 138 138 136  Potassium 3.5 - 5.1 mmol/L 3.9 3.7 4.1  Chloride 98 - 111 mmol/L 104 107 102  CO2 22 - 32 mmol/L 24 24 25   Calcium 8.9 - 10.3 mg/dL 9.2 8.7(L) 9.3  Total Protein 6.5 - 8.1 g/dL 7.2 7.0 7.6  Total Bilirubin 0.3 - 1.2 mg/dL 0.7 0.6 0.5  Alkaline Phos 38 - 126 U/L 105 111 123  AST 15 - 41 U/L 32 26 32  ALT 0 - 44 U/L 38 30 39   Lab Results  Component Value Date   LDH 157 08/02/2019   LDH 153 07/04/2019   LDH 181 06/13/2019     DIAGNOSTIC IMAGING:  I have independently reviewed the scans and discussed with the patient. CT CHEST W CONTRAST  Result Date: 07/20/2019 CLINICAL DATA:  Metastatic liposarcoma of the lower extremity. EXAM: CT CHEST, ABDOMEN, AND PELVIS WITH CONTRAST TECHNIQUE: Multidetector CT imaging of the chest, abdomen and pelvis was performed following the standard protocol during bolus administration of intravenous contrast. CONTRAST:  139m OMNIPAQUE IOHEXOL 300 MG/ML  SOLN COMPARISON:  04/24/2019 FINDINGS: CT CHEST FINDINGS Cardiovascular: Heart size upper normal. Coronary artery calcification is evident. Atherosclerotic calcification is noted in the wall of the  thoracic aorta. Right Port-A-Cath tip is positioned in the distal SVC. Mediastinum/Nodes: No mediastinal lymphadenopathy. There is no hilar lymphadenopathy. The esophagus has normal imaging features. There is no axillary lymphadenopathy. Lungs/Pleura: Bilateral pulmonary nodules  and masses are evident. Dominant cavitary lesion posterior left upper lobe measures 3.3 x 2.7 cm on image 36 of series 3 decreased from 4.7 x 4.2 cm when remeasured in a similar fashion on the prior study. A 2nd dominant lesion in the left parahilar upper lobe measures 3.1 x 3.1 cm on 78/3 today compared to 4.9 x 3.6 cm remeasured in a similar fashion on the previous exam. Nodular disease along the right major fissure is progressive in the interval. 5.4 x 3.1 cm right lower lobe perifissural nodule on 89/3 was 2.1 x 0.9 cm when remeasured on the prior study. Peripheral right middle lobe nodule measuring 2.7 x 2.1 cm on 92/3 today was 1.0 x 0.9 cm previously (remeasured). Small posteromedial left lower lobe nodule measuring 8 mm today (93/3) was 8 mm previously when remeasured. A linear band of soft tissue attenuation in the anterior right upper lobe (49/3) is at the site of a 1.3 x 1.1 cm nodule seen on the prior exam. Musculoskeletal: No worrisome lytic or sclerotic osseous abnormality. CT ABDOMEN PELVIS FINDINGS Hepatobiliary: No suspicious focal abnormality within the liver parenchyma. Gallbladder is surgically absent. No intrahepatic or extrahepatic biliary dilation. Pancreas: No focal mass lesion. No dilatation of the main duct. No intraparenchymal cyst. No peripancreatic edema. Spleen: No splenomegaly. No focal mass lesion. Adrenals/Urinary Tract: No adrenal nodule or mass. Kidneys unremarkable. 15 mm hypoattenuating lesion in the upper pole left kidney (best seen on delayed image 14 of series 8) is stable since prior. No evidence for hydroureter. The urinary bladder appears normal for the degree of distention. Stomach/Bowel: Stomach  is unremarkable. No gastric wall thickening. No evidence of outlet obstruction. Duodenum is normally positioned as is the ligament of Treitz. No small bowel wall thickening. No small bowel dilatation. The terminal ileum is normal. The appendix is normal. No gross colonic mass. No colonic wall thickening. Vascular/Lymphatic: There is abdominal aortic atherosclerosis without aneurysm. There is no gastrohepatic or hepatoduodenal ligament lymphadenopathy. No retroperitoneal or mesenteric lymphadenopathy. No pelvic sidewall lymphadenopathy. Reproductive: The prostate gland and seminal vesicles are unremarkable. Other: No intraperitoneal free fluid. Musculoskeletal: No worrisome lytic or sclerotic osseous abnormality. IMPRESSION: 1. Mixed response to therapy. The dominant metastatic lung lesions have decreased in size and an anterior right upper lobe 13 mm nodule seen previously has essentially resolved. Unfortunately, some nodules, including along the right major fissure, have progressed while still other pulmonary nodules are unchanged in the interval. 2. No evidence for metastatic disease in the abdomen or pelvis. 3. Stable 15 mm hypoattenuating lesion in the upper pole left kidney. This has attenuation too high to be a simple cyst and while likely a cyst complicated by proteinaceous debris or hemorrhage, neoplasm is not excluded. Close attention on follow-up recommended. 4. Aortic Atherosclerosis (ICD10-I70.0). Electronically Signed   By: Misty Stanley M.D.   On: 07/20/2019 09:28   CT ABDOMEN PELVIS W CONTRAST  Result Date: 07/20/2019 CLINICAL DATA:  Metastatic liposarcoma of the lower extremity. EXAM: CT CHEST, ABDOMEN, AND PELVIS WITH CONTRAST TECHNIQUE: Multidetector CT imaging of the chest, abdomen and pelvis was performed following the standard protocol during bolus administration of intravenous contrast. CONTRAST:  166m OMNIPAQUE IOHEXOL 300 MG/ML  SOLN COMPARISON:  04/24/2019 FINDINGS: CT CHEST FINDINGS  Cardiovascular: Heart size upper normal. Coronary artery calcification is evident. Atherosclerotic calcification is noted in the wall of the thoracic aorta. Right Port-A-Cath tip is positioned in the distal SVC. Mediastinum/Nodes: No mediastinal lymphadenopathy. There is no hilar lymphadenopathy. The esophagus has  normal imaging features. There is no axillary lymphadenopathy. Lungs/Pleura: Bilateral pulmonary nodules and masses are evident. Dominant cavitary lesion posterior left upper lobe measures 3.3 x 2.7 cm on image 36 of series 3 decreased from 4.7 x 4.2 cm when remeasured in a similar fashion on the prior study. A 2nd dominant lesion in the left parahilar upper lobe measures 3.1 x 3.1 cm on 78/3 today compared to 4.9 x 3.6 cm remeasured in a similar fashion on the previous exam. Nodular disease along the right major fissure is progressive in the interval. 5.4 x 3.1 cm right lower lobe perifissural nodule on 89/3 was 2.1 x 0.9 cm when remeasured on the prior study. Peripheral right middle lobe nodule measuring 2.7 x 2.1 cm on 92/3 today was 1.0 x 0.9 cm previously (remeasured). Small posteromedial left lower lobe nodule measuring 8 mm today (93/3) was 8 mm previously when remeasured. A linear band of soft tissue attenuation in the anterior right upper lobe (49/3) is at the site of a 1.3 x 1.1 cm nodule seen on the prior exam. Musculoskeletal: No worrisome lytic or sclerotic osseous abnormality. CT ABDOMEN PELVIS FINDINGS Hepatobiliary: No suspicious focal abnormality within the liver parenchyma. Gallbladder is surgically absent. No intrahepatic or extrahepatic biliary dilation. Pancreas: No focal mass lesion. No dilatation of the main duct. No intraparenchymal cyst. No peripancreatic edema. Spleen: No splenomegaly. No focal mass lesion. Adrenals/Urinary Tract: No adrenal nodule or mass. Kidneys unremarkable. 15 mm hypoattenuating lesion in the upper pole left kidney (best seen on delayed image 14 of series 8)  is stable since prior. No evidence for hydroureter. The urinary bladder appears normal for the degree of distention. Stomach/Bowel: Stomach is unremarkable. No gastric wall thickening. No evidence of outlet obstruction. Duodenum is normally positioned as is the ligament of Treitz. No small bowel wall thickening. No small bowel dilatation. The terminal ileum is normal. The appendix is normal. No gross colonic mass. No colonic wall thickening. Vascular/Lymphatic: There is abdominal aortic atherosclerosis without aneurysm. There is no gastrohepatic or hepatoduodenal ligament lymphadenopathy. No retroperitoneal or mesenteric lymphadenopathy. No pelvic sidewall lymphadenopathy. Reproductive: The prostate gland and seminal vesicles are unremarkable. Other: No intraperitoneal free fluid. Musculoskeletal: No worrisome lytic or sclerotic osseous abnormality. IMPRESSION: 1. Mixed response to therapy. The dominant metastatic lung lesions have decreased in size and an anterior right upper lobe 13 mm nodule seen previously has essentially resolved. Unfortunately, some nodules, including along the right major fissure, have progressed while still other pulmonary nodules are unchanged in the interval. 2. No evidence for metastatic disease in the abdomen or pelvis. 3. Stable 15 mm hypoattenuating lesion in the upper pole left kidney. This has attenuation too high to be a simple cyst and while likely a cyst complicated by proteinaceous debris or hemorrhage, neoplasm is not excluded. Close attention on follow-up recommended. 4. Aortic Atherosclerosis (ICD10-I70.0). Electronically Signed   By: Misty Stanley M.D.   On: 07/20/2019 09:28     ASSESSMENT:  1. 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 -Chemoradiation therapy with gemcitabine and docetaxel followed by wide local excision. -Pathology on 11/10/2018  showed liposarcoma with 80% necrosis, grade 2, ypT4, NX, positive deep margin, amplification of MDM 2 detected. Amplification is associated with well-differentiated liposarcoma/atypical lipomatous tumors and dedifferentiated liposarcoma. -CT CAP in the ER on 04/24/2019 showed widespread pulmonary metastatic lesions throughout both lungs, largest left upper lobe mass measuring 4.7 x 4.2 cm. Mass  in the inferior lingula measures 4.12 x 3.2 cm. -Bone scan on 05/02/2019 did not show any evidence of metastatic disease. -2D echo on 05/02/2019 shows EF 60 to 65%. -Adriamycin every 3 weeks started on 05/22/2019. -CT CAP on 07/19/2019 showed dominant lung lesions have decreased in size. 2 nodules in the right major fissure have progressed while some other pulmonary nodules are unchanged. No evidence of metastatic disease in the abdomen or pelvis. -Gemcitabine and docetaxel every 21 days started on 08/11/2019.  2. Left kidney lesion: -CT on 04/24/2019 showed mass arising in the periphery of the upper lobe of the left kidney measuring 1.3 x 1.4 cm. -CT scan from [facility] in August 2020 was showing left kidney lesion. -Current scan on 07/19/2019 showed 15 mm hypoattenuating lesion in the upper pole left kidney stable since prior.   PLAN:  1. Metastatic dedifferentiated liposarcoma: -I have talked to Dr. Angelina Ok at [facility] this Monday.  He does not have any clinical trials in this setting. -We will proceed with docetaxel and gemcitabine.  We will also give growth factor support on day 8.  We discussed the side effects in detail. -We will also send original tissue from [facility] for foundation 1 testing for [place] and [facility]. -RTC 1 week for day 8 of treatment.  2. Dry cough: -Continue hydrocodone syrup at bedtime which is helping.  3. Anxiety: -Continue Xanax 0.25 mg at bedtime as needed.  4.  Right-sided chest wall pain: -Continue oxycodone 10 mg half tablet 2-3 times a day.   Orders  placed this encounter:  No orders of the defined types were placed in this encounter.    [name], MD [place] [phone]   I, Milinda Antis, am acting as a scribe for Dr. Sanda Linger.  I, [name] MD, have reviewed the above documentation for accuracy and completeness, and I agree with the above.

## 2019-08-11 NOTE — Telephone Encounter (Signed)
Encounter opened in error

## 2019-08-11 NOTE — Progress Notes (Signed)
Added Neulasta OnPro to day 8 treatment plan per Dr. Delton Coombes.    Eddie Candle, PharmD PGY-2 Hematology/Oncology Pharmacy Resident

## 2019-08-11 NOTE — Patient Instructions (Addendum)
[place] at [facility] Discharge Instructions  You were seen and examined today by Dr. Delton Coombes. He spoke with you about your recent cough and complaints of pain. Dr. Delton Coombes encouraged you to take the pain medications prescribed to you as needed in addition to the cough medication.  Dr. Delton Coombes spoke with Dr. Myna Bright, who does not have any clinical trials at this time. Dr. Angelina Ok did recommend to continue the current treatment plan - chemotherapy. You will be on gemcitabine and docetaxel. The chemotherapy that you will be starting on are known to cause fatigue and decreased blood counts, as all chemotherapy does.  You will receive gemcitabine today, and both medications next Friday.   You will receive 3 cycles and then we will repeat scans.  Thank you for choosing [place] at [facility] to provide your oncology and hematology care.  To afford each patient quality time with our provider, please arrive at least 15 minutes before your scheduled appointment time.   If you have a lab appointment with the [facility] please come in thru the Main Entrance and check in at the main information desk.  You need to re-schedule your appointment should you arrive 10 or more minutes late.  We strive to give you quality time with our providers, and arriving late affects you and other patients whose appointments are after yours.  Also, if you no show three or more times for appointments you may be dismissed from the clinic at the providers discretion.     Again, thank you for choosing [facility].  Our hope is that these requests will decrease the amount of time that you wait before being seen by our physicians.       _____________________________________________________________  Should you have questions after your visit to [facility], please contact our office at [phone] and follow the prompts.  Our office hours are  8:00 a.m. and 4:30 p.m. Monday - Friday.  Please note that voicemails left after 4:00 p.m. may not be returned until the following business day.  We are closed weekends and major holidays.  You do have access to a nurse 24-7, just call the main number to the clinic [phone] and do not press any options, hold on the line and a nurse will answer the phone.    For prescription refill requests, have your pharmacy contact our office and allow 72 hours.    Due to Covid, you will need to wear a mask upon entering the hospital. If you do not have a mask, a mask will be given to you at the Main Entrance upon arrival. For doctor visits, patients may have 1 support person age 57 or older with them. For treatment visits, patients can not have anyone with them due to social distancing guidelines and our immunocompromised population.

## 2019-08-11 NOTE — Patient Instructions (Signed)
[name] Discharge Instructions for Patients Receiving Chemotherapy  Today you received the following chemotherapy agents   To help prevent nausea and vomiting after your treatment, we encourage you to take your nausea medication   If you develop nausea and vomiting that is not controlled by your nausea medication, call the clinic.   BELOW ARE SYMPTOMS THAT SHOULD BE REPORTED IMMEDIATELY:  *FEVER GREATER THAN 100.5 F  *CHILLS WITH OR WITHOUT FEVER  NAUSEA AND VOMITING THAT IS NOT CONTROLLED WITH YOUR NAUSEA MEDICATION  *UNUSUAL SHORTNESS OF BREATH  *UNUSUAL BRUISING OR BLEEDING  TENDERNESS IN MOUTH AND THROAT WITH OR WITHOUT PRESENCE OF ULCERS  *URINARY PROBLEMS  *BOWEL PROBLEMS  UNUSUAL RASH Items with * indicate a potential emergency and should be followed up as soon as possible.  Feel free to call the clinic should you have any questions or concerns. The clinic phone number is [phone].  Please show the CHEMO ALERT CARD at check-in to the Emergency Department and triage nurse.   

## 2019-08-14 ENCOUNTER — Telehealth (HOSPITAL_COMMUNITY): Payer: Self-pay

## 2019-08-14 NOTE — Telephone Encounter (Signed)
24 hour follow up-spoke with patient, he states he is doing pretty good, he had a few days over the weekend where he felt tired, body aches. He states he feels much better today. He is scheduled for Friday for his next infusion. Encouraged him to call if he has any further concerns or questions.

## 2019-08-18 ENCOUNTER — Inpatient Hospital Stay (HOSPITAL_COMMUNITY): Payer: 59 | Attending: Hematology

## 2019-08-18 ENCOUNTER — Other Ambulatory Visit: Payer: Self-pay

## 2019-08-18 ENCOUNTER — Inpatient Hospital Stay (HOSPITAL_COMMUNITY): Payer: 59

## 2019-08-18 ENCOUNTER — Inpatient Hospital Stay (HOSPITAL_BASED_OUTPATIENT_CLINIC_OR_DEPARTMENT_OTHER): Payer: 59 | Admitting: Nurse Practitioner

## 2019-08-18 VITALS — BP 141/87 | HR 76 | Temp 97.9°F | Resp 18

## 2019-08-18 DIAGNOSIS — N289 Disorder of kidney and ureter, unspecified: Secondary | ICD-10-CM | POA: Diagnosis not present

## 2019-08-18 DIAGNOSIS — R05 Cough: Secondary | ICD-10-CM | POA: Diagnosis not present

## 2019-08-18 DIAGNOSIS — R519 Headache, unspecified: Secondary | ICD-10-CM | POA: Diagnosis not present

## 2019-08-18 DIAGNOSIS — Z5111 Encounter for antineoplastic chemotherapy: Secondary | ICD-10-CM | POA: Diagnosis present

## 2019-08-18 DIAGNOSIS — R0789 Other chest pain: Secondary | ICD-10-CM | POA: Insufficient documentation

## 2019-08-18 DIAGNOSIS — C499 Malignant neoplasm of connective and soft tissue, unspecified: Secondary | ICD-10-CM | POA: Diagnosis not present

## 2019-08-18 DIAGNOSIS — R110 Nausea: Secondary | ICD-10-CM | POA: Diagnosis not present

## 2019-08-18 DIAGNOSIS — E860 Dehydration: Secondary | ICD-10-CM | POA: Diagnosis not present

## 2019-08-18 DIAGNOSIS — Z79899 Other long term (current) drug therapy: Secondary | ICD-10-CM | POA: Diagnosis not present

## 2019-08-18 DIAGNOSIS — Z9221 Personal history of antineoplastic chemotherapy: Secondary | ICD-10-CM | POA: Diagnosis not present

## 2019-08-18 DIAGNOSIS — Z5189 Encounter for other specified aftercare: Secondary | ICD-10-CM | POA: Diagnosis not present

## 2019-08-18 DIAGNOSIS — R748 Abnormal levels of other serum enzymes: Secondary | ICD-10-CM | POA: Insufficient documentation

## 2019-08-18 DIAGNOSIS — R918 Other nonspecific abnormal finding of lung field: Secondary | ICD-10-CM | POA: Diagnosis not present

## 2019-08-18 DIAGNOSIS — R739 Hyperglycemia, unspecified: Secondary | ICD-10-CM | POA: Insufficient documentation

## 2019-08-18 DIAGNOSIS — Z923 Personal history of irradiation: Secondary | ICD-10-CM | POA: Insufficient documentation

## 2019-08-18 DIAGNOSIS — R5383 Other fatigue: Secondary | ICD-10-CM | POA: Diagnosis not present

## 2019-08-18 DIAGNOSIS — C4922 Malignant neoplasm of connective and soft tissue of left lower limb, including hip: Secondary | ICD-10-CM

## 2019-08-18 DIAGNOSIS — R634 Abnormal weight loss: Secondary | ICD-10-CM | POA: Insufficient documentation

## 2019-08-18 DIAGNOSIS — R599 Enlarged lymph nodes, unspecified: Secondary | ICD-10-CM | POA: Insufficient documentation

## 2019-08-18 DIAGNOSIS — R0602 Shortness of breath: Secondary | ICD-10-CM | POA: Insufficient documentation

## 2019-08-18 DIAGNOSIS — Z87891 Personal history of nicotine dependence: Secondary | ICD-10-CM | POA: Diagnosis not present

## 2019-08-18 DIAGNOSIS — F419 Anxiety disorder, unspecified: Secondary | ICD-10-CM | POA: Diagnosis not present

## 2019-08-18 DIAGNOSIS — Z95828 Presence of other vascular implants and grafts: Secondary | ICD-10-CM

## 2019-08-18 DIAGNOSIS — M545 Low back pain: Secondary | ICD-10-CM | POA: Diagnosis not present

## 2019-08-18 LAB — CBC WITH DIFFERENTIAL/PLATELET
Abs Immature Granulocytes: 0.02 10*3/uL (ref 0.00–0.07)
Basophils Absolute: 0 10*3/uL (ref 0.0–0.1)
Basophils Relative: 1 %
Eosinophils Absolute: 0.7 10*3/uL — ABNORMAL HIGH (ref 0.0–0.5)
Eosinophils Relative: 18 %
HCT: 38.3 % — ABNORMAL LOW (ref 39.0–52.0)
Hemoglobin: 12.6 g/dL — ABNORMAL LOW (ref 13.0–17.0)
Immature Granulocytes: 1 %
Lymphocytes Relative: 23 %
Lymphs Abs: 0.9 10*3/uL (ref 0.7–4.0)
MCH: 30.4 pg (ref 26.0–34.0)
MCHC: 32.9 g/dL (ref 30.0–36.0)
MCV: 92.3 fL (ref 80.0–100.0)
Monocytes Absolute: 0.4 10*3/uL (ref 0.1–1.0)
Monocytes Relative: 11 %
Neutro Abs: 1.8 10*3/uL (ref 1.7–7.7)
Neutrophils Relative %: 46 %
Platelets: 127 10*3/uL — ABNORMAL LOW (ref 150–400)
RBC: 4.15 MIL/uL — ABNORMAL LOW (ref 4.22–5.81)
RDW: 15.3 % (ref 11.5–15.5)
WBC: 3.9 10*3/uL — ABNORMAL LOW (ref 4.0–10.5)
nRBC: 0 % (ref 0.0–0.2)

## 2019-08-18 LAB — COMPREHENSIVE METABOLIC PANEL
ALT: 89 U/L — ABNORMAL HIGH (ref 0–44)
AST: 73 U/L — ABNORMAL HIGH (ref 15–41)
Albumin: 3.8 g/dL (ref 3.5–5.0)
Alkaline Phosphatase: 112 U/L (ref 38–126)
Anion gap: 11 (ref 5–15)
BUN: 11 mg/dL (ref 6–20)
CO2: 24 mmol/L (ref 22–32)
Calcium: 9 mg/dL (ref 8.9–10.3)
Chloride: 104 mmol/L (ref 98–111)
Creatinine, Ser: 0.81 mg/dL (ref 0.61–1.24)
GFR calc Af Amer: >60 mL/min (ref >60)
GFR calc non Af Amer: >60 mL/min (ref >60)
Glucose, Bld: 118 mg/dL — ABNORMAL HIGH (ref 70–99)
Potassium: 3.7 mmol/L (ref 3.5–5.1)
Sodium: 139 mmol/L (ref 135–145)
Total Bilirubin: 0.8 mg/dL (ref 0.3–1.2)
Total Protein: 7.4 g/dL (ref 6.5–8.1)

## 2019-08-18 LAB — MAGNESIUM: Magnesium: 2 mg/dL (ref 1.7–2.4)

## 2019-08-18 LAB — LACTATE DEHYDROGENASE: LDH: 184 U/L (ref 98–192)

## 2019-08-18 MED ORDER — SODIUM CHLORIDE 0.9 % IV SOLN
675.0000 mg/m2 | Freq: Once | INTRAVENOUS | Status: AC
  Administered 2019-08-18: 1520 mg via INTRAVENOUS
  Filled 2019-08-18: qty 26.3

## 2019-08-18 MED ORDER — ALPRAZOLAM 0.25 MG PO TABS: 0.2500 mg | ORAL_TABLET | Freq: Two times a day (BID) | ORAL | 0 refills | Status: DC | PRN

## 2019-08-18 MED ORDER — SODIUM CHLORIDE 0.9 % IV SOLN
56.2500 mg/m2 | Freq: Once | INTRAVENOUS | Status: AC
  Administered 2019-08-18: 130 mg via INTRAVENOUS
  Filled 2019-08-18: qty 13

## 2019-08-18 MED ORDER — HEPARIN SOD (PORK) LOCK FLUSH 100 UNIT/ML IV SOLN
500.0000 [IU] | Freq: Once | INTRAVENOUS | Status: AC | PRN
  Administered 2019-08-18: 500 [IU]

## 2019-08-18 MED ORDER — PALONOSETRON HCL INJECTION 0.25 MG/5ML
0.2500 mg | Freq: Once | INTRAVENOUS | Status: AC
  Administered 2019-08-18: 0.25 mg via INTRAVENOUS
  Filled 2019-08-18: qty 5

## 2019-08-18 MED ORDER — SODIUM CHLORIDE 0.9% FLUSH
10.0000 mL | INTRAVENOUS | Status: DC | PRN
  Administered 2019-08-18: 10 mL

## 2019-08-18 MED ORDER — SODIUM CHLORIDE 0.9 % IV SOLN
10.0000 mg | Freq: Once | INTRAVENOUS | Status: AC
  Administered 2019-08-18: 10 mg via INTRAVENOUS
  Filled 2019-08-18: qty 10

## 2019-08-18 MED ORDER — SODIUM CHLORIDE 0.9 % IV SOLN: Freq: Once | INTRAVENOUS | Status: AC

## 2019-08-18 NOTE — Assessment & Plan Note (Addendum)
1.  Metastatic dedifferentiated liposarcoma: -CT chest showing multiple lung masses, largest in the posterior part of the left upper lobe.  Adenopathy in the mediastinum and hilum.  Left upper pole hypodense lesion concerning for renal cell carcinoma. -Bone scan on 05/02/2019 did not show any evidence of metastatic disease. -First cycle of Adriamycin 75 mg/m on 05/22/2019. -He felt weak with the first cycle with nausea.  He did report some heartburn which improved with Pepto-Bismol. -Denies any mucositis or hand-foot reaction on examination. -CT CAP on 07/19/2019 showed dominant lung lesions having decreased in size.  2 nodules in the right major fissure have progressed well some other pulmonary nodules are unchanged.  No evidence of metastatic disease in the abdomen or pelvis. -Gemcitabine and docetaxel every 21 days started on 08/11/2019. -Labs done on 08/18/2019 showed AST 73, ALT 89, creatinine 0.81, WBC 3.9, hemoglobin 12.6, platelets 127 -We we will decrease his gemcitabine by 25% and start his first docetaxel dose decreased by 25% -She will also come back on Monday to get Udyenca. -Patient reports he did great with his first cycle he denies any negative side effects. -We will see him back next week with labs.  2.  Dry cough: -Continue hydrocodone syrup which is helping with cough at nighttime.  3.  Anxiety: -Continue Xanax 0.25 milligrams 3 times a day as needed.  4.  Left kidney lesion: -CT scan on 04/24/2019 showed a mass arising in the periphery of the upper pole of the left kidney measuring 1.3 x 1.4 cm. -CT scan on 07/19/2019 showed 15 mm hypoattenuating lesion in the upper pole left kidney stable since prior study  5.  High risk drug monitoring: -2D echo on 05/02/2019 with a EF of 60 to 65%. -We will closely monitor with periodic echocardiograms.  6.  Right-sided chest wall pain: -Continue oxycodone 10 mg half tablet 2-3 times a day.

## 2019-08-18 NOTE — Progress Notes (Signed)
Labs reviewed with [name], LFTs are elevated.  Will dose reduced gemzar and taxotere.  Okay to proceed with treatment today.   Leonard [name] tolerated treatment well today without incidence. Discharged ambulatory with wife. Vital signs stable prior to discharge.

## 2019-08-18 NOTE — Addendum Note (Signed)
Addended by: Glennie Isle on: 08/18/2019 11:25 AM   Modules accepted: Orders

## 2019-08-18 NOTE — Patient Instructions (Signed)
[facility] Discharge Instructions for Patients Receiving Chemotherapy  Today you received the following chemotherapy agents   To help prevent nausea and vomiting after your treatment, we encourage you to take your nausea medication   If you develop nausea and vomiting that is not controlled by your nausea medication, call the clinic.   BELOW ARE SYMPTOMS THAT SHOULD BE REPORTED IMMEDIATELY:  *FEVER GREATER THAN 100.5 F  *CHILLS WITH OR WITHOUT FEVER  NAUSEA AND VOMITING THAT IS NOT CONTROLLED WITH YOUR NAUSEA MEDICATION  *UNUSUAL SHORTNESS OF BREATH  *UNUSUAL BRUISING OR BLEEDING  TENDERNESS IN MOUTH AND THROAT WITH OR WITHOUT PRESENCE OF ULCERS  *URINARY PROBLEMS  *BOWEL PROBLEMS  UNUSUAL RASH Items with * indicate a potential emergency and should be followed up as soon as possible.  Feel free to call the clinic should you have any questions or concerns. The clinic phone number is [phone].  Please show the CHEMO ALERT CARD at check-in to the Emergency Department and triage nurse.   

## 2019-08-18 NOTE — Progress Notes (Signed)
[facility] [address], [place] [address]   CLINIC:  Medical Oncology/Hematology  PCP:  Celene Squibb, MD [place] [address] [phone]   REASON FOR VISIT: Follow-up for liposarcoma of the left lower extremity   CURRENT THERAPY: Gemcitabine and docetaxel every 21 days.  BRIEF ONCOLOGIC HISTORY:  Oncology History  Liposarcoma of left lower extremity ([place])  04/25/2019 Initial Diagnosis   Liposarcoma of left lower extremity ([place])   05/22/2019 - 07/04/2019 Chemotherapy   The patient had DOXOrubicin (ADRIAMYCIN) chemo injection 174 mg, 75 mg/m2 = 174 mg, Intravenous,  Once, 3 of 6 cycles Administration: 174 mg (05/22/2019), 174 mg (06/13/2019), 174 mg (07/04/2019) palonosetron (ALOXI) injection 0.25 mg, 0.25 mg, Intravenous,  Once, 3 of 6 cycles Administration: 0.25 mg (05/22/2019), 0.25 mg (06/13/2019), 0.25 mg (07/04/2019) fosaprepitant (EMEND) 150 mg in sodium chloride 0.9 % 145 mL IVPB, 150 mg, Intravenous,  Once, 3 of 6 cycles Administration: 150 mg (05/22/2019), 150 mg (06/13/2019), 150 mg (07/04/2019)  for chemotherapy treatment.    08/11/2019 -  Chemotherapy   The patient had palonosetron (ALOXI) injection 0.25 mg, 0.25 mg, Intravenous,  Once, 1 of 4 cycles ondansetron (ZOFRAN) injection 8 mg, 8 mg (100 % of original dose 8 mg), Intravenous,  Once, 1 of 4 cycles Dose modification: 8 mg (original dose 8 mg, Cycle 1) Administration: 8 mg (08/11/2019) pegfilgrastim (NEULASTA ONPRO KIT) injection 6 mg, 6 mg, Subcutaneous, Once, 1 of 4 cycles pegfilgrastim-jmdb (FULPHILA) injection 6 mg, 6 mg, Subcutaneous,  Once, 1 of 4 cycles DOCEtaxel (TAXOTERE) 170 mg in sodium chloride 0.9 % 250 mL chemo infusion, 75 mg/m2 = 170 mg (75 % of original dose 100 mg/m2), Intravenous,  Once, 1 of 4 cycles Dose modification: 75 mg/m2 (75 % of original dose 100 mg/m2, Cycle 1, Reason: Provider Judgment) gemcitabine (GEMZAR) 2,052 mg in sodium chloride 0.9 % 250 mL  chemo infusion, 900 mg/m2 = 2,052 mg, Intravenous,  Once, 1 of 4 cycles Administration: 2,052 mg (08/11/2019)  for chemotherapy treatment.    Liposarcoma ([place])  05/15/2019 Initial Diagnosis   Liposarcoma ([place])   05/15/2019 Cancer Staging   Staging form: Primary Cutaneous B-Cell/T-Cell Lymphoma (Non-MF/SS Lymphoma), AJCC 8th Edition - Clinical stage from 05/15/2019: [name], pM1 - Signed by [name], MD on 05/15/2019     CANCER STAGING: Cancer Staging Liposarcoma [facility]) Staging form: Primary Cutaneous B-Cell/T-Cell Lymphoma (Non-MF/SS Lymphoma), AJCC 8th Edition - Clinical stage from 05/15/2019: [name], pM1 - Signed by [name], MD on 05/15/2019  Liposarcoma of left lower extremity ([place]) Staging form: Primary Cutaneous B-Cell/T-Cell Lymphoma (Non-MF/SS Lymphoma), AJCC 8th Edition - Clinical: No stage assigned - Unsigned    INTERVAL HISTORY:  Leonard [name] 59 y.o. male returns for routine follow-up for liposarcoma.  Patient reports he did well after his first cycle of his new regimen.  He denies any negative side effects.  He does still have the same cough and shortness of breath.  He does still have the same chest wall pain.  He does report some occasional dizziness and headaches from time to time. Denies any nausea, vomiting, or diarrhea. Denies any new pains. Had not noticed any recent bleeding such as epistaxis, hematuria or hematochezia. Denies recent chest pain on exertion, shortness of breath on minimal exertion, pre-syncopal episodes, or palpitations. Denies any numbness or tingling in hands or feet. Denies any recent fevers, infections, or recent hospitalizations. Patient reports appetite at 75% and energy level at 50%.  REVIEW OF SYSTEMS:  Review of Systems  Respiratory: Positive for cough and shortness of breath.   Cardiovascular: Positive for chest pain (Chest wall pain).  Neurological: Positive for dizziness and headaches.  All other systems reviewed and  are negative.    PAST MEDICAL/SURGICAL HISTORY:  Past Medical History:  Diagnosis Date  . Cancer ([place])   . Port-A-Cath in place 05/19/2019   Past Surgical History:  Procedure Laterality Date  . CHOLECYSTECTOMY    . ESOPHAGEAL DILATION N/A 06/27/2017   Procedure: ESOPHAGEAL DILATION;  Surgeon: Danie Binder, MD;  Location: [facility];  Service: Endoscopy;  Laterality: N/A;  . ESOPHAGOGASTRODUODENOSCOPY N/A 06/27/2017   Procedure: ESOPHAGOGASTRODUODENOSCOPY (EGD);  Surgeon: Danie Binder, MD;  Location: [facility];  Service: Endoscopy;  Laterality: N/A;  . IR IMAGING GUIDED PORT INSERTION  05/04/2019  . knee sx       SOCIAL HISTORY:  Social History   Socioeconomic History  . Marital status: Married    Spouse name: Not on file  . Number of children: 1  . Years of education: Not on file  . Highest education level: Not on file  Occupational History  . Occupation: Unemployed  Tobacco Use  . Smoking status: Former Research scientist (life sciences)  . Smokeless tobacco: Never Used  Vaping Use  . Vaping Use: Never used  Substance and Sexual Activity  . Alcohol use: Yes    Comment: 1-2 drinks per month  . Drug use: Never  . Sexual activity: Not Currently  Other Topics Concern  . Not on file  Social History Narrative   DOES CONSTRUCTION. MARRIED. RARE ETOH. NO TOBACCO.   Social Determinants of Health   Financial Resource Strain: Medium Risk  . Difficulty of Paying Living Expenses: Somewhat hard  Food Insecurity: No Food Insecurity  . Worried About Charity fundraiser in the Last Year: Never true  . Ran Out of Food in the Last Year: Never true  Transportation Needs: No Transportation Needs  . Lack of Transportation (Medical): No  . Lack of Transportation (Non-Medical): No  Physical Activity: Inactive  . Days of Exercise per Week: 0 days  . Minutes of Exercise per Session: 0 min  Stress: Stress Concern Present  . Feeling of Stress : To some extent  Social Connections: Moderately  Integrated  . Frequency of Communication with Friends and Family: More than three times a week  . Frequency of Social Gatherings with Friends and Family: Once a week  . Attends Religious Services: More than 4 times per year  . Active Member of Clubs or Organizations: No  . Attends Archivist Meetings: Never  . Marital Status: Married  Human resources officer Violence: Not At Risk  . Fear of Current or Ex-Partner: No  . Emotionally Abused: No  . Physically Abused: No  . Sexually Abused: No    FAMILY HISTORY:  Family History  Problem Relation Age of Onset  . Heart disease Father   . Cancer Maternal Aunt   . Cancer Maternal Uncle   . Cancer Paternal Uncle   . Vision loss Maternal Grandfather   . Colon cancer Neg Hx   . Colon polyps Neg Hx     CURRENT MEDICATIONS:  Outpatient Encounter Medications as of 08/18/2019  Medication Sig  . DOCEtaxel (TAXOTERE IV) Inject into the vein.  . Gemcitabine HCl (GEMZAR IV) Inject into the vein.  Marland Kitchen ALPRAZolam (XANAX) 0.25 MG tablet Take 1 tablet (0.25 mg total) by mouth 2 (two) times daily as  needed for anxiety. (Patient not taking: Reported on 08/18/2019)  . HYDROcodone-homatropine (HYCODAN) 5-1.5 MG/5ML syrup SMARTSIG:1 Teaspoon By Mouth Every 6 Hours PRN (Patient not taking: Reported on 08/18/2019)  . Oxycodone HCl 10 MG TABS Take 0.5 tablets (5 mg total) by mouth every 8 (eight) hours as needed. (Patient not taking: Reported on 08/11/2019)  . [DISCONTINUED] prochlorperazine (COMPAZINE) 10 MG tablet TAKE (1) TABLET BY MOUTH EVERY (6) HOURS AS NEEDED. (Patient not taking: Reported on 08/02/2019)   No facility-administered encounter medications on file as of 08/18/2019.    ALLERGIES:  No Known Allergies   PHYSICAL EXAM:  ECOG Performance status: 1  Vital signs: Blood pressure 131/85 Pulse 79 Respirations 18 Temp 97.9 O2 97% on room air   Physical Exam Constitutional:      Appearance: Normal appearance. He is normal weight.    Cardiovascular:     Rate and Rhythm: Normal rate and regular rhythm.     Heart sounds: Normal heart sounds.  Pulmonary:     Effort: Pulmonary effort is normal.     Breath sounds: Normal breath sounds.  Abdominal:     General: Bowel sounds are normal.     Palpations: Abdomen is soft.  Musculoskeletal:        General: Normal range of motion.  Skin:    General: Skin is warm.  Neurological:     Mental Status: He is alert and oriented to person, place, and time. Mental status is at baseline.  Psychiatric:        Mood and Affect: Mood normal.        Behavior: Behavior normal.        Thought Content: Thought content normal.        Judgment: Judgment normal.      LABORATORY DATA:  I have reviewed the labs as listed.  CBC    Component Value Date/Time   WBC 3.9 (L) 08/18/2019 0806   RBC 4.15 (L) 08/18/2019 0806   HGB 12.6 (L) 08/18/2019 0806   HCT 38.3 (L) 08/18/2019 0806   PLT 127 (L) 08/18/2019 0806   MCV 92.3 08/18/2019 0806   MCH 30.4 08/18/2019 0806   MCHC 32.9 08/18/2019 0806   RDW 15.3 08/18/2019 0806   LYMPHSABS 0.9 08/18/2019 0806   MONOABS 0.4 08/18/2019 0806   EOSABS 0.7 (H) 08/18/2019 0806   BASOSABS 0.0 08/18/2019 0806   CMP Latest Ref Rng & Units 08/18/2019 08/11/2019 08/02/2019  Glucose 70 - 99 mg/dL 118(H) 153(H) 105(H)  BUN 6 - 20 mg/dL 11 15 13   Creatinine 0.61 - 1.24 mg/dL 0.81 0.86 0.72  Sodium 135 - 145 mmol/L 139 137 138  Potassium 3.5 - 5.1 mmol/L 3.7 3.5 3.9  Chloride 98 - 111 mmol/L 104 104 104  CO2 22 - 32 mmol/L 24 23 24   Calcium 8.9 - 10.3 mg/dL 9.0 8.9 9.2  Total Protein 6.5 - 8.1 g/dL 7.4 7.2 7.2  Total Bilirubin 0.3 - 1.2 mg/dL 0.8 0.5 0.7  Alkaline Phos 38 - 126 U/L 112 117 105  AST 15 - 41 U/L 73(H) 31 32  ALT 0 - 44 U/L 89(H) 35 38    All questions were answered to patient's stated satisfaction. Encouraged patient to call with any new concerns or questions before his next visit to the cancer center and we can certain see him sooner, if  needed.     ASSESSMENT & PLAN:  Liposarcoma ([place]) 1.  Metastatic dedifferentiated liposarcoma: -CT chest showing multiple lung masses, largest in the  posterior part of the left upper lobe.  Adenopathy in the mediastinum and hilum.  Left upper pole hypodense lesion concerning for renal cell carcinoma. -Bone scan on 05/02/2019 did not show any evidence of metastatic disease. -First cycle of Adriamycin 75 mg/m on 05/22/2019. -He felt weak with the first cycle with nausea.  He did report some heartburn which improved with Pepto-Bismol. -Denies any mucositis or hand-foot reaction on examination. -CT CAP on 07/19/2019 showed dominant lung lesions having decreased in size.  2 nodules in the right major fissure have progressed well some other pulmonary nodules are unchanged.  No evidence of metastatic disease in the abdomen or pelvis. -Gemcitabine and docetaxel every 21 days started on 08/11/2019. -Labs done on 08/18/2019 showed AST 73, ALT 89, creatinine 0.81, WBC 3.9, hemoglobin 12.6, platelets 127 -We we will decrease his gemcitabine by 25% and start his first docetaxel dose decreased by 25% -She will also come back on Monday to get Udyenca. -Patient reports he did great with his first cycle he denies any negative side effects. -We will see him back next week with labs.  2.  Dry cough: -Continue hydrocodone syrup which is helping with cough at nighttime.  3.  Anxiety: -Continue Xanax 0.25 milligrams 3 times a day as needed.  4.  Left kidney lesion: -CT scan on 04/24/2019 showed a mass arising in the periphery of the upper pole of the left kidney measuring 1.3 x 1.4 cm. -CT scan on 07/19/2019 showed 15 mm hypoattenuating lesion in the upper pole left kidney stable since prior study  5.  High risk drug monitoring: -2D echo on 05/02/2019 with a EF of 60 to 65%. -We will closely monitor with periodic echocardiograms.  6.  Right-sided chest wall pain: -Continue oxycodone 10 mg half tablet 2-3 times a  day.     Orders placed this encounter:  Orders Placed This Encounter  Procedures  . Lactate dehydrogenase  . Magnesium  . CBC with Differential/Platelet  . Comprehensive metabolic panel      Leonard Finders, FNP-C [place] [phone]

## 2019-08-21 ENCOUNTER — Other Ambulatory Visit: Payer: Self-pay

## 2019-08-21 ENCOUNTER — Inpatient Hospital Stay (HOSPITAL_COMMUNITY): Payer: 59

## 2019-08-21 VITALS — BP 108/86 | HR 96 | Temp 97.1°F | Resp 22

## 2019-08-21 DIAGNOSIS — C4922 Malignant neoplasm of connective and soft tissue of left lower limb, including hip: Secondary | ICD-10-CM

## 2019-08-21 DIAGNOSIS — Z5111 Encounter for antineoplastic chemotherapy: Secondary | ICD-10-CM | POA: Diagnosis not present

## 2019-08-21 DIAGNOSIS — E860 Dehydration: Secondary | ICD-10-CM

## 2019-08-21 DIAGNOSIS — C499 Malignant neoplasm of connective and soft tissue, unspecified: Secondary | ICD-10-CM

## 2019-08-21 DIAGNOSIS — Z95828 Presence of other vascular implants and grafts: Secondary | ICD-10-CM

## 2019-08-21 MED ORDER — SODIUM CHLORIDE 0.9% FLUSH
10.0000 mL | INTRAVENOUS | Status: AC | PRN
  Administered 2019-08-21: 10 mL via INTRAVENOUS

## 2019-08-21 MED ORDER — SODIUM CHLORIDE 0.9 % IV SOLN
Freq: Once | INTRAVENOUS | Status: AC
  Filled 2019-08-21: qty 1000

## 2019-08-21 MED ORDER — HEPARIN SOD (PORK) LOCK FLUSH 100 UNIT/ML IV SOLN
500.0000 [IU] | Freq: Once | INTRAVENOUS | Status: AC
  Administered 2019-08-21: 500 [IU] via INTRAVENOUS

## 2019-08-21 MED ORDER — PEGFILGRASTIM INJECTION 6 MG/0.6ML ~~LOC~~
6.0000 mg | PREFILLED_SYRINGE | Freq: Once | SUBCUTANEOUS | Status: AC
  Administered 2019-08-21: 6 mg via SUBCUTANEOUS
  Filled 2019-08-21: qty 0.6

## 2019-08-21 NOTE — Progress Notes (Unsigned)
Pt came in today for Neulasta injection. Pt stated that he has been experiencing headaches, no energy and no appetite. The pt stated that he " feels terrible". I spoke with Dr. Delton Coombes and he gave a verbal order for 1 liter of fluids with electrolytes over 2 hours.    Pt received fluids per orders and tolerated well with no complaints. Pt stable and discharged home ambulatory with his wife. Pt to return as scheduled for follow up. Pt and wife were advised to push fluids and to contact the clinic if there are any changes.

## 2019-08-21 NOTE — Progress Notes (Unsigned)
Patient presented for Nuelasta injection. Patient had injection in left arm. Patient tolerated injection well with no complaints. Patient to return as scheduled for treatment and follow up.

## 2019-08-25 ENCOUNTER — Inpatient Hospital Stay (HOSPITAL_COMMUNITY): Payer: 59

## 2019-08-25 ENCOUNTER — Other Ambulatory Visit (HOSPITAL_COMMUNITY): Payer: Self-pay

## 2019-08-25 ENCOUNTER — Other Ambulatory Visit: Payer: Self-pay

## 2019-08-25 ENCOUNTER — Inpatient Hospital Stay (HOSPITAL_BASED_OUTPATIENT_CLINIC_OR_DEPARTMENT_OTHER): Payer: 59 | Admitting: Hematology

## 2019-08-25 VITALS — BP 110/64 | HR 94 | Temp 96.9°F | Resp 20 | Wt 230.8 lb

## 2019-08-25 VITALS — BP 115/64 | HR 80 | Temp 97.2°F | Resp 18

## 2019-08-25 DIAGNOSIS — C499 Malignant neoplasm of connective and soft tissue, unspecified: Secondary | ICD-10-CM

## 2019-08-25 DIAGNOSIS — C4922 Malignant neoplasm of connective and soft tissue of left lower limb, including hip: Secondary | ICD-10-CM

## 2019-08-25 DIAGNOSIS — E860 Dehydration: Secondary | ICD-10-CM

## 2019-08-25 DIAGNOSIS — Z5111 Encounter for antineoplastic chemotherapy: Secondary | ICD-10-CM | POA: Diagnosis not present

## 2019-08-25 LAB — COMPREHENSIVE METABOLIC PANEL
ALT: 60 U/L — ABNORMAL HIGH (ref 0–44)
AST: 34 U/L (ref 15–41)
Albumin: 3.7 g/dL (ref 3.5–5.0)
Alkaline Phosphatase: 135 U/L — ABNORMAL HIGH (ref 38–126)
Anion gap: 13 (ref 5–15)
BUN: 14 mg/dL (ref 6–20)
CO2: 22 mmol/L (ref 22–32)
Calcium: 8.8 mg/dL — ABNORMAL LOW (ref 8.9–10.3)
Chloride: 98 mmol/L (ref 98–111)
Creatinine, Ser: 0.99 mg/dL (ref 0.61–1.24)
GFR calc Af Amer: >60 mL/min (ref >60)
GFR calc non Af Amer: >60 mL/min (ref >60)
Glucose, Bld: 108 mg/dL — ABNORMAL HIGH (ref 70–99)
Potassium: 3.7 mmol/L (ref 3.5–5.1)
Sodium: 133 mmol/L — ABNORMAL LOW (ref 135–145)
Total Bilirubin: 1.2 mg/dL (ref 0.3–1.2)
Total Protein: 7.1 g/dL (ref 6.5–8.1)

## 2019-08-25 LAB — CBC WITH DIFFERENTIAL/PLATELET
Abs Immature Granulocytes: 1.9 10*3/uL — ABNORMAL HIGH (ref 0.00–0.07)
Basophils Absolute: 0.1 10*3/uL (ref 0.0–0.1)
Basophils Relative: 1 %
Eosinophils Absolute: 0.4 10*3/uL (ref 0.0–0.5)
Eosinophils Relative: 4 %
HCT: 36.4 % — ABNORMAL LOW (ref 39.0–52.0)
Hemoglobin: 12 g/dL — ABNORMAL LOW (ref 13.0–17.0)
Immature Granulocytes: 19 %
Lymphocytes Relative: 8 %
Lymphs Abs: 0.9 10*3/uL (ref 0.7–4.0)
MCH: 30.5 pg (ref 26.0–34.0)
MCHC: 33 g/dL (ref 30.0–36.0)
MCV: 92.4 fL (ref 80.0–100.0)
Monocytes Absolute: 1.9 10*3/uL — ABNORMAL HIGH (ref 0.1–1.0)
Monocytes Relative: 19 %
Neutro Abs: 5 10*3/uL (ref 1.7–7.7)
Neutrophils Relative %: 49 %
Platelets: 86 10*3/uL — ABNORMAL LOW (ref 150–400)
RBC: 3.94 MIL/uL — ABNORMAL LOW (ref 4.22–5.81)
RDW: 15.5 % (ref 11.5–15.5)
WBC: 10.1 10*3/uL (ref 4.0–10.5)
nRBC: 1.5 % — ABNORMAL HIGH (ref 0.0–0.2)

## 2019-08-25 LAB — MAGNESIUM: Magnesium: 1.9 mg/dL (ref 1.7–2.4)

## 2019-08-25 LAB — LACTATE DEHYDROGENASE: LDH: 394 U/L — ABNORMAL HIGH (ref 98–192)

## 2019-08-25 MED ORDER — HEPARIN SOD (PORK) LOCK FLUSH 100 UNIT/ML IV SOLN
500.0000 [IU] | Freq: Once | INTRAVENOUS | Status: AC
  Administered 2019-08-25: 500 [IU] via INTRAVENOUS

## 2019-08-25 MED ORDER — ONDANSETRON HCL 4 MG/2ML IJ SOLN: 4.0000 mg | Freq: Once | INTRAMUSCULAR | Status: DC

## 2019-08-25 MED ORDER — SODIUM CHLORIDE 0.9 % IV SOLN
Freq: Once | INTRAVENOUS | Status: AC
  Filled 2019-08-25: qty 1000

## 2019-08-25 MED ORDER — ONDANSETRON HCL 4 MG/2ML IJ SOLN
4.0000 mg | Freq: Once | INTRAMUSCULAR | Status: AC
  Administered 2019-08-25: 4 mg via INTRAVENOUS
  Filled 2019-08-25: qty 2

## 2019-08-25 NOTE — Progress Notes (Signed)
Patient tolerated hydration with no complaints voiced.  Port site clean and dry with good blood return noted before and after hydration.  No bruising or swelling noted with port.  Band aid applied.  VSS with discharge and left in satisfactory condition with no s/s of distress noted.

## 2019-08-25 NOTE — Progress Notes (Signed)
Leonard [name], Leonard [name] [address]   CLINIC:  Medical Oncology/Hematology  PCP:  Leonard Squibb, MD [address] Leonard [name] [name] [address] [phone]   REASON FOR VISIT:  Follow-up for metastatic dedifferentiated liposarcoma  PRIOR THERAPY: Gemcitabine and docetaxel followed by wide local excision on 11/10/2018  NGS Results: Pending  CURRENT THERAPY: Gemcitabine and docetaxel  BRIEF ONCOLOGIC HISTORY:  Oncology History  Liposarcoma of left lower extremity (Leonard [name])  04/25/2019 Initial Diagnosis   Liposarcoma of left lower extremity ([place])   05/22/2019 - 07/04/2019 Chemotherapy   The patient had DOXOrubicin (ADRIAMYCIN) chemo injection 174 mg, 75 mg/m2 = 174 mg, Intravenous,  Once, 3 of 6 cycles Administration: 174 mg (05/22/2019), 174 mg (06/13/2019), 174 mg (07/04/2019) palonosetron (ALOXI) injection 0.25 mg, 0.25 mg, Intravenous,  Once, 3 of 6 cycles Administration: 0.25 mg (05/22/2019), 0.25 mg (06/13/2019), 0.25 mg (07/04/2019) fosaprepitant (EMEND) 150 mg in sodium chloride 0.9 % 145 mL IVPB, 150 mg, Intravenous,  Once, 3 of 6 cycles Administration: 150 mg (05/22/2019), 150 mg (06/13/2019), 150 mg (07/04/2019)  for chemotherapy treatment.    08/11/2019 -  Chemotherapy   The patient had palonosetron (ALOXI) injection 0.25 mg, 0.25 mg, Intravenous,  Once, 1 of 4 cycles Administration: 0.25 mg (08/18/2019) ondansetron (ZOFRAN) injection 8 mg, 8 mg (100 % of original dose 8 mg), Intravenous,  Once, 1 of 4 cycles Dose modification: 8 mg (original dose 8 mg, Cycle 1) Administration: 8 mg (08/11/2019) pegfilgrastim (NEULASTA) injection 6 mg, 6 mg, Subcutaneous, Once, 1 of 1 cycle pegfilgrastim (NEULASTA ONPRO KIT) injection 6 mg, 6 mg, Subcutaneous, Once, 1 of 4 cycles DOCEtaxel (TAXOTERE) 130 mg in sodium chloride 0.9 % 250 mL chemo infusion, 56.25 mg/m2 = 170 mg (75 % of original dose 100 mg/m2), Intravenous,  Once, 1 of 4 cycles Dose modification: 75 mg/m2  (75 % of original dose 100 mg/m2, Cycle 1, Reason: Provider Judgment) Administration: 130 mg (08/18/2019) gemcitabine (GEMZAR) 2,052 mg in sodium chloride 0.9 % 250 mL chemo infusion, 900 mg/m2 = 2,052 mg, Intravenous,  Once, 1 of 4 cycles Administration: 2,052 mg (08/11/2019), 1,520 mg (08/18/2019)  for chemotherapy treatment.    Liposarcoma (Leonard [name])  05/15/2019 Initial Diagnosis   Liposarcoma ([place])   05/15/2019 Cancer Staging   Staging form: Primary Cutaneous B-Cell/T-Cell Lymphoma (Non-MF/SS Lymphoma), AJCC 8th Edition - Clinical stage from 05/15/2019: Leonard [name], pM1 - Signed by Leonard Jack, MD on 05/15/2019     CANCER STAGING: Cancer Staging Liposarcoma [facility]) Staging form: Primary Cutaneous B-Cell/T-Cell Lymphoma (Non-MF/SS Lymphoma), AJCC 8th Edition - Clinical stage from 05/15/2019: Leonard [name], pM1 - Signed by Leonard Jack, MD on 05/15/2019  Liposarcoma of left lower extremity (Leonard [name]) Staging form: Primary Cutaneous B-Cell/T-Cell Lymphoma (Non-MF/SS Lymphoma), AJCC 8th Edition - Clinical: No stage assigned - Unsigned   INTERVAL HISTORY:  [name], a 59 y.o. male, returns for routine follow-up of his metastatic dedifferentiated liposarcoma. Leonard [name] was last seen on 08/11/2019.   Today he reports that he hurt his lower back yesterday and had to take oxycodone; he took half a tablet for the pain this morning. The pain is staying in his back and not radiating into his legs. This morning he feels horrible. He is drinking 3-4 bottles of water daily and he has some nausea and a couple episodes of diarrhea earlier in the week. His appetite has not been good. He denies hemoptysis and his cough is stable.  REVIEW OF SYSTEMS:  Review  of Systems  Constitutional: Positive for appetite change (severely decreased) and fatigue (depleted).  Respiratory: Positive for cough (stable) and shortness of breath. Negative for hemoptysis.   Gastrointestinal: Positive for diarrhea and nausea.   Musculoskeletal: Positive for back pain (4/10 back and chest pain).  Neurological: Positive for headaches.  All other systems reviewed and are negative.   PAST MEDICAL/SURGICAL HISTORY:  Past Medical History:  Diagnosis Date  . Cancer ([place])   . Port-A-Cath in place 05/19/2019   Past Surgical History:  Procedure Laterality Date  . CHOLECYSTECTOMY    . ESOPHAGEAL DILATION N/A 06/27/2017   Procedure: ESOPHAGEAL DILATION;  Surgeon: Leonard Binder, MD;  [facility];  Service: Endoscopy;  Laterality: N/A;  . ESOPHAGOGASTRODUODENOSCOPY N/A 06/27/2017   Procedure: ESOPHAGOGASTRODUODENOSCOPY (EGD);  Surgeon: Leonard Binder, MD;  Location: [facility] END[facility];  Service: Endoscopy;  Laterality: N/A;  . IR IMAGING GUIDED PORT INSERTION  05/04/2019  . knee sx      SOCIAL HISTORY:  Social History   Socioeconomic History  . Marital status: Married    Spouse name: Not on file  . Number of children: 1  . Years of education: Not on file  . Highest education level: Not on file  Occupational History  . Occupation: Unemployed  Tobacco Use  . Smoking status: Former Research scientist (life sciences)  . Smokeless tobacco: Never Used  Vaping Use  . Vaping Use: Never used  Substance and Sexual Activity  . Alcohol use: Yes    Comment: 1-2 drinks per month  . Drug use: Never  . Sexual activity: Not Currently  Other Topics Concern  . Not on file  Social History Narrative   DOES CONSTRUCTION. MARRIED. RARE ETOH. NO TOBACCO.   Social Determinants of Health   Financial Resource Strain: Medium Risk  . Difficulty of Paying Living Expenses: Somewhat hard  Food Insecurity: No Food Insecurity  . Worried About Charity fundraiser in the Last Year: Never true  . Ran Out of Food in the Last Year: Never true  Transportation Needs: No Transportation Needs  . Lack of Transportation (Medical): No  . Lack of Transportation (Non-Medical): No  Physical Activity: Inactive  . Days of Exercise per Week: 0 days  . Minutes of  Exercise per Session: 0 min  Stress: Stress Concern Present  . Feeling of Stress : To some extent  Social Connections: Moderately Integrated  . Frequency of Communication with Friends and Family: More than three times a week  . Frequency of Social Gatherings with Friends and Family: Once a week  . Attends Religious Services: More than 4 times per year  . Active Member of Clubs or Organizations: No  . Attends Archivist Meetings: Never  . Marital Status: Married  Human resources officer Violence: Not At Risk  . Fear of Current or Ex-Partner: No  . Emotionally Abused: No  . Physically Abused: No  . Sexually Abused: No    FAMILY HISTORY:  Family History  Problem Relation Age of Onset  . Heart disease Father   . Cancer Maternal Aunt   . Cancer Maternal Uncle   . Cancer Paternal Uncle   . Vision loss Maternal Grandfather   . Colon cancer Neg Hx   . Colon polyps Neg Hx     CURRENT MEDICATIONS:  Current Outpatient Medications  Medication Sig Dispense Refill  . ALPRAZolam (XANAX) 0.25 MG tablet Take 1 tablet (0.25 mg total) by mouth 2 (two) times daily as needed for anxiety. 60 tablet  0  . DOCEtaxel (TAXOTERE IV) Inject into the vein.    . Gemcitabine HCl (GEMZAR IV) Inject into the vein.    Marland Kitchen HYDROcodone-homatropine (HYCODAN) 5-1.5 MG/5ML syrup SMARTSIG:1 Teaspoon By Mouth Every 6 Hours PRN 480 mL 0  . Oxycodone HCl 10 MG TABS Take 0.5 tablets (5 mg total) by mouth every 8 (eight) hours as needed. 30 tablet 0   No current facility-administered medications for this visit.   Facility-Administered Medications Ordered in Other Visits  Medication Dose Route Frequency Provider Last Rate Last Admin  . ondansetron (ZOFRAN) injection 4 mg  4 mg Intravenous Once Leonard Jack, MD      . sodium chloride flush (NS) 0.9 % injection 10 mL  10 mL Intravenous PRN Leonard Jack, MD   10 mL at 08/21/19 1508    ALLERGIES:  No Known Allergies  PHYSICAL EXAM:  Performance  status (ECOG): 1 - Symptomatic but completely ambulatory  Vitals:   08/25/19 1000  BP: 110/64  Pulse: 94  Resp: 20  Temp: (!) 96.9 F (36.1 C)  SpO2: 95%   Wt Readings from Last 3 Encounters:  08/25/19 230 lb 12.8 oz (104.7 kg)  08/11/19 (!) 233 lb 14.5 oz (106.1 kg)  08/02/19 233 lb 9.6 oz (106 kg)   Physical Exam Vitals reviewed.  Constitutional:      Appearance: Normal appearance. He is obese.  Cardiovascular:     Rate and Rhythm: Normal rate and regular rhythm.     Pulses: Normal pulses.     Heart sounds: Normal heart sounds.  Pulmonary:     Effort: Pulmonary effort is normal.     Breath sounds: Normal breath sounds.  Musculoskeletal:     Lumbar back: No tenderness or bony tenderness.     Right lower leg: No edema.     Left lower leg: No edema.  Neurological:     General: No focal deficit present.     Mental Status: He is alert and oriented to person, place, and time.  Psychiatric:        Mood and Affect: Mood normal.        Behavior: Behavior normal.      LABORATORY DATA:  I have reviewed the labs as listed.  CBC Latest Ref Rng & Units 08/25/2019 08/18/2019 08/11/2019  WBC 4.0 - 10.5 K/uL 10.1 3.9(L) 8.0  Hemoglobin 13.0 - 17.0 g/dL 12.0(L) 12.6(L) 13.1  Hematocrit 39 - 52 % 36.4(L) 38.3(L) 40.8  Platelets 150 - 400 K/uL 86(L) 127(L) 191   CMP Latest Ref Rng & Units 08/25/2019 08/18/2019 08/11/2019  Glucose 70 - 99 mg/dL 108(H) 118(H) 153(H)  BUN 6 - 20 mg/dL _0 Creatinine 0.61 - 1.24 mg/dL 0.99 0.81 0.86  Sodium 135 - 145 mmol/L 133(L) 139 137  Potassium 3.5 - 5.1 mmol/L 3.7 3.7 3.5  Chloride 98 - 111 mmol/L 98 104 104  CO2 22 - 32 mmol/L _1 Calcium 8.9 - 10.3 mg/dL 8.8(L) 9.0 8.9  Total Protein 6.5 - 8.1 g/dL 7.1 7.4 7.2  Total Bilirubin 0.3 - 1.2 mg/dL 1.2 0.8 0.5  Alkaline Phos 38 - 126 U/L 135(H) 112 117  AST 15 - 41 U/L 34 73(H) 31  ALT 0 - 44 U/L 60(H) 89(H) 35    DIAGNOSTIC IMAGING:  I have independently reviewed the scans and  discussed with the patient. No results found.   ASSESSMENT:  1. Metastatic dedifferentiated liposarcoma: -Left calf mass biopsy on 08/09/2018 consistent with grade 2  liposarcoma, CHOP gene rearrangement showed gene not disrupted. -Chemoradiation therapy with gemcitabine and docetaxel followed by wide local excision. -Pathology on 11/10/2018 showed liposarcoma with 80% necrosis, grade 2, ypT4, NX, positive deep margin, amplification of MDM 2 detected. Amplification is associated with well-differentiated liposarcoma/atypical lipomatous tumors and dedifferentiated liposarcoma. -CT CAP in the ER on 04/24/2019 showed widespread pulmonary metastatic lesions throughout both lungs, largest left upper lobe mass measuring 4.7 x 4.2 cm. Mass in the inferior lingula measures 4.12 x 3.2 cm. -Bone scan on 05/02/2019 did not show any evidence of metastatic disease. -2D echo on 05/02/2019 shows EF 60 to 65%. -Adriamycin every 3 weeks started on 05/22/2019. -CT CAP on 07/19/2019 showed dominant lung lesions have decreased in size. 2 nodules in the right major fissure have progressed while some other pulmonary nodules are unchanged. No evidence of metastatic disease in the abdomen or pelvis. -Gemcitabine and docetaxel every 21 days started on 08/11/2019.  2. Left kidney lesion: -CT on 04/24/2019 showed mass arising in the periphery of the upper lobe of the left kidney measuring 1.3 x 1.4 cm. -CT scan from [facility] in August 2020 was showing left kidney lesion. -Current scan on 07/19/2019 showed 15 mm hypoattenuating lesion in the upper pole left kidney stable since prior.   PLAN:  1. Metastatic dedifferentiated liposarcoma: -He completed day 8 last week.  He had minor diarrhea and nausea. -I have reviewed labs today.  AST has normalized.  ALT improved to 60 from 89 last week. -CBC showed platelet count of 86 with normal white count.  He also received Neulasta. -He is dehydrated.  He will receive  fluids with electrolytes today. -I have told him to take Imodium as needed for diarrhea.  2. Dry cough: -Continue hydrocodone syrup at bedtime as needed for dry cough.  3. Anxiety: -Continue Xanax 0.25 mg at bedtime as needed.  4. Right-sided chest wall pain: -Continue oxycodone 10 mg half tablet 2-3 times a day. -He also has low back pain which has gotten slightly worse.  Oxycodone helps.   Orders placed this encounter:  No orders of the defined types were placed in this encounter.    Leonard Jack, MD [place] [phone]   I, Milinda Antis, am acting as a scribe for Dr. Sanda Linger.  I, Leonard Jack MD, have reviewed the above documentation for accuracy and completeness, and I agree with the above.

## 2019-08-25 NOTE — Patient Instructions (Signed)
[place] at [facility] Discharge Instructions  You were seen and examined today by Dr.Katragadda.   We will give you fluids and some nausea medicine before you leave the clinic today.   Thank you for choosing [place] at [facility] to provide your oncology and hematology care.  To afford each patient quality time with our provider, please arrive at least 15 minutes before your scheduled appointment time.   If you have a lab appointment with the [place] please come in thru the Main Entrance and check in at the main information desk.  You need to re-schedule your appointment should you arrive 10 or more minutes late.  We strive to give you quality time with our providers, and arriving late affects you and other patients whose appointments are after yours.  Also, if you no show three or more times for appointments you may be dismissed from the clinic at the providers discretion.     Again, thank you for choosing [facility].  Our hope is that these requests will decrease the amount of time that you wait before being seen by our physicians.       _____________________________________________________________  Should you have questions after your visit to [facility], please contact our office at [phone] and follow the prompts.  Our office hours are 8:00 a.m. and 4:30 p.m. Monday - Friday.  Please note that voicemails left after 4:00 p.m. may not be returned until the following business day.  We are closed weekends and major holidays.  You do have access to a nurse 24-7, just call the main number to the clinic [phone] and do not press any options, hold on the line and a nurse will answer the phone.    For prescription refill requests, have your pharmacy contact our office and allow 72 hours.    Due to Covid, you will need to wear a mask upon entering the hospital. If you do not have a mask, a mask will be  given to you at the Main Entrance upon arrival. For doctor visits, patients may have 1 support person age 25 or older with them. For treatment visits, patients can not have anyone with them due to social distancing guidelines and our immunocompromised population.

## 2019-08-31 ENCOUNTER — Encounter (HOSPITAL_COMMUNITY): Payer: Self-pay | Admitting: Hematology

## 2019-08-31 ENCOUNTER — Other Ambulatory Visit: Payer: Self-pay

## 2019-08-31 ENCOUNTER — Inpatient Hospital Stay (HOSPITAL_COMMUNITY): Payer: 59

## 2019-08-31 ENCOUNTER — Inpatient Hospital Stay (HOSPITAL_COMMUNITY): Payer: 59 | Admitting: Hematology

## 2019-08-31 VITALS — BP 108/72 | HR 77 | Temp 98.6°F | Resp 16

## 2019-08-31 VITALS — BP 123/82 | HR 89 | Temp 96.7°F | Resp 18 | Wt 226.6 lb

## 2019-08-31 DIAGNOSIS — C4922 Malignant neoplasm of connective and soft tissue of left lower limb, including hip: Secondary | ICD-10-CM

## 2019-08-31 DIAGNOSIS — Z5111 Encounter for antineoplastic chemotherapy: Secondary | ICD-10-CM | POA: Diagnosis not present

## 2019-08-31 DIAGNOSIS — Z95828 Presence of other vascular implants and grafts: Secondary | ICD-10-CM

## 2019-08-31 LAB — CBC WITH DIFFERENTIAL/PLATELET
Abs Immature Granulocytes: 0.47 10*3/uL — ABNORMAL HIGH (ref 0.00–0.07)
Basophils Absolute: 0.1 10*3/uL (ref 0.0–0.1)
Basophils Relative: 1 %
Eosinophils Absolute: 0.1 10*3/uL (ref 0.0–0.5)
Eosinophils Relative: 2 %
HCT: 38.1 % — ABNORMAL LOW (ref 39.0–52.0)
Hemoglobin: 12.2 g/dL — ABNORMAL LOW (ref 13.0–17.0)
Immature Granulocytes: 6 %
Lymphocytes Relative: 11 %
Lymphs Abs: 1 10*3/uL (ref 0.7–4.0)
MCH: 30.2 pg (ref 26.0–34.0)
MCHC: 32 g/dL (ref 30.0–36.0)
MCV: 94.3 fL (ref 80.0–100.0)
Monocytes Absolute: 0.8 10*3/uL (ref 0.1–1.0)
Monocytes Relative: 9 %
Neutro Abs: 5.9 10*3/uL (ref 1.7–7.7)
Neutrophils Relative %: 71 %
Platelets: 298 10*3/uL (ref 150–400)
RBC: 4.04 MIL/uL — ABNORMAL LOW (ref 4.22–5.81)
RDW: 17.6 % — ABNORMAL HIGH (ref 11.5–15.5)
WBC: 8.3 10*3/uL (ref 4.0–10.5)
nRBC: 0.4 % — ABNORMAL HIGH (ref 0.0–0.2)

## 2019-08-31 LAB — COMPREHENSIVE METABOLIC PANEL
ALT: 32 U/L (ref 0–44)
AST: 30 U/L (ref 15–41)
Albumin: 3.7 g/dL (ref 3.5–5.0)
Alkaline Phosphatase: 144 U/L — ABNORMAL HIGH (ref 38–126)
Anion gap: 12 (ref 5–15)
BUN: 15 mg/dL (ref 6–20)
CO2: 22 mmol/L (ref 22–32)
Calcium: 8.9 mg/dL (ref 8.9–10.3)
Chloride: 102 mmol/L (ref 98–111)
Creatinine, Ser: 0.81 mg/dL (ref 0.61–1.24)
GFR calc Af Amer: >60 mL/min (ref >60)
GFR calc non Af Amer: >60 mL/min (ref >60)
Glucose, Bld: 125 mg/dL — ABNORMAL HIGH (ref 70–99)
Potassium: 3.6 mmol/L (ref 3.5–5.1)
Sodium: 136 mmol/L (ref 135–145)
Total Bilirubin: 0.5 mg/dL (ref 0.3–1.2)
Total Protein: 7.1 g/dL (ref 6.5–8.1)

## 2019-08-31 LAB — HEMOGLOBIN A1C
Hgb A1c MFr Bld: 5.3 % (ref 4.8–5.6)
Mean Plasma Glucose: 105.41 mg/dL

## 2019-08-31 LAB — LACTATE DEHYDROGENASE: LDH: 206 U/L — ABNORMAL HIGH (ref 98–192)

## 2019-08-31 LAB — MAGNESIUM: Magnesium: 2.2 mg/dL (ref 1.7–2.4)

## 2019-08-31 MED ORDER — SODIUM CHLORIDE 0.9 % IV SOLN
720.0000 mg/m2 | Freq: Once | INTRAVENOUS | Status: AC
  Administered 2019-08-31: 1634 mg via INTRAVENOUS
  Filled 2019-08-31: qty 26.3

## 2019-08-31 MED ORDER — SODIUM CHLORIDE 0.9 % IV SOLN: Freq: Once | INTRAVENOUS | Status: AC

## 2019-08-31 MED ORDER — HEPARIN SOD (PORK) LOCK FLUSH 100 UNIT/ML IV SOLN
500.0000 [IU] | Freq: Once | INTRAVENOUS | Status: AC | PRN
  Administered 2019-08-31: 500 [IU]

## 2019-08-31 MED ORDER — SODIUM CHLORIDE 0.9% FLUSH
10.0000 mL | INTRAVENOUS | Status: DC | PRN
  Administered 2019-08-31: 10 mL

## 2019-08-31 MED ORDER — ONDANSETRON HCL 4 MG/2ML IJ SOLN
8.0000 mg | Freq: Once | INTRAMUSCULAR | Status: AC
  Administered 2019-08-31: 8 mg via INTRAVENOUS
  Filled 2019-08-31: qty 4

## 2019-08-31 NOTE — Progress Notes (Signed)
Patient has been assessed today by Dr. Delton Coombes and labs reviewed. Patient is okay to proceed with his treatment today.  Primary RN and pharmacy aware. Dr. Delton Coombes wants to check hemoglobin A1C today to assess his glucose levels that are steadily staying high.

## 2019-08-31 NOTE — Progress Notes (Signed)
Leonard [name] presents today for D1C2 Gemzar. Pt denies any new changes or symptoms since last treatment. He reports he is still quite fatigued and has no appetite. Lab results and vitals have been reviewed and are stable and within parameters for treatment. Patient has been assessed by Dr. Delton Coombes who has approved proceeding with treatment today as planned. Per MD, todays dosage will be reduced by 20%.  Infusions tolerated without incident or complaint. VSS upon completion of treatment. Port flushed and deaccessed per protocol, see MAR and IV flowsheet for details. Discharged in satisfactory condition with follow up instructions.

## 2019-08-31 NOTE — Patient Instructions (Signed)
[place] at [facility] Discharge Instructions  You were seen today by Dr. Delton Coombes. He went over your recent results. You received your treatment today. Eat more calorie dense meals, or drink 2-3 cans of Boost Plus or Glucerna after your meals to maintain your weight. Dr. Delton Coombes will see you back in 1 week for labs and follow up.   Thank you for choosing [place] at [facility] to provide your oncology and hematology care.  To afford each patient quality time with our provider, please arrive at least 15 minutes before your scheduled appointment time.   If you have a lab appointment with the [place] please come in thru the Main Entrance and check in at the main information desk  You need to re-schedule your appointment should you arrive 10 or more minutes late.  We strive to give you quality time with our providers, and arriving late affects you and other patients whose appointments are after yours.  Also, if you no show three or more times for appointments you may be dismissed from the clinic at the providers discretion.     Again, thank you for choosing [facility].  Our hope is that these requests will decrease the amount of time that you wait before being seen by our physicians.       _____________________________________________________________  Should you have questions after your visit to [facility], please contact our office at [phone] between the hours of 8:00 a.m. and 4:30 p.m.  Voicemails left after 4:00 p.m. will not be returned until the following business day.  For prescription refill requests, have your pharmacy contact our office and allow 72 hours.    Cancer Center Support Programs:   > Cancer Support Group  2nd Tuesday of the month 1pm-2pm, Journey Room

## 2019-08-31 NOTE — Progress Notes (Signed)
Leonard [name], [place] [address]   CLINIC:  Medical Oncology/Hematology  PCP:  Celene Squibb, MD [address] [name] [place] [address] [phone]   REASON FOR VISIT:  Follow-up for metastatic dedifferentiated liposarcoma  PRIOR THERAPY: Gemcitabine and docetaxel followed by wide local excision on 11/10/2018  NGS Results: Pending  CURRENT THERAPY: Gemcitabine and docetaxel  BRIEF ONCOLOGIC HISTORY:  Oncology History  Liposarcoma of left lower extremity ([place])  04/25/2019 Initial Diagnosis   Liposarcoma of left lower extremity ([place])   05/22/2019 - 07/04/2019 Chemotherapy   The patient had DOXOrubicin (ADRIAMYCIN) chemo injection 174 mg, 75 mg/m2 = 174 mg, Intravenous,  Once, 3 of 6 cycles Administration: 174 mg (05/22/2019), 174 mg (06/13/2019), 174 mg (07/04/2019) palonosetron (ALOXI) injection 0.25 mg, 0.25 mg, Intravenous,  Once, 3 of 6 cycles Administration: 0.25 mg (05/22/2019), 0.25 mg (06/13/2019), 0.25 mg (07/04/2019) fosaprepitant (EMEND) 150 mg in sodium chloride 0.9 % 145 mL IVPB, 150 mg, Intravenous,  Once, 3 of 6 cycles Administration: 150 mg (05/22/2019), 150 mg (06/13/2019), 150 mg (07/04/2019)  for chemotherapy treatment.    08/11/2019 -  Chemotherapy   The patient had palonosetron (ALOXI) injection 0.25 mg, 0.25 mg, Intravenous,  Once, 1 of 4 cycles Administration: 0.25 mg (08/18/2019) ondansetron (ZOFRAN) injection 8 mg, 8 mg (100 % of original dose 8 mg), Intravenous,  Once, 1 of 4 cycles Dose modification: 8 mg (original dose 8 mg, Cycle 1) Administration: 8 mg (08/11/2019) pegfilgrastim (NEULASTA) injection 6 mg, 6 mg, Subcutaneous, Once, 1 of 1 cycle Administration: 6 mg (08/21/2019) pegfilgrastim (NEULASTA ONPRO KIT) injection 6 mg, 6 mg, Subcutaneous, Once, 1 of 4 cycles DOCEtaxel (TAXOTERE) 130 mg in sodium chloride 0.9 % 250 mL chemo infusion, 56.25 mg/m2 = 170 mg (75 % of original dose 100 mg/m2), Intravenous,  Once, 1 of 4  cycles Dose modification: 75 mg/m2 (75 % of original dose 100 mg/m2, Cycle 1, Reason: Provider Judgment) Administration: 130 mg (08/18/2019) gemcitabine (GEMZAR) 2,052 mg in sodium chloride 0.9 % 250 mL chemo infusion, 900 mg/m2 = 2,052 mg, Intravenous,  Once, 1 of 4 cycles Administration: 2,052 mg (08/11/2019), 1,520 mg (08/18/2019)  for chemotherapy treatment.    Liposarcoma ([place])  05/15/2019 Initial Diagnosis   Liposarcoma ([place])   05/15/2019 Cancer Staging   Staging form: Primary Cutaneous B-Cell/T-Cell Lymphoma (Non-MF/SS Lymphoma), AJCC 8th Edition - Clinical stage from 05/15/2019: [name], pM1 - Signed by [name], MD on 05/15/2019     CANCER STAGING: Cancer Staging Liposarcoma [facility]) Staging form: Primary Cutaneous B-Cell/T-Cell Lymphoma (Non-MF/SS Lymphoma), AJCC 8th Edition - Clinical stage from 05/15/2019: [name], pM1 - Signed by [name], MD on 05/15/2019  Liposarcoma of left lower extremity ([place]) Staging form: Primary Cutaneous B-Cell/T-Cell Lymphoma (Non-MF/SS Lymphoma), AJCC 8th Edition - Clinical: No stage assigned - Unsigned   INTERVAL HISTORY:  [name], a 59 y.o. male, returns for routine follow-up and consideration for next cycle of chemotherapy. Lajarvis was last seen on 08/25/2019.  Due for cycle #2 of gemcitabine and docetaxel today.   Today he is accompanied by his wife. He is feeling better this week than last week. His cough has worsened and is persistent day and night. The chest pain is about the same and intermittent; he takes the 1/2 tablet of oxycodone as needed, which is rare, though he drinks the Hydocan TID. He continues coughing up blood, mainly in the mornings, usually in small amounts of blood; his cough  is mainly dry. He reports that he felt more fatigued after receiving the gemcitabine and docetaxel in the last treatment, and his energy levels improve the further he gets from the chemo. He tries to stay hydrated with water and  Gatorade, along with 1 can of Boost daily whenever he does not feel like eating; his appetite has decreased since his taste is altered.  Overall, he feels ready for next cycle of chemo today.    REVIEW OF SYSTEMS:  Review of Systems  Constitutional: Positive for appetite change (moderately decreased) and fatigue (depleted).  Respiratory: Positive for cough (dry), hemoptysis (during morning coughing spell) and shortness of breath.   Cardiovascular: Positive for chest pain (4/10 chest pain).  Gastrointestinal: Positive for diarrhea.  Neurological: Positive for dizziness and headaches.  Psychiatric/Behavioral: Positive for depression.  All other systems reviewed and are negative.   PAST MEDICAL/SURGICAL HISTORY:  Past Medical History:  Diagnosis Date   Cancer ([place])    Port-A-Cath in place 05/19/2019   Past Surgical History:  Procedure Laterality Date   CHOLECYSTECTOMY     ESOPHAGEAL DILATION N/A 06/27/2017   Procedure: ESOPHAGEAL DILATION;  Surgeon: Danie Binder, MD;  [facility];  Service: Endoscopy;  Laterality: N/A;   ESOPHAGOGASTRODUODENOSCOPY N/A 06/27/2017   Procedure: ESOPHAGOGASTRODUODENOSCOPY (EGD);  Surgeon: Danie Binder, MD;  Location: [facility];  Service: Endoscopy;  Laterality: N/A;   IR IMAGING GUIDED PORT INSERTION  05/04/2019   knee sx      SOCIAL HISTORY:  Social History   Socioeconomic History   Marital status: Married    Spouse name: Not on file   Number of children: 1   Years of education: Not on file   Highest education level: Not on file  Occupational History   Occupation: Unemployed  Tobacco Use   Smoking status: Former Smoker   Smokeless tobacco: Never Used  Scientific laboratory technician Use: Never used  Substance and Sexual Activity   Alcohol use: Yes    Comment: 1-2 drinks per month   Drug use: Never   Sexual activity: Not Currently  Other Topics Concern   Not on file  Social History Narrative   DOES  CONSTRUCTION. MARRIED. RARE ETOH. NO TOBACCO.   Social Determinants of Health   Financial Resource Strain: Medium Risk   Difficulty of Paying Living Expenses: Somewhat hard  Food Insecurity: No Food Insecurity   Worried About Charity fundraiser in the Last Year: Never true   Ran Out of Food in the Last Year: Never true  Transportation Needs: No Transportation Needs   Lack of Transportation (Medical): No   Lack of Transportation (Non-Medical): No  Physical Activity: Inactive   Days of Exercise per Week: 0 days   Minutes of Exercise per Session: 0 min  Stress: Stress Concern Present   Feeling of Stress : To some extent  Social Connections: Moderately Integrated   Frequency of Communication with Friends and Family: More than three times a week   Frequency of Social Gatherings with Friends and Family: Once a week   Attends Religious Services: More than 4 times per year   Active Member of [employer] or Organizations: No   Attends Archivist Meetings: Never   Marital Status: Married  Human resources officer Violence: Not At Risk   Fear of Current or Ex-Partner: No   Emotionally Abused: No   Physically Abused: No   Sexually Abused: No    FAMILY HISTORY:  Family History  Problem  Relation Age of Onset   Heart disease Father    Cancer Maternal Aunt    Cancer Maternal Uncle    Cancer Paternal Uncle    Vision loss Maternal Grandfather    Colon cancer Neg Hx    Colon polyps Neg Hx     CURRENT MEDICATIONS:  Current Outpatient Medications  Medication Sig Dispense Refill   ALPRAZolam (XANAX) 0.25 MG tablet Take 1 tablet (0.25 mg total) by mouth 2 (two) times daily as needed for anxiety. 60 tablet 0   DOCEtaxel (TAXOTERE IV) Inject into the vein.     Gemcitabine HCl (GEMZAR IV) Inject into the vein.     HYDROcodone-homatropine (HYCODAN) 5-1.5 MG/5ML syrup SMARTSIG:1 Teaspoon By Mouth Every 6 Hours PRN 480 mL 0   Oxycodone HCl 10 MG TABS Take 0.5 tablets  (5 mg total) by mouth every 8 (eight) hours as needed. 30 tablet 0   No current facility-administered medications for this visit.   Facility-Administered Medications Ordered in Other Visits  Medication Dose Route Frequency Provider Last Rate Last Admin   sodium chloride flush (NS) 0.9 % injection 10 mL  10 mL Intravenous PRN [name], MD   10 mL at 08/21/19 1508    ALLERGIES:  No Known Allergies  PHYSICAL EXAM:  Performance status (ECOG): 1 - Symptomatic but completely ambulatory  Vitals:   08/31/19 0906  BP: 123/82  Pulse: 89  Resp: 18  Temp: (!) 96.7 F (35.9 C)  SpO2: 94%   Wt Readings from Last 3 Encounters:  08/31/19 226 lb 9.6 oz (102.8 kg)  08/25/19 230 lb 12.8 oz (104.7 kg)  08/11/19 (!) 233 lb 14.5 oz (106.1 kg)   Physical Exam Vitals reviewed.  Constitutional:      Appearance: Normal appearance. He is obese.  Cardiovascular:     Rate and Rhythm: Normal rate and regular rhythm.     Pulses: Normal pulses.     Heart sounds: Normal heart sounds.  Pulmonary:     Effort: Pulmonary effort is normal.     Breath sounds: Normal breath sounds.  Chest:     Comments: Port-a-Cath in R chest Abdominal:     Palpations: Abdomen is soft. There is no mass.     Tenderness: There is no abdominal tenderness.  Neurological:     General: No focal deficit present.     Mental Status: He is alert and oriented to person, place, and time.  Psychiatric:        Mood and Affect: Mood normal.        Behavior: Behavior normal.     LABORATORY DATA:  I have reviewed the labs as listed.  CBC Latest Ref Rng & Units 08/31/2019 08/25/2019 08/18/2019  WBC 4.0 - 10.5 K/uL 8.3 10.1 3.9(L)  Hemoglobin 13.0 - 17.0 g/dL 12.2(L) 12.0(L) 12.6(L)  Hematocrit 39 - 52 % 38.1(L) 36.4(L) 38.3(L)  Platelets 150 - 400 K/uL 298 86(L) 127(L)   CMP Latest Ref Rng & Units 08/31/2019 08/25/2019 08/18/2019  Glucose 70 - 99 mg/dL 125(H) 108(H) 118(H)  BUN 6 - 20 mg/dL 15 14 11   Creatinine 0.61 -  1.24 mg/dL 0.81 0.99 0.81  Sodium 135 - 145 mmol/L 136 133(L) 139  Potassium 3.5 - 5.1 mmol/L 3.6 3.7 3.7  Chloride 98 - 111 mmol/L 102 98 104  CO2 22 - 32 mmol/L 22 22 24   Calcium 8.9 - 10.3 mg/dL 8.9 8.8(L) 9.0  Total Protein 6.5 - 8.1 g/dL 7.1 7.1 7.4  Total Bilirubin 0.3 -  1.2 mg/dL 0.5 1.2 0.8  Alkaline Phos 38 - 126 U/L 144(H) 135(H) 112  AST 15 - 41 U/L 30 34 73(H)  ALT 0 - 44 U/L 32 60(H) 89(H)   Lab Results  Component Value Date   LDH 206 (H) 08/31/2019   LDH 394 (H) 08/25/2019   LDH 184 08/18/2019    DIAGNOSTIC IMAGING:  I have independently reviewed the scans and discussed with the patient. No results found.   ASSESSMENT:  1. 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 -Chemoradiation therapy with gemcitabine and docetaxel followed by wide local excision. -Pathology on 11/10/2018 showed liposarcoma with 80% necrosis, grade 2, ypT4, NX, positive deep margin, amplification of MDM 2 detected. Amplification is associated with well-differentiated liposarcoma/atypical lipomatous tumors and dedifferentiated liposarcoma. -CT CAP in the ER on 04/24/2019 showed widespread pulmonary metastatic lesions throughout both lungs, largest left upper lobe mass measuring 4.7 x 4.2 cm. Mass in the inferior lingula measures 4.12 x 3.2 cm. -Bone scan on 05/02/2019 did not show any evidence of metastatic disease. -2D echo on 05/02/2019 shows EF 60 to 65%. -Adriamycin every 3 weeks started on 05/22/2019. -CT CAP on 07/19/2019 showed dominant lung lesions have decreased in size. 2 nodules in the right major fissure have progressed while some other pulmonary nodules are unchanged. No evidence of metastatic disease in the abdomen or pelvis. -Gemcitabine and docetaxel every 21 days started on 08/11/2019.  2. Left kidney lesion: -CT on 04/24/2019 showed mass arising in the  periphery of the upper lobe of the left kidney measuring 1.3 x 1.4 cm. -CT scan from [facility] in August 2020 was showing left kidney lesion. -Current scan on 07/19/2019 showed 15 mm hypoattenuating lesion in the upper pole left kidney stable since prior.   PLAN:  1. Metastatic dedifferentiated liposarcoma: -He has completed first cycle of gemcitabine and docetaxel. -I have reviewed his labs today.  Alkaline phosphatase is elevated.  144.  Rest of LFTs are normal.  LDH was 206.  CBC shows normal white count and platelet count. -He will proceed with cycle 2-day 1 of gemcitabine.  We will reevaluate him in 1 week.  2. Dry cough: -He is taking hydrocodone syrup 2-3 times a day for dry cough.  Reports slight worsening of cough.  Occasional hemoptysis present.  3. Anxiety: -Continue Xanax 0.25 mg at bedtime as needed.  4. Right-sided chest wall pain: -This has improved.  He is not requiring oxycodone 10 mg half tablet on a daily basis.  5.  Elevated blood sugar: -His glucose is 125, fasting. -We will check hemoglobin A1c levels today.   Orders placed this encounter:  No orders of the defined types were placed in this encounter.    [name], MD [place] [phone]   I, Milinda Antis, am acting as a scribe for Dr. Sanda Linger.  I, [name] MD, have reviewed the above documentation for accuracy and completeness, and I agree with the above.

## 2019-08-31 NOTE — Patient Instructions (Signed)
[facility] Discharge Instructions for Patients Receiving Chemotherapy   Beginning January 23rd 2017 lab work for the [facility] will be done in the  Main lab at [facility] on 1st floor. If you have a lab appointment with the [facility] please come in thru the  Main Entrance and check in at the main information desk   Today you received the following chemotherapy agents Gemzar  To help prevent nausea and vomiting after your treatment, we encourage you to take your nausea medication   If you develop nausea and vomiting, or diarrhea that is not controlled by your medication, call the clinic.  The clinic phone number is [phone]. Office hours are Monday-Friday 8:30am-5:00pm.  BELOW ARE SYMPTOMS THAT SHOULD BE REPORTED IMMEDIATELY:  *FEVER GREATER THAN 101.0 F  *CHILLS WITH OR WITHOUT FEVER  NAUSEA AND VOMITING THAT IS NOT CONTROLLED WITH YOUR NAUSEA MEDICATION  *UNUSUAL SHORTNESS OF BREATH  *UNUSUAL BRUISING OR BLEEDING  TENDERNESS IN MOUTH AND THROAT WITH OR WITHOUT PRESENCE OF ULCERS  *URINARY PROBLEMS  *BOWEL PROBLEMS  UNUSUAL RASH Items with * indicate a potential emergency and should be followed up as soon as possible. If you have an emergency after office hours please contact your primary care physician or go to the nearest emergency department.  Please call the clinic during office hours if you have any questions or concerns.   You may also contact the Patient Navigator at [phone] should you have any questions or need assistance in obtaining follow up care.      Resources For Cancer Patients and their Caregivers ? American Cancer Society: Can assist with transportation, wigs, general needs, runs Look Good Feel Better.        667-557-5108 ? Cancer Care: Provides financial assistance, online support groups, medication/co-pay assistance.  1-800-813-HOPE 985 876 8281) ? [place] Assists [place] cancer  patients and their families through emotional , educational and financial support.  7371959378 ? [place] DSS Where to apply for food stamps, Medicaid and utility assistance. (602) 396-5487 ? [facility]: Transportation to medical appointments. 4172689304 ? Social Security Administration: May apply for disability if have a Stage IV cancer. (403) 298-2171 616-070-9795 ? [employer], Disability and Transit Services: Assists with nutrition, care and transit needs. 805-221-5224

## 2019-09-07 ENCOUNTER — Inpatient Hospital Stay (HOSPITAL_COMMUNITY): Payer: 59

## 2019-09-07 ENCOUNTER — Other Ambulatory Visit: Payer: Self-pay

## 2019-09-07 ENCOUNTER — Inpatient Hospital Stay (HOSPITAL_BASED_OUTPATIENT_CLINIC_OR_DEPARTMENT_OTHER): Payer: 59 | Admitting: Hematology

## 2019-09-07 VITALS — BP 124/85 | HR 91 | Temp 97.5°F | Resp 18 | Wt 226.1 lb

## 2019-09-07 VITALS — BP 113/77 | HR 71 | Temp 97.7°F | Resp 17

## 2019-09-07 DIAGNOSIS — C4922 Malignant neoplasm of connective and soft tissue of left lower limb, including hip: Secondary | ICD-10-CM

## 2019-09-07 DIAGNOSIS — Z5111 Encounter for antineoplastic chemotherapy: Secondary | ICD-10-CM | POA: Diagnosis not present

## 2019-09-07 DIAGNOSIS — Z95828 Presence of other vascular implants and grafts: Secondary | ICD-10-CM

## 2019-09-07 LAB — MAGNESIUM: Magnesium: 2.2 mg/dL (ref 1.7–2.4)

## 2019-09-07 LAB — COMPREHENSIVE METABOLIC PANEL
ALT: 45 U/L — ABNORMAL HIGH (ref 0–44)
AST: 41 U/L (ref 15–41)
Albumin: 3.7 g/dL (ref 3.5–5.0)
Alkaline Phosphatase: 123 U/L (ref 38–126)
Anion gap: 10 (ref 5–15)
BUN: 13 mg/dL (ref 6–20)
CO2: 23 mmol/L (ref 22–32)
Calcium: 8.9 mg/dL (ref 8.9–10.3)
Chloride: 102 mmol/L (ref 98–111)
Creatinine, Ser: 0.84 mg/dL (ref 0.61–1.24)
GFR calc Af Amer: >60 mL/min (ref >60)
GFR calc non Af Amer: >60 mL/min (ref >60)
Glucose, Bld: 135 mg/dL — ABNORMAL HIGH (ref 70–99)
Potassium: 3.4 mmol/L — ABNORMAL LOW (ref 3.5–5.1)
Sodium: 135 mmol/L (ref 135–145)
Total Bilirubin: 0.7 mg/dL (ref 0.3–1.2)
Total Protein: 7 g/dL (ref 6.5–8.1)

## 2019-09-07 LAB — CBC WITH DIFFERENTIAL/PLATELET
Abs Immature Granulocytes: 0.04 10*3/uL (ref 0.00–0.07)
Basophils Absolute: 0.1 10*3/uL (ref 0.0–0.1)
Basophils Relative: 1 %
Eosinophils Absolute: 0 10*3/uL (ref 0.0–0.5)
Eosinophils Relative: 0 %
HCT: 34.5 % — ABNORMAL LOW (ref 39.0–52.0)
Hemoglobin: 11.3 g/dL — ABNORMAL LOW (ref 13.0–17.0)
Immature Granulocytes: 1 %
Lymphocytes Relative: 14 %
Lymphs Abs: 0.7 10*3/uL (ref 0.7–4.0)
MCH: 30.6 pg (ref 26.0–34.0)
MCHC: 32.8 g/dL (ref 30.0–36.0)
MCV: 93.5 fL (ref 80.0–100.0)
Monocytes Absolute: 0.4 10*3/uL (ref 0.1–1.0)
Monocytes Relative: 10 %
Neutro Abs: 3.4 10*3/uL (ref 1.7–7.7)
Neutrophils Relative %: 74 %
Platelets: 332 10*3/uL (ref 150–400)
RBC: 3.69 MIL/uL — ABNORMAL LOW (ref 4.22–5.81)
RDW: 16.3 % — ABNORMAL HIGH (ref 11.5–15.5)
WBC: 4.6 10*3/uL (ref 4.0–10.5)
nRBC: 0 % (ref 0.0–0.2)

## 2019-09-07 LAB — LACTATE DEHYDROGENASE: LDH: 164 U/L (ref 98–192)

## 2019-09-07 MED ORDER — HEPARIN SOD (PORK) LOCK FLUSH 100 UNIT/ML IV SOLN
500.0000 [IU] | Freq: Once | INTRAVENOUS | Status: AC | PRN
  Administered 2019-09-07: 500 [IU]

## 2019-09-07 MED ORDER — SODIUM CHLORIDE 0.9 % IV SOLN
75.0000 mg/m2 | Freq: Once | INTRAVENOUS | Status: AC
  Administered 2019-09-07: 170 mg via INTRAVENOUS
  Filled 2019-09-07: qty 17

## 2019-09-07 MED ORDER — SODIUM CHLORIDE 0.9% FLUSH
10.0000 mL | INTRAVENOUS | Status: DC | PRN
  Administered 2019-09-07: 10 mL

## 2019-09-07 MED ORDER — PALONOSETRON HCL INJECTION 0.25 MG/5ML
0.2500 mg | Freq: Once | INTRAVENOUS | Status: AC
  Administered 2019-09-07: 0.25 mg via INTRAVENOUS
  Filled 2019-09-07: qty 5

## 2019-09-07 MED ORDER — PEGFILGRASTIM 6 MG/0.6ML ~~LOC~~ PSKT
6.0000 mg | PREFILLED_SYRINGE | Freq: Once | SUBCUTANEOUS | Status: AC
  Administered 2019-09-07: 6 mg via SUBCUTANEOUS
  Filled 2019-09-07: qty 0.6

## 2019-09-07 MED ORDER — SODIUM CHLORIDE 0.9 % IV SOLN
10.0000 mg | Freq: Once | INTRAVENOUS | Status: AC
  Administered 2019-09-07: 10 mg via INTRAVENOUS
  Filled 2019-09-07: qty 10

## 2019-09-07 MED ORDER — SODIUM CHLORIDE 0.9 % IV SOLN: Freq: Once | INTRAVENOUS | Status: AC

## 2019-09-07 MED ORDER — SODIUM CHLORIDE 0.9 % IV SOLN
720.0000 mg/m2 | Freq: Once | INTRAVENOUS | Status: AC
  Administered 2019-09-07: 1634 mg via INTRAVENOUS
  Filled 2019-09-07: qty 26.3

## 2019-09-07 NOTE — Patient Instructions (Signed)
[facility] Discharge Instructions for Patients Receiving Chemotherapy  Today you received the following chemotherapy agents   To help prevent nausea and vomiting after your treatment, we encourage you to take your nausea medication   If you develop nausea and vomiting that is not controlled by your nausea medication, call the clinic.   BELOW ARE SYMPTOMS THAT SHOULD BE REPORTED IMMEDIATELY:  *FEVER GREATER THAN 100.5 F  *CHILLS WITH OR WITHOUT FEVER  NAUSEA AND VOMITING THAT IS NOT CONTROLLED WITH YOUR NAUSEA MEDICATION  *UNUSUAL SHORTNESS OF BREATH  *UNUSUAL BRUISING OR BLEEDING  TENDERNESS IN MOUTH AND THROAT WITH OR WITHOUT PRESENCE OF ULCERS  *URINARY PROBLEMS  *BOWEL PROBLEMS  UNUSUAL RASH Items with * indicate a potential emergency and should be followed up as soon as possible.  Feel free to call the clinic should you have any questions or concerns. The clinic phone number is [phone].  Please show the CHEMO ALERT CARD at check-in to the Emergency Department and triage nurse.   

## 2019-09-07 NOTE — Patient Instructions (Signed)
[place] at [facility] Discharge Instructions  You were seen today by Dr. Delton Coombes. He went over your recent results. You received your treatment today. You can take 2 tablets of Xanax before going to sleep as needed. Drink 3-4 cans of Ensure Plus or Boost Plus daily to increase your weight. Dr. Delton Coombes will see you back in 2 weeks for labs and follow up.   Thank you for choosing [facility] at [facility] to provide your oncology and hematology care.  To afford each patient quality time with our provider, please arrive at least 15 minutes before your scheduled appointment time.   If you have a lab appointment with the [facility] please come in thru the Main Entrance and check in at the main information desk  You need to re-schedule your appointment should you arrive 10 or more minutes late.  We strive to give you quality time with our providers, and arriving late affects you and other patients whose appointments are after yours.  Also, if you no show three or more times for appointments you may be dismissed from the clinic at the providers discretion.     Again, thank you for choosing [facility].  Our hope is that these requests will decrease the amount of time that you wait before being seen by our physicians.       _____________________________________________________________  Should you have questions after your visit to [facility], please contact our office at [phone] between the hours of 8:00 a.m. and 4:30 p.m.  Voicemails left after 4:00 p.m. will not be returned until the following business day.  For prescription refill requests, have your pharmacy contact our office and allow 72 hours.    Cancer Center Support Programs:   > Cancer Support Group  2nd Tuesday of the month 1pm-2pm, Journey Room

## 2019-09-07 NOTE — Progress Notes (Signed)
[name], [place] [address]   CLINIC:  Medical Oncology/Hematology  PCP:  Leonard Squibb, MD [address] Leonard [name] [place] [address] [phone]   REASON FOR VISIT:  Follow-up for metastatic dedifferentiated liposarcoma  PRIOR THERAPY: Gemcitabine and docetaxel followed by wide local excision on 11/10/2018  NGS Results: Pending  CURRENT THERAPY: Gemcitabine and docetaxel 2 weeks on, 1 week off  BRIEF ONCOLOGIC HISTORY:  Oncology History  Liposarcoma of left lower extremity (Leonard [name])  04/25/2019 Initial Diagnosis   Liposarcoma of left lower extremity ([name])   05/22/2019 - 07/04/2019 Chemotherapy   The patient had DOXOrubicin (ADRIAMYCIN) chemo injection 174 mg, 75 mg/m2 = 174 mg, Intravenous,  Once, 3 of 6 cycles Administration: 174 mg (05/22/2019), 174 mg (06/13/2019), 174 mg (07/04/2019) palonosetron (ALOXI) injection 0.25 mg, 0.25 mg, Intravenous,  Once, 3 of 6 cycles Administration: 0.25 mg (05/22/2019), 0.25 mg (06/13/2019), 0.25 mg (07/04/2019) fosaprepitant (EMEND) 150 mg in sodium chloride 0.9 % 145 mL IVPB, 150 mg, Intravenous,  Once, 3 of 6 cycles Administration: 150 mg (05/22/2019), 150 mg (06/13/2019), 150 mg (07/04/2019)  for chemotherapy treatment.    08/11/2019 -  Chemotherapy   The patient had palonosetron (ALOXI) injection 0.25 mg, 0.25 mg, Intravenous,  Once, 2 of 4 cycles Administration: 0.25 mg (08/18/2019) ondansetron (ZOFRAN) injection 8 mg, 8 mg (100 % of original dose 8 mg), Intravenous,  Once, 2 of 4 cycles Dose modification: 8 mg (original dose 8 mg, Cycle 1) Administration: 8 mg (08/11/2019), 8 mg (08/31/2019) pegfilgrastim (NEULASTA) injection 6 mg, 6 mg, Subcutaneous, Once, 1 of 1 cycle Administration: 6 mg (08/21/2019) pegfilgrastim (NEULASTA ONPRO KIT) injection 6 mg, 6 mg, Subcutaneous, Once, 2 of 4 cycles DOCEtaxel (TAXOTERE) 130 mg in sodium chloride 0.9 % 250 mL chemo infusion, 56.25 mg/m2 = 170 mg (75 % of original dose 100  mg/m2), Intravenous,  Once, 2 of 4 cycles Dose modification: 75 mg/m2 (75 % of original dose 100 mg/m2, Cycle 1, Reason: Provider Judgment) Administration: 130 mg (08/18/2019) gemcitabine (GEMZAR) 2,052 mg in sodium chloride 0.9 % 250 mL chemo infusion, 900 mg/m2 = 2,052 mg, Intravenous,  Once, 2 of 4 cycles Dose modification: 720 mg/m2 (80 % of original dose 900 mg/m2, Cycle 2, Reason: Provider Judgment) Administration: 2,052 mg (08/11/2019), 1,520 mg (08/18/2019), 1,634 mg (08/31/2019)  for chemotherapy treatment.    Liposarcoma ([name])  05/15/2019 Initial Diagnosis   Liposarcoma ([name])   05/15/2019 Cancer Staging   Staging form: Primary Cutaneous B-Cell/T-Cell Lymphoma (Non-MF/SS Lymphoma), AJCC 8th Edition - Clinical stage from 05/15/2019: Leonard [name], pM1 - Signed by Leonard Jack, MD on 05/15/2019     CANCER STAGING: Cancer Staging Liposarcoma [facility]) Staging form: Primary Cutaneous B-Cell/T-Cell Lymphoma (Non-MF/SS Lymphoma), AJCC 8th Edition - Clinical stage from 05/15/2019: Leonard [name], pM1 - Signed by Leonard Jack, MD on 05/15/2019  Liposarcoma of left lower extremity ([place]) Staging form: Primary Cutaneous B-Cell/T-Cell Lymphoma (Non-MF/SS Lymphoma), AJCC 8th Edition - Clinical: No stage assigned - Unsigned   INTERVAL HISTORY:  [name], a 59 y.o. male, returns for routine follow-up and consideration for next cycle of chemotherapy. Leonard [name] was last seen on 08/31/2019.  Due for day #8 of cycle #2 of docetaxel and gemcitabine today.   Today he is accompanied by his wife. He reports that he did not have a good night sleep, despite taking Xanax at night. He wakes up multiple times at night due to his cough, which is about the same.  His pain has almost completely resolved. He tolerated the treatment last week well and denies having numbness or tingling. His appetite is not good, though he drinks 3 cans of Boost or Ensure and drinking Gatorade daily; he is eating smaller portions  compared to before starting chemo.  Overall, he feels ready for next cycle of chemo today.    REVIEW OF SYSTEMS:  Review of Systems  Constitutional: Positive for appetite change (severely decreased), fatigue (depleted) and unexpected weight change (down 3 lbs in 1 week).  Respiratory: Positive for cough (worsening).   Gastrointestinal: Positive for constipation (occasional) and diarrhea (occasional).  Neurological: Negative for numbness.  Psychiatric/Behavioral: Positive for depression and sleep disturbance.  All other systems reviewed and are negative.   PAST MEDICAL/SURGICAL HISTORY:  Past Medical History:  Diagnosis Date  . Cancer ([place])   . Port-A-Cath in place 05/19/2019   right   Past Surgical History:  Procedure Laterality Date  . CHOLECYSTECTOMY    . ESOPHAGEAL DILATION N/A 06/27/2017   Procedure: ESOPHAGEAL DILATION;  Surgeon: Danie Binder, MD;  Location[facility];  Service: Endoscopy;  Laterality: N/A;  . ESOPHAGOGASTRODUODENOSCOPY N/A 06/27/2017   Procedure: ESOPHAGOGASTRODUODENOSCOPY (EGD);  Surgeon: Danie Binder, MD;  Location: [facility];  Service: Endoscopy;  Laterality: N/A;  . IR IMAGING GUIDED PORT INSERTION  05/04/2019  . knee sx      SOCIAL HISTORY:  Social History   Socioeconomic History  . Marital status: Married    Spouse name: Not on file  . Number of children: 1  . Years of education: Not on file  . Highest education level: Not on file  Occupational History  . Occupation: Unemployed  Tobacco Use  . Smoking status: Former Research scientist (life sciences)  . Smokeless tobacco: Never Used  Vaping Use  . Vaping Use: Never used  Substance and Sexual Activity  . Alcohol use: Yes    Comment: 1-2 drinks per month  . Drug use: Never  . Sexual activity: Not Currently  Other Topics Concern  . Not on file  Social History Narrative   DOES CONSTRUCTION. MARRIED. RARE ETOH. NO TOBACCO.   Social Determinants of Health   Financial Resource Strain: Medium Risk  .  Difficulty of Paying Living Expenses: Somewhat hard  Food Insecurity: No Food Insecurity  . Worried About Charity fundraiser in the Last Year: Never true  . Ran Out of Food in the Last Year: Never true  Transportation Needs: No Transportation Needs  . Lack of Transportation (Medical): No  . Lack of Transportation (Non-Medical): No  Physical Activity: Inactive  . Days of Exercise per Week: 0 days  . Minutes of Exercise per Session: 0 min  Stress: Stress Concern Present  . Feeling of Stress : To some extent  Social Connections: Moderately Integrated  . Frequency of Communication with Friends and Family: More than three times a week  . Frequency of Social Gatherings with Friends and Family: Once a week  . Attends Religious Services: More than 4 times per year  . Active Member of Clubs or Organizations: No  . Attends Archivist Meetings: Never  . Marital Status: Married  Human resources officer Violence: Not At Risk  . Fear of Current or Ex-Partner: No  . Emotionally Abused: No  . Physically Abused: No  . Sexually Abused: No    FAMILY HISTORY:  Family History  Problem Relation Age of Onset  . Heart disease Father   . Cancer Maternal Aunt   .  Cancer Maternal Uncle   . Cancer Paternal Uncle   . Vision loss Maternal Grandfather   . Colon cancer Neg Hx   . Colon polyps Neg Hx     CURRENT MEDICATIONS:  Current Outpatient Medications  Medication Sig Dispense Refill  . ALPRAZolam (XANAX) 0.25 MG tablet Take 1 tablet (0.25 mg total) by mouth 2 (two) times daily as needed for anxiety. 60 tablet 0  . DOCEtaxel (TAXOTERE IV) Inject into the vein.    . Gemcitabine HCl (GEMZAR IV) Inject into the vein.    Marland Kitchen HYDROcodone-homatropine (HYCODAN) 5-1.5 MG/5ML syrup SMARTSIG:1 Teaspoon By Mouth Every 6 Hours PRN 480 mL 0  . Oxycodone HCl 10 MG TABS Take 0.5 tablets (5 mg total) by mouth every 8 (eight) hours as needed. 30 tablet 0   No current facility-administered medications for this  visit.   Facility-Administered Medications Ordered in Other Visits  Medication Dose Route Frequency Provider Last Rate Last Admin  . sodium chloride flush (NS) 0.9 % injection 10 mL  10 mL Intravenous PRN Leonard Jack, MD   10 mL at 08/21/19 1508    ALLERGIES:  No Known Allergies  PHYSICAL EXAM:  Performance status (ECOG): 1 - Symptomatic but completely ambulatory  Vitals:   09/07/19 0806  BP: 124/85  Pulse: 91  Resp: 18  Temp: (!) 97.5 F (36.4 C)  SpO2: 94%   Wt Readings from Last 3 Encounters:  09/07/19 226 lb 1.6 oz (102.6 kg)  08/31/19 226 lb 9.6 oz (102.8 kg)  08/25/19 230 lb 12.8 oz (104.7 kg)   Physical Exam Vitals reviewed.  Constitutional:      Appearance: Normal appearance. He is obese.  Cardiovascular:     Rate and Rhythm: Normal rate and regular rhythm.     Pulses: Normal pulses.     Heart sounds: Normal heart sounds.  Pulmonary:     Effort: Pulmonary effort is normal.     Breath sounds: Normal breath sounds.  Chest:     Comments: Port-a-Cath in R chest Abdominal:     Palpations: Abdomen is soft. There is no mass.     Tenderness: There is no abdominal tenderness.  Neurological:     General: No focal deficit present.     Mental Status: He is alert and oriented to person, place, and time.  Psychiatric:        Mood and Affect: Mood normal.        Behavior: Behavior normal.     LABORATORY DATA:  I have reviewed the labs as listed.  CBC Latest Ref Rng & Units 09/07/2019 08/31/2019 08/25/2019  WBC 4.0 - 10.5 K/uL 4.6 8.3 10.1  Hemoglobin 13.0 - 17.0 g/dL 11.3(L) 12.2(L) 12.0(L)  Hematocrit 39 - 52 % 34.5(L) 38.1(L) 36.4(L)  Platelets 150 - 400 K/uL 332 298 86(L)   CMP Latest Ref Rng & Units 08/31/2019 08/25/2019 08/18/2019  Glucose 70 - 99 mg/dL 125(H) 108(H) 118(H)  BUN 6 - 20 mg/dL 15 14 11   Creatinine 0.61 - 1.24 mg/dL 0.81 0.99 0.81  Sodium 135 - 145 mmol/L 136 133(L) 139  Potassium 3.5 - 5.1 mmol/L 3.6 3.7 3.7  Chloride 98 - 111 mmol/L  102 98 104  CO2 22 - 32 mmol/L 22 22 24   Calcium 8.9 - 10.3 mg/dL 8.9 8.8(L) 9.0  Total Protein 6.5 - 8.1 g/dL 7.1 7.1 7.4  Total Bilirubin 0.3 - 1.2 mg/dL 0.5 1.2 0.8  Alkaline Phos 38 - 126 U/L 144(H) 135(H) 112  AST 15 -  41 U/L 30 34 73(H)  ALT 0 - 44 U/L 32 60(H) 89(H)   Lab Results  Component Value Date   LDH 206 (H) 08/31/2019   LDH 394 (H) 08/25/2019   LDH 184 08/18/2019    DIAGNOSTIC IMAGING:  I have independently reviewed the scans and discussed with the patient. No results found.   ASSESSMENT:  1. 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 -Chemoradiation therapy with gemcitabine and docetaxel followed by wide local excision. -Pathology on 11/10/2018 showed liposarcoma with 80% necrosis, grade 2, ypT4, NX, positive deep margin, amplification of MDM 2 detected. Amplification is associated with well-differentiated liposarcoma/atypical lipomatous tumors and dedifferentiated liposarcoma. -CT CAP in the ER on 04/24/2019 showed widespread pulmonary metastatic lesions throughout both lungs, largest left upper lobe mass measuring 4.7 x 4.2 cm. Mass in the inferior lingula measures 4.12 x 3.2 cm. -Bone scan on 05/02/2019 did not show any evidence of metastatic disease. -2D echo on 05/02/2019 shows EF 60 to 65%. -Adriamycin every 3 weeks started on 05/22/2019. -CT CAP on 07/19/2019 showed dominant lung lesions have decreased in size. 2 nodules in the right major fissure have progressed while some other pulmonary nodules are unchanged. No evidence of metastatic disease in the abdomen or pelvis. -Gemcitabine and docetaxel every 21 days started on 08/11/2019.  2. Left kidney lesion: -CT on 04/24/2019 showed mass arising in the periphery of the upper lobe of the left kidney measuring 1.3 x 1.4 cm. -[facility] in August 2020 was showing left  kidney lesion. -Current scan on 07/19/2019 showed 15 mm hypoattenuating lesion in the upper pole left kidney stable since prior.   PLAN:  1. Metastatic dedifferentiated liposarcoma: -He is here for cycle 2-day 8 of gemcitabine and docetaxel. -He did not have any chemotherapy related side effects after last week's treatment.  However he lost some weight. -His CBC reviewed by me was adequate. -His LFTs grossly normal, with very minimal elevation of ALT.  Bilirubin is normal.  He will proceed with his day 8 treatment today. -He will be seen back in 2 weeks for follow-up prior to cycle 3-day 1.  We plan to schedule for scans at that time after cycle 3.  He will receive Neulasta on pro today.  2. Dry cough: -Continue hydrocodone syrup 2-3 times a day for dry cough.  3. Anxiety: -He is taking Xanax 0.25 mg at bedtime as needed.  He is not able to sleep because of stress. -I have asked him to double up on Xanax at bedtime.  4. Right-sided chest wall pain: -The pain has improved significantly.  He is not requiring oxycodone.  5.  Elevated blood sugar: -His fasting glucose was borderline.  We checked his hemoglobin A1c which was 5.3.  6.  Weight loss: -He lost about 4 pounds from the beginning of the month. -I have asked him to increase his boost plus to 3 times a day.  He is currently drinking 2 cans/day and eating 2 meals per day.   Orders placed this encounter:  No orders of the defined types were placed in this encounter.    Leonard Jack, MD [place] [phone]   I, Milinda Antis, am acting as a scribe for Dr. Sanda Linger.  I, Leonard Jack MD, have reviewed the above documentation for accuracy and completeness, and I agree with the above.

## 2019-09-07 NOTE — Progress Notes (Signed)
Patient presents today for treatment and follow up visit with Dr. Dannielle Burn. Vital signs within parameters for treatment. Patient denies pain today. Patient denies any changes since his last visit. Patient's wife at the bedside. Patient's wife states his cough is ongoing and more frequent.   Message received from Dr. Delton Coombes proceed with treatment. Labs within parameters for treatment and reviewed by MD.   Treatment given today per MD orders. Tolerated infusion without adverse affects. Vital signs stable. No complaints at this time. Discharged from clinic ambulatory. F/U with [facility] as scheduled.

## 2019-09-07 NOTE — Progress Notes (Signed)
Patient was assessed by Dr. Katragadda and labs have been reviewed.  Patient is okay to proceed with treatment today. Primary RN and pharmacy aware.   

## 2019-09-08 ENCOUNTER — Encounter (HOSPITAL_COMMUNITY): Payer: Self-pay | Admitting: *Deleted

## 2019-09-08 ENCOUNTER — Other Ambulatory Visit (HOSPITAL_COMMUNITY): Payer: Self-pay | Admitting: *Deleted

## 2019-09-08 DIAGNOSIS — C499 Malignant neoplasm of connective and soft tissue, unspecified: Secondary | ICD-10-CM

## 2019-09-08 MED ORDER — ESCITALOPRAM OXALATE 10 MG PO TABS: 10.0000 mg | ORAL_TABLET | Freq: Every day | ORAL | 2 refills | Status: DC

## 2019-09-08 MED ORDER — HYDROCODONE-HOMATROPINE 5-1.5 MG/5ML PO SYRP: ORAL_SOLUTION | ORAL | 0 refills | Status: DC

## 2019-09-08 NOTE — Progress Notes (Signed)
Patient and wife expressed some concern with depression and not feeling well enough to do anything.  Mainly activity includes sitting at home on the cough with no desire to do much else.  I spoke with Dr. Delton Coombes and orders received for lexapro 10 mg by mouth once daily.    I have notified patient.  At that time he also requested a refill on his cough medication.  That refill will be sent in as well.

## 2019-09-21 ENCOUNTER — Inpatient Hospital Stay (HOSPITAL_COMMUNITY): Payer: 59

## 2019-09-21 ENCOUNTER — Inpatient Hospital Stay (HOSPITAL_COMMUNITY): Payer: 59 | Admitting: Hematology

## 2019-09-21 ENCOUNTER — Other Ambulatory Visit: Payer: Self-pay

## 2019-09-21 ENCOUNTER — Inpatient Hospital Stay (HOSPITAL_COMMUNITY): Payer: 59 | Attending: Hematology

## 2019-09-21 VITALS — BP 111/74 | HR 67 | Temp 97.0°F | Resp 17

## 2019-09-21 VITALS — BP 119/79 | HR 72 | Temp 97.0°F | Resp 18 | Wt 222.4 lb

## 2019-09-21 DIAGNOSIS — Z95828 Presence of other vascular implants and grafts: Secondary | ICD-10-CM

## 2019-09-21 DIAGNOSIS — R630 Anorexia: Secondary | ICD-10-CM | POA: Diagnosis not present

## 2019-09-21 DIAGNOSIS — R6883 Chills (without fever): Secondary | ICD-10-CM | POA: Insufficient documentation

## 2019-09-21 DIAGNOSIS — C4922 Malignant neoplasm of connective and soft tissue of left lower limb, including hip: Secondary | ICD-10-CM

## 2019-09-21 DIAGNOSIS — K1230 Oral mucositis (ulcerative), unspecified: Secondary | ICD-10-CM | POA: Diagnosis not present

## 2019-09-21 DIAGNOSIS — R918 Other nonspecific abnormal finding of lung field: Secondary | ICD-10-CM | POA: Insufficient documentation

## 2019-09-21 DIAGNOSIS — Z79899 Other long term (current) drug therapy: Secondary | ICD-10-CM | POA: Diagnosis not present

## 2019-09-21 DIAGNOSIS — G479 Sleep disorder, unspecified: Secondary | ICD-10-CM | POA: Insufficient documentation

## 2019-09-21 DIAGNOSIS — F418 Other specified anxiety disorders: Secondary | ICD-10-CM | POA: Insufficient documentation

## 2019-09-21 DIAGNOSIS — R0789 Other chest pain: Secondary | ICD-10-CM | POA: Insufficient documentation

## 2019-09-21 DIAGNOSIS — Z87891 Personal history of nicotine dependence: Secondary | ICD-10-CM | POA: Diagnosis not present

## 2019-09-21 DIAGNOSIS — Z5111 Encounter for antineoplastic chemotherapy: Secondary | ICD-10-CM | POA: Insufficient documentation

## 2019-09-21 DIAGNOSIS — R5383 Other fatigue: Secondary | ICD-10-CM | POA: Diagnosis not present

## 2019-09-21 DIAGNOSIS — R739 Hyperglycemia, unspecified: Secondary | ICD-10-CM | POA: Diagnosis not present

## 2019-09-21 DIAGNOSIS — R634 Abnormal weight loss: Secondary | ICD-10-CM | POA: Insufficient documentation

## 2019-09-21 DIAGNOSIS — R05 Cough: Secondary | ICD-10-CM | POA: Insufficient documentation

## 2019-09-21 LAB — COMPREHENSIVE METABOLIC PANEL
ALT: 37 U/L (ref 0–44)
AST: 37 U/L (ref 15–41)
Albumin: 3.7 g/dL (ref 3.5–5.0)
Alkaline Phosphatase: 132 U/L — ABNORMAL HIGH (ref 38–126)
Anion gap: 10 (ref 5–15)
BUN: 12 mg/dL (ref 6–20)
CO2: 24 mmol/L (ref 22–32)
Calcium: 9.1 mg/dL (ref 8.9–10.3)
Chloride: 104 mmol/L (ref 98–111)
Creatinine, Ser: 0.85 mg/dL (ref 0.61–1.24)
GFR calc Af Amer: >60 mL/min (ref >60)
GFR calc non Af Amer: >60 mL/min (ref >60)
Glucose, Bld: 96 mg/dL (ref 70–99)
Potassium: 3.8 mmol/L (ref 3.5–5.1)
Sodium: 138 mmol/L (ref 135–145)
Total Bilirubin: 0.5 mg/dL (ref 0.3–1.2)
Total Protein: 6.8 g/dL (ref 6.5–8.1)

## 2019-09-21 LAB — CBC WITH DIFFERENTIAL/PLATELET
Abs Immature Granulocytes: 0.22 10*3/uL — ABNORMAL HIGH (ref 0.00–0.07)
Basophils Absolute: 0.1 10*3/uL (ref 0.0–0.1)
Basophils Relative: 2 %
Eosinophils Absolute: 0.1 10*3/uL (ref 0.0–0.5)
Eosinophils Relative: 2 %
HCT: 40 % (ref 39.0–52.0)
Hemoglobin: 12.5 g/dL — ABNORMAL LOW (ref 13.0–17.0)
Immature Granulocytes: 4 %
Lymphocytes Relative: 13 %
Lymphs Abs: 0.8 10*3/uL (ref 0.7–4.0)
MCH: 30.5 pg (ref 26.0–34.0)
MCHC: 31.3 g/dL (ref 30.0–36.0)
MCV: 97.6 fL (ref 80.0–100.0)
Monocytes Absolute: 0.6 10*3/uL (ref 0.1–1.0)
Monocytes Relative: 9 %
Neutro Abs: 4.4 10*3/uL (ref 1.7–7.7)
Neutrophils Relative %: 70 %
Platelets: 316 10*3/uL (ref 150–400)
RBC: 4.1 MIL/uL — ABNORMAL LOW (ref 4.22–5.81)
RDW: 19.4 % — ABNORMAL HIGH (ref 11.5–15.5)
WBC: 6.2 10*3/uL (ref 4.0–10.5)
nRBC: 0 % (ref 0.0–0.2)

## 2019-09-21 LAB — LACTATE DEHYDROGENASE: LDH: 204 U/L — ABNORMAL HIGH (ref 98–192)

## 2019-09-21 LAB — MAGNESIUM: Magnesium: 2.4 mg/dL (ref 1.7–2.4)

## 2019-09-21 MED ORDER — DIPHENHYDRAMINE HCL 50 MG/ML IJ SOLN
INTRAMUSCULAR | Status: AC
  Filled 2019-09-21: qty 1

## 2019-09-21 MED ORDER — SODIUM CHLORIDE 0.9 % IV SOLN: Freq: Once | INTRAVENOUS | Status: AC

## 2019-09-21 MED ORDER — SODIUM CHLORIDE 0.9% FLUSH
10.0000 mL | INTRAVENOUS | Status: DC | PRN
  Administered 2019-09-21: 10 mL

## 2019-09-21 MED ORDER — SODIUM CHLORIDE 0.9 % IV SOLN
720.0000 mg/m2 | Freq: Once | INTRAVENOUS | Status: AC
  Administered 2019-09-21: 1634 mg via INTRAVENOUS
  Filled 2019-09-21: qty 26.3

## 2019-09-21 MED ORDER — DIPHENHYDRAMINE HCL 50 MG/ML IJ SOLN
25.0000 mg | Freq: Once | INTRAMUSCULAR | Status: AC
  Administered 2019-09-21: 25 mg via INTRAVENOUS

## 2019-09-21 MED ORDER — HEPARIN SOD (PORK) LOCK FLUSH 100 UNIT/ML IV SOLN
500.0000 [IU] | Freq: Once | INTRAVENOUS | Status: AC | PRN
  Administered 2019-09-21: 500 [IU]

## 2019-09-21 MED ORDER — ALUMINUM-MAGNESIUM-SIMETHICONE 200-200-20 MG/5ML PO SUSP: ORAL | 2 refills | Status: DC

## 2019-09-21 MED ORDER — ONDANSETRON HCL 4 MG/2ML IJ SOLN
8.0000 mg | Freq: Once | INTRAMUSCULAR | Status: AC
  Administered 2019-09-21: 8 mg via INTRAVENOUS
  Filled 2019-09-21: qty 4

## 2019-09-21 NOTE — Progress Notes (Signed)
Leonard [name] presents today for D1C3 Gemzar. Pt denies any new changes or symptoms since last treatment. Lab results and vitals have been reviewed and are stable and within parameters for treatment. Patient has been assessed by Dr. Delton Coombes who has approved proceeding with treatment today as planned.  Infusions tolerated without incident or complaint. VSS upon completion of treatment. Port flushed and deaccessed per protocol, see MAR and IV flowsheet for details. Discharged in satisfactory condition with follow up instructions.

## 2019-09-21 NOTE — Progress Notes (Signed)
[place], [place] [address]   CLINIC:  Medical Oncology/Hematology  PCP:  Celene Squibb, MD [address] [name] [address] [phone]   REASON FOR VISIT:  Follow-up for metastatic dedifferentiated liposarcoma  PRIOR THERAPY: Gemcitabine and docetaxel followed by wide local excision on 11/10/2018  NGS Results: Pending  CURRENT THERAPY: Gemcitabine and docetaxel 2 weeks on, 1 week off  BRIEF ONCOLOGIC HISTORY:  Oncology History  Liposarcoma of left lower extremity ([place])  04/25/2019 Initial Diagnosis   Liposarcoma of left lower extremity ([place])   05/22/2019 - 07/04/2019 Chemotherapy   The patient had DOXOrubicin (ADRIAMYCIN) chemo injection 174 mg, 75 mg/m2 = 174 mg, Intravenous,  Once, 3 of 6 cycles Administration: 174 mg (05/22/2019), 174 mg (06/13/2019), 174 mg (07/04/2019) palonosetron (ALOXI) injection 0.25 mg, 0.25 mg, Intravenous,  Once, 3 of 6 cycles Administration: 0.25 mg (05/22/2019), 0.25 mg (06/13/2019), 0.25 mg (07/04/2019) fosaprepitant (EMEND) 150 mg in sodium chloride 0.9 % 145 mL IVPB, 150 mg, Intravenous,  Once, 3 of 6 cycles Administration: 150 mg (05/22/2019), 150 mg (06/13/2019), 150 mg (07/04/2019)  for chemotherapy treatment.    08/11/2019 -  Chemotherapy   The patient had palonosetron (ALOXI) injection 0.25 mg, 0.25 mg, Intravenous,  Once, 2 of 4 cycles Administration: 0.25 mg (08/18/2019), 0.25 mg (09/07/2019) ondansetron (ZOFRAN) injection 8 mg, 8 mg (100 % of original dose 8 mg), Intravenous,  Once, 2 of 4 cycles Dose modification: 8 mg (original dose 8 mg, Cycle 1) Administration: 8 mg (08/11/2019), 8 mg (08/31/2019) pegfilgrastim (NEULASTA) injection 6 mg, 6 mg, Subcutaneous, Once, 1 of 1 cycle Administration: 6 mg (08/21/2019) pegfilgrastim (NEULASTA ONPRO KIT) injection 6 mg, 6 mg, Subcutaneous, Once, 2 of 4 cycles Administration: 6 mg (09/07/2019) DOCEtaxel (TAXOTERE) 130 mg in sodium chloride 0.9 % 250 mL chemo  infusion, 56.25 mg/m2 = 170 mg (75 % of original dose 100 mg/m2), Intravenous,  Once, 2 of 4 cycles Dose modification: 75 mg/m2 (75 % of original dose 100 mg/m2, Cycle 1, Reason: Provider Judgment), 75 mg/m2 (75 % of original dose 100 mg/m2, Cycle 2, Reason: Provider Judgment) Administration: 130 mg (08/18/2019), 170 mg (09/07/2019) gemcitabine (GEMZAR) 2,052 mg in sodium chloride 0.9 % 250 mL chemo infusion, 900 mg/m2 = 2,052 mg, Intravenous,  Once, 2 of 4 cycles Dose modification: 720 mg/m2 (80 % of original dose 900 mg/m2, Cycle 2, Reason: Provider Judgment) Administration: 2,052 mg (08/11/2019), 1,520 mg (08/18/2019), 1,634 mg (08/31/2019), 1,634 mg (09/07/2019)  for chemotherapy treatment.    Liposarcoma ([place])  05/15/2019 Initial Diagnosis   Liposarcoma ([place])   05/15/2019 Cancer Staging   Staging form: Primary Cutaneous B-Cell/T-Cell Lymphoma (Non-MF/SS Lymphoma), AJCC 8th Edition - Clinical stage from 05/15/2019: [name], pM1 - Signed by [name], MD on 05/15/2019     CANCER STAGING: Cancer Staging Liposarcoma [facility]) Staging form: Primary Cutaneous B-Cell/T-Cell Lymphoma (Non-MF/SS Lymphoma), AJCC 8th Edition - Clinical stage from 05/15/2019: [name], pM1 - Signed by [name], MD on 05/15/2019  Liposarcoma of left lower extremity ([place]) Staging form: Primary Cutaneous B-Cell/T-Cell Lymphoma (Non-MF/SS Lymphoma), AJCC 8th Edition - Clinical: No stage assigned - Unsigned   INTERVAL HISTORY:  [name], a 59 y.o. male, returns for routine follow-up and consideration for next cycle of chemotherapy. [name] was last seen on 09/07/2019.  Due for cycle #3 of gemcitabine today.   Today he is accompanied by his wife. Overall, he tells me he has been feeling pretty well.  He reports feeling better this week than the previous week after receiving docetaxel; he was extremely fatigued and had zero appetite last week. This week his appetite and energy levels have improved; his  taste has completely disappeared and tastes like cardboard. His cough was also worse last week with worse chest pain on the flanks, but his cough has improved; he continues taking Hycodan for his cough. He drinks 3-4 cans of Ensure daily and is eating more meals this week compared to last week. His mouth sores worsened last week, in particular inside his lips and in the back of the cheeks, but denies sore throat. His mood has improved since starting the Lexapro.  Overall, he feels ready for next cycle of chemo today.    REVIEW OF SYSTEMS:  Review of Systems  Constitutional: Positive for appetite change (severely decreased), chills and fatigue (moderate).  HENT:   Positive for mouth sores (inside of lips and back of cheeks). Negative for sore throat.   Respiratory: Positive for cough and shortness of breath.   Cardiovascular: Positive for chest pain.  All other systems reviewed and are negative.   PAST MEDICAL/SURGICAL HISTORY:  Past Medical History:  Diagnosis Date  . Cancer ([place])   . Port-A-Cath in place 05/19/2019   right   Past Surgical History:  Procedure Laterality Date  . CHOLECYSTECTOMY    . ESOPHAGEAL DILATION N/A 06/27/2017   Procedure: ESOPHAGEAL DILATION;  Surgeon: Danie Binder, MD;  [facility];  Service: Endoscopy;  Laterality: N/A;  . ESOPHAGOGASTRODUODENOSCOPY N/A 06/27/2017   Procedure: ESOPHAGOGASTRODUODENOSCOPY (EGD);  Surgeon: Danie Binder, MD;  Location: [facility];  Service: Endoscopy;  Laterality: N/A;  . IR IMAGING GUIDED PORT INSERTION  05/04/2019  . knee sx      SOCIAL HISTORY:  Social History   Socioeconomic History  . Marital status: Married    Spouse name: Not on file  . Number of children: 1  . Years of education: Not on file  . Highest education level: Not on file  Occupational History  . Occupation: Unemployed  Tobacco Use  . Smoking status: Former Research scientist (life sciences)  . Smokeless tobacco: Never Used  Vaping Use  . Vaping Use: Never  used  Substance and Sexual Activity  . Alcohol use: Yes    Comment: 1-2 drinks per month  . Drug use: Never  . Sexual activity: Not Currently  Other Topics Concern  . Not on file  Social History Narrative   DOES CONSTRUCTION. MARRIED. RARE ETOH. NO TOBACCO.   Social Determinants of Health   Financial Resource Strain: Medium Risk  . Difficulty of Paying Living Expenses: Somewhat hard  Food Insecurity: No Food Insecurity  . Worried About Charity fundraiser in the Last Year: Never true  . Ran Out of Food in the Last Year: Never true  Transportation Needs: No Transportation Needs  . Lack of Transportation (Medical): No  . Lack of Transportation (Non-Medical): No  Physical Activity: Inactive  . Days of Exercise per Week: 0 days  . Minutes of Exercise per Session: 0 min  Stress: Stress Concern Present  . Feeling of Stress : To some extent  Social Connections: Moderately Integrated  . Frequency of Communication with Friends and Family: More than three times a week  . Frequency of Social Gatherings with Friends and Family: Once a week  . Attends Religious Services: More than 4 times per year  . Active Member of Clubs or Organizations: No  . Attends Club  or Organization Meetings: Never  . Marital Status: Married  Human resources officer Violence: Not At Risk  . Fear of Current or Ex-Partner: No  . Emotionally Abused: No  . Physically Abused: No  . Sexually Abused: No    FAMILY HISTORY:  Family History  Problem Relation Age of Onset  . Heart disease Father   . Cancer Maternal Aunt   . Cancer Maternal Uncle   . Cancer Paternal Uncle   . Vision loss Maternal Grandfather   . Colon cancer Neg Hx   . Colon polyps Neg Hx     CURRENT MEDICATIONS:  Current Outpatient Medications  Medication Sig Dispense Refill  . ALPRAZolam (XANAX) 0.25 MG tablet Take 1 tablet (0.25 mg total) by mouth 2 (two) times daily as needed for anxiety. 60 tablet 0  . DOCEtaxel (TAXOTERE IV) Inject into the  vein.    Marland Kitchen escitalopram (LEXAPRO) 10 MG tablet Take 1 tablet (10 mg total) by mouth daily. 30 tablet 2  . Gemcitabine HCl (GEMZAR IV) Inject into the vein.    Marland Kitchen HYDROcodone-homatropine (HYCODAN) 5-1.5 MG/5ML syrup SMARTSIG:1 Teaspoon By Mouth Every 6 Hours PRN 480 mL 0  . Oxycodone HCl 10 MG TABS Take 0.5 tablets (5 mg total) by mouth every 8 (eight) hours as needed. 30 tablet 0   No current facility-administered medications for this visit.   Facility-Administered Medications Ordered in Other Visits  Medication Dose Route Frequency Provider Last Rate Last Admin  . sodium chloride flush (NS) 0.9 % injection 10 mL  10 mL Intravenous PRN [name], MD   10 mL at 08/21/19 1508    ALLERGIES:  No Known Allergies  PHYSICAL EXAM:  Performance status (ECOG): 1 - Symptomatic but completely ambulatory  Vitals:   09/21/19 0857  BP: 119/79  Pulse: 72  Resp: 18  Temp: (!) 97 F (36.1 C)  SpO2: 92%   Wt Readings from Last 3 Encounters:  09/21/19 222 lb 6.4 oz (100.9 kg)  09/07/19 226 lb 1.6 oz (102.6 kg)  08/31/19 226 lb 9.6 oz (102.8 kg)   Physical Exam Vitals reviewed.  Constitutional:      Appearance: Normal appearance. He is obese.  Cardiovascular:     Rate and Rhythm: Normal rate and regular rhythm.     Pulses: Normal pulses.     Heart sounds: Normal heart sounds.  Pulmonary:     Effort: Pulmonary effort is normal.     Breath sounds: Normal breath sounds.  Chest:     Comments: Port-a-Cath in R chest Neurological:     General: No focal deficit present.     Mental Status: He is alert and oriented to person, place, and time.  Psychiatric:        Mood and Affect: Mood normal.        Behavior: Behavior normal.     LABORATORY DATA:  I have reviewed the labs as listed.  CBC Latest Ref Rng & Units 09/21/2019 09/07/2019 08/31/2019  WBC 4.0 - 10.5 K/uL 6.2 4.6 8.3  Hemoglobin 13.0 - 17.0 g/dL 12.5(L) 11.3(L) 12.2(L)  Hematocrit 39 - 52 % 40.0 34.5(L) 38.1(L)  Platelets  150 - 400 K/uL 316 332 298   CMP Latest Ref Rng & Units 09/21/2019 09/07/2019 08/31/2019  Glucose 70 - 99 mg/dL 96 135(H) 125(H)  BUN 6 - 20 mg/dL _0 Creatinine 0.61 - 1.24 mg/dL 0.85 0.84 0.81  Sodium 135 - 145 mmol/L 138 135 136  Potassium 3.5 - 5.1 mmol/L 3.8 3.4(L)  3.6  Chloride 98 - 111 mmol/L 104 102 102  CO2 22 - 32 mmol/L _0 Calcium 8.9 - 10.3 mg/dL 9.1 8.9 8.9  Total Protein 6.5 - 8.1 g/dL 6.8 7.0 7.1  Total Bilirubin 0.3 - 1.2 mg/dL 0.5 0.7 0.5  Alkaline Phos 38 - 126 U/L 132(H) 123 144(H)  AST 15 - 41 U/L 37 41 30  ALT 0 - 44 U/L 37 45(H) 32   Lab Results  Component Value Date   LDH 204 (H) 09/21/2019   LDH 164 09/07/2019   LDH 206 (H) 08/31/2019    DIAGNOSTIC IMAGING:  I have independently reviewed the scans and discussed with the patient. No results found.   ASSESSMENT:  1. 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 -Chemoradiation therapy with gemcitabine and docetaxel followed by wide local excision. -Pathology on 11/10/2018 showed liposarcoma with 80% necrosis, grade 2, ypT4, NX, positive deep margin, amplification of MDM 2 detected. Amplification is associated with well-differentiated liposarcoma/atypical lipomatous tumors and dedifferentiated liposarcoma. -CT CAP in the ER on 04/24/2019 showed widespread pulmonary metastatic lesions throughout both lungs, largest left upper lobe mass measuring 4.7 x 4.2 cm. Mass in the inferior lingula measures 4.12 x 3.2 cm. -Bone scan on 05/02/2019 did not show any evidence of metastatic disease. -2D echo on 05/02/2019 shows EF 60 to 65%. -Adriamycin every 3 weeks started on 05/22/2019. -CT CAP on 07/19/2019 showed dominant lung lesions have decreased in size. 2 nodules in the right major fissure have progressed while some other pulmonary nodules are unchanged. No evidence of metastatic disease in  the abdomen or pelvis. -Gemcitabine and docetaxel every 21 days started on 08/11/2019.  2. Left kidney lesion: -CT on 04/24/2019 showed mass arising in the periphery of the upper lobe of the left kidney measuring 1.3 x 1.4 cm. -[facility] in August 2020 was showing left kidney lesion. -Current scan on 07/19/2019 showed 15 mm hypoattenuating lesion in the upper pole left kidney stable since prior.   PLAN:  1. Metastatic dedifferentiated liposarcoma: -I have reviewed his labs.  LFTs have normalized except alk phos 132.  Electrolytes are normal.  CBC shows normal white count and platelets. -He will proceed with cycle 3-day 1 today.  I will dose reduce gemcitabine to 720 mg per metered square.  He will come back next week for follow-up.  2. Dry cough: -Continue Hycodan syrup daily.  He indicated cough is better this week compared to last week.  3. Anxiety: -Continue Xanax 0.25 mg / 0.5 mg at bedtime as needed.  4. Right-sided chest wall pain: -Pain is better this week.  He is not requiring oxycodone.  5. Elevated blood sugar: -.  As his sugars were elevated, we checked his hemoglobin A1c which was 5.3.  6.  Weight loss: -He lost 4 pounds in the last 2 weeks. -He is drinking boost 3 to 4 cans/day.  Eating better this week.  He is feeling very well this week compared to last week.  7.  Depression: -Lexapro is helping his mood.  8.  Mucositis: -He had mild mucositis after date of last cycle.  We will send prescription for Maalox with lidocaine.   Orders placed this encounter:  No orders of the defined types were placed in this encounter.    [name], MD [place] [phone]   I, Milinda Antis, am acting as a  scribe for Dr. Sanda Linger.  I, [name] MD, have reviewed the above documentation for accuracy and completeness, and I agree with the above.

## 2019-09-21 NOTE — Patient Instructions (Signed)
[place] at [facility] Discharge Instructions  You were seen today by Dr. Delton Coombes. He went over your recent results. You received your treatment today. You will be prescribed a mouthwash for your mouth sores; swish a tablespoon in your mouth for several seconds and spit out before eating a meal. Dr. Delton Coombes will see you back in 1 week for labs and follow up.   Thank you for choosing [place] at [facility] to provide your oncology and hematology care.  To afford each patient quality time with our provider, please arrive at least 15 minutes before your scheduled appointment time.   If you have a lab appointment with the [place] please come in thru the Main Entrance and check in at the main information desk  You need to re-schedule your appointment should you arrive 10 or more minutes late.  We strive to give you quality time with our providers, and arriving late affects you and other patients whose appointments are after yours.  Also, if you no show three or more times for appointments you may be dismissed from the clinic at the providers discretion.     Again, thank you for choosing [facility].  Our hope is that these requests will decrease the amount of time that you wait before being seen by our physicians.       _____________________________________________________________  Should you have questions after your visit to [facility], please contact our office at [phone] between the hours of 8:00 a.m. and 4:30 p.m.  Voicemails left after 4:00 p.m. will not be returned until the following business day.  For prescription refill requests, have your pharmacy contact our office and allow 72 hours.    Cancer Center Support Programs:   > Cancer Support Group  2nd Tuesday of the month 1pm-2pm, Journey Room

## 2019-09-21 NOTE — Patient Instructions (Signed)
[facility] Discharge Instructions for Patients Receiving Chemotherapy   Beginning January 23rd 2017 lab work for the [facility] will be done in the  Main lab at [facility] on 1st floor. If you have a lab appointment with the [place] please come in thru the  Main Entrance and check in at the main information desk   Today you received the following chemotherapy agents Gemzar  To help prevent nausea and vomiting after your treatment, we encourage you to take your nausea medication If you develop nausea and vomiting, or diarrhea that is not controlled by your medication, call the clinic.  The clinic phone number is [phone]. Office hours are Monday-Friday 8:30am-5:00pm.  BELOW ARE SYMPTOMS THAT SHOULD BE REPORTED IMMEDIATELY:  *FEVER GREATER THAN 101.0 F  *CHILLS WITH OR WITHOUT FEVER  NAUSEA AND VOMITING THAT IS NOT CONTROLLED WITH YOUR NAUSEA MEDICATION  *UNUSUAL SHORTNESS OF BREATH  *UNUSUAL BRUISING OR BLEEDING  TENDERNESS IN MOUTH AND THROAT WITH OR WITHOUT PRESENCE OF ULCERS  *URINARY PROBLEMS  *BOWEL PROBLEMS  UNUSUAL RASH Items with * indicate a potential emergency and should be followed up as soon as possible. If you have an emergency after office hours please contact your primary care physician or go to the nearest emergency department.  Please call the clinic during office hours if you have any questions or concerns.   You may also contact the Patient Navigator at [phone] should you have any questions or need assistance in obtaining follow up care.      Resources For Cancer Patients and their Caregivers ? American Cancer Society: Can assist with transportation, wigs, general needs, runs Look Good Feel Better.        712 276 6077 ? Cancer Care: Provides financial assistance, online support groups, medication/co-pay assistance.  1-800-813-HOPE 603-592-8389) ? [facility] Assists [place] cancer  patients and their families through emotional , educational and financial support.  330-038-4411 ? [place] DSS Where to apply for food stamps, Medicaid and utility assistance. (402) 250-3245 ? [facility]: Transportation to medical appointments. 346-768-5316 ? Social Security Administration: May apply for disability if have a Stage IV cancer. (336)116-5560 959 463 4616 ? [employer], Disability and Transit Services: Assists with nutrition, care and transit needs. 309-623-4765

## 2019-09-21 NOTE — Progress Notes (Signed)
Patient was assessed by Dr. Delton Coombes and labs have been reviewed.  Patient is having some trouble with mouth burning/sores.  We have sent maalox with viscous lidocaine to swish and swallow four times daily for mouth sores.  Patient is okay to proceed with treatment today. Primary RN and pharmacy aware.

## 2019-09-21 NOTE — Patient Instructions (Signed)
[facility] at [facility] Discharge Instructions  Labs drawn from portacath today   Thank you for choosing [facility] at [facility] to provide your oncology and hematology care.  To afford each patient quality time with our provider, please arrive at least 15 minutes before your scheduled appointment time.   If you have a lab appointment with the Cancer Center please come in thru the Main Entrance and check in at the main information desk.  You need to re-schedule your appointment should you arrive 10 or more minutes late.  We strive to give you quality time with our providers, and arriving late affects you and other patients whose appointments are after yours.  Also, if you no show three or more times for appointments you may be dismissed from the clinic at the providers discretion.     Again, thank you for choosing [facility].  Our hope is that these requests will decrease the amount of time that you wait before being seen by our physicians.       _____________________________________________________________  Should you have questions after your visit to [facility], please contact our office at [phone] and follow the prompts.  Our office hours are 8:00 a.m. and 4:30 p.m. Monday - Friday.  Please note that voicemails left after 4:00 p.m. may not be returned until the following business day.  We are closed weekends and major holidays.  You do have access to a nurse 24-7, just call the main number to the clinic [phone] and do not press any options, hold on the line and a nurse will answer the phone.    For prescription refill requests, have your pharmacy contact our office and allow 72 hours.    Due to Covid, you will need to wear a mask upon entering the hospital. If you do not have a mask, a mask will be given to you at the Main Entrance upon arrival. For doctor visits, patients may have 1 support person age 18  or older with them. For treatment visits, patients can not have anyone with them due to social distancing guidelines and our immunocompromised population.     

## 2019-09-22 ENCOUNTER — Telehealth (HOSPITAL_COMMUNITY): Payer: Self-pay

## 2019-09-22 NOTE — Telephone Encounter (Signed)
Nutrition Assessment:  Referral from RN, Diane regarding weight loss  59 year old male with metastatic liposarcoma of left lower extremity.  Patient receiving gemcitabine and docetaxel.    Spoke with patient via phone for nutrition assessment.  Patient reports that week during chemotherapy appetite is decrease especially when receives both chemo drugs.  Reports full feeling, loose stool.  Reports that food has no taste or taste like cardboard.  Has mouth sores as well.  Reports that he likes the taste of equate plus (350 calories and 13 g protein) better than ensure or boost.      Medications: maalox mouthwash with lidocaine, lexapro  Labs: reviewed  Anthropometrics:   Height: 69 inches Weight: 222 lb (9/9) 8/26 226 lb 7/9 235 lb BMI: 32  6% weight loss in the last 2 months, concerning   Estimated Energy Needs  Kcals: 2200-2500 Protein: 110-125 g Fluid: > 2.2 L  NUTRITION DIAGNOSIS: Inadequate oral intake related to cancer related treatment side effects as evidenced by 6% weight loss in the last 2 months and decrease appetite   INTERVENTION:  Discussed ways to add calories and protein to diet.  Will mail handout Discussed strategies to help with taste changes. Will mail handout Discussed strategies to help with sore mouth. Will mail handout Encouraged oral nutrition supplements 350 calories or more Contact information provided    MONITORING, EVALUATION, GOAL: weight trends, intake   NEXT VISIT: Oct 8, phone f/u  Cameron Katayama B. Zenia Resides, [place], [place] Registered Dietitian [phone] (mobile)

## 2019-09-28 ENCOUNTER — Inpatient Hospital Stay (HOSPITAL_COMMUNITY): Payer: 59

## 2019-09-28 ENCOUNTER — Inpatient Hospital Stay (HOSPITAL_COMMUNITY): Payer: 59 | Admitting: Hematology

## 2019-09-28 ENCOUNTER — Other Ambulatory Visit: Payer: Self-pay

## 2019-09-28 VITALS — BP 119/74 | HR 70 | Temp 97.3°F | Resp 18

## 2019-09-28 VITALS — BP 125/82 | HR 77 | Temp 97.0°F | Resp 18 | Wt 226.4 lb

## 2019-09-28 DIAGNOSIS — Z95828 Presence of other vascular implants and grafts: Secondary | ICD-10-CM

## 2019-09-28 DIAGNOSIS — C4922 Malignant neoplasm of connective and soft tissue of left lower limb, including hip: Secondary | ICD-10-CM

## 2019-09-28 DIAGNOSIS — Z5111 Encounter for antineoplastic chemotherapy: Secondary | ICD-10-CM | POA: Diagnosis not present

## 2019-09-28 LAB — CBC WITH DIFFERENTIAL/PLATELET
Abs Immature Granulocytes: 0.02 10*3/uL (ref 0.00–0.07)
Basophils Absolute: 0.1 10*3/uL (ref 0.0–0.1)
Basophils Relative: 3 %
Eosinophils Absolute: 0 10*3/uL (ref 0.0–0.5)
Eosinophils Relative: 1 %
HCT: 36.3 % — ABNORMAL LOW (ref 39.0–52.0)
Hemoglobin: 11.5 g/dL — ABNORMAL LOW (ref 13.0–17.0)
Immature Granulocytes: 1 %
Lymphocytes Relative: 26 %
Lymphs Abs: 0.5 10*3/uL — ABNORMAL LOW (ref 0.7–4.0)
MCH: 30.5 pg (ref 26.0–34.0)
MCHC: 31.7 g/dL (ref 30.0–36.0)
MCV: 96.3 fL (ref 80.0–100.0)
Monocytes Absolute: 0.4 10*3/uL (ref 0.1–1.0)
Monocytes Relative: 20 %
Neutro Abs: 1 10*3/uL — ABNORMAL LOW (ref 1.7–7.7)
Neutrophils Relative %: 49 %
Platelets: 248 10*3/uL (ref 150–400)
RBC: 3.77 MIL/uL — ABNORMAL LOW (ref 4.22–5.81)
RDW: 18.4 % — ABNORMAL HIGH (ref 11.5–15.5)
WBC: 2 10*3/uL — ABNORMAL LOW (ref 4.0–10.5)
nRBC: 0 % (ref 0.0–0.2)

## 2019-09-28 LAB — COMPREHENSIVE METABOLIC PANEL
ALT: 69 U/L — ABNORMAL HIGH (ref 0–44)
AST: 56 U/L — ABNORMAL HIGH (ref 15–41)
Albumin: 3.6 g/dL (ref 3.5–5.0)
Alkaline Phosphatase: 100 U/L (ref 38–126)
Anion gap: 8 (ref 5–15)
BUN: 9 mg/dL (ref 6–20)
CO2: 23 mmol/L (ref 22–32)
Calcium: 8.9 mg/dL (ref 8.9–10.3)
Chloride: 105 mmol/L (ref 98–111)
Creatinine, Ser: 0.72 mg/dL (ref 0.61–1.24)
GFR calc Af Amer: >60 mL/min (ref >60)
GFR calc non Af Amer: >60 mL/min (ref >60)
Glucose, Bld: 94 mg/dL (ref 70–99)
Potassium: 3.6 mmol/L (ref 3.5–5.1)
Sodium: 136 mmol/L (ref 135–145)
Total Bilirubin: 0.7 mg/dL (ref 0.3–1.2)
Total Protein: 6.5 g/dL (ref 6.5–8.1)

## 2019-09-28 LAB — LACTATE DEHYDROGENASE: LDH: 169 U/L (ref 98–192)

## 2019-09-28 LAB — MAGNESIUM: Magnesium: 2 mg/dL (ref 1.7–2.4)

## 2019-09-28 MED ORDER — SODIUM CHLORIDE 0.9 % IV SOLN: Freq: Once | INTRAVENOUS | Status: AC

## 2019-09-28 MED ORDER — PEGFILGRASTIM 6 MG/0.6ML ~~LOC~~ PSKT
PREFILLED_SYRINGE | SUBCUTANEOUS | Status: AC
  Filled 2019-09-28: qty 0.6

## 2019-09-28 MED ORDER — PEGFILGRASTIM 6 MG/0.6ML ~~LOC~~ PSKT
6.0000 mg | PREFILLED_SYRINGE | Freq: Once | SUBCUTANEOUS | Status: AC
  Administered 2019-09-28: 6 mg via SUBCUTANEOUS

## 2019-09-28 MED ORDER — SODIUM CHLORIDE 0.9 % IV SOLN
10.0000 mg | Freq: Once | INTRAVENOUS | Status: AC
  Administered 2019-09-28: 10 mg via INTRAVENOUS
  Filled 2019-09-28: qty 10

## 2019-09-28 MED ORDER — PALONOSETRON HCL INJECTION 0.25 MG/5ML
0.2500 mg | Freq: Once | INTRAVENOUS | Status: AC
  Administered 2019-09-28: 0.25 mg via INTRAVENOUS
  Filled 2019-09-28: qty 5

## 2019-09-28 MED ORDER — HEPARIN SOD (PORK) LOCK FLUSH 100 UNIT/ML IV SOLN
500.0000 [IU] | Freq: Once | INTRAVENOUS | Status: AC | PRN
  Administered 2019-09-28: 500 [IU]

## 2019-09-28 MED ORDER — SODIUM CHLORIDE 0.9 % IV SOLN
66.6667 mg/m2 | Freq: Once | INTRAVENOUS | Status: AC
  Administered 2019-09-28: 150 mg via INTRAVENOUS
  Filled 2019-09-28: qty 15

## 2019-09-28 MED ORDER — SODIUM CHLORIDE 0.9 % IV SOLN
720.0000 mg/m2 | Freq: Once | INTRAVENOUS | Status: AC
  Administered 2019-09-28: 1634 mg via INTRAVENOUS
  Filled 2019-09-28: qty 27.2

## 2019-09-28 MED ORDER — SODIUM CHLORIDE 0.9% FLUSH
10.0000 mL | INTRAVENOUS | Status: DC | PRN
  Administered 2019-09-28 (×2): 10 mL

## 2019-09-28 NOTE — Patient Instructions (Signed)
[place] at [facility] Discharge Instructions  You were seen today by Dr. Delton Coombes. He went over your recent results. You received your treatment today. Dr. Delton Coombes will see you back in 2 weeks for labs and follow up.   Thank you for choosing [place] at [facility] to provide your oncology and hematology care.  To afford each patient quality time with our provider, please arrive at least 15 minutes before your scheduled appointment time.   If you have a lab appointment with the [facility] please come in thru the Main Entrance and check in at the main information desk  You need to re-schedule your appointment should you arrive 10 or more minutes late.  We strive to give you quality time with our providers, and arriving late affects you and other patients whose appointments are after yours.  Also, if you no show three or more times for appointments you may be dismissed from the clinic at the providers discretion.     Again, thank you for choosing [facility].  Our hope is that these requests will decrease the amount of time that you wait before being seen by our physicians.       _____________________________________________________________  Should you have questions after your visit to [facility], please contact our office at [phone] between the hours of 8:00 a.m. and 4:30 p.m.  Voicemails left after 4:00 p.m. will not be returned until the following business day.  For prescription refill requests, have your pharmacy contact our office and allow 72 hours.    Cancer Center Support Programs:   > Cancer Support Group  2nd Tuesday of the month 1pm-2pm, Journey Room

## 2019-09-28 NOTE — Progress Notes (Signed)
Patient was assessed by Dr. Delton Coombes and labs have been reviewed.  LFTs abnormal, ANC 1.0.  Dr. Delton Coombes will dose reduce today further.  Patient is okay to proceed with treatment today. Primary RN and pharmacy aware.

## 2019-09-28 NOTE — Progress Notes (Signed)
Pt here for D8C3 of taxotere and gemzar. Pt will get neulasta on pro prior to discharge. ANC 0.5, WBC 2.0, AST 56, and ALT 56. Dr Raliegh Ip will dose reduce the taxotere further due to his myelotoxicities.  Labs reviewed with Dr Raliegh Ip and okay to proceed with treatment.    Tolerated treatment well today without incidence. Vital signs stable prior to discharge.  Discharged ambulatory.  Leonard KitchenElta Guadeloupe R [name] today for neulasta OBI placement per MD orders. OBI device filled per protocol and placed on left Upper Arm. Needle/catheter placement noted prior to patient leaving. Tolerated without incident and aware of injection to be delivered in  27 hours.

## 2019-09-28 NOTE — Patient Instructions (Signed)
[facility] Discharge Instructions for Patients Receiving Chemotherapy  Today you received the following chemotherapy agents   To help prevent nausea and vomiting after your treatment, we encourage you to take your nausea medication   If you develop nausea and vomiting that is not controlled by your nausea medication, call the clinic.   BELOW ARE SYMPTOMS THAT SHOULD BE REPORTED IMMEDIATELY:  *FEVER GREATER THAN 100.5 F  *CHILLS WITH OR WITHOUT FEVER  NAUSEA AND VOMITING THAT IS NOT CONTROLLED WITH YOUR NAUSEA MEDICATION  *UNUSUAL SHORTNESS OF BREATH  *UNUSUAL BRUISING OR BLEEDING  TENDERNESS IN MOUTH AND THROAT WITH OR WITHOUT PRESENCE OF ULCERS  *URINARY PROBLEMS  *BOWEL PROBLEMS  UNUSUAL RASH Items with * indicate a potential emergency and should be followed up as soon as possible.  Feel free to call the clinic should you have any questions or concerns. The clinic phone number is [phone].  Please show the CHEMO ALERT CARD at check-in to the Emergency Department and triage nurse.   

## 2019-09-28 NOTE — Progress Notes (Signed)
[place], [place] [address]   CLINIC:  Medical Oncology/Hematology  PCP:  Celene Squibb, MD [address] [name] [address] [phone]   REASON FOR VISIT:  Follow-up for metastatic dedifferentiated liposarcoma  PRIOR THERAPY: Gemcitabine and docetaxel followed by wide local excision on 11/10/2018  NGS Results: Pending  CURRENT THERAPY: Gemcitabine and docetaxel 2 weeks on, 1 week off  BRIEF ONCOLOGIC HISTORY:  Oncology History  Liposarcoma of left lower extremity ([place])  04/25/2019 Initial Diagnosis   Liposarcoma of left lower extremity ([place])   05/22/2019 - 07/04/2019 Chemotherapy   The patient had DOXOrubicin (ADRIAMYCIN) chemo injection 174 mg, 75 mg/m2 = 174 mg, Intravenous,  Once, 3 of 6 cycles Administration: 174 mg (05/22/2019), 174 mg (06/13/2019), 174 mg (07/04/2019) palonosetron (ALOXI) injection 0.25 mg, 0.25 mg, Intravenous,  Once, 3 of 6 cycles Administration: 0.25 mg (05/22/2019), 0.25 mg (06/13/2019), 0.25 mg (07/04/2019) fosaprepitant (EMEND) 150 mg in sodium chloride 0.9 % 145 mL IVPB, 150 mg, Intravenous,  Once, 3 of 6 cycles Administration: 150 mg (05/22/2019), 150 mg (06/13/2019), 150 mg (07/04/2019)  for chemotherapy treatment.    08/11/2019 -  Chemotherapy   The patient had palonosetron (ALOXI) injection 0.25 mg, 0.25 mg, Intravenous,  Once, 3 of 4 cycles Administration: 0.25 mg (08/18/2019), 0.25 mg (09/07/2019) ondansetron (ZOFRAN) injection 8 mg, 8 mg (100 % of original dose 8 mg), Intravenous,  Once, 3 of 4 cycles Dose modification: 8 mg (original dose 8 mg, Cycle 1) Administration: 8 mg (08/11/2019), 8 mg (08/31/2019), 8 mg (09/21/2019) pegfilgrastim (NEULASTA) injection 6 mg, 6 mg, Subcutaneous, Once, 1 of 1 cycle Administration: 6 mg (08/21/2019) pegfilgrastim (NEULASTA ONPRO KIT) injection 6 mg, 6 mg, Subcutaneous, Once, 3 of 4 cycles Administration: 6 mg (09/07/2019) DOCEtaxel (TAXOTERE) 130 mg in sodium chloride 0.9  % 250 mL chemo infusion, 56.25 mg/m2 = 170 mg (75 % of original dose 100 mg/m2), Intravenous,  Once, 3 of 4 cycles Dose modification: 75 mg/m2 (75 % of original dose 100 mg/m2, Cycle 1, Reason: Provider Judgment), 75 mg/m2 (75 % of original dose 100 mg/m2, Cycle 2, Reason: Provider Judgment), 75 mg/m2 (original dose 100 mg/m2, Cycle 3, Reason: Provider Judgment) Administration: 130 mg (08/18/2019), 170 mg (09/07/2019) gemcitabine (GEMZAR) 2,052 mg in sodium chloride 0.9 % 250 mL chemo infusion, 900 mg/m2 = 2,052 mg, Intravenous,  Once, 3 of 4 cycles Dose modification: 720 mg/m2 (80 % of original dose 900 mg/m2, Cycle 2, Reason: Provider Judgment) Administration: 2,052 mg (08/11/2019), 1,520 mg (08/18/2019), 1,634 mg (08/31/2019), 1,634 mg (09/07/2019), 1,634 mg (09/21/2019)  for chemotherapy treatment.    Liposarcoma ([place])  05/15/2019 Initial Diagnosis   Liposarcoma ([place])   05/15/2019 Cancer Staging   Staging form: Primary Cutaneous B-Cell/T-Cell Lymphoma (Non-MF/SS Lymphoma), AJCC 8th Edition - Clinical stage from 05/15/2019: [name], pM1 - Signed by [name], MD on 05/15/2019     CANCER STAGING: Cancer Staging Liposarcoma [facility]) Staging form: Primary Cutaneous B-Cell/T-Cell Lymphoma (Non-MF/SS Lymphoma), AJCC 8th Edition - Clinical stage from 05/15/2019: [name], pM1 - Signed by [name], MD on 05/15/2019  Liposarcoma of left lower extremity ([place]) Staging form: Primary Cutaneous B-Cell/T-Cell Lymphoma (Non-MF/SS Lymphoma), AJCC 8th Edition - Clinical: No stage assigned - Unsigned   INTERVAL HISTORY:  Leonard [name], a 59 y.o. male, returns for routine follow-up and consideration for next cycle of chemotherapy. Leonard [name] was last seen on 09/21/2019.  Due for day #8 of cycle #3 of docetaxel  and gemcitabine today.   Today he is accompanied by his wife. He reports feeling better this week than last week. He was nauseous for 2 days, but he denies vomiting. He denies CP and his cough  has improved and still without hemoptysis.   Overall, he feels ready for next cycle of chemo today.    REVIEW OF SYSTEMS:  Review of Systems  Constitutional: Positive for appetite change (moderately decreased) and fatigue (moderate).  Respiratory: Positive for cough (dry; improving) and shortness of breath (w/ exertion). Negative for hemoptysis.   Cardiovascular: Negative for chest pain.  Gastrointestinal: Positive for diarrhea and nausea (x2 days post chemo). Negative for vomiting.  Neurological: Positive for headaches.  Psychiatric/Behavioral: Positive for sleep disturbance.  All other systems reviewed and are negative.   PAST MEDICAL/SURGICAL HISTORY:  Past Medical History:  Diagnosis Date  . Cancer ()   . Port-A-Cath in place 05/19/2019   right   Past Surgical History:  Procedure Laterality Date  . CHOLECYSTECTOMY    . ESOPHAGEAL DILATION N/A 06/27/2017   Procedure: ESOPHAGEAL DILATION;  Surgeon: Danie Binder, MD;  Location: [facility];  Service: Endoscopy;  Laterality: N/A;  . ESOPHAGOGASTRODUODENOSCOPY N/A 06/27/2017   Procedure: ESOPHAGOGASTRODUODENOSCOPY (EGD);  Surgeon: Danie Binder, MD;  Location: [facility];  Service: Endoscopy;  Laterality: N/A;  . IR IMAGING GUIDED PORT INSERTION  05/04/2019  . knee sx      SOCIAL HISTORY:  Social History   Socioeconomic History  . Marital status: Married    Spouse name: Not on file  . Number of children: 1  . Years of education: Not on file  . Highest education level: Not on file  Occupational History  . Occupation: Unemployed  Tobacco Use  . Smoking status: Former Research scientist (life sciences)  . Smokeless tobacco: Never Used  Vaping Use  . Vaping Use: Never used  Substance and Sexual Activity  . Alcohol use: Yes    Comment: 1-2 drinks per month  . Drug use: Never  . Sexual activity: Not Currently  Other Topics Concern  . Not on file  Social History Narrative   DOES CONSTRUCTION. MARRIED. RARE ETOH. NO TOBACCO.   Social  Determinants of Health   Financial Resource Strain: Medium Risk  . Difficulty of Paying Living Expenses: Somewhat hard  Food Insecurity: No Food Insecurity  . Worried About Charity fundraiser in the Last Year: Never true  . Ran Out of Food in the Last Year: Never true  Transportation Needs: No Transportation Needs  . Lack of Transportation (Medical): No  . Lack of Transportation (Non-Medical): No  Physical Activity: Inactive  . Days of Exercise per Week: 0 days  . Minutes of Exercise per Session: 0 min  Stress: Stress Concern Present  . Feeling of Stress : To some extent  Social Connections: Moderately Integrated  . Frequency of Communication with Friends and Family: More than three times a week  . Frequency of Social Gatherings with Friends and Family: Once a week  . Attends Religious Services: More than 4 times per year  . Active Member of Clubs or Organizations: No  . Attends Archivist Meetings: Never  . Marital Status: Married  Human resources officer Violence: Not At Risk  . Fear of Current or Ex-Partner: No  . Emotionally Abused: No  . Physically Abused: No  . Sexually Abused: No    FAMILY HISTORY:  Family History  Problem Relation Age of Onset  . Heart disease Father   .  Cancer Maternal Aunt   . Cancer Maternal Uncle   . Cancer Paternal Uncle   . Vision loss Maternal Grandfather   . Colon cancer Neg Hx   . Colon polyps Neg Hx     CURRENT MEDICATIONS:  Current Outpatient Medications  Medication Sig Dispense Refill  . ALPRAZolam (XANAX) 0.25 MG tablet Take 1 tablet (0.25 mg total) by mouth 2 (two) times daily as needed for anxiety. 60 tablet 0  . aluminum-magnesium hydroxide-simethicone (MAALOX) 562-563-89 MG/5ML SUSP Swish and swallow 5 ml four times daily as needed for mouth sores. 600 mL 2  . DIPHENHIST 12.5 MG/5ML liquid SWISH AND SWALLOW ONETTEASPOONFUL 4 TIMES DAILYIAS NEEDED FOR MOUTH SORES.    . DOCEtaxel (TAXOTERE IV) Inject into the vein.    Marland Kitchen  escitalopram (LEXAPRO) 10 MG tablet Take 1 tablet (10 mg total) by mouth daily. 30 tablet 2  . Gemcitabine HCl (GEMZAR IV) Inject into the vein.    Marland Kitchen HYDROcodone-homatropine (HYCODAN) 5-1.5 MG/5ML syrup SMARTSIG:1 Teaspoon By Mouth Every 6 Hours PRN 480 mL 0  . lidocaine (XYLOCAINE) 2 % solution SMARTSIG:1 Teaspoon By Mouth 4 Times Daily PRN    . Oxycodone HCl 10 MG TABS Take 0.5 tablets (5 mg total) by mouth every 8 (eight) hours as needed. 30 tablet 0   No current facility-administered medications for this visit.   Facility-Administered Medications Ordered in Other Visits  Medication Dose Route Frequency Provider Last Rate Last Admin  . sodium chloride flush (NS) 0.9 % injection 10 mL  10 mL Intravenous PRN [name], MD   10 mL at 08/21/19 1508    ALLERGIES:  No Known Allergies  PHYSICAL EXAM:  Performance status (ECOG): 1 - Symptomatic but completely ambulatory  Vitals:   09/28/19 0908  BP: 125/82  Pulse: 77  Resp: 18  Temp: (!) 97 F (36.1 C)  SpO2: 97%   Wt Readings from Last 3 Encounters:  09/28/19 226 lb 6.6 oz (102.7 kg)  09/21/19 222 lb 6.4 oz (100.9 kg)  09/07/19 226 lb 1.6 oz (102.6 kg)   Physical Exam Vitals reviewed.  Constitutional:      Appearance: Normal appearance. He is obese.  Cardiovascular:     Rate and Rhythm: Normal rate and regular rhythm.     Pulses: Normal pulses.     Heart sounds: Normal heart sounds.  Pulmonary:     Effort: Pulmonary effort is normal.     Breath sounds: Normal breath sounds.  Chest:     Comments: Port-a-Cath in R chest Neurological:     General: No focal deficit present.     Mental Status: He is alert and oriented to person, place, and time.  Psychiatric:        Mood and Affect: Mood normal.        Behavior: Behavior normal.     LABORATORY DATA:  I have reviewed the labs as listed.  CBC Latest Ref Rng & Units 09/28/2019 09/21/2019 09/07/2019  WBC 4.0 - 10.5 K/uL 2.0(L) 6.2 4.6  Hemoglobin 13.0 - 17.0 g/dL  11.5(L) 12.5(L) 11.3(L)  Hematocrit 39 - 52 % 36.3(L) 40.0 34.5(L)  Platelets 150 - 400 K/uL 248 316 332   CMP Latest Ref Rng & Units 09/28/2019 09/21/2019 09/07/2019  Glucose 70 - 99 mg/dL 94 96 135(H)  BUN 6 - 20 mg/dL _0 Creatinine 0.61 - 1.24 mg/dL 0.72 0.85 0.84  Sodium 135 - 145 mmol/L 136 138 135  Potassium 3.5 - 5.1 mmol/L 3.6 3.8 3.4(L)  Chloride 98 - 111 mmol/L 105 104 102  CO2 22 - 32 mmol/L _0 Calcium 8.9 - 10.3 mg/dL 8.9 9.1 8.9  Total Protein 6.5 - 8.1 g/dL 6.5 6.8 7.0  Total Bilirubin 0.3 - 1.2 mg/dL 0.7 0.5 0.7  Alkaline Phos 38 - 126 U/L 100 132(H) 123  AST 15 - 41 U/L 56(H) 37 41  ALT 0 - 44 U/L 69(H) 37 45(H)   Lab Results  Component Value Date   LDH 169 09/28/2019   LDH 204 (H) 09/21/2019   LDH 164 09/07/2019    DIAGNOSTIC IMAGING:  I have independently reviewed the scans and discussed with the patient. No results found.   ASSESSMENT:  1. 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 -Chemoradiation therapy with gemcitabine and docetaxel followed by wide local excision. -Pathology on 11/10/2018 showed liposarcoma with 80% necrosis, grade 2, ypT4, NX, positive deep margin, amplification of MDM 2 detected. Amplification is associated with well-differentiated liposarcoma/atypical lipomatous tumors and dedifferentiated liposarcoma. -CT CAP in the ER on 04/24/2019 showed widespread pulmonary metastatic lesions throughout both lungs, largest left upper lobe mass measuring 4.7 x 4.2 cm. Mass in the inferior lingula measures 4.12 x 3.2 cm. -Bone scan on 05/02/2019 did not show any evidence of metastatic disease. -2D echo on 05/02/2019 shows EF 60 to 65%. -Adriamycin every 3 weeks started on 05/22/2019. -CT CAP on 07/19/2019 showed dominant lung lesions have decreased in size. 2 nodules in the right major fissure have progressed while some  other pulmonary nodules are unchanged. No evidence of metastatic disease in the abdomen or pelvis. -Gemcitabine and docetaxel every 21 days started on 08/11/2019.  2. Left kidney lesion: -CT on 04/24/2019 showed mass arising in the periphery of the upper lobe of the left kidney measuring 1.3 x 1.4 cm. -[facility] in August 2020 was showing left kidney lesion. -Current scan on 07/19/2019 showed 15 mm hypoattenuating lesion in the upper pole left kidney stable since prior.   PLAN:  1. Metastatic dedifferentiated liposarcoma: -He tolerated last treatment well.  Today labs showed elevated AST and ALT.  White count is 2.0.  ANC is around 980. -I will cut back on docetaxel dose.  We will maintain gemcitabine. -he will come back in 2 weeks to start cycle 4. -I plan to repeat scans after cycle 4.  2. Dry cough: -Cough has improved since last treatment.  Continue Hycodan at bedtime as needed.  3. Anxiety: -Continue Xanax 0.25-0.5 mg at bedtime as needed.  4. Right-sided chest wall pain: -Pain is not present every day.  Hence he is not requiring oxycodone every day.  5. Elevated blood sugar: -Last hemoglobin A1c was 5.3.  6. Weight loss: -He gained 4 pounds since last week.  He is eating better.  He is also drinking boost 3 to 4 cans/day.  7.  Depression: -Lexapro is helping his mood.  8.  Mucositis: -Continue Maalox and lidocaine for mucositis.   Orders placed this encounter:  No orders of the defined types were placed in this encounter.    [name], MD [place] [phone]   I, Milinda Antis, am acting as a scribe for Dr. Sanda Linger.  I, [name] MD, have reviewed the above documentation for accuracy and completeness, and I agree with the above.

## 2019-10-12 ENCOUNTER — Inpatient Hospital Stay (HOSPITAL_BASED_OUTPATIENT_CLINIC_OR_DEPARTMENT_OTHER): Payer: 59 | Admitting: Hematology

## 2019-10-12 ENCOUNTER — Inpatient Hospital Stay (HOSPITAL_COMMUNITY): Payer: 59

## 2019-10-12 ENCOUNTER — Other Ambulatory Visit: Payer: Self-pay

## 2019-10-12 VITALS — BP 133/79 | HR 86 | Temp 96.9°F | Resp 18 | Wt 227.2 lb

## 2019-10-12 VITALS — BP 131/82 | HR 75 | Resp 16

## 2019-10-12 DIAGNOSIS — Z5111 Encounter for antineoplastic chemotherapy: Secondary | ICD-10-CM | POA: Diagnosis not present

## 2019-10-12 DIAGNOSIS — C4922 Malignant neoplasm of connective and soft tissue of left lower limb, including hip: Secondary | ICD-10-CM

## 2019-10-12 DIAGNOSIS — Z95828 Presence of other vascular implants and grafts: Secondary | ICD-10-CM

## 2019-10-12 LAB — CBC WITH DIFFERENTIAL/PLATELET
Abs Immature Granulocytes: 0.3 10*3/uL — ABNORMAL HIGH (ref 0.00–0.07)
Basophils Absolute: 0.1 10*3/uL (ref 0.0–0.1)
Basophils Relative: 1 %
Eosinophils Absolute: 0.1 10*3/uL (ref 0.0–0.5)
Eosinophils Relative: 2 %
HCT: 39.3 % (ref 39.0–52.0)
Hemoglobin: 12.3 g/dL — ABNORMAL LOW (ref 13.0–17.0)
Immature Granulocytes: 5 %
Lymphocytes Relative: 14 %
Lymphs Abs: 0.9 10*3/uL (ref 0.7–4.0)
MCH: 31.2 pg (ref 26.0–34.0)
MCHC: 31.3 g/dL (ref 30.0–36.0)
MCV: 99.7 fL (ref 80.0–100.0)
Monocytes Absolute: 0.6 10*3/uL (ref 0.1–1.0)
Monocytes Relative: 9 %
Neutro Abs: 4.4 10*3/uL (ref 1.7–7.7)
Neutrophils Relative %: 69 %
Platelets: 313 10*3/uL (ref 150–400)
RBC: 3.94 MIL/uL — ABNORMAL LOW (ref 4.22–5.81)
RDW: 20 % — ABNORMAL HIGH (ref 11.5–15.5)
WBC: 6.4 10*3/uL (ref 4.0–10.5)
nRBC: 0 % (ref 0.0–0.2)

## 2019-10-12 LAB — COMPREHENSIVE METABOLIC PANEL
ALT: 36 U/L (ref 0–44)
AST: 35 U/L (ref 15–41)
Albumin: 3.6 g/dL (ref 3.5–5.0)
Alkaline Phosphatase: 128 U/L — ABNORMAL HIGH (ref 38–126)
Anion gap: 8 (ref 5–15)
BUN: 15 mg/dL (ref 6–20)
CO2: 25 mmol/L (ref 22–32)
Calcium: 8.8 mg/dL — ABNORMAL LOW (ref 8.9–10.3)
Chloride: 105 mmol/L (ref 98–111)
Creatinine, Ser: 0.76 mg/dL (ref 0.61–1.24)
GFR calc Af Amer: >60 mL/min (ref >60)
GFR calc non Af Amer: >60 mL/min (ref >60)
Glucose, Bld: 128 mg/dL — ABNORMAL HIGH (ref 70–99)
Potassium: 3.7 mmol/L (ref 3.5–5.1)
Sodium: 138 mmol/L (ref 135–145)
Total Bilirubin: 0.5 mg/dL (ref 0.3–1.2)
Total Protein: 6.3 g/dL — ABNORMAL LOW (ref 6.5–8.1)

## 2019-10-12 LAB — MAGNESIUM: Magnesium: 2.3 mg/dL (ref 1.7–2.4)

## 2019-10-12 LAB — LACTATE DEHYDROGENASE: LDH: 192 U/L (ref 98–192)

## 2019-10-12 MED ORDER — SODIUM CHLORIDE 0.9 % IV SOLN
Freq: Once | INTRAVENOUS | Status: AC
  Administered 2019-10-12: 8 mg via INTRAVENOUS
  Filled 2019-10-12: qty 4

## 2019-10-12 MED ORDER — HEPARIN SOD (PORK) LOCK FLUSH 100 UNIT/ML IV SOLN
500.0000 [IU] | Freq: Once | INTRAVENOUS | Status: AC | PRN
  Administered 2019-10-12: 500 [IU]

## 2019-10-12 MED ORDER — SODIUM CHLORIDE 0.9 % IV SOLN
720.0000 mg/m2 | Freq: Once | INTRAVENOUS | Status: AC
  Administered 2019-10-12: 1634 mg via INTRAVENOUS
  Filled 2019-10-12: qty 26.3

## 2019-10-12 MED ORDER — SODIUM CHLORIDE 0.9 % IV SOLN: Freq: Once | INTRAVENOUS | Status: AC

## 2019-10-12 MED ORDER — SODIUM CHLORIDE 0.9% FLUSH
10.0000 mL | INTRAVENOUS | Status: DC | PRN
  Administered 2019-10-12: 10 mL

## 2019-10-12 NOTE — Progress Notes (Signed)
[place], [place] [address]   CLINIC:  Medical Oncology/Hematology  PCP:  Celene Squibb, MD [address] [name] [address] [phone]   REASON FOR VISIT:  Follow-up for metastatic dedifferentiated liposarcoma   PRIOR THERAPY: Gemcitabine and docetaxel followed by wide local excision on 11/10/2018  NGS Results: Pending  CURRENT THERAPY: Gemcitabine and docetaxel 2 weeks on, 1 week off  BRIEF ONCOLOGIC HISTORY:  Oncology History  Liposarcoma of left lower extremity ([place])  04/25/2019 Initial Diagnosis   Liposarcoma of left lower extremity ([place])   05/22/2019 - 07/04/2019 Chemotherapy   The patient had DOXOrubicin (ADRIAMYCIN) chemo injection 174 mg, 75 mg/m2 = 174 mg, Intravenous,  Once, 3 of 6 cycles Administration: 174 mg (05/22/2019), 174 mg (06/13/2019), 174 mg (07/04/2019) palonosetron (ALOXI) injection 0.25 mg, 0.25 mg, Intravenous,  Once, 3 of 6 cycles Administration: 0.25 mg (05/22/2019), 0.25 mg (06/13/2019), 0.25 mg (07/04/2019) fosaprepitant (EMEND) 150 mg in sodium chloride 0.9 % 145 mL IVPB, 150 mg, Intravenous,  Once, 3 of 6 cycles Administration: 150 mg (05/22/2019), 150 mg (06/13/2019), 150 mg (07/04/2019)  for chemotherapy treatment.    08/11/2019 -  Chemotherapy   The patient had palonosetron (ALOXI) injection 0.25 mg, 0.25 mg, Intravenous,  Once, 3 of 4 cycles Administration: 0.25 mg (08/18/2019), 0.25 mg (09/07/2019), 0.25 mg (09/28/2019) ondansetron (ZOFRAN) injection 8 mg, 8 mg (100 % of original dose 8 mg), Intravenous,  Once, 3 of 3 cycles Dose modification: 8 mg (original dose 8 mg, Cycle 1) Administration: 8 mg (08/11/2019), 8 mg (08/31/2019), 8 mg (09/21/2019) pegfilgrastim (NEULASTA) injection 6 mg, 6 mg, Subcutaneous, Once, 1 of 1 cycle Administration: 6 mg (08/21/2019) pegfilgrastim (NEULASTA ONPRO KIT) injection 6 mg, 6 mg, Subcutaneous, Once, 3 of 4 cycles Administration: 6 mg (09/07/2019), 6 mg (09/28/2019) ondansetron  (ZOFRAN) 8 mg in sodium chloride 0.9 % 50 mL IVPB, , Intravenous,  Once, 0 of 1 cycle DOCEtaxel (TAXOTERE) 130 mg in sodium chloride 0.9 % 250 mL chemo infusion, 56.25 mg/m2 = 170 mg (75 % of original dose 100 mg/m2), Intravenous,  Once, 3 of 4 cycles Dose modification: 75 mg/m2 (75 % of original dose 100 mg/m2, Cycle 1, Reason: Provider Judgment), 75 mg/m2 (75 % of original dose 100 mg/m2, Cycle 2, Reason: Provider Judgment), 75 mg/m2 (original dose 100 mg/m2, Cycle 3, Reason: Provider Judgment), 50.9326 mg/m2 (66.7 % of original dose 100 mg/m2, Cycle 3, Reason: Other (see comments), Comment: neutropenia and elevated LFT) Administration: 130 mg (08/18/2019), 170 mg (09/07/2019), 150 mg (09/28/2019) gemcitabine (GEMZAR) 2,052 mg in sodium chloride 0.9 % 250 mL chemo infusion, 900 mg/m2 = 2,052 mg, Intravenous,  Once, 3 of 4 cycles Dose modification: 720 mg/m2 (80 % of original dose 900 mg/m2, Cycle 2, Reason: Provider Judgment) Administration: 2,052 mg (08/11/2019), 1,520 mg (08/18/2019), 1,634 mg (08/31/2019), 1,634 mg (09/07/2019), 1,634 mg (09/21/2019), 1,634 mg (09/28/2019)  for chemotherapy treatment.    Liposarcoma ([place])  05/15/2019 Initial Diagnosis   Liposarcoma ([place])   05/15/2019 Cancer Staging   Staging form: Primary Cutaneous B-Cell/T-Cell Lymphoma (Non-MF/SS Lymphoma), AJCC 8th Edition - Clinical stage from 05/15/2019: [name], pM1 - Signed by [name], MD on 05/15/2019     CANCER STAGING: Cancer Staging Liposarcoma [facility]) Staging form: Primary Cutaneous B-Cell/T-Cell Lymphoma (Non-MF/SS Lymphoma), AJCC 8th Edition - Clinical stage from 05/15/2019: [name], pM1 - Signed by [name], MD on 05/15/2019  Liposarcoma of left lower extremity [facility]) Staging form: Primary Cutaneous B-Cell/T-Cell  Lymphoma (Non-MF/SS Lymphoma), AJCC 8th Edition - Clinical: No stage assigned - Unsigned   INTERVAL HISTORY:  Mr. Wolf Boulay Sharron, a 59 y.o. male, returns for routine follow-up and  consideration for next cycle of chemotherapy. Tramel was last seen on 09/28/2019.  Due for cycle #4 of gemcitabine today.   Overall, he tells me he has been feeling pretty well. He tolerated the previous treatment well, though his appetite is decreased and he gets diarrhea for 1 week. His appetite and overall feeling improves on the tail-end of the second week after the chemo; he drinks 3-4 cans of Boost daily. He denies numbness or tingling. His pain is well controlled to the point that he does not have to take pain meds.  Overall, he feels ready for next cycle of chemo today.    REVIEW OF SYSTEMS:  Review of Systems  Constitutional: Positive for appetite change (50%) and fatigue (50%).  Respiratory: Positive for shortness of breath.   Gastrointestinal: Positive for constipation and diarrhea (week after chemo).  Psychiatric/Behavioral: Positive for sleep disturbance.  All other systems reviewed and are negative.   PAST MEDICAL/SURGICAL HISTORY:  Past Medical History:  Diagnosis Date  . Cancer ([place])   . Port-A-Cath in place 05/19/2019   right   Past Surgical History:  Procedure Laterality Date  . CHOLECYSTECTOMY    . ESOPHAGEAL DILATION N/A 06/27/2017   Procedure: ESOPHAGEAL DILATION;  Surgeon: Danie Binder, MD;  [facility];  Service: Endoscopy;  Laterality: N/A;  . ESOPHAGOGASTRODUODENOSCOPY N/A 06/27/2017   Procedure: ESOPHAGOGASTRODUODENOSCOPY (EGD);  Surgeon: Danie Binder, MD;  Location: [facility];  Service: Endoscopy;  Laterality: N/A;  . IR IMAGING GUIDED PORT INSERTION  05/04/2019  . knee sx      SOCIAL HISTORY:  Social History   Socioeconomic History  . Marital status: Married    Spouse name: Not on file  . Number of children: 1  . Years of education: Not on file  . Highest education level: Not on file  Occupational History  . Occupation: Unemployed  Tobacco Use  . Smoking status: Former Research scientist (life sciences)  . Smokeless tobacco: Never Used  Vaping Use    . Vaping Use: Never used  Substance and Sexual Activity  . Alcohol use: Yes    Comment: 1-2 drinks per month  . Drug use: Never  . Sexual activity: Not Currently  Other Topics Concern  . Not on file  Social History Narrative   DOES CONSTRUCTION. MARRIED. RARE ETOH. NO TOBACCO.   Social Determinants of Health   Financial Resource Strain: Medium Risk  . Difficulty of Paying Living Expenses: Somewhat hard  Food Insecurity: No Food Insecurity  . Worried About Charity fundraiser in the Last Year: Never true  . Ran Out of Food in the Last Year: Never true  Transportation Needs: No Transportation Needs  . Lack of Transportation (Medical): No  . Lack of Transportation (Non-Medical): No  Physical Activity: Inactive  . Days of Exercise per Week: 0 days  . Minutes of Exercise per Session: 0 min  Stress: Stress Concern Present  . Feeling of Stress : To some extent  Social Connections: Moderately Integrated  . Frequency of Communication with Friends and Family: More than three times a week  . Frequency of Social Gatherings with Friends and Family: Once a week  . Attends Religious Services: More than 4 times per year  . Active Member of Clubs or Organizations: No  . Attends Archivist  Meetings: Never  . Marital Status: Married  Human resources officer Violence: Not At Risk  . Fear of Current or Ex-Partner: No  . Emotionally Abused: No  . Physically Abused: No  . Sexually Abused: No    FAMILY HISTORY:  Family History  Problem Relation Age of Onset  . Heart disease Father   . Cancer Maternal Aunt   . Cancer Maternal Uncle   . Cancer Paternal Uncle   . Vision loss Maternal Grandfather   . Colon cancer Neg Hx   . Colon polyps Neg Hx     CURRENT MEDICATIONS:  Current Outpatient Medications  Medication Sig Dispense Refill  . ALPRAZolam (XANAX) 0.25 MG tablet Take 1 tablet (0.25 mg total) by mouth 2 (two) times daily as needed for anxiety. 60 tablet 0  . aluminum-magnesium  hydroxide-simethicone (MAALOX) 734-193-79 MG/5ML SUSP Swish and swallow 5 ml four times daily as needed for mouth sores. 600 mL 2  . DIPHENHIST 12.5 MG/5ML liquid SWISH AND SWALLOW ONETTEASPOONFUL 4 TIMES DAILYIAS NEEDED FOR MOUTH SORES.    . DOCEtaxel (TAXOTERE IV) Inject into the vein.    Marland Kitchen escitalopram (LEXAPRO) 10 MG tablet Take 1 tablet (10 mg total) by mouth daily. 30 tablet 2  . Gemcitabine HCl (GEMZAR IV) Inject into the vein.    Marland Kitchen HYDROcodone-homatropine (HYCODAN) 5-1.5 MG/5ML syrup SMARTSIG:1 Teaspoon By Mouth Every 6 Hours PRN 480 mL 0  . lidocaine (XYLOCAINE) 2 % solution SMARTSIG:1 Teaspoon By Mouth 4 Times Daily PRN    . Oxycodone HCl 10 MG TABS Take 0.5 tablets (5 mg total) by mouth every 8 (eight) hours as needed. 30 tablet 0   No current facility-administered medications for this visit.   Facility-Administered Medications Ordered in Other Visits  Medication Dose Route Frequency Provider Last Rate Last Admin  . sodium chloride flush (NS) 0.9 % injection 10 mL  10 mL Intravenous PRN [name], MD   10 mL at 08/21/19 1508    ALLERGIES:  No Known Allergies  PHYSICAL EXAM:  Performance status (ECOG): 1 - Symptomatic but completely ambulatory  Vitals:   10/12/19 0805  BP: 133/79  Pulse: 86  Resp: 18  Temp: (!) 96.9 F (36.1 C)  SpO2: 98%   Wt Readings from Last 3 Encounters:  10/12/19 227 lb 3.2 oz (103.1 kg)  09/28/19 226 lb 6.6 oz (102.7 kg)  09/21/19 222 lb 6.4 oz (100.9 kg)   Physical Exam Vitals reviewed.  Constitutional:      Appearance: Normal appearance. He is obese.  Cardiovascular:     Rate and Rhythm: Normal rate and regular rhythm.     Pulses: Normal pulses.     Heart sounds: Normal heart sounds.  Pulmonary:     Effort: Pulmonary effort is normal.     Breath sounds: Normal breath sounds.  Chest:     Comments: Port-a-Cath in R chest Abdominal:     Palpations: Abdomen is soft. There is no mass.     Tenderness: There is no abdominal  tenderness.  Musculoskeletal:     Right lower leg: No edema.     Left lower leg: No edema.  Neurological:     General: No focal deficit present.     Mental Status: He is alert and oriented to person, place, and time.  Psychiatric:        Mood and Affect: Mood normal.        Behavior: Behavior normal.     LABORATORY DATA:  I have reviewed the labs as  listed.  CBC Latest Ref Rng & Units 10/12/2019 09/28/2019 09/21/2019  WBC 4.0 - 10.5 K/uL 6.4 2.0(L) 6.2  Hemoglobin 13.0 - 17.0 g/dL 12.3(L) 11.5(L) 12.5(L)  Hematocrit 39 - 52 % 39.3 36.3(L) 40.0  Platelets 150 - 400 K/uL 313 248 316   CMP Latest Ref Rng & Units 10/12/2019 09/28/2019 09/21/2019  Glucose 70 - 99 mg/dL 128(H) 94 96  BUN 6 - 20 mg/dL 15 9 12   Creatinine 0.61 - 1.24 mg/dL 0.76 0.72 0.85  Sodium 135 - 145 mmol/L 138 136 138  Potassium 3.5 - 5.1 mmol/L 3.7 3.6 3.8  Chloride 98 - 111 mmol/L 105 105 104  CO2 22 - 32 mmol/L 25 23 24   Calcium 8.9 - 10.3 mg/dL 8.8(L) 8.9 9.1  Total Protein 6.5 - 8.1 g/dL 6.3(L) 6.5 6.8  Total Bilirubin 0.3 - 1.2 mg/dL 0.5 0.7 0.5  Alkaline Phos 38 - 126 U/L 128(H) 100 132(H)  AST 15 - 41 U/L 35 56(H) 37  ALT 0 - 44 U/L 36 69(H) 37   Lab Results  Component Value Date   LDH 192 10/12/2019   LDH 169 09/28/2019   LDH 204 (H) 09/21/2019    DIAGNOSTIC IMAGING:  I have independently reviewed the scans and discussed with the patient. No results found.   ASSESSMENT:  1. 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 -Chemoradiation therapy with gemcitabine and docetaxel followed by wide local excision. -Pathology on 11/10/2018 showed liposarcoma with 80% necrosis, grade 2, ypT4, NX, positive deep margin, amplification of MDM 2 detected. Amplification is associated with well-differentiated liposarcoma/atypical lipomatous tumors and dedifferentiated liposarcoma. -CT CAP in the ER  on 04/24/2019 showed widespread pulmonary metastatic lesions throughout both lungs, largest left upper lobe mass measuring 4.7 x 4.2 cm. Mass in the inferior lingula measures 4.12 x 3.2 cm. -Bone scan on 05/02/2019 did not show any evidence of metastatic disease. -2D echo on 05/02/2019 shows EF 60 to 65%. -Adriamycin every 3 weeks started on 05/22/2019. -CT CAP on 07/19/2019 showed dominant lung lesions have decreased in size. 2 nodules in the right major fissure have progressed while some other pulmonary nodules are unchanged. No evidence of metastatic disease in the abdomen or pelvis. -Gemcitabine and docetaxel every 21 days started on 08/11/2019.  2. Left kidney lesion: -CT on 04/24/2019 showed mass arising in the periphery of the upper lobe of the left kidney measuring 1.3 x 1.4 cm. -CT scan from [facility] in August 2020 was showing left kidney lesion. -Current scan on 07/19/2019 showed 15 mm hypoattenuating lesion in the upper pole left kidney stable since prior.   PLAN:  1. Metastatic dedifferentiated liposarcoma: -He had some nausea and some fatigue with slight diarrhea for the first 4 days. -He has been feeling very well in the last 4 days.  Reviewed labs.  Alk phos is slightly elevated at 128.  Bilirubin is normal.  Electrolytes and CBC are normal. -He will proceed with cycle 4-day 1 today.  He will come back next week for day 8 of treatment.  I plan to see him back in 3 weeks with a CT scan CAP.  2. Dry cough: -This has improved tremendously.  He is only taking Hycodan at bedtime for good sleep.  3. Anxiety: -Continue Xanax 0.25-0.5 mg at bedtime as needed.  4. Right-sided chest wall pain: -He is not requiring pain medicine oxycodone every day.  5. Elevated blood sugar: -  Today blood sugar is 128.  Last hemoglobin A1c is 5.3.  6. Weight loss: -He gained 1 pound since last 2 weeks.  He is also eating better in the last 4 to 5 days.  He is drinking boost 2 to  3 cans/day.  7. Depression: -Lexapro is helping his mood.  8. Mucositis: -Continue Maalox with viscous lidocaine as needed.   Orders placed this encounter:  Orders Placed This Encounter  Procedures  . CT CHEST ABDOMEN PELVIS W CONTRAST     [name], MD [place] [phone]   I, Milinda Antis, am acting as a scribe for Dr. Sanda Linger.  I, [name] MD, have reviewed the above documentation for accuracy and completeness, and I agree with the above.

## 2019-10-12 NOTE — Patient Instructions (Signed)
[facility] Discharge Instructions for Patients Receiving Chemotherapy   Beginning January 23rd 2017 lab work for the [facility] will be done in the  Main lab at [facility] on 1st floor. If you have a lab appointment with the [facility] please come in thru the  Main Entrance and check in at the main information desk   Today you received the following chemotherapy agents Gemzar  To help prevent nausea and vomiting after your treatment, we encourage you to take your nausea medication   If you develop nausea and vomiting, or diarrhea that is not controlled by your medication, call the clinic.  The clinic phone number is [phone]. Office hours are Monday-Friday 8:30am-5:00pm.  BELOW ARE SYMPTOMS THAT SHOULD BE REPORTED IMMEDIATELY:  *FEVER GREATER THAN 101.0 F  *CHILLS WITH OR WITHOUT FEVER  NAUSEA AND VOMITING THAT IS NOT CONTROLLED WITH YOUR NAUSEA MEDICATION  *UNUSUAL SHORTNESS OF BREATH  *UNUSUAL BRUISING OR BLEEDING  TENDERNESS IN MOUTH AND THROAT WITH OR WITHOUT PRESENCE OF ULCERS  *URINARY PROBLEMS  *BOWEL PROBLEMS  UNUSUAL RASH Items with * indicate a potential emergency and should be followed up as soon as possible. If you have an emergency after office hours please contact your primary care physician or go to the nearest emergency department.  Please call the clinic during office hours if you have any questions or concerns.   You may also contact the Patient Navigator at [phone] should you have any questions or need assistance in obtaining follow up care.      Resources For Cancer Patients and their Caregivers ? American Cancer Society: Can assist with transportation, wigs, general needs, runs Look Good Feel Better.        732-255-5934 ? Cancer Care: Provides financial assistance, online support groups, medication/co-pay assistance.  1-800-813-HOPE (252)353-9855) ? [place] Assists [place] cancer  patients and their families through emotional , educational and financial support.  (830) 795-1925 ? [place] DSS Where to apply for food stamps, Medicaid and utility assistance. 2396882633 ? [facility]: Transportation to medical appointments. 440-286-1602 ? Social Security Administration: May apply for disability if have a Stage IV cancer. 743-116-8188 (210)831-6781 ? [employer], Disability and Transit Services: Assists with nutrition, care and transit needs. (770)329-4434

## 2019-10-12 NOTE — Progress Notes (Signed)
Leonard [name] presents today for D1C4 Gemcitabine. Pt denies any new changes or symptoms since last treatment. Lab results and vitals have been reviewed and are stable and within parameters for treatment. Patient has been assessed by Dr. Delton Coombes who has approved proceeding with treatment today as planned.  Infusions tolerated without incident or complaint. VSS upon completion of treatment. Port flushed and deaccessed per protocol, see MAR and IV flowsheet for details. Discharged in satisfactory condition with follow up instructions.

## 2019-10-12 NOTE — Patient Instructions (Signed)
[facility] at [facility] Discharge Instructions  Labs drawn from portacath today   Thank you for choosing [facility] at [facility] to provide your oncology and hematology care.  To afford each patient quality time with our provider, please arrive at least 15 minutes before your scheduled appointment time.   If you have a lab appointment with the Cancer Center please come in thru the Main Entrance and check in at the main information desk.  You need to re-schedule your appointment should you arrive 10 or more minutes late.  We strive to give you quality time with our providers, and arriving late affects you and other patients whose appointments are after yours.  Also, if you no show three or more times for appointments you may be dismissed from the clinic at the providers discretion.     Again, thank you for choosing [facility].  Our hope is that these requests will decrease the amount of time that you wait before being seen by our physicians.       _____________________________________________________________  Should you have questions after your visit to  Cancer Center, please contact our office at [phone] and follow the prompts.  Our office hours are 8:00 a.m. and 4:30 p.m. Monday - Friday.  Please note that voicemails left after 4:00 p.m. may not be returned until the following business day.  We are closed weekends and major holidays.  You do have access to a nurse 24-7, just call the main number to the clinic [phone] and do not press any options, hold on the line and a nurse will answer the phone.    For prescription refill requests, have your pharmacy contact our office and allow 72 hours.    Due to Covid, you will need to wear a mask upon entering the hospital. If you do not have a mask, a mask will be given to you at the Main Entrance upon arrival. For doctor visits, patients may have 1 support person age 18  or older with them. For treatment visits, patients can not have anyone with them due to social distancing guidelines and our immunocompromised population.     

## 2019-10-12 NOTE — Progress Notes (Signed)
Patient was assessed by Dr. Delton Coombes and labs have been reviewed.  Alk phos 128, patient is okay to proceed with treatment today. Primary RN and pharmacy aware.

## 2019-10-12 NOTE — Patient Instructions (Addendum)
[place] at [facility] Discharge Instructions  You were seen today by Dr. Delton Coombes. He went over your recent results. You received your treatment today; continue receiving your weekly treatment. You will be scheduled for a CT scan of your chest and abdomen before your next visit. Dr. Delton Coombes will see you back in 3 weeks for labs and follow up.   Thank you for choosing [place] at [facility] to provide your oncology and hematology care.  To afford each patient quality time with our provider, please arrive at least 15 minutes before your scheduled appointment time.   If you have a lab appointment with the [facility] please come in thru the Main Entrance and check in at the main information desk  You need to re-schedule your appointment should you arrive 10 or more minutes late.  We strive to give you quality time with our providers, and arriving late affects you and other patients whose appointments are after yours.  Also, if you no show three or more times for appointments you may be dismissed from the clinic at the providers discretion.     Again, thank you for choosing [facility].  Our hope is that these requests will decrease the amount of time that you wait before being seen by our physicians.       _____________________________________________________________  Should you have questions after your visit to [facility], please contact our office at [phone] between the hours of 8:00 a.m. and 4:30 p.m.  Voicemails left after 4:00 p.m. will not be returned until the following business day.  For prescription refill requests, have your pharmacy contact our office and allow 72 hours.    Cancer Center Support Programs:   > Cancer Support Group  2nd Tuesday of the month 1pm-2pm, Journey Room

## 2019-10-15 ENCOUNTER — Other Ambulatory Visit (HOSPITAL_COMMUNITY): Payer: Self-pay

## 2019-10-16 MED ORDER — OXYCODONE HCL 10 MG PO TABS
5.0000 mg | ORAL_TABLET | Freq: Three times a day (TID) | ORAL | 0 refills | Status: DC | PRN
Start: 2019-10-16 — End: 2020-01-03

## 2019-10-19 ENCOUNTER — Inpatient Hospital Stay (HOSPITAL_COMMUNITY): Payer: 59 | Attending: Hematology

## 2019-10-19 ENCOUNTER — Ambulatory Visit (HOSPITAL_COMMUNITY): Payer: 59 | Admitting: Hematology

## 2019-10-19 ENCOUNTER — Inpatient Hospital Stay (HOSPITAL_BASED_OUTPATIENT_CLINIC_OR_DEPARTMENT_OTHER): Payer: 59

## 2019-10-19 ENCOUNTER — Other Ambulatory Visit: Payer: Self-pay

## 2019-10-19 ENCOUNTER — Encounter (HOSPITAL_COMMUNITY): Payer: Self-pay

## 2019-10-19 VITALS — BP 121/66 | HR 71 | Temp 97.5°F | Resp 18

## 2019-10-19 DIAGNOSIS — R059 Cough, unspecified: Secondary | ICD-10-CM | POA: Diagnosis not present

## 2019-10-19 DIAGNOSIS — Z5111 Encounter for antineoplastic chemotherapy: Secondary | ICD-10-CM | POA: Insufficient documentation

## 2019-10-19 DIAGNOSIS — Z79899 Other long term (current) drug therapy: Secondary | ICD-10-CM | POA: Insufficient documentation

## 2019-10-19 DIAGNOSIS — I2510 Atherosclerotic heart disease of native coronary artery without angina pectoris: Secondary | ICD-10-CM | POA: Diagnosis not present

## 2019-10-19 DIAGNOSIS — Z5189 Encounter for other specified aftercare: Secondary | ICD-10-CM | POA: Diagnosis not present

## 2019-10-19 DIAGNOSIS — Z95828 Presence of other vascular implants and grafts: Secondary | ICD-10-CM

## 2019-10-19 DIAGNOSIS — Z87891 Personal history of nicotine dependence: Secondary | ICD-10-CM | POA: Diagnosis not present

## 2019-10-19 DIAGNOSIS — C4922 Malignant neoplasm of connective and soft tissue of left lower limb, including hip: Secondary | ICD-10-CM

## 2019-10-19 DIAGNOSIS — R5383 Other fatigue: Secondary | ICD-10-CM | POA: Diagnosis not present

## 2019-10-19 DIAGNOSIS — J984 Other disorders of lung: Secondary | ICD-10-CM | POA: Diagnosis not present

## 2019-10-19 DIAGNOSIS — R739 Hyperglycemia, unspecified: Secondary | ICD-10-CM | POA: Insufficient documentation

## 2019-10-19 DIAGNOSIS — R197 Diarrhea, unspecified: Secondary | ICD-10-CM | POA: Diagnosis not present

## 2019-10-19 DIAGNOSIS — F419 Anxiety disorder, unspecified: Secondary | ICD-10-CM | POA: Insufficient documentation

## 2019-10-19 LAB — COMPREHENSIVE METABOLIC PANEL
ALT: 62 U/L — ABNORMAL HIGH (ref 0–44)
AST: 63 U/L — ABNORMAL HIGH (ref 15–41)
Albumin: 3.3 g/dL — ABNORMAL LOW (ref 3.5–5.0)
Alkaline Phosphatase: 83 U/L (ref 38–126)
Anion gap: 8 (ref 5–15)
BUN: 11 mg/dL (ref 6–20)
CO2: 23 mmol/L (ref 22–32)
Calcium: 8.6 mg/dL — ABNORMAL LOW (ref 8.9–10.3)
Chloride: 105 mmol/L (ref 98–111)
Creatinine, Ser: 0.71 mg/dL (ref 0.61–1.24)
GFR calc non Af Amer: >60 mL/min (ref >60)
Glucose, Bld: 125 mg/dL — ABNORMAL HIGH (ref 70–99)
Potassium: 3.5 mmol/L (ref 3.5–5.1)
Sodium: 136 mmol/L (ref 135–145)
Total Bilirubin: 0.9 mg/dL (ref 0.3–1.2)
Total Protein: 5.7 g/dL — ABNORMAL LOW (ref 6.5–8.1)

## 2019-10-19 LAB — LACTATE DEHYDROGENASE: LDH: 197 U/L — ABNORMAL HIGH (ref 98–192)

## 2019-10-19 LAB — CBC WITH DIFFERENTIAL/PLATELET
Abs Immature Granulocytes: 0.04 10*3/uL (ref 0.00–0.07)
Basophils Absolute: 0 10*3/uL (ref 0.0–0.1)
Basophils Relative: 2 %
Eosinophils Absolute: 0 10*3/uL (ref 0.0–0.5)
Eosinophils Relative: 1 %
HCT: 35 % — ABNORMAL LOW (ref 39.0–52.0)
Hemoglobin: 11.3 g/dL — ABNORMAL LOW (ref 13.0–17.0)
Immature Granulocytes: 2 %
Lymphocytes Relative: 21 %
Lymphs Abs: 0.5 10*3/uL — ABNORMAL LOW (ref 0.7–4.0)
MCH: 32.1 pg (ref 26.0–34.0)
MCHC: 32.3 g/dL (ref 30.0–36.0)
MCV: 99.4 fL (ref 80.0–100.0)
Monocytes Absolute: 0.5 10*3/uL (ref 0.1–1.0)
Monocytes Relative: 25 %
Neutro Abs: 1.1 10*3/uL — ABNORMAL LOW (ref 1.7–7.7)
Neutrophils Relative %: 49 %
Platelets: 233 10*3/uL (ref 150–400)
RBC: 3.52 MIL/uL — ABNORMAL LOW (ref 4.22–5.81)
RDW: 19.3 % — ABNORMAL HIGH (ref 11.5–15.5)
WBC: 2.2 10*3/uL — ABNORMAL LOW (ref 4.0–10.5)
nRBC: 0 % (ref 0.0–0.2)

## 2019-10-19 LAB — MAGNESIUM: Magnesium: 2.1 mg/dL (ref 1.7–2.4)

## 2019-10-19 MED ORDER — HEPARIN SOD (PORK) LOCK FLUSH 100 UNIT/ML IV SOLN
500.0000 [IU] | Freq: Once | INTRAVENOUS | Status: AC | PRN
  Administered 2019-10-19: 500 [IU]

## 2019-10-19 MED ORDER — PALONOSETRON HCL INJECTION 0.25 MG/5ML
0.2500 mg | Freq: Once | INTRAVENOUS | Status: AC
  Administered 2019-10-19: 0.25 mg via INTRAVENOUS
  Filled 2019-10-19: qty 5

## 2019-10-19 MED ORDER — PEGFILGRASTIM 6 MG/0.6ML ~~LOC~~ PSKT
6.0000 mg | PREFILLED_SYRINGE | Freq: Once | SUBCUTANEOUS | Status: AC
  Administered 2019-10-19: 6 mg via SUBCUTANEOUS

## 2019-10-19 MED ORDER — SODIUM CHLORIDE 0.9 % IV SOLN
720.0000 mg/m2 | Freq: Once | INTRAVENOUS | Status: AC
  Administered 2019-10-19: 1634 mg via INTRAVENOUS
  Filled 2019-10-19: qty 16.68

## 2019-10-19 MED ORDER — SODIUM CHLORIDE 0.9 % IV SOLN
66.6667 mg/m2 | Freq: Once | INTRAVENOUS | Status: AC
  Administered 2019-10-19: 150 mg via INTRAVENOUS
  Filled 2019-10-19: qty 15

## 2019-10-19 MED ORDER — SODIUM CHLORIDE 0.9 % IV SOLN: Freq: Once | INTRAVENOUS | Status: AC

## 2019-10-19 MED ORDER — SODIUM CHLORIDE 0.9% FLUSH
10.0000 mL | INTRAVENOUS | Status: DC | PRN
  Administered 2019-10-19: 10 mL

## 2019-10-19 MED ORDER — SODIUM CHLORIDE 0.9 % IV SOLN
10.0000 mg | Freq: Once | INTRAVENOUS | Status: AC
  Administered 2019-10-19: 10 mg via INTRAVENOUS
  Filled 2019-10-19: qty 10

## 2019-10-19 NOTE — Patient Instructions (Signed)
[facility] Discharge Instructions for Patients Receiving Chemotherapy  Today you received the following chemotherapy agents   To help prevent nausea and vomiting after your treatment, we encourage you to take your nausea medication   If you develop nausea and vomiting that is not controlled by your nausea medication, call the clinic.   BELOW ARE SYMPTOMS THAT SHOULD BE REPORTED IMMEDIATELY:  *FEVER GREATER THAN 100.5 F  *CHILLS WITH OR WITHOUT FEVER  NAUSEA AND VOMITING THAT IS NOT CONTROLLED WITH YOUR NAUSEA MEDICATION  *UNUSUAL SHORTNESS OF BREATH  *UNUSUAL BRUISING OR BLEEDING  TENDERNESS IN MOUTH AND THROAT WITH OR WITHOUT PRESENCE OF ULCERS  *URINARY PROBLEMS  *BOWEL PROBLEMS  UNUSUAL RASH Items with * indicate a potential emergency and should be followed up as soon as possible.  Feel free to call the clinic should you have any questions or concerns. The clinic phone number is [phone].  Please show the CHEMO ALERT CARD at check-in to the Emergency Department and triage nurse.   

## 2019-10-19 NOTE — Patient Instructions (Signed)
[facility] at [facility] Discharge Instructions  Labs drawn from portacath today   Thank you for choosing [facility] at [facility] to provide your oncology and hematology care.  To afford each patient quality time with our provider, please arrive at least 15 minutes before your scheduled appointment time.   If you have a lab appointment with the Cancer Center please come in thru the Main Entrance and check in at the main information desk.  You need to re-schedule your appointment should you arrive 10 or more minutes late.  We strive to give you quality time with our providers, and arriving late affects you and other patients whose appointments are after yours.  Also, if you no show three or more times for appointments you may be dismissed from the clinic at the providers discretion.     Again, thank you for choosing [facility].  Our hope is that these requests will decrease the amount of time that you wait before being seen by our physicians.       _____________________________________________________________  Should you have questions after your visit to [facility], please contact our office at [phone] and follow the prompts.  Our office hours are 8:00 a.m. and 4:30 p.m. Monday - Friday.  Please note that voicemails left after 4:00 p.m. may not be returned until the following business day.  We are closed weekends and major holidays.  You do have access to a nurse 24-7, just call the main number to the clinic [phone] and do not press any options, hold on the line and a nurse will answer the phone.    For prescription refill requests, have your pharmacy contact our office and allow 72 hours.    Due to Covid, you will need to wear a mask upon entering the hospital. If you do not have a mask, a mask will be given to you at the Main Entrance upon arrival. For doctor visits, patients may have 1 support person age 18  or older with them. For treatment visits, patients can not have anyone with them due to social distancing guidelines and our immunocompromised population.     

## 2019-10-19 NOTE — Progress Notes (Signed)
Pt here for treatment today. Pt has been having diarrhea.  Explained to try imodium.  Pt has no complaints of pain.  ANC 1.1. AST 63, ALT 62. Labs reviewed with Dr Raliegh Ip. ok to procedd. dose reduced docetaxel  tolerated treatment well without incidence.  Vital signs stable prior to discharge.  Discharged in stable condition ambulatory.    Marland KitchenElta Guadeloupe R [name] today for neulasta OBI placement per MD orders. OBI device filled per protocol and placed on left Upper Arm. Needle/catheter placement noted prior to patient leaving. Tolerated without incident and aware of injection to be delivered in  27 hours.

## 2019-10-19 NOTE — Progress Notes (Signed)
Keep docetaxel dose at 66.7mg /m2 today

## 2019-10-20 ENCOUNTER — Ambulatory Visit (HOSPITAL_COMMUNITY): Payer: 59

## 2019-10-20 NOTE — Progress Notes (Signed)
Nutrition Follow-up:  Patient with liposarcoma of left lower extremity.  Patient receiving gemcitabine and docetaxel.   Spoke with patient via phone for nutrition follow-up.  Patient reports that appetite is decrease during week of treatment, then picks back up.  Taste is maybe a little bit better.  Reports no mouth sores currently.  States that he is determined to eat more and not loose more weight.  Last night was able to eat stewed beef with gravy and loaf bread.  Breakfast was sausage and cheese muffin and lunch was chicken noodle soup with crackers.  Reports that he is drinking 2-4 equate shakes daily.  Likes the taste better than ensure or boost.   Medications: reviewed  Labs: reviewed  Anthropometrics:   Weight 234 lb 9.6 oz increased from 222 lb (9/9)   NUTRITION DIAGNOSIS: Inadequate oral intake improving   INTERVENTION:  Patient confirmed receiving handouts in the mail.   Encouraged patient to continue oral nutrition supplements Encouraged patient to continue high calorie, high protein foods to prevent further weight loss.      MONITORING, EVALUATION, GOAL: weight trends, intake   NEXT VISIT: November 12, phone f/u  Tzirel Leonor B. Zenia Resides, [place], [place] Registered Dietitian [phone] (mobile)

## 2019-10-30 ENCOUNTER — Other Ambulatory Visit (HOSPITAL_COMMUNITY): Payer: Self-pay

## 2019-10-30 DIAGNOSIS — C499 Malignant neoplasm of connective and soft tissue, unspecified: Secondary | ICD-10-CM

## 2019-10-30 MED ORDER — ALPRAZOLAM 0.25 MG PO TABS: 0.2500 mg | ORAL_TABLET | Freq: Two times a day (BID) | ORAL | 3 refills | Status: AC | PRN

## 2019-11-01 ENCOUNTER — Ambulatory Visit (HOSPITAL_COMMUNITY)
Admission: RE | Admit: 2019-11-01 | Discharge: 2019-11-01 | Disposition: A | Discharge status: Home / Self Care | Payer: 59 | Source: Ambulatory Visit | Attending: Hematology | Admitting: Hematology

## 2019-11-01 ENCOUNTER — Other Ambulatory Visit: Payer: Self-pay

## 2019-11-01 ENCOUNTER — Ambulatory Visit (HOSPITAL_COMMUNITY): Payer: 59

## 2019-11-01 DIAGNOSIS — C4922 Malignant neoplasm of connective and soft tissue of left lower limb, including hip: Secondary | ICD-10-CM | POA: Insufficient documentation

## 2019-11-01 MED ORDER — IOHEXOL 9 MG/ML PO SOLN
ORAL | Status: AC
  Filled 2019-11-01: qty 1000

## 2019-11-01 MED ORDER — IOHEXOL 300 MG/ML  SOLN
100.0000 mL | Freq: Once | INTRAMUSCULAR | Status: AC | PRN
  Administered 2019-11-01: 100 mL via INTRAVENOUS

## 2019-11-02 ENCOUNTER — Inpatient Hospital Stay (HOSPITAL_BASED_OUTPATIENT_CLINIC_OR_DEPARTMENT_OTHER): Payer: 59 | Admitting: Hematology

## 2019-11-02 ENCOUNTER — Inpatient Hospital Stay (HOSPITAL_COMMUNITY): Payer: 59

## 2019-11-02 VITALS — BP 110/77 | HR 92 | Temp 97.2°F | Resp 20 | Wt 229.8 lb

## 2019-11-02 VITALS — BP 115/67 | HR 79 | Resp 18

## 2019-11-02 DIAGNOSIS — Z5111 Encounter for antineoplastic chemotherapy: Secondary | ICD-10-CM | POA: Diagnosis not present

## 2019-11-02 DIAGNOSIS — C4922 Malignant neoplasm of connective and soft tissue of left lower limb, including hip: Secondary | ICD-10-CM

## 2019-11-02 DIAGNOSIS — Z95828 Presence of other vascular implants and grafts: Secondary | ICD-10-CM

## 2019-11-02 LAB — COMPREHENSIVE METABOLIC PANEL
ALT: 37 U/L (ref 0–44)
AST: 49 U/L — ABNORMAL HIGH (ref 15–41)
Albumin: 3.4 g/dL — ABNORMAL LOW (ref 3.5–5.0)
Alkaline Phosphatase: 115 U/L (ref 38–126)
Anion gap: 12 (ref 5–15)
BUN: 14 mg/dL (ref 6–20)
CO2: 23 mmol/L (ref 22–32)
Calcium: 8.5 mg/dL — ABNORMAL LOW (ref 8.9–10.3)
Chloride: 102 mmol/L (ref 98–111)
Creatinine, Ser: 0.81 mg/dL (ref 0.61–1.24)
GFR, Estimated: >60 mL/min (ref >60)
Glucose, Bld: 147 mg/dL — ABNORMAL HIGH (ref 70–99)
Potassium: 3.7 mmol/L (ref 3.5–5.1)
Sodium: 137 mmol/L (ref 135–145)
Total Bilirubin: 0.9 mg/dL (ref 0.3–1.2)
Total Protein: 5.9 g/dL — ABNORMAL LOW (ref 6.5–8.1)

## 2019-11-02 LAB — CBC WITH DIFFERENTIAL/PLATELET
Abs Immature Granulocytes: 0.2 10*3/uL — ABNORMAL HIGH (ref 0.00–0.07)
Basophils Absolute: 0.1 10*3/uL (ref 0.0–0.1)
Basophils Relative: 1 %
Eosinophils Absolute: 0 10*3/uL (ref 0.0–0.5)
Eosinophils Relative: 0 %
HCT: 37.3 % — ABNORMAL LOW (ref 39.0–52.0)
Hemoglobin: 11.9 g/dL — ABNORMAL LOW (ref 13.0–17.0)
Immature Granulocytes: 2 %
Lymphocytes Relative: 3 %
Lymphs Abs: 0.4 10*3/uL — ABNORMAL LOW (ref 0.7–4.0)
MCH: 31.6 pg (ref 26.0–34.0)
MCHC: 31.9 g/dL (ref 30.0–36.0)
MCV: 98.9 fL (ref 80.0–100.0)
Monocytes Absolute: 0.5 10*3/uL (ref 0.1–1.0)
Monocytes Relative: 5 %
Neutro Abs: 9.2 10*3/uL — ABNORMAL HIGH (ref 1.7–7.7)
Neutrophils Relative %: 89 %
Platelets: 307 10*3/uL (ref 150–400)
RBC: 3.77 MIL/uL — ABNORMAL LOW (ref 4.22–5.81)
RDW: 20.2 % — ABNORMAL HIGH (ref 11.5–15.5)
WBC: 10.3 10*3/uL (ref 4.0–10.5)
nRBC: 0 % (ref 0.0–0.2)

## 2019-11-02 MED ORDER — SODIUM CHLORIDE 0.9 % IV SOLN
Freq: Once | INTRAVENOUS | Status: AC
  Administered 2019-11-02: 8 mg via INTRAVENOUS
  Filled 2019-11-02: qty 4

## 2019-11-02 MED ORDER — SODIUM CHLORIDE 0.9% FLUSH
10.0000 mL | INTRAVENOUS | Status: DC | PRN
  Administered 2019-11-02: 10 mL

## 2019-11-02 MED ORDER — HEPARIN SOD (PORK) LOCK FLUSH 100 UNIT/ML IV SOLN
500.0000 [IU] | Freq: Once | INTRAVENOUS | Status: AC | PRN
  Administered 2019-11-02: 500 [IU]

## 2019-11-02 MED ORDER — SODIUM CHLORIDE 0.9 % IV SOLN
720.0000 mg/m2 | Freq: Once | INTRAVENOUS | Status: AC
  Administered 2019-11-02: 1634 mg via INTRAVENOUS
  Filled 2019-11-02: qty 27.2

## 2019-11-02 MED ORDER — SODIUM CHLORIDE 0.9 % IV SOLN: Freq: Once | INTRAVENOUS | Status: AC

## 2019-11-02 NOTE — Progress Notes (Signed)
[address] [place], [place] [address]   CLINIC:  Medical Oncology/Hematology  PCP:  Celene Squibb, MD [address] [name] [address] [phone]   REASON FOR VISIT:  Follow-up for metastatic dedifferentiated liposarcoma  PRIOR THERAPY: Gemcitabine and docetaxel followed by wide local excision on 11/10/2018  NGS Results: Pending  CURRENT THERAPY: Gemcitabine and docetaxel 2 weeks on, 1 week off  BRIEF ONCOLOGIC HISTORY:  Oncology History  Liposarcoma of left lower extremity ([place])  04/25/2019 Initial Diagnosis   Liposarcoma of left lower extremity ([place])   05/22/2019 - 07/04/2019 Chemotherapy   The patient had DOXOrubicin (ADRIAMYCIN) chemo injection 174 mg, 75 mg/m2 = 174 mg, Intravenous,  Once, 3 of 6 cycles Administration: 174 mg (05/22/2019), 174 mg (06/13/2019), 174 mg (07/04/2019) palonosetron (ALOXI) injection 0.25 mg, 0.25 mg, Intravenous,  Once, 3 of 6 cycles Administration: 0.25 mg (05/22/2019), 0.25 mg (06/13/2019), 0.25 mg (07/04/2019) fosaprepitant (EMEND) 150 mg in sodium chloride 0.9 % 145 mL IVPB, 150 mg, Intravenous,  Once, 3 of 6 cycles Administration: 150 mg (05/22/2019), 150 mg (06/13/2019), 150 mg (07/04/2019)  for chemotherapy treatment.    08/11/2019 -  Chemotherapy   The patient had palonosetron (ALOXI) injection 0.25 mg, 0.25 mg, Intravenous,  Once, 4 of 5 cycles Administration: 0.25 mg (08/18/2019), 0.25 mg (09/07/2019), 0.25 mg (09/28/2019), 0.25 mg (10/19/2019) ondansetron (ZOFRAN) injection 8 mg, 8 mg (100 % of original dose 8 mg), Intravenous,  Once, 3 of 3 cycles Dose modification: 8 mg (original dose 8 mg, Cycle 1) Administration: 8 mg (08/11/2019), 8 mg (08/31/2019), 8 mg (09/21/2019) pegfilgrastim (NEULASTA) injection 6 mg, 6 mg, Subcutaneous, Once, 1 of 1 cycle Administration: 6 mg (08/21/2019) pegfilgrastim (NEULASTA ONPRO KIT) injection 6 mg, 6 mg, Subcutaneous, Once, 4 of 5 cycles Administration: 6 mg (09/07/2019), 6 mg  (09/28/2019), 6 mg (10/19/2019) ondansetron (ZOFRAN) 8 mg in sodium chloride 0.9 % 50 mL IVPB, , Intravenous,  Once, 1 of 2 cycles Administration: 8 mg (10/12/2019) DOCEtaxel (TAXOTERE) 130 mg in sodium chloride 0.9 % 250 mL chemo infusion, 56.25 mg/m2 = 170 mg (75 % of original dose 100 mg/m2), Intravenous,  Once, 4 of 5 cycles Dose modification: 75 mg/m2 (75 % of original dose 100 mg/m2, Cycle 1, Reason: Provider Judgment), 75 mg/m2 (75 % of original dose 100 mg/m2, Cycle 2, Reason: Provider Judgment), 75 mg/m2 (original dose 100 mg/m2, Cycle 3, Reason: Provider Judgment), 68.3729 mg/m2 (66.7 % of original dose 100 mg/m2, Cycle 3, Reason: Other (see comments), Comment: neutropenia and elevated LFT), 66.6667 mg/m2 (66.7 % of original dose 100 mg/m2, Cycle 4, Reason: Change in LFTs) Administration: 130 mg (08/18/2019), 170 mg (09/07/2019), 150 mg (09/28/2019), 150 mg (10/19/2019) gemcitabine (GEMZAR) 2,052 mg in sodium chloride 0.9 % 250 mL chemo infusion, 900 mg/m2 = 2,052 mg, Intravenous,  Once, 4 of 5 cycles Dose modification: 720 mg/m2 (80 % of original dose 900 mg/m2, Cycle 2, Reason: Provider Judgment) Administration: 2,052 mg (08/11/2019), 1,520 mg (08/18/2019), 1,634 mg (08/31/2019), 1,634 mg (09/07/2019), 1,634 mg (09/21/2019), 1,634 mg (09/28/2019), 1,634 mg (10/12/2019), 1,634 mg (10/19/2019)  for chemotherapy treatment.    Liposarcoma ([place])  05/15/2019 Initial Diagnosis   Liposarcoma ([place])   05/15/2019 Cancer Staging   Staging form: Primary Cutaneous B-Cell/T-Cell Lymphoma (Non-MF/SS Lymphoma), AJCC 8th Edition - Clinical stage from 05/15/2019: [name], pM1 - Signed by [name], MD on 05/15/2019     CANCER STAGING: Cancer Staging Liposarcoma [facility]) Staging form: Primary Cutaneous B-Cell/T-Cell Lymphoma (  Non-MF/SS Lymphoma), AJCC 8th Edition - Clinical stage from 05/15/2019: [name], pM1 - Signed by [name], MD on 05/15/2019  Liposarcoma of left lower extremity [facility]) Staging  form: Primary Cutaneous B-Cell/T-Cell Lymphoma (Non-MF/SS Lymphoma), AJCC 8th Edition - Clinical: No stage assigned - Unsigned   INTERVAL HISTORY:  [name], a 59 y.o. male, returns for routine follow-up and consideration for next cycle of chemotherapy. Joban was last seen on 10/12/2019.  Due for cycle #5 of gemcitabine and docetaxel today.   Today he is accompanied by his wife. Overall, he tells me he has been feeling bad since the previous treatment. He reports that he had nausea and diarrhea after drinking the contrast dye and he slept poorly last night. He has been feeling fatigued and is not as active as before. His dry cough and chest pain have both improved. He denies numbness, tingling or N/V currently.  Overall, he feels ready for next cycle of chemo today.    REVIEW OF SYSTEMS:  Review of Systems  Constitutional: Positive for appetite change (25%) and fatigue (depleted).  Respiratory: Positive for cough (dry; improving) and shortness of breath.   Cardiovascular: Positive for chest pain (d/t cough; improving).  Gastrointestinal: Positive for diarrhea (d/t contrast dye). Negative for nausea and vomiting.  Neurological: Positive for dizziness and headaches. Negative for numbness.  Psychiatric/Behavioral: Positive for sleep disturbance.  All other systems reviewed and are negative.   PAST MEDICAL/SURGICAL HISTORY:  Past Medical History:  Diagnosis Date  . Cancer ([place])   . Port-A-Cath in place 05/19/2019   right   Past Surgical History:  Procedure Laterality Date  . CHOLECYSTECTOMY    . ESOPHAGEAL DILATION N/A 06/27/2017   Procedure: ESOPHAGEAL DILATION;  Surgeon: Danie Binder, MD;  Location: [facility] ENDO [facility];  Service: Endoscopy;  Laterality: N/A;  . ESOPHAGOGASTRODUODENOSCOPY N/A 06/27/2017   Procedure: ESOPHAGOGASTRODUODENOSCOPY (EGD);  Surgeon: Danie Binder, MD;  Location: [facility] ENDO SUITE;  Service: Endoscopy;  Laterality: N/A;  . IR IMAGING GUIDED PORT  INSERTION  05/04/2019  . knee sx      SOCIAL HISTORY:  Social History   Socioeconomic History  . Marital status: Married    Spouse name: Not on file  . Number of children: 1  . Years of education: Not on file  . Highest education level: Not on file  Occupational History  . Occupation: Unemployed  Tobacco Use  . Smoking status: Former Research scientist (life sciences)  . Smokeless tobacco: Never Used  Vaping Use  . Vaping Use: Never used  Substance and Sexual Activity  . Alcohol use: Yes    Comment: 1-2 drinks per month  . Drug use: Never  . Sexual activity: Not Currently  Other Topics Concern  . Not on file  Social History Narrative   DOES CONSTRUCTION. MARRIED. RARE ETOH. NO TOBACCO.   Social Determinants of Health   Financial Resource Strain: Medium Risk  . Difficulty of Paying Living Expenses: Somewhat hard  Food Insecurity: No Food Insecurity  . Worried About Charity fundraiser in the Last Year: Never true  . Ran Out of Food in the Last Year: Never true  Transportation Needs: No Transportation Needs  . Lack of Transportation (Medical): No  . Lack of Transportation (Non-Medical): No  Physical Activity: Inactive  . Days of Exercise per Week: 0 days  . Minutes of Exercise per Session: 0 min  Stress: Stress Concern Present  . Feeling of Stress : To some extent  Social Connections: Moderately Integrated  .  Frequency of Communication with Friends and Family: More than three times a week  . Frequency of Social Gatherings with Friends and Family: Once a week  . Attends Religious Services: More than 4 times per year  . Active Member of Clubs or Organizations: No  . Attends Archivist Meetings: Never  . Marital Status: Married  Human resources officer Violence: Not At Risk  . Fear of Current or Ex-Partner: No  . Emotionally Abused: No  . Physically Abused: No  . Sexually Abused: No    FAMILY HISTORY:  Family History  Problem Relation Age of Onset  . Heart disease Father   . Cancer  Maternal Aunt   . Cancer Maternal Uncle   . Cancer Paternal Uncle   . Vision loss Maternal Grandfather   . Colon cancer Neg Hx   . Colon polyps Neg Hx     CURRENT MEDICATIONS:  Current Outpatient Medications  Medication Sig Dispense Refill  . ALPRAZolam (XANAX) 0.25 MG tablet Take 1 tablet (0.25 mg total) by mouth 2 (two) times daily as needed for anxiety. 60 tablet 3  . aluminum-magnesium hydroxide-simethicone (MAALOX) 177-939-03 MG/5ML SUSP Swish and swallow 5 ml four times daily as needed for mouth sores. 600 mL 2  . DIPHENHIST 12.5 MG/5ML liquid SWISH AND SWALLOW ONETTEASPOONFUL 4 TIMES DAILYIAS NEEDED FOR MOUTH SORES.    . DOCEtaxel (TAXOTERE IV) Inject into the vein.    Marland Kitchen escitalopram (LEXAPRO) 10 MG tablet Take 1 tablet (10 mg total) by mouth daily. 30 tablet 2  . Gemcitabine HCl (GEMZAR IV) Inject into the vein.    Marland Kitchen HYDROcodone-homatropine (HYCODAN) 5-1.5 MG/5ML syrup SMARTSIG:1 Teaspoon By Mouth Every 6 Hours PRN 480 mL 0  . lidocaine (XYLOCAINE) 2 % solution SMARTSIG:1 Teaspoon By Mouth 4 Times Daily PRN    . Oxycodone HCl 10 MG TABS Take 0.5 tablets (5 mg total) by mouth every 8 (eight) hours as needed. 30 tablet 0   No current facility-administered medications for this visit.   Facility-Administered Medications Ordered in Other Visits  Medication Dose Route Frequency Provider Last Rate Last Admin  . sodium chloride flush (NS) 0.9 % injection 10 mL  10 mL Intravenous PRN [name], MD   10 mL at 08/21/19 1508    ALLERGIES:  No Known Allergies  PHYSICAL EXAM:  Performance status (ECOG): 1 - Symptomatic but completely ambulatory  Vitals:   11/02/19 0831  BP: 110/77  Pulse: 92  Resp: 20  Temp: (!) 97.2 F (36.2 C)  SpO2: 98%   Wt Readings from Last 3 Encounters:  11/02/19 229 lb 12.8 oz (104.2 kg)  10/19/19 234 lb 9.6 oz (106.4 kg)  10/12/19 227 lb 3.2 oz (103.1 kg)   Physical Exam Vitals reviewed.  Constitutional:      Appearance: Normal  appearance. He is obese.  Cardiovascular:     Rate and Rhythm: Normal rate and regular rhythm.     Pulses: Normal pulses.     Heart sounds: Normal heart sounds.  Pulmonary:     Effort: Pulmonary effort is normal.     Breath sounds: Normal breath sounds.  Chest:     Comments: Port-a-Cath in R chest Musculoskeletal:     Right lower leg: No edema.     Left lower leg: No edema.  Neurological:     General: No focal deficit present.     Mental Status: He is alert and oriented to person, place, and time.  Psychiatric:  Mood and Affect: Mood normal.        Behavior: Behavior normal.     LABORATORY DATA:  I have reviewed the labs as listed.  CBC Latest Ref Rng & Units 11/02/2019 10/19/2019 10/12/2019  WBC 4.0 - 10.5 K/uL 10.3 2.2(L) 6.4  Hemoglobin 13.0 - 17.0 g/dL 11.9(L) 11.3(L) 12.3(L)  Hematocrit 39 - 52 % 37.3(L) 35.0(L) 39.3  Platelets 150 - 400 K/uL 307 233 313   CMP Latest Ref Rng & Units 11/02/2019 10/19/2019 10/12/2019  Glucose 70 - 99 mg/dL 147(H) 125(H) 128(H)  BUN 6 - 20 mg/dL _0 Creatinine 0.61 - 1.24 mg/dL 0.81 0.71 0.76  Sodium 135 - 145 mmol/L 137 136 138  Potassium 3.5 - 5.1 mmol/L 3.7 3.5 3.7  Chloride 98 - 111 mmol/L 102 105 105  CO2 22 - 32 mmol/L _1 Calcium 8.9 - 10.3 mg/dL 8.5(L) 8.6(L) 8.8(L)  Total Protein 6.5 - 8.1 g/dL 5.9(L) 5.7(L) 6.3(L)  Total Bilirubin 0.3 - 1.2 mg/dL 0.9 0.9 0.5  Alkaline Phos 38 - 126 U/L 115 83 128(H)  AST 15 - 41 U/L 49(H) 63(H) 35  ALT 0 - 44 U/L 37 62(H) 36    DIAGNOSTIC IMAGING:  I have independently reviewed the scans and discussed with the patient. CT CHEST ABDOMEN PELVIS W CONTRAST  Result Date: 11/01/2019 CLINICAL DATA:  History of liposarcoma, follow-up de differentiated liposarcoma of the LEFT lower extremity. EXAM: CT CHEST, ABDOMEN, AND PELVIS WITH CONTRAST TECHNIQUE: Multidetector CT imaging of the chest, abdomen and pelvis was performed following the standard protocol during bolus administration  of intravenous contrast. CONTRAST:  179m OMNIPAQUE IOHEXOL 300 MG/ML  SOLN COMPARISON:  07/19/2019 FINDINGS: CT CHEST FINDINGS Cardiovascular: RIGHT-sided Port-A-Cath in situ terminates at the caval to atrial junction. Heart size is stable without pericardial effusion. Three-vessel coronary artery disease. Thoracic aorta is normal caliber with scattered atheromatous plaque. Central pulmonary vasculature unremarkable on venous phase assessment. Mediastinum/Nodes: No adenopathy in the chest. Lungs/Pleura: Numerous pulmonary lesions. A RIGHT lung lesion on image 62 of series 3 measuring 2 x 1.6 cm. Previously 1.5 x 1.4 cm. LEFT upper lobe lesion measuring 2.1 x 1.8 cm previously approximately 3.3 x 2.7 cm (image 31, series 3) Lingular lesion 2.2 x 2.3 cm (image 77, series 3) is previously 3.1 x 3.1 cm. Near complete resolution of small RIGHT upper lobe lesion in the medial RIGHT upper lobe with only bandlike area remaining on image 48 of series 3. This area previously measured 1.4 x 1.3 cm, currently 1 cm in length an approximately 3 mm short axis. RIGHT upper lobe lesion just above the fissure (image 93, series 3) 1.3 cm previously 2.6 cm. (Image 91, series 3) 6.8 x 4.3 cm lesion with low-density, previously approximately 7.3 by 2.8 cm. Tiny new lesion approximately 5 mm on image 73 of series 3 along the minor fissure. Musculoskeletal: New lytic focus with subacute fracture in the LEFT posterior fourth rib. Perhaps a subtle area on the most recent prior but no abnormality seen on the initial evaluation. CT ABDOMEN PELVIS FINDINGS Hepatobiliary: Hepatic steatosis. No focal hepatic lesion. Portal vein is patent. Post cholecystectomy without biliary duct dilation. Pancreas: Pancreas is normal without ductal dilation or sign of inflammation. Spleen: Spleen normal in size and contour without focal lesion. Adrenals/Urinary Tract: Adrenal glands are normal. Symmetric renal enhancement. No hydronephrosis. Stable,  approximately 1.5 cm low-density lesion in the interpolar LEFT kidney. Stomach/Bowel: No acute gastrointestinal process. Vascular/Lymphatic: Calcified atheromatous plaque of  the abdominal aorta without aneurysmal dilation. There is no gastrohepatic or hepatoduodenal ligament lymphadenopathy. No retroperitoneal or mesenteric lymphadenopathy. No pelvic sidewall lymphadenopathy. Reproductive: Prostate with mild heterogeneity, nonspecific on CT and unchanged. Other: No ascites. Musculoskeletal: Rib lesion as described above. 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 Subtle and unchanged. IMPRESSION: 1. On balance, interval improvement with respect to pulmonary metastatic disease but with new 5 mm nodule and with increase in size of 1 of the RIGHT upper lobe lesions and low attenuation along the major fissure in the RIGHT chest. Low attenuation along the major fissure in the RIGHT chest with shorter long axis dimension but with increase in overall volume as measured by this observer on the prior exam. 2. New lytic focus with subacute fracture in the LEFT posterior fourth rib. Perhaps a subtle area on the most recent prior but no abnormality seen on the initial evaluation. Attention on follow-up. 3. Hepatic steatosis. 4. Stable, approximately 1.5 cm low-density lesion in the interpolar LEFT kidney. This shows low-density but is mildly heterogeneous. Lesion is unchanged compared to previous studies, attention on follow-up. 5. 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 Subtle and unchanged. 6. Aortic atherosclerosis. Aortic Atherosclerosis (ICD10-I70.0). Electronically Signed   By: Zetta Bills M.D.   On: 11/01/2019 19:15     ASSESSMENT:  1. 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 -Chemoradiation therapy with gemcitabine and docetaxel followed by wide local  excision. -Pathology on 11/10/2018 showed liposarcoma with 80% necrosis, grade 2, ypT4, NX, positive deep margin, amplification of MDM 2 detected. Amplification is associated with well-differentiated liposarcoma/atypical lipomatous tumors and dedifferentiated liposarcoma. -CT CAP in the ER on 04/24/2019 showed widespread pulmonary metastatic lesions throughout both lungs, largest left upper lobe mass measuring 4.7 x 4.2 cm. Mass in the inferior lingula measures 4.12 x 3.2 cm. -Bone scan on 05/02/2019 did not show any evidence of metastatic disease. -2D echo on 05/02/2019 shows EF 60 to 65%. -Adriamycin every 3 weeks started on 05/22/2019. -CT CAP on 07/19/2019 showed dominant lung lesions have decreased in size. 2 nodules in the right major fissure have progressed while some other pulmonary nodules are unchanged. No evidence of metastatic disease in the abdomen or pelvis. -Gemcitabine and docetaxel every 21 days started on 08/11/2019. -CT CAP on 11/01/2019 showed interval improvement in the pulmonary metastatic disease but new 5 mm nodule in the right upper lobe.  Right lower lobe lesion has increased in volume.  Lytic focus in the left posterior fourth rib is slightly prominent.  Hepatic steatosis.  2. Left kidney lesion: -CT on 04/24/2019 showed mass arising in the periphery of the upper lobe of the left kidney measuring 1.3 x 1.4 cm. -CT scan from [facility] in August 2020 was showing left kidney lesion. -Current scan on 07/19/2019 showed 15 mm hypoattenuating lesion in the upper pole left kidney stable since prior.   PLAN:  1. Metastatic dedifferentiated liposarcoma: -We reviewed CT scan results today along with images.  Most of the lesions have improved in size.  One of the lesions has slightly increased in volume in the right lower lobe. -I have recommended consultation with radiation oncology for SBRT of the lung lesion. -We reviewed labs today which showed AST slightly elevated  at 49.  Albumin is 3.4. -He will proceed with his next cycle today. -RTC 3 weeks for follow-up.  2. Dry cough: -This has improved.  He  is only taking Hycodan at bedtime for good sleep.  3. Anxiety: -Continue Xanax at bedtime as needed.  4. Right-sided chest wall pain: -He is not requiring any pain medicine on a daily basis.  5. Elevated blood sugar: -His recent hemoglobin A1c was 5.3 despite his sugar being 147.  6. Weight loss: -He is continuing to gain weight lately.  He is drinking 2 to 3 cans of boost per day.  7. Depression: -Lexapro is helping his mood.  8. Mucositis: -Continue Maalox with viscous lidocaine as needed.   Orders placed this encounter:  No orders of the defined types were placed in this encounter.    [name], MD [place] [phone]   I, Milinda Antis, am acting as a scribe for Dr. Sanda Linger.  I, [name] MD, have reviewed the above documentation for accuracy and completeness, and I agree with the above.

## 2019-11-02 NOTE — Patient Instructions (Signed)
[facility] at [facility] Discharge Instructions  Labs drawn from portacath today   Thank you for choosing [facility] at [facility] to provide your oncology and hematology care.  To afford each patient quality time with our provider, please arrive at least 15 minutes before your scheduled appointment time.   If you have a lab appointment with the Cancer Center please come in thru the Main Entrance and check in at the main information desk.  You need to re-schedule your appointment should you arrive 10 or more minutes late.  We strive to give you quality time with our providers, and arriving late affects you and other patients whose appointments are after yours.  Also, if you no show three or more times for appointments you may be dismissed from the clinic at the providers discretion.     Again, thank you for choosing [facility].  Our hope is that these requests will decrease the amount of time that you wait before being seen by our physicians.       _____________________________________________________________  Should you have questions after your visit to [facility], please contact our office at [phone] and follow the prompts.  Our office hours are 8:00 a.m. and 4:30 p.m. Monday - Friday.  Please note that voicemails left after 4:00 p.m. may not be returned until the following business day.  We are closed weekends and major holidays.  You do have access to a nurse 24-7, just call the main number to the clinic [phone] and do not press any options, hold on the line and a nurse will answer the phone.    For prescription refill requests, have your pharmacy contact our office and allow 72 hours.    Due to Covid, you will need to wear a mask upon entering the hospital. If you do not have a mask, a mask will be given to you at the Main Entrance upon arrival. For doctor visits, patients may have 1 support person age 18  or older with them. For treatment visits, patients can not have anyone with them due to social distancing guidelines and our immunocompromised population.     

## 2019-11-02 NOTE — Progress Notes (Signed)
Leonard [name] presents today for D1C5 Gemzar. Pt denies any new changes or symptoms since last treatment. Lab results and vitals have been reviewed and are stable and within parameters for treatment. Patient has been assessed by Dr. Delton Coombes who has approved proceeding with treatment today as planned.  Infusions tolerated without incident or complaint. VSS upon completion of treatment. Port flushed and deaccessed per protocol, see MAR and IV flowsheet for details. Discharged in satisfactory condition with follow up instructions.

## 2019-11-02 NOTE — Patient Instructions (Signed)
[facility] Discharge Instructions for Patients Receiving Chemotherapy   Beginning January 23rd 2017 lab work for the [facility] will be done in the  Main lab at [facility] on 1st floor. If you have a lab appointment with the [facility] please come in thru the  Main Entrance and check in at the main information desk   Today you received the following chemotherapy agents Gemzar  To help prevent nausea and vomiting after your treatment, we encourage you to take your nausea medication    If you develop nausea and vomiting, or diarrhea that is not controlled by your medication, call the clinic.  The clinic phone number is [phone]. Office hours are Monday-Friday 8:30am-5:00pm.  BELOW ARE SYMPTOMS THAT SHOULD BE REPORTED IMMEDIATELY:  *FEVER GREATER THAN 101.0 F  *CHILLS WITH OR WITHOUT FEVER  NAUSEA AND VOMITING THAT IS NOT CONTROLLED WITH YOUR NAUSEA MEDICATION  *UNUSUAL SHORTNESS OF BREATH  *UNUSUAL BRUISING OR BLEEDING  TENDERNESS IN MOUTH AND THROAT WITH OR WITHOUT PRESENCE OF ULCERS  *URINARY PROBLEMS  *BOWEL PROBLEMS  UNUSUAL RASH Items with * indicate a potential emergency and should be followed up as soon as possible. If you have an emergency after office hours please contact your primary care physician or go to the nearest emergency department.  Please call the clinic during office hours if you have any questions or concerns.   You may also contact the Patient Navigator at [phone] should you have any questions or need assistance in obtaining follow up care.      Resources For Cancer Patients and their Caregivers ? American Cancer Society: Can assist with transportation, wigs, general needs, runs Look Good Feel Better.        (336) 495-2852 ? Cancer Care: Provides financial assistance, online support groups, medication/co-pay assistance.  1-800-813-HOPE 440 224 2483) ? [facility] Assists [place] cancer  patients and their families through emotional , educational and financial support.  (431)702-5386 ? [place] DSS Where to apply for food stamps, Medicaid and utility assistance. 4028471919 ? [facility]: Transportation to medical appointments. (302)609-5640 ? Social Security Administration: May apply for disability if have a Stage IV cancer. (609)566-4441 410-498-3251 ? [employer], Disability and Transit Services: Assists with nutrition, care and transit needs. (872)775-1795

## 2019-11-02 NOTE — Patient Instructions (Signed)
[place] at [facility] Discharge Instructions  You were seen today by Dr. Delton Coombes. He went over your recent results and scans. You received your treatment today; continue receiving your weekly treatment. You will be referred to radiation oncology for radiation of your right lung. Dr. Delton Coombes will see you back in 3 weeks for labs and follow up.   Thank you for choosing [place] at [facility] to provide your oncology and hematology care.  To afford each patient quality time with our provider, please arrive at least 15 minutes before your scheduled appointment time.   If you have a lab appointment with the [facility] please come in thru the Main Entrance and check in at the main information desk  You need to re-schedule your appointment should you arrive 10 or more minutes late.  We strive to give you quality time with our providers, and arriving late affects you and other patients whose appointments are after yours.  Also, if you no show three or more times for appointments you may be dismissed from the clinic at the providers discretion.     Again, thank you for choosing [facility].  Our hope is that these requests will decrease the amount of time that you wait before being seen by our physicians.       _____________________________________________________________  Should you have questions after your visit to [facility], please contact our office at [phone] between the hours of 8:00 a.m. and 4:30 p.m.  Voicemails left after 4:00 p.m. will not be returned until the following business day.  For prescription refill requests, have your pharmacy contact our office and allow 72 hours.    Cancer Center Support Programs:   > Cancer Support Group  2nd Tuesday of the month 1pm-2pm, Journey Room

## 2019-11-02 NOTE — Progress Notes (Signed)
Patient was assessed by Dr. Katragadda and labs have been reviewed.  Patient is okay to proceed with treatment today. Primary RN and pharmacy aware.   

## 2019-11-05 NOTE — Progress Notes (Signed)
Radiation Oncology         [phone] ________________________________  Initial Outpatient Consultation  Name: Leonard [name] MRN: [id number]  Date: 11/06/2019  DOB: 03/08/1960  VE:LFYB, Edwinna Areola, MD  [name], MD   REFERRING PHYSICIAN: [name], MD  DIAGNOSIS: The encounter diagnosis was Liposarcoma of left lower extremity ([place]).  Clinical stage cT1a, cNX, pM1 metastatic dedifferentiated liposarcoma  HISTORY OF PRESENT ILLNESS::Leonard [name] is a 59 y.o. male who is seen as a courtesy of Dr. Delton Coombes for an opinion concerning radiation therapy as part of management for his recently diagnosed metastatic liposarcoma. Today, he is accompanied by his wife. The patient presented to the ED on 04/24/2019 with complaints of nonproductive cough and shortness of breath with exertion. Chest x-ray at that time showed bilateral pulmonary nodules and masses indicative of metastatic disease in addition to a possible mild interstitial prominence that was of uncertain acuity. CTA of chest showed widespread pulmonary metastatic lesions throughout the lungs that involved most lobes in segments. The largest individual mass in the posterior segment of the left upper lobe abutted the pleura and measured 4.7 x 4.2 cm. There was also a mass in the inferior lingula that measured 4.1 x 3.2 cm in addition to several borderline prominent lymph nodes that were likely of neoplastic etiology. There was no edema, airspace opacity, demonstrable pulmonary embolus, thoracic aortic aneurysm, or dissection. CT scan of abdomen and pelvis showed a mass arising in the upper pole of the left kidney that measured 1.3 x 1.4 cm and had attenuation values high than expected with a cyst. Differential diagnoses included left renal metastasis versus small primary left renal carcinoma. There was no adenopathy in the abdomen or pelvis, no bowel wall thickening or bowel obstruction, no abscess in the abdomen or pelvis, no  renal or ureteral calculus, no hydronephrosis, and no focal liver lesions.  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 He underwent chemoradiation therapy with Gemcitabine and Docetaxel followed by wide local excision on 11/10/2018 at [facility]. Pathology from the procedure showed grade 2 liposarcoma with 80% necrosis, positive treatment effect, positive deep margin, and occasional 5 DM2 detected. Amplification was associated with well-differentiated liposarcoma/atypical lipomatous tumors and dedifferentiated liposarcoma. Unfortunately, he was lost during surveillance as he did not have insurance at that time.  The patient reports having several weeks of radiation therapy  Given the most recent findings, the patient was referred to Dr. Delton Coombes and was seen in consultation on 04/25/2019. At that time, it was recommended that he proceed with a bone scan, echocardiogram, and biopsy of the left upper lobe lung mass.  Bone scan on 05/02/2019 showed multi joint degenerative changes without evidence of bony metastatic disease. Echocardiogram on that same day showed an EF of 60-65%.  CT-guided biopsy of the left upper lobe mass performed on 05/08/2019 by Dr. Pascal Lux showed malignant cells consistent with metastatic high-grade sarcoma.  The patient underwent three cycles of chemotherapy with Adriamycin from 05/22/2019 to 07/04/2019 under the care of Dr. Delton Coombes. She tolerated treatment relatively well with the exception of fatigue, right-sided chest pain, and ongoing dry cough.  CT scan of chest/abdomen/pelvis on 07/19/2019 showed a mixed response to therapy. The dominant metastatic lung lesions had decreased in size and an anterior right upper lobe 13 mm nodule that was previously seen had essentially resolved. Unfortunately, some nodules, including along the right major fissure, had progressed while other pulmonary nodules were unchanged in the interval.  There was also  noted to be a stable 15 mm hypoattenuating lesion in the upper pole of the left kidney that was too high to be a simple cyst and while likely a cyst complicated by proteinaceous debris or hemorrhage, neoplasm was not excluded. There was no evidence of metastatic disease in the abdomen or pelvis.  Chemotherapy regimen was switched to Gemcitabine and Docetaxel on 08/11/2019 given the above progressions. He has since reported severely decreased appetite, fatigue, ongoing but stable cough, shortness of breath, nausea, diarrhea, and headaches.  Most recent CT scan of chest on 11/01/2019 showed interval improvement with respect to pulmonary metastatic disease, but with a new 5 mm nodule and with increase in size of one of the right upper lobe lesions and low attenuation along the major fissure in the right chest. There was also low attenuation along the major fissure in the right chest with shorter long axis dimension but with increase in overall volume. Additionally, there was a new lytic focus with a subacute fracture in the left posterior fourth rib. 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 Cancer ([place])   . Port-A-Cath in place 05/19/2019   right    PAST SURGICAL HISTORY: Past Surgical History:  Procedure Laterality Date  . CHOLECYSTECTOMY    . ESOPHAGEAL DILATION N/A 06/27/2017   Procedure: ESOPHAGEAL DILATION;  Surgeon: Danie Binder, MD;   Location: [facility];  Service: Endoscopy;  Laterality: N/A;  . ESOPHAGOGASTRODUODENOSCOPY N/A 06/27/2017   Procedure: ESOPHAGOGASTRODUODENOSCOPY (EGD);  Surgeon: Danie Binder, MD;  Location: [facility];  Service: Endoscopy;  Laterality: N/A;  . IR IMAGING GUIDED PORT INSERTION  05/04/2019  . knee sx      FAMILY HISTORY:  Family History  Problem Relation Age of Onset  . Heart disease Father   . Cancer Maternal Aunt   . Cancer Maternal Uncle   . Cancer Paternal Uncle   . Vision loss Maternal Grandfather   . Colon cancer Neg Hx   . Colon polyps Neg Hx     SOCIAL HISTORY:  Social History   Tobacco Use  . Smoking status: Former Research scientist (life sciences)  . Smokeless tobacco: Never Used  Vaping Use  . Vaping Use: Never used  Substance Use Topics  . Alcohol use: Yes    Comment: 1-2 drinks per month  . Drug use: Never    ALLERGIES: No Known Allergies  MEDICATIONS:  Current Outpatient Medications  Medication Sig Dispense Refill  . ALPRAZolam (XANAX) 0.25 MG tablet Take 1 tablet (0.25 mg total) by mouth 2 (two) times daily as needed for anxiety. 60 tablet 3  . aluminum-magnesium hydroxide-simethicone (MAALOX) 102-585-27 MG/5ML SUSP Swish and swallow 5 ml four times daily as needed for mouth sores. 600 mL 2  . DIPHENHIST 12.5 MG/5ML liquid SWISH AND SWALLOW ONETTEASPOONFUL 4 TIMES DAILYIAS NEEDED FOR MOUTH SORES.    . DOCEtaxel (TAXOTERE IV) Inject into the vein.    Marland Kitchen escitalopram (LEXAPRO)  10 MG tablet Take 1 tablet (10 mg total) by mouth daily. 30 tablet 2  . Gemcitabine HCl (GEMZAR IV) Inject into the vein.    Marland Kitchen HYDROcodone-homatropine (HYCODAN) 5-1.5 MG/5ML syrup SMARTSIG:1 Teaspoon By Mouth Every 6 Hours PRN 480 mL 0  . lidocaine (XYLOCAINE) 2 % solution SMARTSIG:1 Teaspoon By Mouth 4 Times Daily PRN    . Oxycodone HCl 10 MG TABS Take 0.5 tablets (5 mg total) by mouth every 8 (eight) hours as needed. 30 tablet 0   No current facility-administered medications for this encounter.    Facility-Administered Medications Ordered in Other Encounters  Medication Dose Route Frequency Provider Last Rate Last Admin  . sodium chloride flush (NS) 0.9 % injection 10 mL  10 mL Intravenous PRN [name], MD   10 mL at 08/21/19 1508    REVIEW OF SYSTEMS:  A 10+ POINT REVIEW OF SYSTEMS WAS OBTAINED including neurology, dermatology, psychiatry, cardiac, respiratory, lymph, extremities, GI, GU, musculoskeletal, constitutional, reproductive, HEENT.  Ongoing fatigue as above.  Patient denies any pain in the chest area significant cough or hemoptysis.  He denies any breathing difficulties except when wearing a mask   PHYSICAL EXAM:  height is 5\' 10"  (1.778 m) and weight is 232 lb (105.2 kg). His oral temperature is 98.1 F (36.7 C). His blood pressure is 109/78 and his pulse is 94. His respiration is 24 (abnormal) and oxygen saturation is 98%.   General: Alert and oriented, in no acute distress HEENT: Head is normocephalic. Extraocular movements are intact.  Neck: Neck is supple, no palpable cervical or supraclavicular lymphadenopathy. Heart: Regular in rate and rhythm with no murmurs, rubs, or gallops. Chest: Clear to auscultation bilaterally, with no rhonchi, wheezes, or rales. Abdomen: Soft, nontender, nondistended, with no rigidity or guarding. Extremities: No cyanosis or edema.  Surgical and radiation changes noted along the medial left calf region Lymphatics: see Neck Exam Skin: No concerning lesions. Musculoskeletal: symmetric strength and muscle tone throughout. Neurologic: Cranial nerves II through XII are grossly intact. No obvious focalities. Speech is fluent. Coordination is intact. Psychiatric: Judgment and insight are intact. Affect is appropriate.   ECOG = 1  0 - Asymptomatic (Fully active, able to carry on all predisease activities without restriction)  1 - Symptomatic but completely ambulatory (Restricted in physically strenuous activity but ambulatory and  able to carry out work of a light or sedentary nature. For example, light housework, office work)  2 - Symptomatic, <50% in bed during the day (Ambulatory and capable of all self care but unable to carry out any work activities. Up and about more than 50% of waking hours)  3 - Symptomatic, >50% in bed, but not bedbound (Capable of only limited self-care, confined to bed or chair 50% or more of waking hours)  4 - Bedbound (Completely disabled. Cannot carry on any self-care. Totally confined to bed or chair)  5 - Death   Eustace Pen MM, Creech RH, Tormey DC, et al. [phone]). "Toxicity and response criteria of the [facility]". [place] Oncol. 5 (6): 649-55  LABORATORY DATA:  Lab Results  Component Value Date   WBC 10.3 11/02/2019   HGB 11.9 (L) 11/02/2019   HCT 37.3 (L) 11/02/2019   MCV 98.9 11/02/2019   PLT 307 11/02/2019   NEUTROABS 9.2 (H) 11/02/2019   Lab Results  Component Value Date   NA 137 11/02/2019   K 3.7 11/02/2019   CL 102 11/02/2019   CO2 23 11/02/2019   GLUCOSE  147 (H) 11/02/2019   CREATININE 0.81 11/02/2019   CALCIUM 8.5 (L) 11/02/2019      RADIOGRAPHY: CT CHEST ABDOMEN PELVIS W CONTRAST  Result Date: 11/01/2019 CLINICAL DATA:  History of liposarcoma, follow-up de differentiated liposarcoma of the LEFT lower extremity. EXAM: CT CHEST, ABDOMEN, AND PELVIS WITH CONTRAST TECHNIQUE: Multidetector CT imaging of the chest, abdomen and pelvis was performed following the standard protocol during bolus administration of intravenous contrast. CONTRAST:  164mL OMNIPAQUE IOHEXOL 300 MG/ML  SOLN COMPARISON:  07/19/2019 FINDINGS: CT CHEST FINDINGS Cardiovascular: RIGHT-sided Port-A-Cath in situ terminates at the caval to atrial junction. Heart size is stable without pericardial effusion. Three-vessel coronary artery disease. Thoracic aorta is normal caliber with scattered atheromatous plaque. Central pulmonary vasculature unremarkable on venous phase assessment.  Mediastinum/Nodes: No adenopathy in the chest. Lungs/Pleura: Numerous pulmonary lesions. A RIGHT lung lesion on image 62 of series 3 measuring 2 x 1.6 cm. Previously 1.5 x 1.4 cm. LEFT upper lobe lesion measuring 2.1 x 1.8 cm previously approximately 3.3 x 2.7 cm (image 31, series 3) Lingular lesion 2.2 x 2.3 cm (image 77, series 3) is previously 3.1 x 3.1 cm. Near complete resolution of small RIGHT upper lobe lesion in the medial RIGHT upper lobe with only bandlike area remaining on image 48 of series 3. This area previously measured 1.4 x 1.3 cm, currently 1 cm in length an approximately 3 mm short axis. RIGHT upper lobe lesion just above the fissure (image 93, series 3) 1.3 cm previously 2.6 cm. (Image 91, series 3) 6.8 x 4.3 cm lesion with low-density, previously approximately 7.3 by 2.8 cm. Tiny new lesion approximately 5 mm on image 73 of series 3 along the minor fissure. Musculoskeletal: New lytic focus with subacute fracture in the LEFT posterior fourth rib. Perhaps a subtle area on the most recent prior but no abnormality seen on the initial evaluation. CT ABDOMEN PELVIS FINDINGS Hepatobiliary: Hepatic steatosis. No focal hepatic lesion. Portal vein is patent. Post cholecystectomy without biliary duct dilation. Pancreas: Pancreas is normal without ductal dilation or sign of inflammation. Spleen: Spleen normal in size and contour without focal lesion. Adrenals/Urinary Tract: Adrenal glands are normal. Symmetric renal enhancement. No hydronephrosis. Stable, approximately 1.5 cm low-density lesion in the interpolar LEFT kidney. Stomach/Bowel: No acute gastrointestinal process. Vascular/Lymphatic: Calcified atheromatous plaque of the abdominal aorta without aneurysmal dilation. There is no gastrohepatic or hepatoduodenal ligament lymphadenopathy. No retroperitoneal or mesenteric lymphadenopathy. No pelvic sidewall lymphadenopathy. Reproductive: Prostate with mild heterogeneity, nonspecific on CT and unchanged.  Other: No ascites. Musculoskeletal: Rib lesion as described above. 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 Subtle and unchanged. IMPRESSION: 1. On balance, interval improvement with respect to pulmonary metastatic disease but with new 5 mm nodule and with increase in size of 1 of the RIGHT upper lobe lesions and low attenuation along the major fissure in the RIGHT chest. Low attenuation along the major fissure in the RIGHT chest with shorter long axis dimension but with increase in overall volume as measured by this observer on the prior exam. 2. New lytic focus with subacute fracture in the LEFT posterior fourth rib. Perhaps a subtle area on the most recent prior but no abnormality seen on the initial evaluation. Attention on follow-up. 3. Hepatic steatosis. 4. Stable, approximately 1.5 cm low-density lesion in the interpolar LEFT kidney. This shows low-density but is mildly heterogeneous. Lesion is unchanged compared to previous studies, attention on follow-up. 5. 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 Subtle and unchanged. 6.  Aortic atherosclerosis. Aortic Atherosclerosis (ICD10-I70.0). Electronically Signed   By: Zetta Bills M.D.   On: 11/01/2019 19:15      IMPRESSION: Clinical stage cT1a, cNX, pM1 metastatic dedifferentiated liposarcoma  The patient's most recent chest CT scan shows a mixed response.  The dominant lesion in the right lower lobe has increased in volume (6.8 x 4.3 cm).  Given the size of this lesion and lack of response with chemotherapy I would recommend radiation treatments to this area.  Given the size of the lesion I am unsure whether he would be a candidate for stereotactic body radiation therapy but set the patient up with this approach.  If he is not a candidate for SBRT then we will proceed with hypofractionated accelerated radiation therapy over approximately 10 treatments.  Today, I talked to the patient and wife about the findings and work-up  thus far.  We discussed the natural history of liposarcoma and general treatment, highlighting the role of radiotherapy in the management.  We discussed the available radiation techniques, and focused on the details of logistics and delivery.  We reviewed the anticipated acute and late sequelae associated with radiation in this setting.  The patient was encouraged to ask questions that I answered to the best of my ability.  A patient consent form was discussed and signed.  We retained a copy for our records.  The patient would like to proceed with radiation and will be scheduled for CT simulation.  PLAN: The patient underwent simulation today.  He will begin his treatments in approximately a week that being SBRT or hypofractionated accelerated radiation therapy.  Total time spent in this encounter was 65 minutes which included reviewing the patient's most recent CT scans, bone scan, CT-guided biopsy, pathology report, consultations, follow-ups, chemotherapy, [facility] oncology history, physical examination, and documentation.   ------------------------------------------------  Blair Promise, PhD, MD  This document serves as a record of services personally performed by Gery Pray, MD. It was created on his behalf by Clerance Lav, a trained medical scribe. The creation of this record is based on the scribe's personal observations and the provider's statements to them. This document has been checked and approved by the attending provider.

## 2019-11-06 ENCOUNTER — Ambulatory Visit
Admission: RE | Admit: 2019-11-06 | Discharge: 2019-11-06 | Disposition: A | Discharge status: Home / Self Care | Payer: 59 | Source: Ambulatory Visit | Attending: Radiation Oncology | Admitting: Radiation Oncology

## 2019-11-06 ENCOUNTER — Encounter: Payer: Self-pay | Admitting: Radiation Oncology

## 2019-11-06 ENCOUNTER — Other Ambulatory Visit: Payer: Self-pay

## 2019-11-06 DIAGNOSIS — E119 Type 2 diabetes mellitus without complications: Secondary | ICD-10-CM | POA: Insufficient documentation

## 2019-11-06 DIAGNOSIS — R519 Headache, unspecified: Secondary | ICD-10-CM | POA: Insufficient documentation

## 2019-11-06 DIAGNOSIS — C7801 Secondary malignant neoplasm of right lung: Secondary | ICD-10-CM | POA: Insufficient documentation

## 2019-11-06 DIAGNOSIS — C4922 Malignant neoplasm of connective and soft tissue of left lower limb, including hip: Secondary | ICD-10-CM | POA: Diagnosis present

## 2019-11-06 DIAGNOSIS — C7800 Secondary malignant neoplasm of unspecified lung: Secondary | ICD-10-CM | POA: Diagnosis not present

## 2019-11-06 DIAGNOSIS — I2510 Atherosclerotic heart disease of native coronary artery without angina pectoris: Secondary | ICD-10-CM | POA: Insufficient documentation

## 2019-11-06 DIAGNOSIS — Z9221 Personal history of antineoplastic chemotherapy: Secondary | ICD-10-CM | POA: Insufficient documentation

## 2019-11-06 DIAGNOSIS — N2889 Other specified disorders of kidney and ureter: Secondary | ICD-10-CM | POA: Insufficient documentation

## 2019-11-06 DIAGNOSIS — K760 Fatty (change of) liver, not elsewhere classified: Secondary | ICD-10-CM | POA: Insufficient documentation

## 2019-11-06 DIAGNOSIS — R059 Cough, unspecified: Secondary | ICD-10-CM | POA: Insufficient documentation

## 2019-11-06 DIAGNOSIS — Z87891 Personal history of nicotine dependence: Secondary | ICD-10-CM | POA: Insufficient documentation

## 2019-11-06 DIAGNOSIS — C3412 Malignant neoplasm of upper lobe, left bronchus or lung: Secondary | ICD-10-CM | POA: Insufficient documentation

## 2019-11-06 DIAGNOSIS — Z923 Personal history of irradiation: Secondary | ICD-10-CM | POA: Insufficient documentation

## 2019-11-06 DIAGNOSIS — R0602 Shortness of breath: Secondary | ICD-10-CM | POA: Insufficient documentation

## 2019-11-06 DIAGNOSIS — R197 Diarrhea, unspecified: Secondary | ICD-10-CM | POA: Insufficient documentation

## 2019-11-06 DIAGNOSIS — Z809 Family history of malignant neoplasm, unspecified: Secondary | ICD-10-CM | POA: Insufficient documentation

## 2019-11-06 DIAGNOSIS — R110 Nausea: Secondary | ICD-10-CM | POA: Insufficient documentation

## 2019-11-06 DIAGNOSIS — R0789 Other chest pain: Secondary | ICD-10-CM | POA: Insufficient documentation

## 2019-11-06 NOTE — Progress Notes (Signed)
Patient here for a consult with Dr. Sondra Come.  Thoracic Location of Tumor: bil lungs   Tobacco/Marijuana/Snuff/ETOH use: past hx  Past/Anticipated interventions by cardiothoracic surgery, if any: none  Past/Anticipated interventions by medical oncology, if any: currently doing chemo  Signs/Symptoms  Weight changes, if any: varies up and down  Respiratory complaints, if any: shortness of breath with exertion  Hemoptysis, if any: July  Pain issues, if any:  none  SAFETY ISSUES:  Prior radiation? Yes, left leg Aug- Oct 2020  Pacemaker/ICD? no  Possible current pregnancy?n/a  Is the patient on methotrexate? no  Current Complaints / other details:    BP 109/78 (BP Location: Right Arm, Patient Position: Sitting, Cuff Size: Large)   Pulse 94   Temp 98.1 F (36.7 C) (Oral)   Resp (!) 24   Ht 5\' 10"  (1.778 m)   Wt 232 lb (105.2 kg)   SpO2 98%   BMI 33.29 kg/m    Wt Readings from Last 3 Encounters:  11/06/19 232 lb (105.2 kg)  11/02/19 229 lb 12.8 oz (104.2 kg)  10/19/19 234 lb 9.6 oz (106.4 kg)

## 2019-11-07 ENCOUNTER — Encounter: Payer: Self-pay | Admitting: General Practice

## 2019-11-07 NOTE — Progress Notes (Signed)
[facility] Psychosocial Distress Screening Clinical Social Work  Clinical Social Work was referred by distress screening protocol.  The patient scored a 5 on the Psychosocial Distress Thermometer which indicates mild distress. Clinical Social Worker did not contact patient to assess for distress and other psychosocial needs. Per screen, patient declined social work contact.  Please reconsult SW if needed in future.   ONCBCN DISTRESS SCREENING 11/06/2019  Screening Type Initial Screening  Distress experienced in past week (1-10) 5  Referral to clinical social work No    Clinical Social Worker follow up needed: No.  If yes, follow up plan:  Beverely Pace, [place], LCSW Clinical Social Worker Phone:  [phone]

## 2019-11-08 ENCOUNTER — Other Ambulatory Visit (HOSPITAL_COMMUNITY): Payer: Self-pay | Admitting: *Deleted

## 2019-11-08 DIAGNOSIS — C499 Malignant neoplasm of connective and soft tissue, unspecified: Secondary | ICD-10-CM

## 2019-11-08 MED ORDER — HYDROCODONE-HOMATROPINE 5-1.5 MG/5ML PO SYRP: ORAL_SOLUTION | ORAL | 0 refills | Status: DC

## 2019-11-09 ENCOUNTER — Inpatient Hospital Stay (HOSPITAL_COMMUNITY): Payer: 59

## 2019-11-09 ENCOUNTER — Encounter (HOSPITAL_COMMUNITY): Payer: Self-pay

## 2019-11-09 ENCOUNTER — Other Ambulatory Visit: Payer: Self-pay

## 2019-11-09 VITALS — BP 125/84 | HR 78 | Resp 18

## 2019-11-09 DIAGNOSIS — C4922 Malignant neoplasm of connective and soft tissue of left lower limb, including hip: Secondary | ICD-10-CM

## 2019-11-09 DIAGNOSIS — Z5111 Encounter for antineoplastic chemotherapy: Secondary | ICD-10-CM | POA: Diagnosis not present

## 2019-11-09 DIAGNOSIS — Z95828 Presence of other vascular implants and grafts: Secondary | ICD-10-CM

## 2019-11-09 LAB — COMPREHENSIVE METABOLIC PANEL
ALT: 48 U/L — ABNORMAL HIGH (ref 0–44)
AST: 49 U/L — ABNORMAL HIGH (ref 15–41)
Albumin: 3 g/dL — ABNORMAL LOW (ref 3.5–5.0)
Alkaline Phosphatase: 85 U/L (ref 38–126)
Anion gap: 8 (ref 5–15)
BUN: 13 mg/dL (ref 6–20)
CO2: 24 mmol/L (ref 22–32)
Calcium: 8.3 mg/dL — ABNORMAL LOW (ref 8.9–10.3)
Chloride: 104 mmol/L (ref 98–111)
Creatinine, Ser: 0.78 mg/dL (ref 0.61–1.24)
GFR, Estimated: >60 mL/min (ref >60)
Glucose, Bld: 122 mg/dL — ABNORMAL HIGH (ref 70–99)
Potassium: 3.7 mmol/L (ref 3.5–5.1)
Sodium: 136 mmol/L (ref 135–145)
Total Bilirubin: 0.5 mg/dL (ref 0.3–1.2)
Total Protein: 5.5 g/dL — ABNORMAL LOW (ref 6.5–8.1)

## 2019-11-09 LAB — CBC WITH DIFFERENTIAL/PLATELET
Abs Immature Granulocytes: 0.01 10*3/uL (ref 0.00–0.07)
Basophils Absolute: 0 10*3/uL (ref 0.0–0.1)
Basophils Relative: 2 %
Eosinophils Absolute: 0 10*3/uL (ref 0.0–0.5)
Eosinophils Relative: 1 %
HCT: 33.1 % — ABNORMAL LOW (ref 39.0–52.0)
Hemoglobin: 10.8 g/dL — ABNORMAL LOW (ref 13.0–17.0)
Immature Granulocytes: 1 %
Lymphocytes Relative: 17 %
Lymphs Abs: 0.3 10*3/uL — ABNORMAL LOW (ref 0.7–4.0)
MCH: 32.5 pg (ref 26.0–34.0)
MCHC: 32.6 g/dL (ref 30.0–36.0)
MCV: 99.7 fL (ref 80.0–100.0)
Monocytes Absolute: 0.4 10*3/uL (ref 0.1–1.0)
Monocytes Relative: 23 %
Neutro Abs: 1.1 10*3/uL — ABNORMAL LOW (ref 1.7–7.7)
Neutrophils Relative %: 56 %
Platelets: 215 10*3/uL (ref 150–400)
RBC: 3.32 MIL/uL — ABNORMAL LOW (ref 4.22–5.81)
RDW: 18.9 % — ABNORMAL HIGH (ref 11.5–15.5)
WBC: 1.9 10*3/uL — ABNORMAL LOW (ref 4.0–10.5)
nRBC: 0 % (ref 0.0–0.2)

## 2019-11-09 MED ORDER — SODIUM CHLORIDE 0.9 % IV SOLN
720.0000 mg/m2 | Freq: Once | INTRAVENOUS | Status: AC
  Administered 2019-11-09: 1634 mg via INTRAVENOUS
  Filled 2019-11-09: qty 26.3

## 2019-11-09 MED ORDER — PALONOSETRON HCL INJECTION 0.25 MG/5ML
INTRAVENOUS | Status: AC
  Filled 2019-11-09: qty 5

## 2019-11-09 MED ORDER — PALONOSETRON HCL INJECTION 0.25 MG/5ML
0.2500 mg | Freq: Once | INTRAVENOUS | Status: AC
  Administered 2019-11-09: 0.25 mg via INTRAVENOUS

## 2019-11-09 MED ORDER — SODIUM CHLORIDE 0.9 % IV SOLN: Freq: Once | INTRAVENOUS | Status: AC

## 2019-11-09 MED ORDER — PEGFILGRASTIM 6 MG/0.6ML ~~LOC~~ PSKT
6.0000 mg | PREFILLED_SYRINGE | Freq: Once | SUBCUTANEOUS | Status: AC
  Administered 2019-11-09: 6 mg via SUBCUTANEOUS

## 2019-11-09 MED ORDER — PEGFILGRASTIM 6 MG/0.6ML ~~LOC~~ PSKT
PREFILLED_SYRINGE | SUBCUTANEOUS | Status: AC
  Filled 2019-11-09: qty 0.6

## 2019-11-09 MED ORDER — SODIUM CHLORIDE 0.9 % IV SOLN
10.0000 mg | Freq: Once | INTRAVENOUS | Status: AC
  Administered 2019-11-09: 10 mg via INTRAVENOUS
  Filled 2019-11-09: qty 10

## 2019-11-09 MED ORDER — SODIUM CHLORIDE 0.9% FLUSH
10.0000 mL | INTRAVENOUS | Status: DC | PRN
  Administered 2019-11-09: 10 mL

## 2019-11-09 MED ORDER — SODIUM CHLORIDE 0.9 % IV SOLN
66.6667 mg/m2 | Freq: Once | INTRAVENOUS | Status: AC
  Administered 2019-11-09: 150 mg via INTRAVENOUS
  Filled 2019-11-09: qty 15

## 2019-11-09 MED ORDER — HEPARIN SOD (PORK) LOCK FLUSH 100 UNIT/ML IV SOLN
500.0000 [IU] | Freq: Once | INTRAVENOUS | Status: AC | PRN
  Administered 2019-11-09: 500 [IU]

## 2019-11-09 NOTE — Patient Instructions (Signed)
[facility] Discharge Instructions  Labs drawn from portacath today   Thank you for choosing  [facility] at [facility] to provide your oncology and hematology care.  To afford each patient quality time with our provider, please arrive at least 15 minutes before your scheduled appointment time.   If you have a lab appointment with the Cancer Center please come in thru the Main Entrance and check in at the main information desk.  You need to re-schedule your appointment should you arrive 10 or more minutes late.  We strive to give you quality time with our providers, and arriving late affects you and other patients whose appointments are after yours.  Also, if you no show three or more times for appointments you may be dismissed from the clinic at the providers discretion.     Again, thank you for choosing [facility].  Our hope is that these requests will decrease the amount of time that you wait before being seen by our physicians.       _____________________________________________________________  Should you have questions after your visit to [facility], please contact our office at [phone] and follow the prompts.  Our office hours are 8:00 a.m. and 4:30 p.m. Monday - Friday.  Please note that voicemails left after 4:00 p.m. may not be returned until the following business day.  We are closed weekends and major holidays.  You do have access to a nurse 24-7, just call the main number to the clinic [phone] and do not press any options, hold on the line and a nurse will answer the phone.    For prescription refill requests, have your pharmacy contact our office and allow 72 hours.    Due to Covid, you will need to wear a mask upon entering the hospital. If you do not have a mask, a mask will be given to you at the Main Entrance upon arrival. For doctor visits, patients may have 1 support person age 18  or older with them. For treatment visits, patients can not have anyone with them due to social distancing guidelines and our immunocompromised population.     

## 2019-11-09 NOTE — Progress Notes (Signed)
Assessment findings and labs reviewed with Dr. Delton Coombes - MD aware of ANC 1.1.  Okay to proceed with tx today per MD.  Tolerated infusions w/o adverse reaction.  Alert, in no distress.  VSS.  Discharged ambulatory in c/o spouse.

## 2019-11-10 ENCOUNTER — Other Ambulatory Visit (HOSPITAL_COMMUNITY): Payer: Self-pay

## 2019-11-10 DIAGNOSIS — C499 Malignant neoplasm of connective and soft tissue, unspecified: Secondary | ICD-10-CM

## 2019-11-10 MED ORDER — HYDROCODONE-HOMATROPINE 5-1.5 MG/5ML PO SYRP: ORAL_SOLUTION | ORAL | 0 refills | Status: DC

## 2019-11-13 DIAGNOSIS — K1230 Oral mucositis (ulcerative), unspecified: Secondary | ICD-10-CM | POA: Diagnosis not present

## 2019-11-13 DIAGNOSIS — Z87891 Personal history of nicotine dependence: Secondary | ICD-10-CM | POA: Diagnosis not present

## 2019-11-13 DIAGNOSIS — C499 Malignant neoplasm of connective and soft tissue, unspecified: Secondary | ICD-10-CM | POA: Insufficient documentation

## 2019-11-13 DIAGNOSIS — Z79899 Other long term (current) drug therapy: Secondary | ICD-10-CM | POA: Diagnosis not present

## 2019-11-13 DIAGNOSIS — R197 Diarrhea, unspecified: Secondary | ICD-10-CM | POA: Diagnosis not present

## 2019-11-13 DIAGNOSIS — D649 Anemia, unspecified: Secondary | ICD-10-CM | POA: Diagnosis not present

## 2019-11-13 DIAGNOSIS — F418 Other specified anxiety disorders: Secondary | ICD-10-CM | POA: Diagnosis not present

## 2019-11-13 DIAGNOSIS — R630 Anorexia: Secondary | ICD-10-CM | POA: Diagnosis not present

## 2019-11-13 DIAGNOSIS — R0789 Other chest pain: Secondary | ICD-10-CM | POA: Diagnosis not present

## 2019-11-13 DIAGNOSIS — R5383 Other fatigue: Secondary | ICD-10-CM | POA: Diagnosis not present

## 2019-11-13 DIAGNOSIS — R110 Nausea: Secondary | ICD-10-CM | POA: Diagnosis not present

## 2019-11-13 DIAGNOSIS — C7652 Malignant neoplasm of left lower limb: Secondary | ICD-10-CM | POA: Diagnosis present

## 2019-11-13 DIAGNOSIS — R739 Hyperglycemia, unspecified: Secondary | ICD-10-CM | POA: Diagnosis not present

## 2019-11-13 DIAGNOSIS — E669 Obesity, unspecified: Secondary | ICD-10-CM | POA: Diagnosis not present

## 2019-11-13 DIAGNOSIS — E860 Dehydration: Secondary | ICD-10-CM | POA: Diagnosis not present

## 2019-11-13 DIAGNOSIS — Z9221 Personal history of antineoplastic chemotherapy: Secondary | ICD-10-CM | POA: Diagnosis not present

## 2019-11-13 DIAGNOSIS — C4922 Malignant neoplasm of connective and soft tissue of left lower limb, including hip: Secondary | ICD-10-CM | POA: Diagnosis not present

## 2019-11-13 DIAGNOSIS — C7800 Secondary malignant neoplasm of unspecified lung: Secondary | ICD-10-CM | POA: Diagnosis not present

## 2019-11-13 DIAGNOSIS — R059 Cough, unspecified: Secondary | ICD-10-CM | POA: Diagnosis not present

## 2019-11-13 DIAGNOSIS — R634 Abnormal weight loss: Secondary | ICD-10-CM | POA: Diagnosis not present

## 2019-11-14 ENCOUNTER — Ambulatory Visit
Admission: RE | Admit: 2019-11-14 | Discharge: 2019-11-14 | Disposition: A | Discharge status: Home / Self Care | Payer: 59 | Source: Ambulatory Visit | Attending: Radiation Oncology | Admitting: Radiation Oncology

## 2019-11-14 ENCOUNTER — Other Ambulatory Visit: Payer: Self-pay

## 2019-11-14 DIAGNOSIS — C7652 Malignant neoplasm of left lower limb: Secondary | ICD-10-CM | POA: Diagnosis not present

## 2019-11-14 DIAGNOSIS — C4922 Malignant neoplasm of connective and soft tissue of left lower limb, including hip: Secondary | ICD-10-CM

## 2019-11-15 ENCOUNTER — Ambulatory Visit: Payer: 59 | Admitting: Radiation Oncology

## 2019-11-15 ENCOUNTER — Ambulatory Visit
Admission: RE | Admit: 2019-11-15 | Discharge: 2019-11-15 | Disposition: A | Discharge status: Home / Self Care | Payer: 59 | Source: Ambulatory Visit | Attending: Radiation Oncology | Admitting: Radiation Oncology

## 2019-11-15 ENCOUNTER — Other Ambulatory Visit: Payer: Self-pay

## 2019-11-15 DIAGNOSIS — C4922 Malignant neoplasm of connective and soft tissue of left lower limb, including hip: Secondary | ICD-10-CM

## 2019-11-15 DIAGNOSIS — C7652 Malignant neoplasm of left lower limb: Secondary | ICD-10-CM | POA: Diagnosis not present

## 2019-11-16 ENCOUNTER — Other Ambulatory Visit: Payer: Self-pay

## 2019-11-16 ENCOUNTER — Other Ambulatory Visit (HOSPITAL_COMMUNITY): Payer: Self-pay

## 2019-11-16 ENCOUNTER — Ambulatory Visit
Admission: RE | Admit: 2019-11-16 | Discharge: 2019-11-16 | Disposition: A | Discharge status: Home / Self Care | Payer: 59 | Source: Ambulatory Visit | Attending: Radiation Oncology | Admitting: Radiation Oncology

## 2019-11-16 ENCOUNTER — Encounter (HOSPITAL_COMMUNITY): Payer: Self-pay | Admitting: *Deleted

## 2019-11-16 DIAGNOSIS — C499 Malignant neoplasm of connective and soft tissue, unspecified: Secondary | ICD-10-CM

## 2019-11-16 DIAGNOSIS — C4922 Malignant neoplasm of connective and soft tissue of left lower limb, including hip: Secondary | ICD-10-CM

## 2019-11-16 DIAGNOSIS — C7652 Malignant neoplasm of left lower limb: Secondary | ICD-10-CM | POA: Diagnosis not present

## 2019-11-16 NOTE — Progress Notes (Signed)
Patient's wife called clinic today reporting that patient was not eating, not drinking, having diarrhea since his last treatment. She states that he is very pale.  She states that he went to radiation today and his blood pressure was low.  I have added him on for labs and visit with NP tomorrow.  I advised for them to prepare for possible fluids tomorrow.  I have notified radiation of need for visit time change tomorrow.  They will call patient with new time.

## 2019-11-17 ENCOUNTER — Inpatient Hospital Stay (HOSPITAL_COMMUNITY): Payer: 59

## 2019-11-17 ENCOUNTER — Inpatient Hospital Stay (HOSPITAL_BASED_OUTPATIENT_CLINIC_OR_DEPARTMENT_OTHER): Payer: 59 | Admitting: Oncology

## 2019-11-17 ENCOUNTER — Ambulatory Visit: Payer: 59 | Admitting: Radiation Oncology

## 2019-11-17 ENCOUNTER — Inpatient Hospital Stay (HOSPITAL_COMMUNITY): Payer: 59 | Attending: Oncology

## 2019-11-17 ENCOUNTER — Ambulatory Visit: Payer: 59

## 2019-11-17 VITALS — BP 100/76 | HR 81 | Temp 97.2°F | Resp 18

## 2019-11-17 VITALS — BP 114/65 | HR 102 | Temp 97.0°F | Resp 20 | Wt 231.9 lb

## 2019-11-17 DIAGNOSIS — R0789 Other chest pain: Secondary | ICD-10-CM | POA: Insufficient documentation

## 2019-11-17 DIAGNOSIS — R739 Hyperglycemia, unspecified: Secondary | ICD-10-CM | POA: Insufficient documentation

## 2019-11-17 DIAGNOSIS — Z9221 Personal history of antineoplastic chemotherapy: Secondary | ICD-10-CM | POA: Insufficient documentation

## 2019-11-17 DIAGNOSIS — C7652 Malignant neoplasm of left lower limb: Secondary | ICD-10-CM | POA: Diagnosis not present

## 2019-11-17 DIAGNOSIS — C4922 Malignant neoplasm of connective and soft tissue of left lower limb, including hip: Secondary | ICD-10-CM | POA: Insufficient documentation

## 2019-11-17 DIAGNOSIS — Z95828 Presence of other vascular implants and grafts: Secondary | ICD-10-CM

## 2019-11-17 DIAGNOSIS — R110 Nausea: Secondary | ICD-10-CM | POA: Insufficient documentation

## 2019-11-17 DIAGNOSIS — F418 Other specified anxiety disorders: Secondary | ICD-10-CM | POA: Insufficient documentation

## 2019-11-17 DIAGNOSIS — E669 Obesity, unspecified: Secondary | ICD-10-CM | POA: Insufficient documentation

## 2019-11-17 DIAGNOSIS — Z79899 Other long term (current) drug therapy: Secondary | ICD-10-CM | POA: Insufficient documentation

## 2019-11-17 DIAGNOSIS — E860 Dehydration: Secondary | ICD-10-CM

## 2019-11-17 DIAGNOSIS — C7800 Secondary malignant neoplasm of unspecified lung: Secondary | ICD-10-CM | POA: Insufficient documentation

## 2019-11-17 DIAGNOSIS — R5383 Other fatigue: Secondary | ICD-10-CM | POA: Insufficient documentation

## 2019-11-17 DIAGNOSIS — K1230 Oral mucositis (ulcerative), unspecified: Secondary | ICD-10-CM | POA: Insufficient documentation

## 2019-11-17 DIAGNOSIS — D630 Anemia in neoplastic disease: Secondary | ICD-10-CM

## 2019-11-17 DIAGNOSIS — R197 Diarrhea, unspecified: Secondary | ICD-10-CM | POA: Insufficient documentation

## 2019-11-17 DIAGNOSIS — C499 Malignant neoplasm of connective and soft tissue, unspecified: Secondary | ICD-10-CM | POA: Insufficient documentation

## 2019-11-17 DIAGNOSIS — R630 Anorexia: Secondary | ICD-10-CM | POA: Insufficient documentation

## 2019-11-17 DIAGNOSIS — R634 Abnormal weight loss: Secondary | ICD-10-CM | POA: Insufficient documentation

## 2019-11-17 DIAGNOSIS — D649 Anemia, unspecified: Secondary | ICD-10-CM | POA: Insufficient documentation

## 2019-11-17 DIAGNOSIS — T451X5A Adverse effect of antineoplastic and immunosuppressive drugs, initial encounter: Secondary | ICD-10-CM

## 2019-11-17 DIAGNOSIS — R059 Cough, unspecified: Secondary | ICD-10-CM | POA: Insufficient documentation

## 2019-11-17 DIAGNOSIS — D6481 Anemia due to antineoplastic chemotherapy: Secondary | ICD-10-CM

## 2019-11-17 DIAGNOSIS — Z87891 Personal history of nicotine dependence: Secondary | ICD-10-CM | POA: Insufficient documentation

## 2019-11-17 LAB — COMPREHENSIVE METABOLIC PANEL
ALT: 27 U/L (ref 0–44)
AST: 36 U/L (ref 15–41)
Albumin: 3 g/dL — ABNORMAL LOW (ref 3.5–5.0)
Alkaline Phosphatase: 141 U/L — ABNORMAL HIGH (ref 38–126)
Anion gap: 10 (ref 5–15)
BUN: 11 mg/dL (ref 6–20)
CO2: 24 mmol/L (ref 22–32)
Calcium: 8.2 mg/dL — ABNORMAL LOW (ref 8.9–10.3)
Chloride: 103 mmol/L (ref 98–111)
Creatinine, Ser: 0.8 mg/dL (ref 0.61–1.24)
GFR, Estimated: >60 mL/min (ref >60)
Glucose, Bld: 128 mg/dL — ABNORMAL HIGH (ref 70–99)
Potassium: 3.5 mmol/L (ref 3.5–5.1)
Sodium: 137 mmol/L (ref 135–145)
Total Bilirubin: 1 mg/dL (ref 0.3–1.2)
Total Protein: 5.3 g/dL — ABNORMAL LOW (ref 6.5–8.1)

## 2019-11-17 LAB — ABO/RH: ABO/RH(D): O POS

## 2019-11-17 LAB — CBC WITH DIFFERENTIAL/PLATELET
Band Neutrophils: 2 %
Basophils Absolute: 0 10*3/uL (ref 0.0–0.1)
Basophils Relative: 0 %
Eosinophils Absolute: 0 10*3/uL (ref 0.0–0.5)
Eosinophils Relative: 0 %
HCT: 19.4 % — ABNORMAL LOW (ref 39.0–52.0)
Hemoglobin: 5.9 g/dL — CL (ref 13.0–17.0)
Lymphocytes Relative: 3 %
Lymphs Abs: 0.4 10*3/uL — ABNORMAL LOW (ref 0.7–4.0)
MCH: 33.1 pg (ref 26.0–34.0)
MCHC: 30.4 g/dL (ref 30.0–36.0)
MCV: 109 fL — ABNORMAL HIGH (ref 80.0–100.0)
Metamyelocytes Relative: 8 %
Monocytes Absolute: 0.6 10*3/uL (ref 0.1–1.0)
Monocytes Relative: 5 %
Myelocytes: 4 %
Neutro Abs: 9.2 10*3/uL — ABNORMAL HIGH (ref 1.7–7.7)
Neutrophils Relative %: 77 %
Platelets: 62 10*3/uL — ABNORMAL LOW (ref 150–400)
Promyelocytes Relative: 1 %
RBC: 1.78 MIL/uL — ABNORMAL LOW (ref 4.22–5.81)
RDW: 20 % — ABNORMAL HIGH (ref 11.5–15.5)
WBC: 11.7 10*3/uL — ABNORMAL HIGH (ref 4.0–10.5)
nRBC: 1.5 % — ABNORMAL HIGH (ref 0.0–0.2)

## 2019-11-17 LAB — IRON AND TIBC
Iron: 33 ug/dL — ABNORMAL LOW (ref 45–182)
Saturation Ratios: 15 % — ABNORMAL LOW (ref 17.9–39.5)
TIBC: 217 ug/dL — ABNORMAL LOW (ref 250–450)
UIBC: 184 ug/dL

## 2019-11-17 LAB — FOLATE: Folate: 13.6 ng/mL (ref >5.9)

## 2019-11-17 LAB — VITAMIN B12: Vitamin B-12: 1194 pg/mL — ABNORMAL HIGH (ref 180–914)

## 2019-11-17 LAB — MAGNESIUM: Magnesium: 1.8 mg/dL (ref 1.7–2.4)

## 2019-11-17 LAB — FERRITIN: Ferritin: 805 ng/mL — ABNORMAL HIGH (ref 24–336)

## 2019-11-17 LAB — PREPARE RBC (CROSSMATCH)

## 2019-11-17 MED ORDER — ACETAMINOPHEN 325 MG PO TABS
650.0000 mg | ORAL_TABLET | Freq: Once | ORAL | Status: AC
  Administered 2019-11-17: 650 mg via ORAL
  Filled 2019-11-17: qty 2

## 2019-11-17 MED ORDER — ONDANSETRON HCL 4 MG/2ML IJ SOLN
4.0000 mg | Freq: Once | INTRAMUSCULAR | Status: AC
  Administered 2019-11-17: 4 mg via INTRAVENOUS

## 2019-11-17 MED ORDER — SODIUM CHLORIDE 0.9% FLUSH
10.0000 mL | INTRAVENOUS | Status: AC | PRN
  Administered 2019-11-17: 10 mL

## 2019-11-17 MED ORDER — ONDANSETRON HCL 4 MG/2ML IJ SOLN
INTRAMUSCULAR | Status: AC
  Filled 2019-11-17: qty 2

## 2019-11-17 MED ORDER — SODIUM CHLORIDE 0.9 % IV SOLN
Freq: Once | INTRAVENOUS | Status: AC
  Filled 2019-11-17: qty 1000

## 2019-11-17 MED ORDER — HEPARIN SOD (PORK) LOCK FLUSH 100 UNIT/ML IV SOLN
500.0000 [IU] | Freq: Every day | INTRAVENOUS | Status: AC | PRN
  Administered 2019-11-17: 500 [IU]

## 2019-11-17 MED ORDER — DIPHENHYDRAMINE HCL 25 MG PO CAPS
25.0000 mg | ORAL_CAPSULE | Freq: Once | ORAL | Status: AC
  Administered 2019-11-17: 25 mg via ORAL
  Filled 2019-11-17: qty 1

## 2019-11-17 MED ORDER — SODIUM CHLORIDE 0.9% IV SOLUTION
250.0000 mL | Freq: Once | INTRAVENOUS | Status: AC
  Administered 2019-11-17: 250 mL via INTRAVENOUS

## 2019-11-17 MED ORDER — HEPARIN SOD (PORK) LOCK FLUSH 100 UNIT/ML IV SOLN: 500.0000 [IU] | Freq: Once | INTRAVENOUS | Status: DC | PRN

## 2019-11-17 NOTE — Progress Notes (Signed)
CRITICAL VALUE ALERT  Critical Value:  hgb 5.9  Date & Time Notied:  11/17/2019 at 1154  Provider Notified: Lorretta Harp, NP  Orders Received/Actions taken: transfuse 2 units PRBC   Tolerated IVF and transfusions w/o adverse reaction.  Alert, in no distress.  Discharged via wheelchair in stable condition in c/o spouse.

## 2019-11-17 NOTE — Progress Notes (Signed)
[place], [place] [address]   CLINIC:  Medical Oncology/Hematology  PCP:  Celene Squibb, MD [address] [name] [address] [phone]   REASON FOR VISIT:  Follow-up for metastatic dedifferentiated liposarcoma  PRIOR THERAPY: Gemcitabine and docetaxel followed by wide local excision on 11/10/2018  NGS Results: Pending  CURRENT THERAPY: Gemcitabine and docetaxel 2 weeks on, 1 week off  BRIEF ONCOLOGIC HISTORY:  Oncology History  Liposarcoma of left lower extremity ([place])  04/25/2019 Initial Diagnosis   Liposarcoma of left lower extremity ([place])   05/22/2019 - 07/04/2019 Chemotherapy   The patient had DOXOrubicin (ADRIAMYCIN) chemo injection 174 mg, 75 mg/m2 = 174 mg, Intravenous,  Once, 3 of 6 cycles Administration: 174 mg (05/22/2019), 174 mg (06/13/2019), 174 mg (07/04/2019) palonosetron (ALOXI) injection 0.25 mg, 0.25 mg, Intravenous,  Once, 3 of 6 cycles Administration: 0.25 mg (05/22/2019), 0.25 mg (06/13/2019), 0.25 mg (07/04/2019) fosaprepitant (EMEND) 150 mg in sodium chloride 0.9 % 145 mL IVPB, 150 mg, Intravenous,  Once, 3 of 6 cycles Administration: 150 mg (05/22/2019), 150 mg (06/13/2019), 150 mg (07/04/2019)  for chemotherapy treatment.    08/11/2019 -  Chemotherapy   The patient had palonosetron (ALOXI) injection 0.25 mg, 0.25 mg, Intravenous,  Once, 5 of 6 cycles Administration: 0.25 mg (08/18/2019), 0.25 mg (09/07/2019), 0.25 mg (09/28/2019), 0.25 mg (10/19/2019), 0.25 mg (11/09/2019) ondansetron (ZOFRAN) injection 8 mg, 8 mg (100 % of original dose 8 mg), Intravenous,  Once, 3 of 3 cycles Dose modification: 8 mg (original dose 8 mg, Cycle 1) Administration: 8 mg (08/11/2019), 8 mg (08/31/2019), 8 mg (09/21/2019) pegfilgrastim (NEULASTA) injection 6 mg, 6 mg, Subcutaneous, Once, 1 of 1 cycle Administration: 6 mg (08/21/2019) pegfilgrastim (NEULASTA ONPRO KIT) injection 6 mg, 6 mg, Subcutaneous, Once, 5 of 6 cycles Administration: 6 mg  (09/07/2019), 6 mg (09/28/2019), 6 mg (10/19/2019), 6 mg (11/09/2019) ondansetron (ZOFRAN) 8 mg in sodium chloride 0.9 % 50 mL IVPB, , Intravenous,  Once, 2 of 3 cycles Administration: 8 mg (10/12/2019), 8 mg (11/02/2019) DOCEtaxel (TAXOTERE) 130 mg in sodium chloride 0.9 % 250 mL chemo infusion, 56.25 mg/m2 = 170 mg (75 % of original dose 100 mg/m2), Intravenous,  Once, 5 of 6 cycles Dose modification: 75 mg/m2 (75 % of original dose 100 mg/m2, Cycle 1, Reason: Provider Judgment), 75 mg/m2 (75 % of original dose 100 mg/m2, Cycle 2, Reason: Provider Judgment), 75 mg/m2 (original dose 100 mg/m2, Cycle 3, Reason: Provider Judgment), 99.2426 mg/m2 (66.7 % of original dose 100 mg/m2, Cycle 3, Reason: Other (see comments), Comment: neutropenia and elevated LFT), 66.6667 mg/m2 (66.7 % of original dose 100 mg/m2, Cycle 4, Reason: Change in LFTs) Administration: 130 mg (08/18/2019), 170 mg (09/07/2019), 150 mg (09/28/2019), 150 mg (10/19/2019), 150 mg (11/09/2019) gemcitabine (GEMZAR) 2,052 mg in sodium chloride 0.9 % 250 mL chemo infusion, 900 mg/m2 = 2,052 mg, Intravenous,  Once, 5 of 6 cycles Dose modification: 720 mg/m2 (80 % of original dose 900 mg/m2, Cycle 2, Reason: Provider Judgment) Administration: 2,052 mg (08/11/2019), 1,520 mg (08/18/2019), 1,634 mg (08/31/2019), 1,634 mg (09/07/2019), 1,634 mg (09/21/2019), 1,634 mg (09/28/2019), 1,634 mg (10/12/2019), 1,634 mg (10/19/2019), 1,634 mg (11/02/2019), 1,634 mg (11/09/2019)  for chemotherapy treatment.    Liposarcoma ([place])  05/15/2019 Initial Diagnosis   Liposarcoma ([place])   05/15/2019 Cancer Staging   Staging form: Primary Cutaneous B-Cell/T-Cell Lymphoma (Non-MF/SS Lymphoma), AJCC 8th Edition - Clinical stage from 05/15/2019: [name], pM1 - Signed by [name], MD  on 05/15/2019     CANCER STAGING: Cancer Staging Liposarcoma [facility]) Staging form: Primary Cutaneous B-Cell/T-Cell Lymphoma (Non-MF/SS Lymphoma), AJCC 8th Edition - Clinical stage from 05/15/2019:  [name], pM1 - Signed by [name], MD on 05/15/2019  Liposarcoma of left lower extremity [facility]) Staging form: Primary Cutaneous B-Cell/T-Cell Lymphoma (Non-MF/SS Lymphoma), AJCC 8th Edition - Clinical: No stage assigned - Unsigned   INTERVAL HISTORY:  Leonard [name], a 59 y.o. male, who presents for an acute visit secondary to extreme fatigue and weakness.  He was last seen on 11/09/2019 for cycle 5-day 8 of gemcitabine and docetaxel.  He has tolerated treatment fairly well.  He has recently started radiation.  Patient's wife called clinic on 11/16/2019 stating that he was not eating or drinking and having diarrhea since his last treatment.  She describes him as very pale with a low blood pressure during his recent radiation visit.  Today, he continues to feel weak and extremely fatigued.  His diarrhea has improved but he just feels really poor.  He denies any significant pain.  Reports nausea.  He is taking antiemetics as prescribed.  Able to drink fluids but does not have a "taste for anything".  He has radiation scheduled this afternoon.  REVIEW OF SYSTEMS:  Review of Systems  Constitutional: Positive for fatigue. Negative for appetite change, chills and fever.  HENT:  Negative.  Negative for hearing loss, lump/mass, mouth sores and nosebleeds.   Eyes: Negative.  Negative for eye problems.  Respiratory: Positive for shortness of breath. Negative for cough and hemoptysis.   Cardiovascular: Negative.  Negative for chest pain and leg swelling.  Gastrointestinal: Positive for diarrhea and nausea. Negative for abdominal pain, blood in stool, constipation and vomiting.  Endocrine: Negative.  Negative for hot flashes.  Genitourinary: Negative.  Negative for bladder incontinence, difficulty urinating, dysuria, frequency and hematuria.   Musculoskeletal: Positive for gait problem. Negative for back pain, flank pain and myalgias.  Skin: Negative.  Negative for itching and rash.    Neurological: Positive for dizziness and gait problem. Negative for headaches, light-headedness and numbness.  Hematological: Negative.  Negative for adenopathy.  Psychiatric/Behavioral: Negative for confusion. The patient is not nervous/anxious.     PAST MEDICAL/SURGICAL HISTORY:  Past Medical History:  Diagnosis Date  . Cancer ([place])   . Port-A-Cath in place 05/19/2019   right   Past Surgical History:  Procedure Laterality Date  . CHOLECYSTECTOMY    . ESOPHAGEAL DILATION N/A 06/27/2017   Procedure: ESOPHAGEAL DILATION;  Surgeon: Danie Binder, MD;  Location: [facility];  Service: Endoscopy;  Laterality: N/A;  . ESOPHAGOGASTRODUODENOSCOPY N/A 06/27/2017   Procedure: ESOPHAGOGASTRODUODENOSCOPY ([facility]);  Surgeon: Danie Binder, MD;  Location: [facility];  Service: Endoscopy;  Laterality: N/A;  . IR IMAGING GUIDED PORT INSERTION  05/04/2019  . knee sx      SOCIAL HISTORY:  Social History   Socioeconomic History  . Marital status: Married    Spouse name: Not on file  . Number of children: 1  . Years of education: Not on file  . Highest education level: Not on file  Occupational History  . Occupation: Unemployed  Tobacco Use  . Smoking status: Former Research scientist (life sciences)  . Smokeless tobacco: Never Used  Vaping Use  . Vaping Use: Never used  Substance and Sexual Activity  . Alcohol use: Yes    Comment: 1-2 drinks per month  . Drug use: Never  . Sexual activity: Not Currently  Other Topics Concern  .  Not on file  Social History Narrative   DOES CONSTRUCTION. MARRIED. RARE ETOH. NO TOBACCO.   Social Determinants of Health   Financial Resource Strain: Medium Risk  . Difficulty of Paying Living Expenses: Somewhat hard  Food Insecurity: No Food Insecurity  . Worried About Charity fundraiser in the Last Year: Never true  . Ran Out of Food in the Last Year: Never true  Transportation Needs: No Transportation Needs  . Lack of Transportation (Medical): No  . Lack of  Transportation (Non-Medical): No  Physical Activity: Inactive  . Days of Exercise per Week: 0 days  . Minutes of Exercise per Session: 0 min  Stress: Stress Concern Present  . Feeling of Stress : To some extent  Social Connections: Moderately Integrated  . Frequency of Communication with Friends and Family: More than three times a week  . Frequency of Social Gatherings with Friends and Family: Once a week  . Attends Religious Services: More than 4 times per year  . Active Member of Clubs or Organizations: No  . Attends Archivist Meetings: Never  . Marital Status: Married  Human resources officer Violence: Not At Risk  . Fear of Current or Ex-Partner: No  . Emotionally Abused: No  . Physically Abused: No  . Sexually Abused: No    FAMILY HISTORY:  Family History  Problem Relation Age of Onset  . Heart disease Father   . Cancer Maternal Aunt   . Cancer Maternal Uncle   . Cancer Paternal Uncle   . Vision loss Maternal Grandfather   . Colon cancer Neg Hx   . Colon polyps Neg Hx     CURRENT MEDICATIONS:  Current Outpatient Medications  Medication Sig Dispense Refill  . ALPRAZolam (XANAX) 0.25 MG tablet Take 1 tablet (0.25 mg total) by mouth 2 (two) times daily as needed for anxiety. 60 tablet 3  . aluminum-magnesium hydroxide-simethicone (MAALOX) 768-115-72 MG/5ML SUSP Swish and swallow 5 ml four times daily as needed for mouth sores. 600 mL 2  . DIPHENHIST 12.5 MG/5ML liquid SWISH AND SWALLOW ONETTEASPOONFUL 4 TIMES DAILYIAS NEEDED FOR MOUTH SORES.    . DOCEtaxel (TAXOTERE IV) Inject into the vein.    Marland Kitchen escitalopram (LEXAPRO) 10 MG tablet Take 1 tablet (10 mg total) by mouth daily. 30 tablet 2  . Gemcitabine HCl (GEMZAR IV) Inject into the vein.    Marland Kitchen HYDROcodone-homatropine (HYCODAN) 5-1.5 MG/5ML syrup SMARTSIG:1 Teaspoon By Mouth Every 6 Hours PRN 480 mL 0  . lidocaine (XYLOCAINE) 2 % solution SMARTSIG:1 Teaspoon By Mouth 4 Times Daily PRN    . Oxycodone HCl 10 MG TABS Take  0.5 tablets (5 mg total) by mouth every 8 (eight) hours as needed. 30 tablet 0   No current facility-administered medications for this visit.   Facility-Administered Medications Ordered in Other Visits  Medication Dose Route Frequency Provider Last Rate Last Admin  . sodium chloride flush (NS) 0.9 % injection 10 mL  10 mL Intravenous PRN [name], MD   10 mL at 08/21/19 1508    ALLERGIES:  No Known Allergies  PHYSICAL EXAM:  Performance status (ECOG): 1 - Symptomatic but completely ambulatory  Vitals:   11/17/19 1045  BP: 114/65  Pulse: (!) 102  Resp: 20  Temp: (!) 97 F (36.1 C)  SpO2: 93%   Wt Readings from Last 3 Encounters:  11/23/19 226 lb 12.8 oz (102.9 kg)  11/17/19 231 lb 14.4 oz (105.2 kg)  11/09/19 233 lb 12.8 oz (106.1 kg)  Physical Exam Vitals reviewed.  Constitutional:      Appearance: Normal appearance. He is obese.  Cardiovascular:     Rate and Rhythm: Normal rate and regular rhythm.     Pulses: Normal pulses.     Heart sounds: Normal heart sounds.  Pulmonary:     Effort: Pulmonary effort is normal.     Breath sounds: Normal breath sounds.  Chest:     Comments: Port-a-Cath in R chest Musculoskeletal:     Right lower leg: No edema.     Left lower leg: No edema.  Skin:    Coloration: Skin is pale.  Neurological:     General: No focal deficit present.     Mental Status: He is alert and oriented to person, place, and time.  Psychiatric:        Mood and Affect: Mood normal.        Behavior: Behavior normal.     LABORATORY DATA:  I have reviewed the labs as listed.  CBC Latest Ref Rng & Units 11/23/2019 11/17/2019 11/09/2019  WBC 4.0 - 10.5 K/uL 5.1 11.7(H) 1.9(L)  Hemoglobin 13.0 - 17.0 g/dL 13.4 5.9(LL) 10.8(L)  Hematocrit 39 - 52 % 42.4 19.4(L) 33.1(L)  Platelets 150 - 400 K/uL 295 62(L) 215   CMP Latest Ref Rng & Units 11/23/2019 11/17/2019 11/09/2019  Glucose 70 - 99 mg/dL 148(H) 128(H) 122(H)  BUN 6 - 20 mg/dL _0 Creatinine 0.61 - 1.24 mg/dL 0.85 0.80 0.78  Sodium 135 - 145 mmol/L 134(L) 137 136  Potassium 3.5 - 5.1 mmol/L 3.9 3.5 3.7  Chloride 98 - 111 mmol/L 101 103 104  CO2 22 - 32 mmol/L _1 Calcium 8.9 - 10.3 mg/dL 8.3(L) 8.2(L) 8.3(L)  Total Protein 6.5 - 8.1 g/dL 5.9(L) 5.3(L) 5.5(L)  Total Bilirubin 0.3 - 1.2 mg/dL 0.8 1.0 0.5  Alkaline Phos 38 - 126 U/L 117 141(H) 85  AST 15 - 41 U/L 32 36 49(H)  ALT 0 - 44 U/L 21 27 48(H)    DIAGNOSTIC IMAGING:  I have independently reviewed the scans and discussed with the patient. CT CHEST ABDOMEN PELVIS W CONTRAST  Result Date: 11/01/2019 CLINICAL DATA:  History of liposarcoma, follow-up de differentiated liposarcoma of the LEFT lower extremity. EXAM: CT CHEST, ABDOMEN, AND PELVIS WITH CONTRAST TECHNIQUE: Multidetector CT imaging of the chest, abdomen and pelvis was performed following the standard protocol during bolus administration of intravenous contrast. CONTRAST:  182m OMNIPAQUE IOHEXOL 300 MG/ML  SOLN COMPARISON:  07/19/2019 FINDINGS: CT CHEST FINDINGS Cardiovascular: RIGHT-sided Port-A-Cath in situ terminates at the caval to atrial junction. Heart size is stable without pericardial effusion. Three-vessel coronary artery disease. Thoracic aorta is normal caliber with scattered atheromatous plaque. Central pulmonary vasculature unremarkable on venous phase assessment. Mediastinum/Nodes: No adenopathy in the chest. Lungs/Pleura: Numerous pulmonary lesions. A RIGHT lung lesion on image 62 of series 3 measuring 2 x 1.6 cm. Previously 1.5 x 1.4 cm. LEFT upper lobe lesion measuring 2.1 x 1.8 cm previously approximately 3.3 x 2.7 cm (image 31, series 3) Lingular lesion 2.2 x 2.3 cm (image 77, series 3) is previously 3.1 x 3.1 cm. Near complete resolution of small RIGHT upper lobe lesion in the medial RIGHT upper lobe with only bandlike area remaining on image 48 of series 3. This area previously measured 1.4 x 1.3 cm, currently 1 cm in length an  approximately 3 mm short axis. RIGHT upper lobe lesion just above the fissure (image 93, series  3) 1.3 cm previously 2.6 cm. (Image 91, series 3) 6.8 x 4.3 cm lesion with low-density, previously approximately 7.3 by 2.8 cm. Tiny new lesion approximately 5 mm on image 73 of series 3 along the minor fissure. Musculoskeletal: New lytic focus with subacute fracture in the LEFT posterior fourth rib. Perhaps a subtle area on the most recent prior but no abnormality seen on the initial evaluation. CT ABDOMEN PELVIS FINDINGS Hepatobiliary: Hepatic steatosis. No focal hepatic lesion. Portal vein is patent. Post cholecystectomy without biliary duct dilation. Pancreas: Pancreas is normal without ductal dilation or sign of inflammation. Spleen: Spleen normal in size and contour without focal lesion. Adrenals/Urinary Tract: Adrenal glands are normal. Symmetric renal enhancement. No hydronephrosis. Stable, approximately 1.5 cm low-density lesion in the interpolar LEFT kidney. Stomach/Bowel: No acute gastrointestinal process. Vascular/Lymphatic: Calcified atheromatous plaque of the abdominal aorta without aneurysmal dilation. There is no gastrohepatic or hepatoduodenal ligament lymphadenopathy. No retroperitoneal or mesenteric lymphadenopathy. No pelvic sidewall lymphadenopathy. Reproductive: Prostate with mild heterogeneity, nonspecific on CT and unchanged. Other: No ascites. Musculoskeletal: Rib lesion as described above. 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 Subtle and unchanged. IMPRESSION: 1. On balance, interval improvement with respect to pulmonary metastatic disease but with new 5 mm nodule and with increase in size of 1 of the RIGHT upper lobe lesions and low attenuation along the major fissure in the RIGHT chest. Low attenuation along the major fissure in the RIGHT chest with shorter long axis dimension but with increase in overall volume as measured by this observer on the prior exam. 2. New lytic  focus with subacute fracture in the LEFT posterior fourth rib. Perhaps a subtle area on the most recent prior but no abnormality seen on the initial evaluation. Attention on follow-up. 3. Hepatic steatosis. 4. Stable, approximately 1.5 cm low-density lesion in the interpolar LEFT kidney. This shows low-density but is mildly heterogeneous. Lesion is unchanged compared to previous studies, attention on follow-up. 5. 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 Subtle and unchanged. 6. Aortic atherosclerosis. Aortic Atherosclerosis (ICD10-I70.0). Electronically Signed   By: Zetta Bills M.D.   On: 11/01/2019 19:15     ASSESSMENT:  1. 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 -Chemoradiation therapy with gemcitabine and docetaxel followed by wide local excision. -Pathology on 11/10/2018 showed liposarcoma with 80% necrosis, grade 2, ypT4, NX, positive deep margin, amplification of MDM 2 detected. Amplification is associated with well-differentiated liposarcoma/atypical lipomatous tumors and dedifferentiated liposarcoma. -CT CAP in the ER on 04/24/2019 showed widespread pulmonary metastatic lesions throughout both lungs, largest left upper lobe mass measuring 4.7 x 4.2 cm. Mass in the inferior lingula measures 4.12 x 3.2 cm. -Bone scan on 05/02/2019 did not show any evidence of metastatic disease. -2D echo on 05/02/2019 shows EF 60 to 65%. -Adriamycin every 3 weeks started on 05/22/2019. -CT CAP on 07/19/2019 showed dominant lung lesions have decreased in size. 2 nodules in the right major fissure have progressed while some other pulmonary nodules are unchanged. No evidence of metastatic disease in the abdomen or pelvis. -Gemcitabine and docetaxel every 21 days started on 08/11/2019. -CT CAP on 11/01/2019 showed interval improvement in the pulmonary metastatic disease but new  5 mm nodule in the right upper lobe.  Right lower lobe lesion has increased in volume.  Lytic focus in the left posterior fourth rib is slightly prominent.  Hepatic steatosis.  2. Left kidney lesion: -CT on  04/24/2019 showed mass arising in the periphery of the upper lobe of the left kidney measuring 1.3 x 1.4 cm. -[facility] in August 2020 was showing left kidney lesion. -Current scan on 07/19/2019 showed 15 mm hypoattenuating lesion in the upper pole left kidney stable since prior.   PLAN:  1. Metastatic dedifferentiated liposarcoma: -Status post 5 cycles of gemcitabine docetaxel. -He started SBRT yesterday. -To date he has tolerated treatment well. -We will collect lab work-CBC and CMP  -Labs from today show severe anemia with a hemoglobin of 5.9, white count 11.7, platelets 62,000. -We will plan to give 2 units of packed red blood cells today. -Return to clinic next week for repeat labs and follow-up with Dr. Delton Coombes -He will cancel radiation for today and for Monday so he can recover.  2.  Severe anemia/thrombocytopenia: -No active bleeding. -Likely secondary to treatment. -2 units packed red blood cells today with repeat lab work next week.  3.  Leukocytosis: -No fever -No signs of infection -Patient to call clinic if he develops symptoms  4. Anxiety: -Continue Xanax at bedtime as needed.  5. Right-sided chest wall pain: -He is not requiring any pain medicine on a daily basis.  6. Elevated blood sugar: -His recent hemoglobin A1c was 5.3 despite his sugar being 147.  7. Weight loss: -He is continuing to gain weight lately.  He is drinking 2 to 3 cans of boost per day.  8. Depression: -Lexapro is helping his mood.  9. Mucositis: -Continue Maalox with viscous lidocaine as needed.  10.  Dehydration: -Secondary to anorexia -Recommend supplements as he can tolerate. -We will also give him a liter of normal saline and 10 mg  dexamethasone  11.  Nausea: -Secondary to treatment -Continue antiemetics -We will give him IV Zofran while in clinic  Disposition: Return to clinic as scheduled next week.   Orders placed this encounter:  No orders of the defined types were placed in this encounter.  Greater than 50% was spent in counseling and coordination of care with this patient including but not limited to discussion of the relevant topics above (See A&P) including, but not limited to diagnosis and management of acute and chronic medical conditions.   Faythe Casa, NP 11/24/2019 7:58 AM

## 2019-11-17 NOTE — Progress Notes (Signed)
Patients port flushed without difficulty.  Good blood return noted with no bruising or swelling noted at site.  Transparent dressing applied and patient left accessed for fluid therapy.

## 2019-11-18 LAB — BPAM RBC
Blood Product Expiration Date: 202112102359
Blood Product Expiration Date: 202112112359
ISSUE DATE / TIME: 202111051352
ISSUE DATE / TIME: 202111051500
Unit Type and Rh: 5100
Unit Type and Rh: 5100

## 2019-11-18 LAB — TYPE AND SCREEN
ABO/RH(D): O POS
Antibody Screen: NEGATIVE
Unit division: 0
Unit division: 0

## 2019-11-20 ENCOUNTER — Ambulatory Visit: Payer: 59

## 2019-11-21 ENCOUNTER — Other Ambulatory Visit: Payer: Self-pay

## 2019-11-21 ENCOUNTER — Ambulatory Visit
Admission: RE | Admit: 2019-11-21 | Discharge: 2019-11-21 | Disposition: A | Discharge status: Home / Self Care | Payer: 59 | Source: Ambulatory Visit | Attending: Radiation Oncology | Admitting: Radiation Oncology

## 2019-11-21 DIAGNOSIS — C7652 Malignant neoplasm of left lower limb: Secondary | ICD-10-CM | POA: Diagnosis not present

## 2019-11-22 ENCOUNTER — Ambulatory Visit
Admission: RE | Admit: 2019-11-22 | Discharge: 2019-11-22 | Disposition: A | Discharge status: Home / Self Care | Payer: 59 | Source: Ambulatory Visit | Attending: Radiation Oncology | Admitting: Radiation Oncology

## 2019-11-22 ENCOUNTER — Other Ambulatory Visit (HOSPITAL_COMMUNITY): Payer: 59

## 2019-11-22 DIAGNOSIS — C7652 Malignant neoplasm of left lower limb: Secondary | ICD-10-CM | POA: Diagnosis not present

## 2019-11-23 ENCOUNTER — Inpatient Hospital Stay (HOSPITAL_COMMUNITY): Payer: 59 | Admitting: Hematology

## 2019-11-23 ENCOUNTER — Ambulatory Visit
Admission: RE | Admit: 2019-11-23 | Discharge: 2019-11-23 | Disposition: A | Discharge status: Home / Self Care | Payer: 59 | Source: Ambulatory Visit | Attending: Radiation Oncology | Admitting: Radiation Oncology

## 2019-11-23 ENCOUNTER — Inpatient Hospital Stay (HOSPITAL_COMMUNITY): Payer: 59

## 2019-11-23 ENCOUNTER — Other Ambulatory Visit: Payer: Self-pay

## 2019-11-23 VITALS — BP 110/71 | HR 113 | Temp 96.8°F | Resp 19 | Wt 226.8 lb

## 2019-11-23 DIAGNOSIS — C7652 Malignant neoplasm of left lower limb: Secondary | ICD-10-CM | POA: Diagnosis not present

## 2019-11-23 DIAGNOSIS — Z95828 Presence of other vascular implants and grafts: Secondary | ICD-10-CM

## 2019-11-23 DIAGNOSIS — C4922 Malignant neoplasm of connective and soft tissue of left lower limb, including hip: Secondary | ICD-10-CM

## 2019-11-23 LAB — COMPREHENSIVE METABOLIC PANEL
ALT: 21 U/L (ref 0–44)
AST: 32 U/L (ref 15–41)
Albumin: 3.1 g/dL — ABNORMAL LOW (ref 3.5–5.0)
Alkaline Phosphatase: 117 U/L (ref 38–126)
Anion gap: 10 (ref 5–15)
BUN: 13 mg/dL (ref 6–20)
CO2: 23 mmol/L (ref 22–32)
Calcium: 8.3 mg/dL — ABNORMAL LOW (ref 8.9–10.3)
Chloride: 101 mmol/L (ref 98–111)
Creatinine, Ser: 0.85 mg/dL (ref 0.61–1.24)
GFR, Estimated: >60 mL/min (ref >60)
Glucose, Bld: 148 mg/dL — ABNORMAL HIGH (ref 70–99)
Potassium: 3.9 mmol/L (ref 3.5–5.1)
Sodium: 134 mmol/L — ABNORMAL LOW (ref 135–145)
Total Bilirubin: 0.8 mg/dL (ref 0.3–1.2)
Total Protein: 5.9 g/dL — ABNORMAL LOW (ref 6.5–8.1)

## 2019-11-23 LAB — CBC WITH DIFFERENTIAL/PLATELET
Abs Immature Granulocytes: 0.14 10*3/uL — ABNORMAL HIGH (ref 0.00–0.07)
Basophils Absolute: 0.1 10*3/uL (ref 0.0–0.1)
Basophils Relative: 1 %
Eosinophils Absolute: 0 10*3/uL (ref 0.0–0.5)
Eosinophils Relative: 1 %
HCT: 42.4 % (ref 39.0–52.0)
Hemoglobin: 13.4 g/dL (ref 13.0–17.0)
Immature Granulocytes: 3 %
Lymphocytes Relative: 6 %
Lymphs Abs: 0.3 10*3/uL — ABNORMAL LOW (ref 0.7–4.0)
MCH: 31.5 pg (ref 26.0–34.0)
MCHC: 31.6 g/dL (ref 30.0–36.0)
MCV: 99.8 fL (ref 80.0–100.0)
Monocytes Absolute: 0.7 10*3/uL (ref 0.1–1.0)
Monocytes Relative: 14 %
Neutro Abs: 3.9 10*3/uL (ref 1.7–7.7)
Neutrophils Relative %: 75 %
Platelets: 295 10*3/uL (ref 150–400)
RBC: 4.25 MIL/uL (ref 4.22–5.81)
RDW: 19.4 % — ABNORMAL HIGH (ref 11.5–15.5)
WBC: 5.1 10*3/uL (ref 4.0–10.5)
nRBC: 0 % (ref 0.0–0.2)

## 2019-11-23 NOTE — Progress Notes (Signed)
 [facility] [address] [place], [place] [address]   CLINIC:  Medical Oncology/Hematology  PCP:  Hall, John Z, MD [address] / [place] [address] [phone]   REASON FOR VISIT:  Follow-up for metastatic dedifferentiated liposarcoma  PRIOR THERAPY: Gemcitabine and docetaxel followed by wide local excision on 11/10/2018  NGS Results: Pending  CURRENT THERAPY: Gemcitabine  BRIEF ONCOLOGIC HISTORY:  Oncology History  Liposarcoma of left lower extremity (HCC)  04/25/2019 Initial Diagnosis   Liposarcoma of left lower extremity (HCC)   05/22/2019 - 07/04/2019 Chemotherapy   The patient had DOXOrubicin (ADRIAMYCIN) chemo injection 174 mg, 75 mg/m2 = 174 mg, Intravenous,  Once, 3 of 6 cycles Administration: 174 mg (05/22/2019), 174 mg (06/13/2019), 174 mg (07/04/2019) palonosetron (ALOXI) injection 0.25 mg, 0.25 mg, Intravenous,  Once, 3 of 6 cycles Administration: 0.25 mg (05/22/2019), 0.25 mg (06/13/2019), 0.25 mg (07/04/2019) fosaprepitant (EMEND) 150 mg in sodium chloride 0.9 % 145 mL IVPB, 150 mg, Intravenous,  Once, 3 of 6 cycles Administration: 150 mg (05/22/2019), 150 mg (06/13/2019), 150 mg (07/04/2019)  for chemotherapy treatment.    08/11/2019 -  Chemotherapy   The patient had palonosetron (ALOXI) injection 0.25 mg, 0.25 mg, Intravenous,  Once, 5 of 6 cycles Administration: 0.25 mg (08/18/2019), 0.25 mg (09/07/2019), 0.25 mg (09/28/2019), 0.25 mg (10/19/2019), 0.25 mg (11/09/2019) ondansetron (ZOFRAN) injection 8 mg, 8 mg (100 % of original dose 8 mg), Intravenous,  Once, 3 of 3 cycles Dose modification: 8 mg (original dose 8 mg, Cycle 1) Administration: 8 mg (08/11/2019), 8 mg (08/31/2019), 8 mg (09/21/2019) pegfilgrastim (NEULASTA) injection 6 mg, 6 mg, Subcutaneous, Once, 1 of 1 cycle Administration: 6 mg (08/21/2019) pegfilgrastim (NEULASTA ONPRO KIT) injection 6 mg, 6 mg, Subcutaneous, Once, 5 of 6 cycles Administration: 6 mg (09/07/2019), 6 mg (09/28/2019), 6 mg  (10/19/2019), 6 mg (11/09/2019) ondansetron (ZOFRAN) 8 mg in sodium chloride 0.9 % 50 mL IVPB, , Intravenous,  Once, 2 of 3 cycles Administration: 8 mg (10/12/2019), 8 mg (11/02/2019) DOCEtaxel (TAXOTERE) 130 mg in sodium chloride 0.9 % 250 mL chemo infusion, 56.25 mg/m2 = 170 mg (75 % of original dose 100 mg/m2), Intravenous,  Once, 5 of 6 cycles Dose modification: 75 mg/m2 (75 % of original dose 100 mg/m2, Cycle 1, Reason: Provider Judgment), 75 mg/m2 (75 % of original dose 100 mg/m2, Cycle 2, Reason: Provider Judgment), 75 mg/m2 (original dose 100 mg/m2, Cycle 3, Reason: Provider Judgment), 66.6667 mg/m2 (66.7 % of original dose 100 mg/m2, Cycle 3, Reason: Other (see comments), Comment: neutropenia and elevated LFT), 66.6667 mg/m2 (66.7 % of original dose 100 mg/m2, Cycle 4, Reason: Change in LFTs) Administration: 130 mg (08/18/2019), 170 mg (09/07/2019), 150 mg (09/28/2019), 150 mg (10/19/2019), 150 mg (11/09/2019) gemcitabine (GEMZAR) 2,052 mg in sodium chloride 0.9 % 250 mL chemo infusion, 900 mg/m2 = 2,052 mg, Intravenous,  Once, 5 of 6 cycles Dose modification: 720 mg/m2 (80 % of original dose 900 mg/m2, Cycle 2, Reason: Provider Judgment) Administration: 2,052 mg (08/11/2019), 1,520 mg (08/18/2019), 1,634 mg (08/31/2019), 1,634 mg (09/07/2019), 1,634 mg (09/21/2019), 1,634 mg (09/28/2019), 1,634 mg (10/12/2019), 1,634 mg (10/19/2019), 1,634 mg (11/02/2019), 1,634 mg (11/09/2019)  for chemotherapy treatment.    Liposarcoma (HCC)  05/15/2019 Initial Diagnosis   Liposarcoma (HCC)   05/15/2019 Cancer Staging   Staging form: Primary Cutaneous B-Cell/T-Cell Lymphoma (Non-MF/SS Lymphoma), AJCC 8th Edition - Clinical stage from 05/15/2019: cT1a, cNX, pM1 - Signed by Katragadda, Sreedhar, MD on 05/15/2019     CANCER STAGING:   Cancer Staging Liposarcoma (HCC) Staging form: Primary Cutaneous B-Cell/T-Cell Lymphoma (Non-MF/SS Lymphoma), AJCC 8th Edition - Clinical stage from 05/15/2019: cT1a, cNX, pM1 - Signed by  Katragadda, Sreedhar, MD on 05/15/2019  Liposarcoma of left lower extremity (HCC) Staging form: Primary Cutaneous B-Cell/T-Cell Lymphoma (Non-MF/SS Lymphoma), AJCC 8th Edition - Clinical: No stage assigned - Unsigned   INTERVAL HISTORY:  [name], a 59 y.o. male, returns for routine follow-up and consideration for next cycle of chemotherapy. Leonard [name] was last seen on 11/02/2019.  Due for cycle #6 of gemcitabine today.   Today he is accompanied by his wife. Overall, he tells me he has been feeling poorly. He did not feel good after the 2 units of PRBC on 11/5 and he is still nauseated. He feels weak and nauseated for several days after the previous chemo. He had 1 episode of pain in his left chest wall and sternum yesterday and he continues having intermittent pain in his lower sternum and left flank. He is ambulating wobbly, but denies falls.  He will finish radiation on 11/17.  He will skip treatment until he finishes radiation and gets through Thanksgiving.   REVIEW OF SYSTEMS:  Review of Systems  Constitutional: Positive for appetite change (20%) and fatigue (depleted).  Respiratory: Positive for cough and shortness of breath.   Cardiovascular: Positive for chest pain and palpitations (occasional).  Gastrointestinal: Positive for constipation, diarrhea and nausea.  Musculoskeletal: Positive for gait problem (wobbly gait).  Neurological: Positive for dizziness and gait problem (wobbly gait).  Psychiatric/Behavioral: Positive for depression and sleep disturbance.  All other systems reviewed and are negative.   PAST MEDICAL/SURGICAL HISTORY:  Past Medical History:  Diagnosis Date  . Cancer (HCC)   . Port-A-Cath in place 05/19/2019   right   Past Surgical History:  Procedure Laterality Date  . CHOLECYSTECTOMY    . ESOPHAGEAL DILATION N/A 06/27/2017   Procedure: ESOPHAGEAL DILATION;  Surgeon: Fields, Sandi L, MD;  Location: [facility];  Service: Endoscopy;  Laterality:  N/A;  . ESOPHAGOGASTRODUODENOSCOPY N/A 06/27/2017   Procedure: ESOPHAGOGASTRODUODENOSCOPY (EGD);  Surgeon: Fields, Sandi L, MD;  Location: [facility];  Service: Endoscopy;  Laterality: N/A;  . IR IMAGING GUIDED PORT INSERTION  05/04/2019  . knee sx      SOCIAL HISTORY:  Social History   Socioeconomic History  . Marital status: Married    Spouse name: Not on file  . Number of children: 1  . Years of education: Not on file  . Highest education level: Not on file  Occupational History  . Occupation: Unemployed  Tobacco Use  . Smoking status: Former Smoker  . Smokeless tobacco: Never Used  Vaping Use  . Vaping Use: Never used  Substance and Sexual Activity  . Alcohol use: Yes    Comment: 1-2 drinks per month  . Drug use: Never  . Sexual activity: Not Currently  Other Topics Concern  . Not on file  Social History Narrative   DOES CONSTRUCTION. MARRIED. RARE ETOH. NO TOBACCO.   Social Determinants of Health   Financial Resource Strain: Medium Risk  . Difficulty of Paying Living Expenses: Somewhat hard  Food Insecurity: No Food Insecurity  . Worried About Running Out of Food in the Last Year: Never true  . Ran Out of Food in the Last Year: Never true  Transportation Needs: No Transportation Needs  . Lack of Transportation (Medical): No  . Lack of Transportation (Non-Medical): No  Physical Activity: Inactive  . Days of Exercise   per Week: 0 days  . Minutes of Exercise per Session: 0 min  Stress: Stress Concern Present  . Feeling of Stress : To some extent  Social Connections: Moderately Integrated  . Frequency of Communication with Friends and Family: More than three times a week  . Frequency of Social Gatherings with Friends and Family: Once a week  . Attends Religious Services: More than 4 times per year  . Active Member of Clubs or Organizations: No  . Attends Club or Organization Meetings: Never  . Marital Status: Married  Intimate Partner Violence: Not At Risk  .  Fear of Current or Ex-Partner: No  . Emotionally Abused: No  . Physically Abused: No  . Sexually Abused: No    FAMILY HISTORY:  Family History  Problem Relation Age of Onset  . Heart disease Father   . Cancer Maternal Aunt   . Cancer Maternal Uncle   . Cancer Paternal Uncle   . Vision loss Maternal Grandfather   . Colon cancer Neg Hx   . Colon polyps Neg Hx     CURRENT MEDICATIONS:  Current Outpatient Medications  Medication Sig Dispense Refill  . ALPRAZolam (XANAX) 0.25 MG tablet Take 1 tablet (0.25 mg total) by mouth 2 (two) times daily as needed for anxiety. 60 tablet 3  . aluminum-magnesium hydroxide-simethicone (MAALOX) 200-200-20 MG/5ML SUSP Swish and swallow 5 ml four times daily as needed for mouth sores. 600 mL 2  . DIPHENHIST 12.5 MG/5ML liquid SWISH AND SWALLOW ONETTEASPOONFUL 4 TIMES DAILYIAS NEEDED FOR MOUTH SORES.    . DOCEtaxel (TAXOTERE IV) Inject into the vein.    . escitalopram (LEXAPRO) 10 MG tablet Take 1 tablet (10 mg total) by mouth daily. 30 tablet 2  . Gemcitabine HCl (GEMZAR IV) Inject into the vein.    . HYDROcodone-homatropine (HYCODAN) 5-1.5 MG/5ML syrup SMARTSIG:1 Teaspoon By Mouth Every 6 Hours PRN 480 mL 0  . lidocaine (XYLOCAINE) 2 % solution SMARTSIG:1 Teaspoon By Mouth 4 Times Daily PRN    . Oxycodone HCl 10 MG TABS Take 0.5 tablets (5 mg total) by mouth every 8 (eight) hours as needed. 30 tablet 0   No current facility-administered medications for this visit.   Facility-Administered Medications Ordered in Other Visits  Medication Dose Route Frequency Provider Last Rate Last Admin  . sodium chloride flush (NS) 0.9 % injection 10 mL  10 mL Intravenous PRN Katragadda, Sreedhar, MD   10 mL at 08/21/19 1508    ALLERGIES:  No Known Allergies  PHYSICAL EXAM:  Performance status (ECOG): 1 - Symptomatic but completely ambulatory  Vitals:   11/23/19 0909  BP: 110/71  Pulse: (!) 113  Resp: 19  Temp: (!) 96.8 F (36 C)  SpO2: 96%   Wt  Readings from Last 3 Encounters:  11/23/19 226 lb 12.8 oz (102.9 kg)  11/17/19 231 lb 14.4 oz (105.2 kg)  11/09/19 233 lb 12.8 oz (106.1 kg)   Physical Exam Vitals reviewed.  Constitutional:      Appearance: Normal appearance. He is obese.  Cardiovascular:     Rate and Rhythm: Normal rate and regular rhythm.     Pulses: Normal pulses.     Heart sounds: Normal heart sounds.  Pulmonary:     Effort: Pulmonary effort is normal.     Breath sounds: Normal breath sounds.  Neurological:     General: No focal deficit present.     Mental Status: He is alert and oriented to person, place, and time.  Psychiatric:          Mood and Affect: Mood normal.        Behavior: Behavior normal.     LABORATORY DATA:  I have reviewed the labs as listed.  CBC Latest Ref Rng & Units 11/23/2019 11/17/2019 11/09/2019  WBC 4.0 - 10.5 K/uL 5.1 11.7(H) 1.9(L)  Hemoglobin 13.0 - 17.0 g/dL 13.4 5.9(LL) 10.8(L)  Hematocrit 39 - 52 % 42.4 19.4(L) 33.1(L)  Platelets 150 - 400 K/uL 295 62(L) 215   CMP Latest Ref Rng & Units 11/23/2019 11/17/2019 11/09/2019  Glucose 70 - 99 mg/dL 148(H) 128(H) 122(H)  BUN 6 - 20 mg/dL _0 Creatinine 0.61 - 1.24 mg/dL 0.85 0.80 0.78  Sodium 135 - 145 mmol/L 134(L) 137 136  Potassium 3.5 - 5.1 mmol/L 3.9 3.5 3.7  Chloride 98 - 111 mmol/L 101 103 104  CO2 22 - 32 mmol/L _1 Calcium 8.9 - 10.3 mg/dL 8.3(L) 8.2(L) 8.3(L)  Total Protein 6.5 - 8.1 g/dL 5.9(L) 5.3(L) 5.5(L)  Total Bilirubin 0.3 - 1.2 mg/dL 0.8 1.0 0.5  Alkaline Phos 38 - 126 U/L 117 141(H) 85  AST 15 - 41 U/L 32 36 49(H)  ALT 0 - 44 U/L 21 27 48(H)    DIAGNOSTIC IMAGING:  I have independently reviewed the scans and discussed with the patient. CT CHEST ABDOMEN PELVIS W CONTRAST  Result Date: 11/01/2019 CLINICAL DATA:  History of liposarcoma, follow-up de differentiated liposarcoma of the LEFT lower extremity. EXAM: CT CHEST, ABDOMEN, AND PELVIS WITH CONTRAST TECHNIQUE: Multidetector CT imaging of the  chest, abdomen and pelvis was performed following the standard protocol during bolus administration of intravenous contrast. CONTRAST:  116m OMNIPAQUE IOHEXOL 300 MG/ML  SOLN COMPARISON:  07/19/2019 FINDINGS: CT CHEST FINDINGS Cardiovascular: RIGHT-sided Port-A-Cath in situ terminates at the caval to atrial junction. Heart size is stable without pericardial effusion. Three-vessel coronary artery disease. Thoracic aorta is normal caliber with scattered atheromatous plaque. Central pulmonary vasculature unremarkable on venous phase assessment. Mediastinum/Nodes: No adenopathy in the chest. Lungs/Pleura: Numerous pulmonary lesions. A RIGHT lung lesion on image 62 of series 3 measuring 2 x 1.6 cm. Previously 1.5 x 1.4 cm. LEFT upper lobe lesion measuring 2.1 x 1.8 cm previously approximately 3.3 x 2.7 cm (image 31, series 3) Lingular lesion 2.2 x 2.3 cm (image 77, series 3) is previously 3.1 x 3.1 cm. Near complete resolution of small RIGHT upper lobe lesion in the medial RIGHT upper lobe with only bandlike area remaining on image 48 of series 3. This area previously measured 1.4 x 1.3 cm, currently 1 cm in length an approximately 3 mm short axis. RIGHT upper lobe lesion just above the fissure (image 93, series 3) 1.3 cm previously 2.6 cm. (Image 91, series 3) 6.8 x 4.3 cm lesion with low-density, previously approximately 7.3 by 2.8 cm. Tiny new lesion approximately 5 mm on image 73 of series 3 along the minor fissure. Musculoskeletal: New lytic focus with subacute fracture in the LEFT posterior fourth rib. Perhaps a subtle area on the most recent prior but no abnormality seen on the initial evaluation. CT ABDOMEN PELVIS FINDINGS Hepatobiliary: Hepatic steatosis. No focal hepatic lesion. Portal vein is patent. Post cholecystectomy without biliary duct dilation. Pancreas: Pancreas is normal without ductal dilation or sign of inflammation. Spleen: Spleen normal in size and contour without focal lesion. Adrenals/Urinary  Tract: Adrenal glands are normal. Symmetric renal enhancement. No hydronephrosis. Stable, approximately 1.5 cm low-density lesion in the interpolar LEFT kidney. Stomach/Bowel: No acute gastrointestinal process. Vascular/Lymphatic: Calcified atheromatous plaque of  the abdominal aorta without aneurysmal dilation. There is no gastrohepatic or hepatoduodenal ligament lymphadenopathy. No retroperitoneal or mesenteric lymphadenopathy. No pelvic sidewall lymphadenopathy. Reproductive: Prostate with mild heterogeneity, nonspecific on CT and unchanged. Other: No ascites. Musculoskeletal: Rib lesion as described above. 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 Subtle and unchanged. IMPRESSION: 1. On balance, interval improvement with respect to pulmonary metastatic disease but with new 5 mm nodule and with increase in size of 1 of the RIGHT upper lobe lesions and low attenuation along the major fissure in the RIGHT chest. Low attenuation along the major fissure in the RIGHT chest with shorter long axis dimension but with increase in overall volume as measured by this observer on the prior exam. 2. New lytic focus with subacute fracture in the LEFT posterior fourth rib. Perhaps a subtle area on the most recent prior but no abnormality seen on the initial evaluation. Attention on follow-up. 3. Hepatic steatosis. 4. Stable, approximately 1.5 cm low-density lesion in the interpolar LEFT kidney. This shows low-density but is mildly heterogeneous. Lesion is unchanged compared to previous studies, attention on follow-up. 5. 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 Subtle and unchanged. 6. Aortic atherosclerosis. Aortic Atherosclerosis (ICD10-I70.0). Electronically Signed   By: Zetta Bills M.D.   On: 11/01/2019 19:15     ASSESSMENT:  1. Metastatic dedifferentiated liposarcoma: -Left calf mass biopsy on 08/09/2018 consistent with grade 2 liposarcoma, CHOP gene rearrangement showed gene not  disrupted. -Chemoradiation therapy with gemcitabine and docetaxel followed by wide local excision. -Pathology on 11/10/2018 showed liposarcoma with 80% necrosis, grade 2, ypT4, NX, positive deep margin, amplification of MDM 2 detected. Amplification is associated with well-differentiated liposarcoma/atypical lipomatous tumors and dedifferentiated liposarcoma. -CT CAP in the ER on 04/24/2019 showed widespread pulmonary metastatic lesions throughout both lungs, largest left upper lobe mass measuring 4.7 x 4.2 cm. Mass in the inferior lingula measures 4.12 x 3.2 cm. -Bone scan on 05/02/2019 did not show any evidence of metastatic disease. -2D echo on 05/02/2019 shows EF 60 to 65%. -Adriamycin every 3 weeks started on 05/22/2019. -CT CAP on 07/19/2019 showed dominant lung lesions have decreased in size. 2 nodules in the right major fissure have progressed while some other pulmonary nodules are unchanged. No evidence of metastatic disease in the abdomen or pelvis. -Gemcitabine and docetaxel every 21 days started on 08/11/2019. -CT CAP on 11/01/2019 showed interval improvement in the pulmonary metastatic disease but new 5 mm nodule in the right upper lobe.  Right lower lobe lesion has increased in volume.  Lytic focus in the left posterior fourth rib is slightly prominent.  Hepatic steatosis.  2. Left kidney lesion: -CT on 04/24/2019 showed mass arising in the periphery of the upper lobe of the left kidney measuring 1.3 x 1.4 cm. -CT scan from [facility] in August 2020 was showing left kidney lesion. -Current scan on 07/19/2019 showed 15 mm hypoattenuating lesion in the upper pole left kidney stable since prior.   PLAN:  1. Metastatic dedifferentiated liposarcoma: -He is currently receiving radiation to the right lower lobe lung lesion. -He will receive total of 10 treatments. -I reviewed his labs.  Albumin is low at 3.9 and rest of LFTs are normal.  CBC was normal. -However he is feeling  severely fatigued.  I think the combination of chemoradiation is causing the fatigue.  Hence I have recommended to hold off on chemotherapy until after Thanksgiving. -RTC 3 weeks with labs and restarting of chemotherapy.  2. Dry cough: -This  has improved.  He is only taking Hycodan at bedtime for good sleep.  3. Anxiety: -Continue Xanax at bedtime as needed.  4. Right-sided chest wall pain: -Is not requiring pain medication on a daily basis.  5. Elevated blood sugar: -Hemoglobin A1c was 5.3.  6. Weight loss: -Continue boost 2 to 3 cans/day.  7. Depression: -Lexapro is helping with his mood.  8. Mucositis: -Continue Maalox with viscous lidocaine as needed.   Orders placed this encounter:  No orders of the defined types were placed in this encounter.    [name], MD [place] [phone]   I, Milinda Antis, am acting as a scribe for Dr. Sanda Linger.  I, [name] MD, have reviewed the above documentation for accuracy and completeness, and I agree with the above.

## 2019-11-23 NOTE — Patient Instructions (Signed)
[place] at [facility] Discharge Instructions  You were seen today by Dr. Delton Coombes. He went over your recent results. You did not receive treatment today and will skip treatment for the next 2 weeks. Continue being physically active as much as tolerable. Dr. Delton Coombes will see you back in 3 weeks for labs and follow up.   Thank you for choosing [place] at [facility] to provide your oncology and hematology care.  To afford each patient quality time with our provider, please arrive at least 15 minutes before your scheduled appointment time.   If you have a lab appointment with the [facility] please come in thru the Main Entrance and check in at the main information desk  You need to re-schedule your appointment should you arrive 10 or more minutes late.  We strive to give you quality time with our providers, and arriving late affects you and other patients whose appointments are after yours.  Also, if you no show three or more times for appointments you may be dismissed from the clinic at the providers discretion.     Again, thank you for choosing [facility].  Our hope is that these requests will decrease the amount of time that you wait before being seen by our physicians.       _____________________________________________________________  Should you have questions after your visit to [facility], please contact our office at [phone] between the hours of 8:00 a.m. and 4:30 p.m.  Voicemails left after 4:00 p.m. will not be returned until the following business day.  For prescription refill requests, have your pharmacy contact our office and allow 72 hours.    Cancer Center Support Programs:   > Cancer Support Group  2nd Tuesday of the month 1pm-2pm, Journey Room

## 2019-11-23 NOTE — Patient Instructions (Signed)
[facility] at [facility] Discharge Instructions  Labs drawn from portacath today   Thank you for choosing [facility] at [facility] to provide your oncology and hematology care.  To afford each patient quality time with our provider, please arrive at least 15 minutes before your scheduled appointment time.   If you have a lab appointment with the Cancer Center please come in thru the Main Entrance and check in at the main information desk.  You need to re-schedule your appointment should you arrive 10 or more minutes late.  We strive to give you quality time with our providers, and arriving late affects you and other patients whose appointments are after yours.  Also, if you no show three or more times for appointments you may be dismissed from the clinic at the providers discretion.     Again, thank you for choosing [facility].  Our hope is that these requests will decrease the amount of time that you wait before being seen by our physicians.       _____________________________________________________________  Should you have questions after your visit to [facility], please contact our office at [phone] and follow the prompts.  Our office hours are 8:00 a.m. and 4:30 p.m. Monday - Friday.  Please note that voicemails left after 4:00 p.m. may not be returned until the following business day.  We are closed weekends and major holidays.  You do have access to a nurse 24-7, just call the main number to the clinic [phone] and do not press any options, hold on the line and a nurse will answer the phone.    For prescription refill requests, have your pharmacy contact our office and allow 72 hours.    Due to Covid, you will need to wear a mask upon entering the hospital. If you do not have a mask, a mask will be given to you at the Main Entrance upon arrival. For doctor visits, patients may have 1 support person age 18  or older with them. For treatment visits, patients can not have anyone with them due to social distancing guidelines and our immunocompromised population.     

## 2019-11-23 NOTE — Progress Notes (Signed)
Per MD, hold treatment today. Pt to return in 3 weeks for labs and follow up. Port flushed as deaccessed per protocol. Discharged in satisfactory condition in company of wife.

## 2019-11-24 ENCOUNTER — Other Ambulatory Visit: Payer: Self-pay

## 2019-11-24 ENCOUNTER — Ambulatory Visit
Admission: RE | Admit: 2019-11-24 | Discharge: 2019-11-24 | Disposition: A | Discharge status: Home / Self Care | Payer: 59 | Source: Ambulatory Visit | Attending: Radiation Oncology | Admitting: Radiation Oncology

## 2019-11-24 ENCOUNTER — Encounter (HOSPITAL_COMMUNITY): Payer: 59

## 2019-11-24 DIAGNOSIS — C7652 Malignant neoplasm of left lower limb: Secondary | ICD-10-CM | POA: Diagnosis not present

## 2019-11-27 ENCOUNTER — Other Ambulatory Visit: Payer: Self-pay

## 2019-11-27 ENCOUNTER — Ambulatory Visit: Payer: 59 | Admitting: Radiation Oncology

## 2019-11-27 ENCOUNTER — Ambulatory Visit
Admission: RE | Admit: 2019-11-27 | Discharge: 2019-11-27 | Disposition: A | Discharge status: Home / Self Care | Payer: 59 | Source: Ambulatory Visit | Attending: Radiation Oncology | Admitting: Radiation Oncology

## 2019-11-27 DIAGNOSIS — C7652 Malignant neoplasm of left lower limb: Secondary | ICD-10-CM | POA: Diagnosis not present

## 2019-11-28 ENCOUNTER — Ambulatory Visit
Admission: RE | Admit: 2019-11-28 | Discharge: 2019-11-28 | Disposition: A | Discharge status: Home / Self Care | Payer: 59 | Source: Ambulatory Visit | Attending: Radiation Oncology | Admitting: Radiation Oncology

## 2019-11-28 DIAGNOSIS — C7652 Malignant neoplasm of left lower limb: Secondary | ICD-10-CM | POA: Diagnosis not present

## 2019-11-29 ENCOUNTER — Encounter: Payer: Self-pay | Admitting: Radiation Oncology

## 2019-11-29 ENCOUNTER — Ambulatory Visit
Admission: RE | Admit: 2019-11-29 | Discharge: 2019-11-29 | Disposition: A | Discharge status: Home / Self Care | Payer: 59 | Source: Ambulatory Visit | Attending: Radiation Oncology | Admitting: Radiation Oncology

## 2019-11-29 DIAGNOSIS — C7652 Malignant neoplasm of left lower limb: Secondary | ICD-10-CM | POA: Diagnosis not present

## 2019-11-30 ENCOUNTER — Ambulatory Visit: Payer: 59

## 2019-11-30 ENCOUNTER — Ambulatory Visit (HOSPITAL_COMMUNITY): Payer: 59

## 2019-11-30 ENCOUNTER — Other Ambulatory Visit (HOSPITAL_COMMUNITY): Payer: 59

## 2019-12-01 ENCOUNTER — Ambulatory Visit (HOSPITAL_COMMUNITY): Payer: 59

## 2019-12-01 NOTE — Progress Notes (Signed)
Nutrition Follow-up:   Patient with liposarcoma of left lower extremity.  Chemo on hold until after Thanksgiving.  Patient completed radiation on 11/17.    Spoke with patient via phone. Reports decreased appetite after receiving blood transfusion.  Reports food has no taste and full feeling at times.  Nausea is better.  Drinking 3-4 equate plus shakes per day.  Ate 2 eggs and toast so far today.  Denies mouth sores/pain.      Medications: reviewed  Labs: reviewed  Anthropometrics:   Weight 226 lb 12.8 oz on 11/11 decreased from 234 lb    NUTRITION DIAGNOSIS: Inadequate oral intake continues    INTERVENTION:  Encouraged patient to drink 4 equate plus shakes per day, if able. Encouraged patient to experiment with different flavors for better taste.  Encouraged small frequent meals    MONITORING, EVALUATION, GOAL: weight trends, intake   NEXT VISIT: Dec 17 phone f/u  Leonard [name] B. Zenia Resides, [place], [place] Registered Dietitian [phone] (mobile)

## 2019-12-13 ENCOUNTER — Other Ambulatory Visit: Payer: Self-pay

## 2019-12-13 ENCOUNTER — Inpatient Hospital Stay (HOSPITAL_COMMUNITY): Payer: 59 | Attending: Oncology

## 2019-12-13 ENCOUNTER — Other Ambulatory Visit (HOSPITAL_COMMUNITY): Payer: Self-pay | Admitting: *Deleted

## 2019-12-13 ENCOUNTER — Inpatient Hospital Stay (HOSPITAL_BASED_OUTPATIENT_CLINIC_OR_DEPARTMENT_OTHER): Payer: 59 | Admitting: Hematology

## 2019-12-13 ENCOUNTER — Inpatient Hospital Stay (HOSPITAL_COMMUNITY): Payer: 59

## 2019-12-13 VITALS — BP 125/80 | HR 100 | Temp 97.0°F | Resp 20 | Wt 217.0 lb

## 2019-12-13 DIAGNOSIS — Z87891 Personal history of nicotine dependence: Secondary | ICD-10-CM | POA: Insufficient documentation

## 2019-12-13 DIAGNOSIS — Z5189 Encounter for other specified aftercare: Secondary | ICD-10-CM | POA: Insufficient documentation

## 2019-12-13 DIAGNOSIS — C4922 Malignant neoplasm of connective and soft tissue of left lower limb, including hip: Secondary | ICD-10-CM

## 2019-12-13 DIAGNOSIS — Z5111 Encounter for antineoplastic chemotherapy: Secondary | ICD-10-CM | POA: Insufficient documentation

## 2019-12-13 DIAGNOSIS — R0781 Pleurodynia: Secondary | ICD-10-CM | POA: Insufficient documentation

## 2019-12-13 DIAGNOSIS — F418 Other specified anxiety disorders: Secondary | ICD-10-CM | POA: Insufficient documentation

## 2019-12-13 DIAGNOSIS — R630 Anorexia: Secondary | ICD-10-CM | POA: Insufficient documentation

## 2019-12-13 DIAGNOSIS — K1230 Oral mucositis (ulcerative), unspecified: Secondary | ICD-10-CM | POA: Diagnosis not present

## 2019-12-13 DIAGNOSIS — C499 Malignant neoplasm of connective and soft tissue, unspecified: Secondary | ICD-10-CM

## 2019-12-13 DIAGNOSIS — R42 Dizziness and giddiness: Secondary | ICD-10-CM | POA: Insufficient documentation

## 2019-12-13 DIAGNOSIS — R0789 Other chest pain: Secondary | ICD-10-CM | POA: Diagnosis not present

## 2019-12-13 DIAGNOSIS — Z79899 Other long term (current) drug therapy: Secondary | ICD-10-CM | POA: Diagnosis not present

## 2019-12-13 DIAGNOSIS — R600 Localized edema: Secondary | ICD-10-CM | POA: Diagnosis not present

## 2019-12-13 DIAGNOSIS — R5383 Other fatigue: Secondary | ICD-10-CM | POA: Insufficient documentation

## 2019-12-13 DIAGNOSIS — K760 Fatty (change of) liver, not elsewhere classified: Secondary | ICD-10-CM | POA: Diagnosis not present

## 2019-12-13 DIAGNOSIS — R519 Headache, unspecified: Secondary | ICD-10-CM | POA: Diagnosis not present

## 2019-12-13 DIAGNOSIS — R059 Cough, unspecified: Secondary | ICD-10-CM | POA: Insufficient documentation

## 2019-12-13 DIAGNOSIS — C7800 Secondary malignant neoplasm of unspecified lung: Secondary | ICD-10-CM | POA: Insufficient documentation

## 2019-12-13 DIAGNOSIS — Z95828 Presence of other vascular implants and grafts: Secondary | ICD-10-CM

## 2019-12-13 DIAGNOSIS — R0609 Other forms of dyspnea: Secondary | ICD-10-CM | POA: Diagnosis not present

## 2019-12-13 DIAGNOSIS — R634 Abnormal weight loss: Secondary | ICD-10-CM | POA: Insufficient documentation

## 2019-12-13 DIAGNOSIS — G479 Sleep disorder, unspecified: Secondary | ICD-10-CM | POA: Diagnosis not present

## 2019-12-13 LAB — CBC WITH DIFFERENTIAL/PLATELET
Abs Immature Granulocytes: 0.01 10*3/uL (ref 0.00–0.07)
Basophils Absolute: 0 10*3/uL (ref 0.0–0.1)
Basophils Relative: 1 %
Eosinophils Absolute: 1.1 10*3/uL — ABNORMAL HIGH (ref 0.0–0.5)
Eosinophils Relative: 20 %
HCT: 43 % (ref 39.0–52.0)
Hemoglobin: 13.7 g/dL (ref 13.0–17.0)
Immature Granulocytes: 0 %
Lymphocytes Relative: 6 %
Lymphs Abs: 0.3 10*3/uL — ABNORMAL LOW (ref 0.7–4.0)
MCH: 31.6 pg (ref 26.0–34.0)
MCHC: 31.9 g/dL (ref 30.0–36.0)
MCV: 99.3 fL (ref 80.0–100.0)
Monocytes Absolute: 0.5 10*3/uL (ref 0.1–1.0)
Monocytes Relative: 10 %
Neutro Abs: 3.3 10*3/uL (ref 1.7–7.7)
Neutrophils Relative %: 63 %
Platelets: 188 10*3/uL (ref 150–400)
RBC: 4.33 MIL/uL (ref 4.22–5.81)
RDW: 18.5 % — ABNORMAL HIGH (ref 11.5–15.5)
WBC: 5.3 10*3/uL (ref 4.0–10.5)
nRBC: 0 % (ref 0.0–0.2)

## 2019-12-13 LAB — COMPREHENSIVE METABOLIC PANEL
ALT: 29 U/L (ref 0–44)
AST: 40 U/L (ref 15–41)
Albumin: 3.4 g/dL — ABNORMAL LOW (ref 3.5–5.0)
Alkaline Phosphatase: 120 U/L (ref 38–126)
Anion gap: 9 (ref 5–15)
BUN: 15 mg/dL (ref 6–20)
CO2: 23 mmol/L (ref 22–32)
Calcium: 8.7 mg/dL — ABNORMAL LOW (ref 8.9–10.3)
Chloride: 104 mmol/L (ref 98–111)
Creatinine, Ser: 0.75 mg/dL (ref 0.61–1.24)
GFR, Estimated: >60 mL/min (ref >60)
Glucose, Bld: 141 mg/dL — ABNORMAL HIGH (ref 70–99)
Potassium: 3.7 mmol/L (ref 3.5–5.1)
Sodium: 136 mmol/L (ref 135–145)
Total Bilirubin: 1.1 mg/dL (ref 0.3–1.2)
Total Protein: 6.2 g/dL — ABNORMAL LOW (ref 6.5–8.1)

## 2019-12-13 MED ORDER — HEPARIN SOD (PORK) LOCK FLUSH 100 UNIT/ML IV SOLN
500.0000 [IU] | Freq: Once | INTRAVENOUS | Status: AC | PRN
  Administered 2019-12-13: 500 [IU]

## 2019-12-13 MED ORDER — SODIUM CHLORIDE 0.9% FLUSH
10.0000 mL | INTRAVENOUS | Status: DC | PRN
  Administered 2019-12-13: 10 mL

## 2019-12-13 MED ORDER — SODIUM CHLORIDE 0.9 % IV SOLN
720.0000 mg/m2 | Freq: Once | INTRAVENOUS | Status: AC
  Administered 2019-12-13: 1634 mg via INTRAVENOUS
  Filled 2019-12-13: qty 16.68

## 2019-12-13 MED ORDER — SODIUM CHLORIDE 0.9 % IV SOLN
Freq: Once | INTRAVENOUS | Status: AC
  Administered 2019-12-13: 8 mg via INTRAVENOUS
  Filled 2019-12-13: qty 4

## 2019-12-13 MED ORDER — SODIUM CHLORIDE 0.9 % IV SOLN: Freq: Once | INTRAVENOUS | Status: AC

## 2019-12-13 MED ORDER — HYDROCODONE-HOMATROPINE 5-1.5 MG/5ML PO SYRP: ORAL_SOLUTION | ORAL | 0 refills | Status: DC

## 2019-12-13 NOTE — Progress Notes (Signed)
Treatment given today per MD orders. Tolerated infusion without adverse affects. Vital signs stable. No complaints at this time. Discharged from clinic ambulatory in stable condition. Alert and oriented x 3. F/U with [facility] as scheduled.

## 2019-12-13 NOTE — Progress Notes (Signed)
[place] [address], [place] [address]   CLINIC:  Medical Oncology/Hematology  PCP:  Celene Squibb, MD [address] [name] [place] [address] [phone]   REASON FOR VISIT:  Follow-up for metastatic dedifferentiated liposarcoma  PRIOR THERAPY:  1. Gemcitabine & docetaxel followed by wide local excision on 11/10/2018. 2. SBRT to right lung 50 Gy in 10 fractions from 11/14/2019 to 11/29/2019.  NGS Results: Pending  CURRENT THERAPY: Gemcitabine 2 weeks on, 1 week off  BRIEF ONCOLOGIC HISTORY:  Oncology History  Liposarcoma of left lower extremity ([place])  04/25/2019 Initial Diagnosis   Liposarcoma of left lower extremity ([place])   05/22/2019 - 07/04/2019 Chemotherapy   The patient had DOXOrubicin (ADRIAMYCIN) chemo injection 174 mg, 75 mg/m2 = 174 mg, Intravenous,  Once, 3 of 6 cycles Administration: 174 mg (05/22/2019), 174 mg (06/13/2019), 174 mg (07/04/2019) palonosetron (ALOXI) injection 0.25 mg, 0.25 mg, Intravenous,  Once, 3 of 6 cycles Administration: 0.25 mg (05/22/2019), 0.25 mg (06/13/2019), 0.25 mg (07/04/2019) fosaprepitant (EMEND) 150 mg in sodium chloride 0.9 % 145 mL IVPB, 150 mg, Intravenous,  Once, 3 of 6 cycles Administration: 150 mg (05/22/2019), 150 mg (06/13/2019), 150 mg (07/04/2019)  for chemotherapy treatment.    08/11/2019 -  Chemotherapy   The patient had palonosetron (ALOXI) injection 0.25 mg, 0.25 mg, Intravenous,  Once, 5 of 6 cycles Administration: 0.25 mg (08/18/2019), 0.25 mg (09/07/2019), 0.25 mg (09/28/2019), 0.25 mg (10/19/2019), 0.25 mg (11/09/2019) ondansetron (ZOFRAN) injection 8 mg, 8 mg (100 % of original dose 8 mg), Intravenous,  Once, 3 of 3 cycles Dose modification: 8 mg (original dose 8 mg, Cycle 1) Administration: 8 mg (08/11/2019), 8 mg (08/31/2019), 8 mg (09/21/2019) pegfilgrastim (NEULASTA) injection 6 mg, 6 mg, Subcutaneous, Once, 1 of 1 cycle Administration: 6 mg (08/21/2019) pegfilgrastim (NEULASTA ONPRO KIT) injection 6  mg, 6 mg, Subcutaneous, Once, 5 of 6 cycles Administration: 6 mg (09/07/2019), 6 mg (09/28/2019), 6 mg (10/19/2019), 6 mg (11/09/2019) ondansetron (ZOFRAN) 8 mg in sodium chloride 0.9 % 50 mL IVPB, , Intravenous,  Once, 2 of 3 cycles Administration: 8 mg (10/12/2019), 8 mg (11/02/2019) DOCEtaxel (TAXOTERE) 130 mg in sodium chloride 0.9 % 250 mL chemo infusion, 56.25 mg/m2 = 170 mg (75 % of original dose 100 mg/m2), Intravenous,  Once, 5 of 6 cycles Dose modification: 75 mg/m2 (75 % of original dose 100 mg/m2, Cycle 1, Reason: Provider Judgment), 75 mg/m2 (75 % of original dose 100 mg/m2, Cycle 2, Reason: Provider Judgment), 75 mg/m2 (original dose 100 mg/m2, Cycle 3, Reason: Provider Judgment), 81.8299 mg/m2 (66.7 % of original dose 100 mg/m2, Cycle 3, Reason: Other (see comments), Comment: neutropenia and elevated LFT), 66.6667 mg/m2 (66.7 % of original dose 100 mg/m2, Cycle 4, Reason: Change in LFTs) Administration: 130 mg (08/18/2019), 170 mg (09/07/2019), 150 mg (09/28/2019), 150 mg (10/19/2019), 150 mg (11/09/2019) gemcitabine (GEMZAR) 2,052 mg in sodium chloride 0.9 % 250 mL chemo infusion, 900 mg/m2 = 2,052 mg, Intravenous,  Once, 5 of 6 cycles Dose modification: 720 mg/m2 (80 % of original dose 900 mg/m2, Cycle 2, Reason: Provider Judgment) Administration: 2,052 mg (08/11/2019), 1,520 mg (08/18/2019), 1,634 mg (08/31/2019), 1,634 mg (09/07/2019), 1,634 mg (09/21/2019), 1,634 mg (09/28/2019), 1,634 mg (10/12/2019), 1,634 mg (10/19/2019), 1,634 mg (11/02/2019), 1,634 mg (11/09/2019)  for chemotherapy treatment.    Liposarcoma ([place])  05/15/2019 Initial Diagnosis   Liposarcoma ([place])   05/15/2019 Cancer Staging   Staging form: Primary Cutaneous B-Cell/T-Cell Lymphoma (Non-MF/SS Lymphoma), AJCC 8th Edition -  Clinical stage from 05/15/2019: [name], pM1 - Signed by [name], MD on 05/15/2019     CANCER STAGING: Cancer Staging Liposarcoma [facility]) Staging form: Primary Cutaneous B-Cell/T-Cell Lymphoma  (Non-MF/SS Lymphoma), AJCC 8th Edition - Clinical stage from 05/15/2019: [name], pM1 - Signed by [name], MD on 05/15/2019  Liposarcoma of left lower extremity [facility]) Staging form: Primary Cutaneous B-Cell/T-Cell Lymphoma (Non-MF/SS Lymphoma), AJCC 8th Edition - Clinical: No stage assigned - Unsigned   INTERVAL HISTORY:  [name], a 59 y.o. male, returns for routine follow-up and consideration for next cycle of chemotherapy. Adlai was last seen on 11/23/2019.  Due for cycle #6 of gemcitabine today.   Today he is accompanied by his wife. Overall, he tells me he has been feeling okay. He reports that his dry cough has returned after finishing radiation and reports having coughing spells that cause CP in his lateral ribs. He is having trouble falling asleep and takes Xanax, though he thinks he is taking it too late. He denies having numbness or tingling. He reports feeling hungry but also has early satiety; his appetite has slightly improved, though he only eats small portions. He is drinking 2-3 cans of Boost daily and he denies mouth sores or sore throat.  He is inquiring today about getting his booster vaccine.  Overall, he feels ready for next cycle of chemo today.    REVIEW OF SYSTEMS:  Review of Systems  Constitutional: Positive for appetite change (50%) and fatigue (25%).  HENT:   Negative for mouth sores and sore throat.   Respiratory: Positive for cough and shortness of breath (w/ exertion).   Cardiovascular: Positive for chest pain (lateral ribs sore from coughing).  Gastrointestinal: Positive for nausea.  Neurological: Positive for dizziness and headaches (occasional). Negative for numbness.  Psychiatric/Behavioral: Positive for sleep disturbance.  All other systems reviewed and are negative.   PAST MEDICAL/SURGICAL HISTORY:  Past Medical History:  Diagnosis Date  . Cancer ([place])   . Port-A-Cath in place 05/19/2019   right   Past Surgical History:    Procedure Laterality Date  . CHOLECYSTECTOMY    . ESOPHAGEAL DILATION N/A 06/27/2017   Procedure: ESOPHAGEAL DILATION;  Surgeon: Danie Binder, MD;  [facility];  Service: Endoscopy;  Laterality: N/A;  . ESOPHAGOGASTRODUODENOSCOPY N/A 06/27/2017   Procedure: ESOPHAGOGASTRODUODENOSCOPY (EGD);  Surgeon: Danie Binder, MD;  Location: [facility];  Service: Endoscopy;  Laterality: N/A;  . IR IMAGING GUIDED PORT INSERTION  05/04/2019  . knee sx      SOCIAL HISTORY:  Social History   Socioeconomic History  . Marital status: Married    Spouse name: Not on file  . Number of children: 1  . Years of education: Not on file  . Highest education level: Not on file  Occupational History  . Occupation: Unemployed  Tobacco Use  . Smoking status: Former Research scientist (life sciences)  . Smokeless tobacco: Never Used  Vaping Use  . Vaping Use: Never used  Substance and Sexual Activity  . Alcohol use: Yes    Comment: 1-2 drinks per month  . Drug use: Never  . Sexual activity: Not Currently  Other Topics Concern  . Not on file  Social History Narrative   DOES CONSTRUCTION. MARRIED. RARE ETOH. NO TOBACCO.   Social Determinants of Health   Financial Resource Strain: Medium Risk  . Difficulty of Paying Living Expenses: Somewhat hard  Food Insecurity: No Food Insecurity  . Worried About Charity fundraiser  in the Last Year: Never true  . Ran Out of Food in the Last Year: Never true  Transportation Needs: No Transportation Needs  . Lack of Transportation (Medical): No  . Lack of Transportation (Non-Medical): No  Physical Activity: Inactive  . Days of Exercise per Week: 0 days  . Minutes of Exercise per Session: 0 min  Stress: Stress Concern Present  . Feeling of Stress : To some extent  Social Connections: Moderately Integrated  . Frequency of Communication with Friends and Family: More than three times a week  . Frequency of Social Gatherings with Friends and Family: Once a week  . Attends  Religious Services: More than 4 times per year  . Active Member of Clubs or Organizations: No  . Attends Archivist Meetings: Never  . Marital Status: Married  Human resources officer Violence: Not At Risk  . Fear of Current or Ex-Partner: No  . Emotionally Abused: No  . Physically Abused: No  . Sexually Abused: No    FAMILY HISTORY:  Family History  Problem Relation Age of Onset  . Heart disease Father   . Cancer Maternal Aunt   . Cancer Maternal Uncle   . Cancer Paternal Uncle   . Vision loss Maternal Grandfather   . Colon cancer Neg Hx   . Colon polyps Neg Hx     CURRENT MEDICATIONS:  Current Outpatient Medications  Medication Sig Dispense Refill  . ALPRAZolam (XANAX) 0.25 MG tablet Take 1 tablet (0.25 mg total) by mouth 2 (two) times daily as needed for anxiety. 60 tablet 3  . aluminum-magnesium hydroxide-simethicone (MAALOX) 096-438-38 MG/5ML SUSP Swish and swallow 5 ml four times daily as needed for mouth sores. 600 mL 2  . DOCEtaxel (TAXOTERE IV) Inject into the vein.    Marland Kitchen escitalopram (LEXAPRO) 10 MG tablet Take 1 tablet (10 mg total) by mouth daily. 30 tablet 2  . Gemcitabine HCl (GEMZAR IV) Inject into the vein.    Marland Kitchen HYDROcodone-homatropine (HYCODAN) 5-1.5 MG/5ML syrup SMARTSIG:1 Teaspoon By Mouth Every 6 Hours PRN 480 mL 0  . lidocaine (XYLOCAINE) 2 % solution SMARTSIG:1 Teaspoon By Mouth 4 Times Daily PRN    . Oxycodone HCl 10 MG TABS Take 0.5 tablets (5 mg total) by mouth every 8 (eight) hours as needed. 30 tablet 0   No current facility-administered medications for this visit.   Facility-Administered Medications Ordered in Other Visits  Medication Dose Route Frequency Provider Last Rate Last Admin  . sodium chloride flush (NS) 0.9 % injection 10 mL  10 mL Intravenous PRN [name], MD   10 mL at 08/21/19 1508    ALLERGIES:  No Known Allergies  PHYSICAL EXAM:  Performance status (ECOG): 1 - Symptomatic but completely ambulatory  Vitals:    12/13/19 0838  BP: 125/80  Pulse: 100  Resp: 20  Temp: (!) 97 F (36.1 C)  SpO2: 97%   Wt Readings from Last 3 Encounters:  12/13/19 217 lb (98.4 kg)  11/23/19 226 lb 12.8 oz (102.9 kg)  11/17/19 231 lb 14.4 oz (105.2 kg)   Physical Exam Vitals reviewed.  Constitutional:      Appearance: Normal appearance. He is obese.  Cardiovascular:     Rate and Rhythm: Normal rate and regular rhythm.     Pulses: Normal pulses.     Heart sounds: Normal heart sounds.  Pulmonary:     Effort: Pulmonary effort is normal.     Breath sounds: Normal breath sounds.  Chest:  Chest wall: No tenderness.     Comments: Port-a-Cath in R chest Abdominal:     Palpations: Abdomen is soft.     Tenderness: There is no abdominal tenderness.  Musculoskeletal:     Right lower leg: No edema.     Left lower leg: No edema.  Neurological:     General: No focal deficit present.     Mental Status: He is alert and oriented to person, place, and time.  Psychiatric:        Mood and Affect: Mood normal.        Behavior: Behavior normal.     LABORATORY DATA:  I have reviewed the labs as listed.  CBC Latest Ref Rng & Units 12/13/2019 11/23/2019 11/17/2019  WBC 4.0 - 10.5 K/uL 5.3 5.1 11.7(H)  Hemoglobin 13.0 - 17.0 g/dL 13.7 13.4 5.9(LL)  Hematocrit 39 - 52 % 43.0 42.4 19.4(L)  Platelets 150 - 400 K/uL 188 295 62(L)   CMP Latest Ref Rng & Units 12/13/2019 11/23/2019 11/17/2019  Glucose 70 - 99 mg/dL 141(H) 148(H) 128(H)  BUN 6 - 20 mg/dL _0 Creatinine 0.61 - 1.24 mg/dL 0.75 0.85 0.80  Sodium 135 - 145 mmol/L 136 134(L) 137  Potassium 3.5 - 5.1 mmol/L 3.7 3.9 3.5  Chloride 98 - 111 mmol/L 104 101 103  CO2 22 - 32 mmol/L _1 Calcium 8.9 - 10.3 mg/dL 8.7(L) 8.3(L) 8.2(L)  Total Protein 6.5 - 8.1 g/dL 6.2(L) 5.9(L) 5.3(L)  Total Bilirubin 0.3 - 1.2 mg/dL 1.1 0.8 1.0  Alkaline Phos 38 - 126 U/L 120 117 141(H)  AST 15 - 41 U/L 40 32 36  ALT 0 - 44 U/L _2 DIAGNOSTIC IMAGING:  I have  independently reviewed the scans and discussed with the patient. No results found.   ASSESSMENT:  1. 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 -Chemoradiation therapy with gemcitabine and docetaxel followed by wide local excision. -Pathology on 11/10/2018 showed liposarcoma with 80% necrosis, grade 2, ypT4, NX, positive deep margin, amplification of MDM 2 detected. Amplification is associated with well-differentiated liposarcoma/atypical lipomatous tumors and dedifferentiated liposarcoma. -CT CAP in the ER on 04/24/2019 showed widespread pulmonary metastatic lesions throughout both lungs, largest left upper lobe mass measuring 4.7 x 4.2 cm. Mass in the inferior lingula measures 4.12 x 3.2 cm. -Bone scan on 05/02/2019 did not show any evidence of metastatic disease. -2D echo on 05/02/2019 shows EF 60 to 65%. -Adriamycin every 3 weeks started on 05/22/2019. -CT CAP on 07/19/2019 showed dominant lung lesions have decreased in size. 2 nodules in the right major fissure have progressed while some other pulmonary nodules are unchanged. No evidence of metastatic disease in the abdomen or pelvis. -Gemcitabine and docetaxel every 21 days started on 08/11/2019. -CT CAP on 11/01/2019 showed interval improvement in the pulmonary metastatic disease but new 5 mm nodule in the right upper lobe. Right lower lobe lesion has increased in volume. Lytic focus in the left posterior fourth rib is slightly prominent. Hepatic steatosis.  2. Left kidney lesion: -CT on 04/24/2019 showed mass arising in the periphery of the upper lobe of the left kidney measuring 1.3 x 1.4 cm. -CT scan from [facility] in August 2020 was showing left kidney lesion. -Current scan on 07/19/2019 showed 15 mm hypoattenuating lesion in the upper pole left kidney stable since prior.   PLAN:  1.  Metastatic  dedifferentiated liposarcoma: -He has been off of therapy for the last few weeks.  He has completed radiation therapy to the lung lesions. -His energy levels have improved since he has been off of treatment. -Reviewed his labs today which showed normal CBC and LFTs.  Albumin is slightly low at 3.4. -Proceed with cycle 6-day 1 of gemcitabine and docetaxel (already dose reduced). -RTC 3 weeks for follow-up for cycle 7. -Plan to repeat CT CAP after cycle 7.  2. Dry cough: -He started coughing again since he completed radiation therapy.  Most likely radiation-induced. -We will start him on Hycodan.  3. Anxiety: -Continue Xanax at bedtime as needed.  4. Right-sided chest wall pain: -He started having bilateral rib pains, since he started coughing in the last 1 to 2 weeks.  5. Weight loss: -He has been eating well.  Continue boost 2 to 3 cans/day.  6. Depression: -Continue Lexapro.  7. Mucositis: -Continue Maalox with viscous lidocaine as needed.   Orders placed this encounter:  No orders of the defined types were placed in this encounter.    [name], MD [place] [phone]   I, Milinda Antis, am acting as a scribe for Dr. Sanda Linger.  I, [name] MD, have reviewed the above documentation for accuracy and completeness, and I agree with the above.

## 2019-12-13 NOTE — Progress Notes (Signed)
Patients port flushed without difficulty.  Good blood return noted with no bruising or swelling noted at site.  Transparent dressing applied.  Patient left accessed for chemotherapy treatment. 

## 2019-12-13 NOTE — Patient Instructions (Signed)
[facility] Discharge Instructions for Patients Receiving Chemotherapy  Today you received the following chemotherapy agents   To help prevent nausea and vomiting after your treatment, we encourage you to take your nausea medication   If you develop nausea and vomiting that is not controlled by your nausea medication, call the clinic.   BELOW ARE SYMPTOMS THAT SHOULD BE REPORTED IMMEDIATELY:  *FEVER GREATER THAN 100.5 F  *CHILLS WITH OR WITHOUT FEVER  NAUSEA AND VOMITING THAT IS NOT CONTROLLED WITH YOUR NAUSEA MEDICATION  *UNUSUAL SHORTNESS OF BREATH  *UNUSUAL BRUISING OR BLEEDING  TENDERNESS IN MOUTH AND THROAT WITH OR WITHOUT PRESENCE OF ULCERS  *URINARY PROBLEMS  *BOWEL PROBLEMS  UNUSUAL RASH Items with * indicate a potential emergency and should be followed up as soon as possible.  Feel free to call the clinic should you have any questions or concerns. The clinic phone number is [phone].  Please show the CHEMO ALERT CARD at check-in to the Emergency Department and triage nurse.   

## 2019-12-13 NOTE — Progress Notes (Signed)
Patient presents today for treatment. Vital signs stable.  Patient complains of constant fatigue no relieved with rest.  Patient also complains that he had some nausea this morning that waxes and wanes.  Labs reviewed and within parameters for treatment.

## 2019-12-13 NOTE — Progress Notes (Signed)
Patient was assessed by Dr. Katragadda and labs have been reviewed.  Patient is okay to proceed with treatment today. Primary RN and pharmacy aware.   

## 2019-12-13 NOTE — Patient Instructions (Addendum)
[place] at [facility] Discharge Instructions  You were seen today by Dr. Delton Coombes. He went over your recent results. You received your treatment today; your next treatment will be next week. Take your Xanax 30 minutes to 1 hour prior to going to sleep. Dr. Delton Coombes will see you back in 3 weeks for labs and follow up.   Thank you for choosing [place] at [facility] to provide your oncology and hematology care.  To afford each patient quality time with our provider, please arrive at least 15 minutes before your scheduled appointment time.   If you have a lab appointment with the [facility] please come in thru the Main Entrance and check in at the main information desk  You need to re-schedule your appointment should you arrive 10 or more minutes late.  We strive to give you quality time with our providers, and arriving late affects you and other patients whose appointments are after yours.  Also, if you no show three or more times for appointments you may be dismissed from the clinic at the providers discretion.     Again, thank you for choosing [facility].  Our hope is that these requests will decrease the amount of time that you wait before being seen by our physicians.       _____________________________________________________________  Should you have questions after your visit to [facility], please contact our office at [phone] between the hours of 8:00 a.m. and 4:30 p.m.  Voicemails left after 4:00 p.m. will not be returned until the following business day.  For prescription refill requests, have your pharmacy contact our office and allow 72 hours.    Cancer Center Support Programs:   > Cancer Support Group  2nd Tuesday of the month 1pm-2pm, Journey Room

## 2019-12-14 ENCOUNTER — Telehealth (HOSPITAL_COMMUNITY): Payer: Self-pay

## 2019-12-14 NOTE — Telephone Encounter (Signed)
Patient called and left message stating that his prescription for his cough syrup had not been called in to the pharmacy.  Returned call to patient concerning his prescription for Hycodan. Advised patient that the prescription was at [facility] [address] for pickup.  Patient had no further questions at this time.

## 2019-12-20 ENCOUNTER — Other Ambulatory Visit: Payer: Self-pay

## 2019-12-20 ENCOUNTER — Inpatient Hospital Stay (HOSPITAL_COMMUNITY): Payer: 59

## 2019-12-20 ENCOUNTER — Encounter (HOSPITAL_COMMUNITY): Payer: Self-pay

## 2019-12-20 DIAGNOSIS — C4922 Malignant neoplasm of connective and soft tissue of left lower limb, including hip: Secondary | ICD-10-CM

## 2019-12-20 DIAGNOSIS — Z5111 Encounter for antineoplastic chemotherapy: Secondary | ICD-10-CM | POA: Diagnosis not present

## 2019-12-20 DIAGNOSIS — Z95828 Presence of other vascular implants and grafts: Secondary | ICD-10-CM

## 2019-12-20 LAB — CBC WITH DIFFERENTIAL/PLATELET
Abs Immature Granulocytes: 0.01 10*3/uL (ref 0.00–0.07)
Basophils Absolute: 0 10*3/uL (ref 0.0–0.1)
Basophils Relative: 1 %
Eosinophils Absolute: 0.2 10*3/uL (ref 0.0–0.5)
Eosinophils Relative: 14 %
HCT: 36.9 % — ABNORMAL LOW (ref 39.0–52.0)
Hemoglobin: 12 g/dL — ABNORMAL LOW (ref 13.0–17.0)
Immature Granulocytes: 1 %
Lymphocytes Relative: 17 %
Lymphs Abs: 0.3 10*3/uL — ABNORMAL LOW (ref 0.7–4.0)
MCH: 31.8 pg (ref 26.0–34.0)
MCHC: 32.5 g/dL (ref 30.0–36.0)
MCV: 97.9 fL (ref 80.0–100.0)
Monocytes Absolute: 0.2 10*3/uL (ref 0.1–1.0)
Monocytes Relative: 15 %
Neutro Abs: 0.8 10*3/uL — ABNORMAL LOW (ref 1.7–7.7)
Neutrophils Relative %: 52 %
Platelets: 101 10*3/uL — ABNORMAL LOW (ref 150–400)
RBC: 3.77 MIL/uL — ABNORMAL LOW (ref 4.22–5.81)
RDW: 16.9 % — ABNORMAL HIGH (ref 11.5–15.5)
WBC: 1.5 10*3/uL — ABNORMAL LOW (ref 4.0–10.5)
nRBC: 0 % (ref 0.0–0.2)

## 2019-12-20 LAB — COMPREHENSIVE METABOLIC PANEL
ALT: 48 U/L — ABNORMAL HIGH (ref 0–44)
AST: 54 U/L — ABNORMAL HIGH (ref 15–41)
Albumin: 3.3 g/dL — ABNORMAL LOW (ref 3.5–5.0)
Alkaline Phosphatase: 127 U/L — ABNORMAL HIGH (ref 38–126)
Anion gap: 8 (ref 5–15)
BUN: 13 mg/dL (ref 6–20)
CO2: 24 mmol/L (ref 22–32)
Calcium: 8.6 mg/dL — ABNORMAL LOW (ref 8.9–10.3)
Chloride: 103 mmol/L (ref 98–111)
Creatinine, Ser: 0.65 mg/dL (ref 0.61–1.24)
GFR, Estimated: >60 mL/min (ref >60)
Glucose, Bld: 123 mg/dL — ABNORMAL HIGH (ref 70–99)
Potassium: 3.4 mmol/L — ABNORMAL LOW (ref 3.5–5.1)
Sodium: 135 mmol/L (ref 135–145)
Total Bilirubin: 0.8 mg/dL (ref 0.3–1.2)
Total Protein: 6.2 g/dL — ABNORMAL LOW (ref 6.5–8.1)

## 2019-12-20 LAB — SAMPLE TO BLOOD BANK

## 2019-12-20 MED ORDER — HEPARIN SOD (PORK) LOCK FLUSH 100 UNIT/ML IV SOLN
500.0000 [IU] | Freq: Once | INTRAVENOUS | Status: AC
  Administered 2019-12-20: 500 [IU] via INTRAVENOUS

## 2019-12-20 MED ORDER — SODIUM CHLORIDE 0.9% FLUSH
20.0000 mL | INTRAVENOUS | Status: DC | PRN
  Administered 2019-12-20: 20 mL via INTRAVENOUS

## 2019-12-20 NOTE — Progress Notes (Signed)
Patients port flushed without difficulty.  Good blood return noted with no bruising or swelling noted at site.  Transparent dressing applied.  Patient left accessed for chemotherapy treatment. 

## 2019-12-20 NOTE — Progress Notes (Signed)
0940 Labs reviewed with Dr. Chryl Heck and pt's chemo tx to be held today due to [place] 0.8 and WBC 1.5 and rescheduled for next week with repeat labs per MD 0945 Discussed this information and to call for increased temperature with the pt and his wife who verbalized understanding. VSS Pt discharged self ambulatory in satisfactory coondition accompanied by his wife

## 2019-12-25 ENCOUNTER — Telehealth: Payer: Self-pay | Admitting: *Deleted

## 2019-12-25 ENCOUNTER — Other Ambulatory Visit (HOSPITAL_COMMUNITY): Payer: Self-pay

## 2019-12-25 DIAGNOSIS — C4922 Malignant neoplasm of connective and soft tissue of left lower limb, including hip: Secondary | ICD-10-CM

## 2019-12-25 DIAGNOSIS — R918 Other nonspecific abnormal finding of lung field: Secondary | ICD-10-CM

## 2019-12-25 MED ORDER — ESCITALOPRAM OXALATE 10 MG PO TABS: 10.0000 mg | ORAL_TABLET | Freq: Every day | ORAL | 2 refills | Status: DC

## 2019-12-25 NOTE — Telephone Encounter (Signed)
CALLED PATIENT TO ASK ABOUT RESCHEDUING FU ON 01-01-20 DUE TO DR. KINARD BEING ON VACATION, RESCHEDULED FOR 01-22-20, PATIENT AGREED TO NEW DAY AND TIME

## 2019-12-27 ENCOUNTER — Inpatient Hospital Stay (HOSPITAL_COMMUNITY): Payer: 59

## 2019-12-27 ENCOUNTER — Other Ambulatory Visit: Payer: Self-pay

## 2019-12-27 ENCOUNTER — Encounter (HOSPITAL_COMMUNITY): Payer: Self-pay

## 2019-12-27 VITALS — BP 128/84 | HR 84 | Temp 97.4°F | Resp 18

## 2019-12-27 DIAGNOSIS — Z95828 Presence of other vascular implants and grafts: Secondary | ICD-10-CM

## 2019-12-27 DIAGNOSIS — Z5111 Encounter for antineoplastic chemotherapy: Secondary | ICD-10-CM | POA: Diagnosis not present

## 2019-12-27 DIAGNOSIS — C4922 Malignant neoplasm of connective and soft tissue of left lower limb, including hip: Secondary | ICD-10-CM

## 2019-12-27 LAB — CBC WITH DIFFERENTIAL/PLATELET
Abs Immature Granulocytes: 0 10*3/uL (ref 0.00–0.07)
Basophils Absolute: 0 10*3/uL (ref 0.0–0.1)
Basophils Relative: 0 %
Eosinophils Absolute: 0.6 10*3/uL — ABNORMAL HIGH (ref 0.0–0.5)
Eosinophils Relative: 17 %
HCT: 38.3 % — ABNORMAL LOW (ref 39.0–52.0)
Hemoglobin: 12.4 g/dL — ABNORMAL LOW (ref 13.0–17.0)
Immature Granulocytes: 0 %
Lymphocytes Relative: 7 %
Lymphs Abs: 0.3 10*3/uL — ABNORMAL LOW (ref 0.7–4.0)
MCH: 32.5 pg (ref 26.0–34.0)
MCHC: 32.4 g/dL (ref 30.0–36.0)
MCV: 100.3 fL — ABNORMAL HIGH (ref 80.0–100.0)
Monocytes Absolute: 0.4 10*3/uL (ref 0.1–1.0)
Monocytes Relative: 10 %
Neutro Abs: 2.4 10*3/uL (ref 1.7–7.7)
Neutrophils Relative %: 66 %
Platelets: 232 10*3/uL (ref 150–400)
RBC: 3.82 MIL/uL — ABNORMAL LOW (ref 4.22–5.81)
RDW: 19.2 % — ABNORMAL HIGH (ref 11.5–15.5)
WBC: 3.7 10*3/uL — ABNORMAL LOW (ref 4.0–10.5)
nRBC: 0 % (ref 0.0–0.2)

## 2019-12-27 LAB — COMPREHENSIVE METABOLIC PANEL
ALT: 30 U/L (ref 0–44)
AST: 40 U/L (ref 15–41)
Albumin: 3.4 g/dL — ABNORMAL LOW (ref 3.5–5.0)
Alkaline Phosphatase: 119 U/L (ref 38–126)
Anion gap: 9 (ref 5–15)
BUN: 15 mg/dL (ref 6–20)
CO2: 24 mmol/L (ref 22–32)
Calcium: 8.7 mg/dL — ABNORMAL LOW (ref 8.9–10.3)
Chloride: 104 mmol/L (ref 98–111)
Creatinine, Ser: 0.65 mg/dL (ref 0.61–1.24)
GFR, Estimated: >60 mL/min (ref >60)
Glucose, Bld: 120 mg/dL — ABNORMAL HIGH (ref 70–99)
Potassium: 3.5 mmol/L (ref 3.5–5.1)
Sodium: 137 mmol/L (ref 135–145)
Total Bilirubin: 1 mg/dL (ref 0.3–1.2)
Total Protein: 6.3 g/dL — ABNORMAL LOW (ref 6.5–8.1)

## 2019-12-27 MED ORDER — SODIUM CHLORIDE 0.9 % IV SOLN: Freq: Once | INTRAVENOUS | Status: AC

## 2019-12-27 MED ORDER — SODIUM CHLORIDE 0.9 % IV SOLN
720.0000 mg/m2 | Freq: Once | INTRAVENOUS | Status: AC
  Administered 2019-12-27: 11:00:00 1634 mg via INTRAVENOUS
  Filled 2019-12-27: qty 27.2

## 2019-12-27 MED ORDER — PEGFILGRASTIM 6 MG/0.6ML ~~LOC~~ PSKT
PREFILLED_SYRINGE | SUBCUTANEOUS | Status: AC
  Filled 2019-12-27: qty 0.6

## 2019-12-27 MED ORDER — HEPARIN SOD (PORK) LOCK FLUSH 100 UNIT/ML IV SOLN
500.0000 [IU] | Freq: Once | INTRAVENOUS | Status: AC | PRN
  Administered 2019-12-27: 14:00:00 500 [IU]

## 2019-12-27 MED ORDER — SODIUM CHLORIDE 0.9 % IV SOLN
10.0000 mg | Freq: Once | INTRAVENOUS | Status: AC
  Administered 2019-12-27: 10:00:00 10 mg via INTRAVENOUS
  Filled 2019-12-27: qty 10

## 2019-12-27 MED ORDER — PEGFILGRASTIM 6 MG/0.6ML ~~LOC~~ PSKT
6.0000 mg | PREFILLED_SYRINGE | Freq: Once | SUBCUTANEOUS | Status: AC
  Administered 2019-12-27: 14:00:00 6 mg via SUBCUTANEOUS

## 2019-12-27 MED ORDER — PALONOSETRON HCL INJECTION 0.25 MG/5ML
0.2500 mg | Freq: Once | INTRAVENOUS | Status: AC
  Administered 2019-12-27: 10:00:00 0.25 mg via INTRAVENOUS

## 2019-12-27 MED ORDER — SODIUM CHLORIDE 0.9 % IV SOLN
66.6667 mg/m2 | Freq: Once | INTRAVENOUS | Status: AC
  Administered 2019-12-27: 13:00:00 150 mg via INTRAVENOUS
  Filled 2019-12-27: qty 15

## 2019-12-27 MED ORDER — PALONOSETRON HCL INJECTION 0.25 MG/5ML
INTRAVENOUS | Status: AC
  Filled 2019-12-27: qty 5

## 2019-12-27 NOTE — Progress Notes (Signed)
Patients port flushed without difficulty.  Good blood return noted with no bruising or swelling noted at site.  Transparent dressing applied.  Patient left accessed for chemotherapy treatment. 

## 2019-12-27 NOTE — Progress Notes (Signed)
Patient is here today for Leonard [name].  He was held last week due to low ANC.  Labs are within parameters today and he is okay for treatment.  Patient tolerated chemotherapy today without incidence.  He was discharged ambulatory and in stable condition from clinic with his wife.  He will follow up in 2 weeks as scheduled.

## 2019-12-27 NOTE — Patient Instructions (Signed)
You received your chemotherapy today. Follow up as scheduled in 2 weeks.

## 2019-12-29 ENCOUNTER — Ambulatory Visit (HOSPITAL_COMMUNITY): Payer: 59

## 2019-12-29 NOTE — Progress Notes (Signed)
Nutrition Follow-up:  Patient with liposarcoma of left lower extremity.  Chemotherapy restarted.   Spoke with patient via phone.  Patient reports appetite has been better and eating more since off treatment.  "I thought I would have gained weight but I lost some more."  Still drinking 3-4 shakes per day.  "I ate really good at Thanksgiving."  Reports constipation and trying to manage with over the counter medications.  Reports had bowel movement this am but was hard to get out.      Medications: reviewed  Labs: reviewed  Anthropometrics:   Weight 213 lb 6.4 oz on 12/15 decreased from 226 lb on 11/11    NUTRITION DIAGNOSIS: Inadequate oral intake continues   INTERVENTION:  Patient to continue drinking 3-4 equate plus shakes daily. Discussed strategies to help with constipation. Encouraged patient to reach out to cancer center team if no relief. Patient verbalized understanding.    MONITORING, EVALUATION, GOAL: weight trends, intake   NEXT VISIT: Jan 28 phone call  Leonard [name] B. Zenia Resides, [place], [place] Registered Dietitian [phone] (mobile)

## 2020-01-01 ENCOUNTER — Ambulatory Visit: Payer: Self-pay | Admitting: Radiation Oncology

## 2020-01-02 ENCOUNTER — Other Ambulatory Visit (HOSPITAL_COMMUNITY): Payer: Self-pay

## 2020-01-02 DIAGNOSIS — C4922 Malignant neoplasm of connective and soft tissue of left lower limb, including hip: Secondary | ICD-10-CM

## 2020-01-02 DIAGNOSIS — C499 Malignant neoplasm of connective and soft tissue, unspecified: Secondary | ICD-10-CM

## 2020-01-02 DIAGNOSIS — Z95828 Presence of other vascular implants and grafts: Secondary | ICD-10-CM

## 2020-01-02 MED ORDER — HYDROCODONE-HOMATROPINE 5-1.5 MG/5ML PO SYRP: ORAL_SOLUTION | ORAL | 0 refills | Status: DC

## 2020-01-02 MED ORDER — PROCHLORPERAZINE MALEATE 10 MG PO TABS: ORAL_TABLET | ORAL | 0 refills | Status: AC

## 2020-01-02 NOTE — Telephone Encounter (Signed)
Didn't refill oxycodone. That's a sister drug of hydrocodone. We cant do both hydrocodone and oxycodone at the same time.  Thanks,

## 2020-01-03 ENCOUNTER — Other Ambulatory Visit (HOSPITAL_COMMUNITY): Payer: 59

## 2020-01-03 ENCOUNTER — Ambulatory Visit (HOSPITAL_COMMUNITY): Payer: 59

## 2020-01-03 ENCOUNTER — Other Ambulatory Visit (HOSPITAL_COMMUNITY): Payer: Self-pay

## 2020-01-03 MED ORDER — OXYCODONE HCL 10 MG PO TABS: 5.0000 mg | ORAL_TABLET | Freq: Three times a day (TID) | ORAL | 0 refills | Status: AC | PRN

## 2020-01-10 ENCOUNTER — Other Ambulatory Visit: Payer: Self-pay

## 2020-01-10 ENCOUNTER — Inpatient Hospital Stay (HOSPITAL_COMMUNITY): Payer: 59

## 2020-01-10 ENCOUNTER — Other Ambulatory Visit (HOSPITAL_COMMUNITY): Payer: 59

## 2020-01-10 ENCOUNTER — Inpatient Hospital Stay (HOSPITAL_BASED_OUTPATIENT_CLINIC_OR_DEPARTMENT_OTHER): Payer: 59 | Admitting: Hematology

## 2020-01-10 VITALS — BP 103/78 | HR 104 | Temp 96.8°F | Resp 18 | Wt 212.4 lb

## 2020-01-10 VITALS — BP 104/65 | HR 77 | Temp 96.9°F | Resp 18

## 2020-01-10 DIAGNOSIS — C4922 Malignant neoplasm of connective and soft tissue of left lower limb, including hip: Secondary | ICD-10-CM

## 2020-01-10 DIAGNOSIS — Z95828 Presence of other vascular implants and grafts: Secondary | ICD-10-CM

## 2020-01-10 DIAGNOSIS — C499 Malignant neoplasm of connective and soft tissue, unspecified: Secondary | ICD-10-CM

## 2020-01-10 DIAGNOSIS — D630 Anemia in neoplastic disease: Secondary | ICD-10-CM

## 2020-01-10 DIAGNOSIS — Z5111 Encounter for antineoplastic chemotherapy: Secondary | ICD-10-CM | POA: Diagnosis not present

## 2020-01-10 LAB — COMPREHENSIVE METABOLIC PANEL
ALT: 24 U/L (ref 0–44)
AST: 31 U/L (ref 15–41)
Albumin: 3.3 g/dL — ABNORMAL LOW (ref 3.5–5.0)
Alkaline Phosphatase: 124 U/L (ref 38–126)
Anion gap: 9 (ref 5–15)
BUN: 13 mg/dL (ref 6–20)
CO2: 24 mmol/L (ref 22–32)
Calcium: 8.6 mg/dL — ABNORMAL LOW (ref 8.9–10.3)
Chloride: 102 mmol/L (ref 98–111)
Creatinine, Ser: 0.68 mg/dL (ref 0.61–1.24)
GFR, Estimated: >60 mL/min (ref >60)
Glucose, Bld: 131 mg/dL — ABNORMAL HIGH (ref 70–99)
Potassium: 3.5 mmol/L (ref 3.5–5.1)
Sodium: 135 mmol/L (ref 135–145)
Total Bilirubin: 0.7 mg/dL (ref 0.3–1.2)
Total Protein: 6.3 g/dL — ABNORMAL LOW (ref 6.5–8.1)

## 2020-01-10 LAB — CBC WITH DIFFERENTIAL/PLATELET
Abs Immature Granulocytes: 0.07 10*3/uL (ref 0.00–0.07)
Basophils Absolute: 0 10*3/uL (ref 0.0–0.1)
Basophils Relative: 1 %
Eosinophils Absolute: 0 10*3/uL (ref 0.0–0.5)
Eosinophils Relative: 0 %
HCT: 37.1 % — ABNORMAL LOW (ref 39.0–52.0)
Hemoglobin: 12 g/dL — ABNORMAL LOW (ref 13.0–17.0)
Immature Granulocytes: 1 %
Lymphocytes Relative: 6 %
Lymphs Abs: 0.4 10*3/uL — ABNORMAL LOW (ref 0.7–4.0)
MCH: 32.4 pg (ref 26.0–34.0)
MCHC: 32.3 g/dL (ref 30.0–36.0)
MCV: 100.3 fL — ABNORMAL HIGH (ref 80.0–100.0)
Monocytes Absolute: 0.6 10*3/uL (ref 0.1–1.0)
Monocytes Relative: 10 %
Neutro Abs: 4.7 10*3/uL (ref 1.7–7.7)
Neutrophils Relative %: 82 %
Platelets: 314 10*3/uL (ref 150–400)
RBC: 3.7 MIL/uL — ABNORMAL LOW (ref 4.22–5.81)
RDW: 19.4 % — ABNORMAL HIGH (ref 11.5–15.5)
WBC: 5.7 10*3/uL (ref 4.0–10.5)
nRBC: 0 % (ref 0.0–0.2)

## 2020-01-10 LAB — SAMPLE TO BLOOD BANK

## 2020-01-10 MED ORDER — HEPARIN SOD (PORK) LOCK FLUSH 100 UNIT/ML IV SOLN
500.0000 [IU] | Freq: Once | INTRAVENOUS | Status: AC | PRN
  Administered 2020-01-10: 13:00:00 500 [IU]

## 2020-01-10 MED ORDER — HYDROCODONE-HOMATROPINE 5-1.5 MG/5ML PO SYRP: 10.0000 mL | ORAL_SOLUTION | Freq: Four times a day (QID) | ORAL | 0 refills | Status: DC | PRN

## 2020-01-10 MED ORDER — SODIUM CHLORIDE 0.9% FLUSH
10.0000 mL | INTRAVENOUS | Status: DC | PRN
  Administered 2020-01-10: 10:00:00 10 mL

## 2020-01-10 MED ORDER — SODIUM CHLORIDE 0.9 % IV SOLN
720.0000 mg/m2 | Freq: Once | INTRAVENOUS | Status: AC
  Administered 2020-01-10: 12:00:00 1634 mg via INTRAVENOUS
  Filled 2020-01-10: qty 27.2

## 2020-01-10 MED ORDER — SODIUM CHLORIDE 0.9 % IV SOLN: Freq: Once | INTRAVENOUS | Status: AC

## 2020-01-10 MED ORDER — SODIUM CHLORIDE 0.9 % IV SOLN
Freq: Once | INTRAVENOUS | Status: AC
  Administered 2020-01-10: 11:00:00 8 mg via INTRAVENOUS
  Filled 2020-01-10: qty 4

## 2020-01-10 NOTE — Progress Notes (Signed)
Labs reviewed and ok to treat today verbal order Dr. Delton Coombes.  Patient stated prn fever blisters on lips during the winter time.  Dr. Delton Coombes notified.  Patient instructed to use Blistex as needed per oncologist.   Patient tolerated chemotherapy with no complaints voiced.  Side effects with management reviewed with understanding verbalized.  Port site clean and dry with no bruising or swelling noted at site.  Good blood return noted before and after administration of chemotherapy.  Band aid applied.  Patient left in satisfactory condition with VSS and no s/s of distress noted.

## 2020-01-10 NOTE — Patient Instructions (Addendum)
[name] at [facility] Discharge Instructions  You were seen today by Dr. Delton Coombes. He went over your recent results. You received your treatment today; return in 1 week for your treatment. You will be scheduled for a CT scan of your chest and abdomen before your next visit. Dr. Delton Coombes will see you back in 3 weeks for labs and follow up.   Thank you for choosing [name] at [facility] to provide your oncology and hematology care.  To afford each patient quality time with our provider, please arrive at least 15 minutes before your scheduled appointment time.   If you have a lab appointment with the [facility] please come in thru the Main Entrance and check in at the main information desk  You need to re-schedule your appointment should you arrive 10 or more minutes late.  We strive to give you quality time with our providers, and arriving late affects you and other patients whose appointments are after yours.  Also, if you no show three or more times for appointments you may be dismissed from the clinic at the providers discretion.     Again, thank you for choosing [facility].  Our hope is that these requests will decrease the amount of time that you wait before being seen by our physicians.       _____________________________________________________________  Should you have questions after your visit to [facility], please contact our office at [phone] between the hours of 8:00 a.m. and 4:30 p.m.  Voicemails left after 4:00 p.m. will not be returned until the following business day.  For prescription refill requests, have your pharmacy contact our office and allow 72 hours.    Cancer Center Support Programs:   > Cancer Support Group  2nd Tuesday of the month 1pm-2pm, Journey Room

## 2020-01-10 NOTE — Progress Notes (Signed)
Leonard [name], Leonard [name]   CLINIC:  Medical Oncology/Hematology  PCP:  Leonard Squibb, MD [address] Leonard [name] [phone]   REASON FOR VISIT:  Follow-up for metastatic dedifferentiated liposarcoma  PRIOR THERAPY:  1. Gemcitabine & docetaxel followed by wide local excision on 11/10/2018. 2. SBRT to right lung 50 Gy in 10 fractions from 11/14/2019 to 11/29/2019.  NGS Results: Pending  CURRENT THERAPY: Gemcitabine on day 1 & 8 with docetaxel on day 8 every 4 weeks  BRIEF ONCOLOGIC HISTORY:  Oncology History  Liposarcoma of left lower extremity ([place])  04/25/2019 Initial Diagnosis   Liposarcoma of left lower extremity ([place])   05/22/2019 - 07/04/2019 Chemotherapy   The patient had DOXOrubicin (ADRIAMYCIN) chemo injection 174 mg, 75 mg/m2 = 174 mg, Intravenous,  Once, 3 of 6 cycles Administration: 174 mg (05/22/2019), 174 mg (06/13/2019), 174 mg (07/04/2019) palonosetron (ALOXI) injection 0.25 mg, 0.25 mg, Intravenous,  Once, 3 of 6 cycles Administration: 0.25 mg (05/22/2019), 0.25 mg (06/13/2019), 0.25 mg (07/04/2019) fosaprepitant (EMEND) 150 mg in sodium chloride 0.9 % 145 mL IVPB, 150 mg, Intravenous,  Once, 3 of 6 cycles Administration: 150 mg (05/22/2019), 150 mg (06/13/2019), 150 mg (07/04/2019)  for chemotherapy treatment.    08/11/2019 -  Chemotherapy   The patient had palonosetron (ALOXI) injection 0.25 mg, 0.25 mg, Intravenous,  Once, 6 of 8 cycles Administration: 0.25 mg (08/18/2019), 0.25 mg (09/07/2019), 0.25 mg (09/28/2019), 0.25 mg (10/19/2019), 0.25 mg (11/09/2019), 0.25 mg (12/27/2019) ondansetron (ZOFRAN) injection 8 mg, 8 mg (100 % of original dose 8 mg), Intravenous,  Once, 3 of 3 cycles Dose modification: 8 mg (original dose 8 mg, Cycle 1) Administration: 8 mg (08/11/2019), 8 mg (08/31/2019), 8 mg (09/21/2019) pegfilgrastim (NEULASTA) injection 6 mg, 6 mg, Subcutaneous, Once, 1 of 1 cycle Administration: 6 mg  (08/21/2019) pegfilgrastim (NEULASTA ONPRO KIT) injection 6 mg, 6 mg, Subcutaneous, Once, 6 of 8 cycles Administration: 6 mg (09/07/2019), 6 mg (09/28/2019), 6 mg (10/19/2019), 6 mg (11/09/2019), 6 mg (12/27/2019) ondansetron (ZOFRAN) 8 mg in sodium chloride 0.9 % 50 mL IVPB, , Intravenous,  Once, 3 of 5 cycles Administration: 8 mg (10/12/2019), 8 mg (11/02/2019), 8 mg (12/13/2019) DOCEtaxel (TAXOTERE) 130 mg in sodium chloride 0.9 % 250 mL chemo infusion, 56.25 mg/m2 = 170 mg (75 % of original dose 100 mg/m2), Intravenous,  Once, 6 of 8 cycles Dose modification: 75 mg/m2 (75 % of original dose 100 mg/m2, Cycle 1, Reason: Provider Judgment), 75 mg/m2 (75 % of original dose 100 mg/m2, Cycle 2, Reason: Provider Judgment), 75 mg/m2 (original dose 100 mg/m2, Cycle 3, Reason: Provider Judgment), 88.5027 mg/m2 (66.7 % of original dose 100 mg/m2, Cycle 3, Reason: Other (see comments), Comment: neutropenia and elevated LFT), 66.6667 mg/m2 (66.7 % of original dose 100 mg/m2, Cycle 4, Reason: Change in LFTs) Administration: 130 mg (08/18/2019), 170 mg (09/07/2019), 150 mg (09/28/2019), 150 mg (10/19/2019), 150 mg (11/09/2019), 150 mg (12/27/2019) gemcitabine (GEMZAR) 2,052 mg in sodium chloride 0.9 % 250 mL chemo infusion, 900 mg/m2 = 2,052 mg, Intravenous,  Once, 6 of 8 cycles Dose modification: 720 mg/m2 (80 % of original dose 900 mg/m2, Cycle 2, Reason: Provider Judgment) Administration: 2,052 mg (08/11/2019), 1,520 mg (08/18/2019), 1,634 mg (08/31/2019), 1,634 mg (09/07/2019), 1,634 mg (09/21/2019), 1,634 mg (09/28/2019), 1,634 mg (10/12/2019), 1,634 mg (10/19/2019), 1,634 mg (11/02/2019), 1,634 mg (11/09/2019), 1,634 mg (12/13/2019), 1,634 mg (12/27/2019)  for chemotherapy treatment.    Liposarcoma ([place])  05/15/2019 Initial Diagnosis   Liposarcoma ([place])   05/15/2019 Cancer Staging   Staging form: Primary Cutaneous B-Cell/T-Cell Lymphoma (Non-MF/SS Lymphoma), AJCC 8th Edition - Clinical stage from 05/15/2019: [name], pM1 -  Signed by [name], MD on 05/15/2019     CANCER STAGING: Cancer Staging Liposarcoma [facility]) Staging form: Primary Cutaneous B-Cell/T-Cell Lymphoma (Non-MF/SS Lymphoma), AJCC 8th Edition - Clinical stage from 05/15/2019: [name], pM1 - Signed by [name], MD on 05/15/2019  Liposarcoma of left lower extremity [facility]) Staging form: Primary Cutaneous B-Cell/T-Cell Lymphoma (Non-MF/SS Lymphoma), AJCC 8th Edition - Clinical: No stage assigned - Unsigned   INTERVAL HISTORY:  Leonard [name], a 59 y.o. male, returns for routine follow-up and consideration for next cycle of chemotherapy. Leonard [name] was last seen on 12/13/2019.  Due for cycle #7 of gemcitabine and docetaxel today.   Today he is accompanied by his wife. Overall, he tells me he has been feeling pretty well. He tolerated the previous treatments well. He denies having any worsening of his numbness or tingling, though he complains of losing balance at times and stumbling due to both bad knees; he has not fallen recently while walking, though he has tipped over several times when bending over. He still has sensation in his feet. He also gets dizzy if he stands up too quickly. His cough seems to be getting worse and he is constantly clearing his throat; he continues taking Hycodan daily and if he gets a coughing spell. He denies having pain although he reports having pain in his lateral lower ribs if he gets a coughing spell. He is drinking 3-4 cans of Boost per day. His wife reports that he is choking more often when he eats  He has not received his COVID booster but is contemplating about getting it.  Overall, he feels ready for next cycle of chemo today.    REVIEW OF SYSTEMS:  Review of Systems  Constitutional: Positive for appetite change (50%) and fatigue (50%).  Respiratory: Positive for shortness of breath.   Cardiovascular: Positive for leg swelling.  Gastrointestinal: Positive for constipation.  Musculoskeletal:  Positive for gait problem (stumbling & occasionally losing balance).  Neurological: Positive for gait problem (stumbling & occasionally losing balance) and headaches. Negative for numbness.  All other systems reviewed and are negative.   PAST MEDICAL/SURGICAL HISTORY:  Past Medical History:  Diagnosis Date  . Cancer ([place])   . Port-A-Cath in place 05/19/2019   right   Past Surgical History:  Procedure Laterality Date  . CHOLECYSTECTOMY    . ESOPHAGEAL DILATION N/A 06/27/2017   Procedure: ESOPHAGEAL DILATION;  Surgeon: Danie Binder, MD;  Location: [facility];  Service: Endoscopy;  Laterality: N/A;  . ESOPHAGOGASTRODUODENOSCOPY N/A 06/27/2017   Procedure: ESOPHAGOGASTRODUODENOSCOPY (EGD);  Surgeon: Danie Binder, MD;  Location: [facility];  Service: Endoscopy;  Laterality: N/A;  . IR IMAGING GUIDED PORT INSERTION  05/04/2019  . knee sx      SOCIAL HISTORY:  Social History   Socioeconomic History  . Marital status: Married    Spouse name: Not on file  . Number of children: 1  . Years of education: Not on file  . Highest education level: Not on file  Occupational History  . Occupation: Unemployed  Tobacco Use  . Smoking status: Former Research scientist (life sciences)  . Smokeless tobacco: Never Used  Vaping Use  . Vaping Use: Never used  Substance and Sexual Activity  . Alcohol use: Yes    Comment: 1-2 drinks per  month  . Drug use: Never  . Sexual activity: Not Currently  Other Topics Concern  . Not on file  Social History Narrative   DOES CONSTRUCTION. MARRIED. RARE ETOH. NO TOBACCO.   Social Determinants of Health   Financial Resource Strain: Medium Risk  . Difficulty of Paying Living Expenses: Somewhat hard  Food Insecurity: No Food Insecurity  . Worried About Charity fundraiser in the Last Year: Never true  . Ran Out of Food in the Last Year: Never true  Transportation Needs: No Transportation Needs  . Lack of Transportation (Medical): No  . Lack of Transportation  (Non-Medical): No  Physical Activity: Inactive  . Days of Exercise per Week: 0 days  . Minutes of Exercise per Session: 0 min  Stress: Stress Concern Present  . Feeling of Stress : To some extent  Social Connections: Moderately Integrated  . Frequency of Communication with Friends and Family: More than three times a week  . Frequency of Social Gatherings with Friends and Family: Once a week  . Attends Religious Services: More than 4 times per year  . Active Member of Clubs or Organizations: No  . Attends Archivist Meetings: Never  . Marital Status: Married  Human resources officer Violence: Not At Risk  . Fear of Current or Ex-Partner: No  . Emotionally Abused: No  . Physically Abused: No  . Sexually Abused: No    FAMILY HISTORY:  Family History  Problem Relation Age of Onset  . Heart disease Father   . Cancer Maternal Aunt   . Cancer Maternal Uncle   . Cancer Paternal Uncle   . Vision loss Maternal Grandfather   . Colon cancer Neg Hx   . Colon polyps Neg Hx     CURRENT MEDICATIONS:  Current Outpatient Medications  Medication Sig Dispense Refill  . aluminum-magnesium hydroxide-simethicone (MAALOX) [phone] MG/5ML SUSP Swish and swallow 5 ml four times daily as needed for mouth sores. 600 mL 2  . DOCEtaxel (TAXOTERE IV) Inject into the vein.    Marland Kitchen escitalopram (LEXAPRO) 10 MG tablet Take 1 tablet (10 mg total) by mouth daily. 30 tablet 2  . Gemcitabine HCl (GEMZAR IV) Inject into the vein.    Marland Kitchen lidocaine (XYLOCAINE) 2 % solution SMARTSIG:1 Teaspoon By Mouth 4 Times Daily PRN    . ALPRAZolam (XANAX) 0.25 MG tablet Take 1 tablet (0.25 mg total) by mouth 2 (two) times daily as needed for anxiety. (Patient not taking: Reported on 01/10/2020) 60 tablet 3  . HYDROcodone-homatropine (HYCODAN) 5-1.5 MG/5ML syrup SMARTSIG:1 Teaspoon By Mouth Every 6 Hours PRN (Patient not taking: Reported on 01/10/2020) 480 mL 0  . Oxycodone HCl 10 MG TABS Take 0.5 tablets (5 mg total) by mouth  every 8 (eight) hours as needed. (Patient not taking: Reported on 01/10/2020) 30 tablet 0  . prochlorperazine (COMPAZINE) 10 MG tablet TAKE (1) TABLET BY MOUTH EVERY (6) HOURS AS NEEDED. (Patient not taking: Reported on 01/10/2020) 30 tablet 0   No current facility-administered medications for this visit.   Facility-Administered Medications Ordered in Other Visits  Medication Dose Route Frequency Provider Last Rate Last Admin  . sodium chloride flush (NS) 0.9 % injection 10 mL  10 mL Intravenous PRN [name], MD   10 mL at 08/21/19 1508    ALLERGIES:  No Known Allergies  PHYSICAL EXAM:  Performance status (ECOG): 1 - Symptomatic but completely ambulatory  Vitals:   01/10/20 0855  BP: 103/78  Pulse: (!) 104  Resp: 18  Temp: (!) 96.8 F (36 C)  SpO2: 95%   Wt Readings from Last 3 Encounters:  01/10/20 212 lb 6.4 oz (96.3 kg)  12/27/19 213 lb 6.4 oz (96.8 kg)  12/20/19 214 lb 6.4 oz (97.3 kg)   Physical Exam Chest:     Comments: Port-a-Cath in R chest    LABORATORY DATA:  I have reviewed the labs as listed.  CBC Latest Ref Rng & Units 01/10/2020 12/27/2019 12/20/2019  WBC 4.0 - 10.5 K/uL 5.7 3.7(L) 1.5(L)  Hemoglobin 13.0 - 17.0 g/dL 12.0(L) 12.4(L) 12.0(L)  Hematocrit 39.0 - 52.0 % 37.1(L) 38.3(L) 36.9(L)  Platelets 150 - 400 K/uL 314 232 101(L)   CMP Latest Ref Rng & Units 01/10/2020 12/27/2019 12/20/2019  Glucose 70 - 99 mg/dL 131(H) 120(H) 123(H)  BUN 6 - 20 mg/dL _0 Creatinine 0.61 - 1.24 mg/dL 0.68 0.65 0.65  Sodium 135 - 145 mmol/L 135 137 135  Potassium 3.5 - 5.1 mmol/L 3.5 3.5 3.4(L)  Chloride 98 - 111 mmol/L 102 104 103  CO2 22 - 32 mmol/L _1 Calcium 8.9 - 10.3 mg/dL 8.6(L) 8.7(L) 8.6(L)  Total Protein 6.5 - 8.1 g/dL 6.3(L) 6.3(L) 6.2(L)  Total Bilirubin 0.3 - 1.2 mg/dL 0.7 1.0 0.8  Alkaline Phos 38 - 126 U/L 124 119 127(H)  AST 15 - 41 U/L 31 40 54(H)  ALT 0 - 44 U/L 24 30 48(H)    DIAGNOSTIC IMAGING:  I have independently  reviewed the scans and discussed with the patient. No results found.   ASSESSMENT:  1. 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 -Chemoradiation therapy with gemcitabine and docetaxel followed by wide local excision. -Pathology on 11/10/2018 showed liposarcoma with 80% necrosis, grade 2, ypT4, NX, positive deep margin, amplification of MDM 2 detected. Amplification is associated with well-differentiated liposarcoma/atypical lipomatous tumors and dedifferentiated liposarcoma. -CT CAP in the ER on 04/24/2019 showed widespread pulmonary metastatic lesions throughout both lungs, largest left upper lobe mass measuring 4.7 x 4.2 cm. Mass in the inferior lingula measures 4.12 x 3.2 cm. -Bone scan on 05/02/2019 did not show any evidence of metastatic disease. -2D echo on 05/02/2019 shows EF 60 to 65%. -Adriamycin every 3 weeks started on 05/22/2019. -CT CAP on 07/19/2019 showed dominant lung lesions have decreased in size. 2 nodules in the right major fissure have progressed while some other pulmonary nodules are unchanged. No evidence of metastatic disease in the abdomen or pelvis. -Gemcitabine and docetaxel every 21 days started on 08/11/2019. -CT CAP on 11/01/2019 showed interval improvement in the pulmonary metastatic disease but new 5 mm nodule in the right upper lobe. Right lower lobe lesion has increased in volume. Lytic focus in the left posterior fourth rib is slightly prominent. Hepatic steatosis. -Right lung IMRT, 5 Gy, 10 fractions.  2. Left kidney lesion: -CT on 04/24/2019 showed mass arising in the periphery of the upper lobe of the left kidney measuring 1.3 x 1.4 cm. -CT scan from [facility] in August 2020 was showing left kidney lesion. -Current scan on 07/19/2019 showed 15 mm hypoattenuating lesion in the upper pole left kidney stable since  prior.   PLAN:  1. Metastatic dedifferentiated liposarcoma: -He has tolerated last treatment very well. -Reviewed his labs today which showed normal LFTs and CBC. -We will proceed with cycle 7 of gemcitabine and docetaxel with dose reductions. -We will see him back in 3 weeks  with repeat CT chest, abdomen and pelvis.  2. Dry cough: -Wife reports that his cough has slightly increased. -We will increase hydrocodone to 2 teaspoons every 6 hours as needed.  3. Anxiety: -Continue Xanax at bedtime as needed.  4. Right-sided chest wall pain: -He has pain only when he coughs.  5. Weight loss: -Continue boost 2 to 3 cans/day along with meals.  6. Depression: -Continue Lexapro daily.  7. Mucositis: -Continue Maalox with lidocaine as needed.   Orders placed this encounter:  No orders of the defined types were placed in this encounter.    [name], MD [place] [phone]   I, Milinda Antis, am acting as a scribe for Dr. Sanda Linger.  I, [name] MD, have reviewed the above documentation for accuracy and completeness, and I agree with the above.

## 2020-01-10 NOTE — Progress Notes (Signed)
Patients port flushed without difficulty.  Good blood return noted with no bruising or swelling noted at site.  Transparent dressing applied.  Patient left accessed for chemotherapy treatment. 

## 2020-01-11 ENCOUNTER — Other Ambulatory Visit (HOSPITAL_COMMUNITY): Payer: Self-pay

## 2020-01-15 ENCOUNTER — Encounter (HOSPITAL_COMMUNITY): Payer: Self-pay

## 2020-01-15 NOTE — Progress Notes (Signed)
Call received from patient's wife. Leonard [name] reporting that the patient has coughed up some blood. Patient reports that it is "not a whole lot." Dr. Delton Coombes aware. Patient to be seen 01/17/2020.

## 2020-01-17 ENCOUNTER — Inpatient Hospital Stay (HOSPITAL_COMMUNITY): Payer: 59 | Attending: Oncology

## 2020-01-17 ENCOUNTER — Inpatient Hospital Stay (HOSPITAL_COMMUNITY): Payer: 59

## 2020-01-17 ENCOUNTER — Encounter (HOSPITAL_COMMUNITY): Payer: Self-pay

## 2020-01-17 ENCOUNTER — Other Ambulatory Visit (HOSPITAL_COMMUNITY): Payer: Self-pay

## 2020-01-17 ENCOUNTER — Other Ambulatory Visit: Payer: Self-pay

## 2020-01-17 DIAGNOSIS — C7800 Secondary malignant neoplasm of unspecified lung: Secondary | ICD-10-CM | POA: Insufficient documentation

## 2020-01-17 DIAGNOSIS — Z79899 Other long term (current) drug therapy: Secondary | ICD-10-CM | POA: Diagnosis not present

## 2020-01-17 DIAGNOSIS — Z95828 Presence of other vascular implants and grafts: Secondary | ICD-10-CM

## 2020-01-17 DIAGNOSIS — R634 Abnormal weight loss: Secondary | ICD-10-CM | POA: Insufficient documentation

## 2020-01-17 DIAGNOSIS — C4922 Malignant neoplasm of connective and soft tissue of left lower limb, including hip: Secondary | ICD-10-CM | POA: Diagnosis not present

## 2020-01-17 DIAGNOSIS — R269 Unspecified abnormalities of gait and mobility: Secondary | ICD-10-CM | POA: Insufficient documentation

## 2020-01-17 DIAGNOSIS — R059 Cough, unspecified: Secondary | ICD-10-CM | POA: Insufficient documentation

## 2020-01-17 DIAGNOSIS — K760 Fatty (change of) liver, not elsewhere classified: Secondary | ICD-10-CM | POA: Insufficient documentation

## 2020-01-17 DIAGNOSIS — C499 Malignant neoplasm of connective and soft tissue, unspecified: Secondary | ICD-10-CM

## 2020-01-17 DIAGNOSIS — F418 Other specified anxiety disorders: Secondary | ICD-10-CM | POA: Diagnosis not present

## 2020-01-17 DIAGNOSIS — R5383 Other fatigue: Secondary | ICD-10-CM | POA: Insufficient documentation

## 2020-01-17 DIAGNOSIS — G479 Sleep disorder, unspecified: Secondary | ICD-10-CM | POA: Diagnosis not present

## 2020-01-17 DIAGNOSIS — R062 Wheezing: Secondary | ICD-10-CM | POA: Insufficient documentation

## 2020-01-17 DIAGNOSIS — Z87891 Personal history of nicotine dependence: Secondary | ICD-10-CM | POA: Insufficient documentation

## 2020-01-17 DIAGNOSIS — Z5111 Encounter for antineoplastic chemotherapy: Secondary | ICD-10-CM | POA: Diagnosis not present

## 2020-01-17 DIAGNOSIS — J948 Other specified pleural conditions: Secondary | ICD-10-CM | POA: Diagnosis not present

## 2020-01-17 LAB — CBC WITH DIFFERENTIAL/PLATELET
Abs Immature Granulocytes: 0.01 10*3/uL (ref 0.00–0.07)
Basophils Absolute: 0 10*3/uL (ref 0.0–0.1)
Basophils Relative: 2 %
Eosinophils Absolute: 0 10*3/uL (ref 0.0–0.5)
Eosinophils Relative: 0 %
HCT: 35.8 % — ABNORMAL LOW (ref 39.0–52.0)
Hemoglobin: 11.7 g/dL — ABNORMAL LOW (ref 13.0–17.0)
Immature Granulocytes: 1 %
Lymphocytes Relative: 14 %
Lymphs Abs: 0.3 10*3/uL — ABNORMAL LOW (ref 0.7–4.0)
MCH: 32.5 pg (ref 26.0–34.0)
MCHC: 32.7 g/dL (ref 30.0–36.0)
MCV: 99.4 fL (ref 80.0–100.0)
Monocytes Absolute: 0.4 10*3/uL (ref 0.1–1.0)
Monocytes Relative: 21 %
Neutro Abs: 1.1 10*3/uL — ABNORMAL LOW (ref 1.7–7.7)
Neutrophils Relative %: 62 %
Platelets: 261 10*3/uL (ref 150–400)
RBC: 3.6 MIL/uL — ABNORMAL LOW (ref 4.22–5.81)
RDW: 18.1 % — ABNORMAL HIGH (ref 11.5–15.5)
WBC: 1.8 10*3/uL — ABNORMAL LOW (ref 4.0–10.5)
nRBC: 0 % (ref 0.0–0.2)

## 2020-01-17 LAB — COMPREHENSIVE METABOLIC PANEL
ALT: 43 U/L (ref 0–44)
AST: 55 U/L — ABNORMAL HIGH (ref 15–41)
Albumin: 3.3 g/dL — ABNORMAL LOW (ref 3.5–5.0)
Alkaline Phosphatase: 122 U/L (ref 38–126)
Anion gap: 11 (ref 5–15)
BUN: 13 mg/dL (ref 6–20)
CO2: 24 mmol/L (ref 22–32)
Calcium: 8.8 mg/dL — ABNORMAL LOW (ref 8.9–10.3)
Chloride: 104 mmol/L (ref 98–111)
Creatinine, Ser: 0.69 mg/dL (ref 0.61–1.24)
GFR, Estimated: >60 mL/min (ref >60)
Glucose, Bld: 134 mg/dL — ABNORMAL HIGH (ref 70–99)
Potassium: 3.4 mmol/L — ABNORMAL LOW (ref 3.5–5.1)
Sodium: 139 mmol/L (ref 135–145)
Total Bilirubin: 0.7 mg/dL (ref 0.3–1.2)
Total Protein: 6.3 g/dL — ABNORMAL LOW (ref 6.5–8.1)

## 2020-01-17 LAB — SAMPLE TO BLOOD BANK

## 2020-01-17 MED ORDER — HYDROCODONE-HOMATROPINE 5-1.5 MG/5ML PO SYRP: 10.0000 mL | ORAL_SOLUTION | Freq: Four times a day (QID) | ORAL | 0 refills | Status: DC | PRN

## 2020-01-17 MED ORDER — HEPARIN SOD (PORK) LOCK FLUSH 100 UNIT/ML IV SOLN
500.0000 [IU] | Freq: Once | INTRAVENOUS | Status: AC
  Administered 2020-01-17: 500 [IU] via INTRAVENOUS

## 2020-01-17 MED ORDER — SODIUM CHLORIDE 0.9% FLUSH
20.0000 mL | INTRAVENOUS | Status: DC | PRN
  Administered 2020-01-17: 20 mL via INTRAVENOUS

## 2020-01-17 NOTE — Progress Notes (Signed)
53 Labs and pt's condition and issues reviewed with Dr. Delton Coombes and pt's chemo tx to be held today and rescheduled for next week with scans and next MD appt the same per MD.Pt instructed to call for any fever,signs of infection or any other issues with understanding verbalized VSS Pt discharged via wheelchair in stable condition accompanied by his wife

## 2020-01-17 NOTE — Patient Instructions (Signed)
[facility] at [facility] Discharge Instructions  Labs drawn from portacath today   Thank you for choosing [facility] at [facility] to provide your oncology and hematology care.  To afford each patient quality time with our provider, please arrive at least 15 minutes before your scheduled appointment time.   If you have a lab appointment with the Cancer Center please come in thru the Main Entrance and check in at the main information desk.  You need to re-schedule your appointment should you arrive 10 or more minutes late.  We strive to give you quality time with our providers, and arriving late affects you and other patients whose appointments are after yours.  Also, if you no show three or more times for appointments you may be dismissed from the clinic at the providers discretion.     Again, thank you for choosing [facility].  Our hope is that these requests will decrease the amount of time that you wait before being seen by our physicians.       _____________________________________________________________  Should you have questions after your visit to [facility], please contact our office at [phone] and follow the prompts.  Our office hours are 8:00 a.m. and 4:30 p.m. Monday - Friday.  Please note that voicemails left after 4:00 p.m. may not be returned until the following business day.  We are closed weekends and major holidays.  You do have access to a nurse 24-7, just call the main number to the clinic [phone] and do not press any options, hold on the line and a nurse will answer the phone.    For prescription refill requests, have your pharmacy contact our office and allow 72 hours.    Due to Covid, you will need to wear a mask upon entering the hospital. If you do not have a mask, a mask will be given to you at the Main Entrance upon arrival. For doctor visits, patients may have 1 support person age 18  or older with them. For treatment visits, patients can not have anyone with them due to social distancing guidelines and our immunocompromised population.     

## 2020-01-22 ENCOUNTER — Other Ambulatory Visit: Payer: Self-pay

## 2020-01-22 ENCOUNTER — Encounter: Payer: Self-pay | Admitting: Radiation Oncology

## 2020-01-22 ENCOUNTER — Ambulatory Visit
Admission: RE | Admit: 2020-01-22 | Discharge: 2020-01-22 | Disposition: A | Discharge status: Home / Self Care | Payer: 59 | Source: Ambulatory Visit | Attending: Radiation Oncology | Admitting: Radiation Oncology

## 2020-01-22 VITALS — BP 113/78 | HR 86 | Temp 98.4°F | Resp 20 | Ht 70.0 in | Wt 209.0 lb

## 2020-01-22 DIAGNOSIS — C4922 Malignant neoplasm of connective and soft tissue of left lower limb, including hip: Secondary | ICD-10-CM | POA: Diagnosis not present

## 2020-01-22 DIAGNOSIS — C499 Malignant neoplasm of connective and soft tissue, unspecified: Secondary | ICD-10-CM

## 2020-01-22 DIAGNOSIS — C7801 Secondary malignant neoplasm of right lung: Secondary | ICD-10-CM | POA: Insufficient documentation

## 2020-01-22 DIAGNOSIS — R059 Cough, unspecified: Secondary | ICD-10-CM | POA: Diagnosis not present

## 2020-01-22 DIAGNOSIS — Z79899 Other long term (current) drug therapy: Secondary | ICD-10-CM | POA: Insufficient documentation

## 2020-01-22 DIAGNOSIS — R5383 Other fatigue: Secondary | ICD-10-CM | POA: Insufficient documentation

## 2020-01-22 NOTE — Progress Notes (Signed)
Radiation Oncology         [phone] ________________________________  Name: Leonard [name] MRN: [id number]  Date: 01/22/2020  DOB: 1960/11/02  Follow-Up Visit Note  CC: Celene Squibb, MD  [name], MD    ICD-10-CM   1. Liposarcoma of left lower extremity ([facility])  C49.22   2. Liposarcoma ([facility])  C49.9     Diagnosis: Clinical stage cT1a, cNX, pM1 metastatic dedifferentiated liposarcoma  Interval Since Last Radiation: One month, three weeks, and three days  Radiation Treatment Dates: 11/14/2019 through 11/29/2019 Site Technique Total Dose (Gy) Dose per Fx (Gy) Completed Fx Beam Energies  Lung, Right: Lung_Rt IMRT 50/50 5 10/10 6XFFF    Narrative:  The patient returns today for routine follow-up. He was last seen by Dr. Delton Coombes on 01/10/2020, during which time he was noted to be tolerating treatment well.   On review of systems, he reports occasional cough which is controlled well with medication. He denies pain within the chest area.  He denies any significant hemoptysis.  Patient's breathing is stable.  He does have some fatigue.  Patient's recent chemotherapy was held in light of his fatigue..                           ALLERGIES:  has No Known Allergies.  Meds: Current Outpatient Medications  Medication Sig Dispense Refill  . ALPRAZolam (XANAX) 0.25 MG tablet Take 1 tablet (0.25 mg total) by mouth 2 (two) times daily as needed for anxiety. 60 tablet 3  . DOCEtaxel (TAXOTERE IV) Inject into the vein.    Marland Kitchen escitalopram (LEXAPRO) 10 MG tablet Take 1 tablet (10 mg total) by mouth daily. 30 tablet 2  . Gemcitabine HCl (GEMZAR IV) Inject into the vein.    Marland Kitchen HYDROcodone-homatropine (HYCODAN) 5-1.5 MG/5ML syrup Take 10 mLs by mouth every 6 (six) hours as needed for cough. SMARTSIG:1 Teaspoon By Mouth Every 6 Hours PRN 480 mL 0  . Oxycodone HCl 10 MG TABS Take 0.5 tablets (5 mg total) by mouth every 8 (eight) hours as needed. 30 tablet 0  . prochlorperazine (COMPAZINE) 10  MG tablet TAKE (1) TABLET BY MOUTH EVERY (6) HOURS AS NEEDED. 30 tablet 0  . aluminum-magnesium hydroxide-simethicone (MAALOX) 829-562-13 MG/5ML SUSP Swish and swallow 5 ml four times daily as needed for mouth sores. (Patient not taking: Reported on 01/22/2020) 600 mL 2  . lidocaine (XYLOCAINE) 2 % solution SMARTSIG:1 Teaspoon By Mouth 4 Times Daily PRN (Patient not taking: Reported on 01/22/2020)     No current facility-administered medications for this encounter.   Facility-Administered Medications Ordered in Other Encounters  Medication Dose Route Frequency Provider Last Rate Last Admin  . sodium chloride flush (NS) 0.9 % injection 10 mL  10 mL Intravenous PRN [name], MD   10 mL at 08/21/19 1508    Physical Findings: The patient is in no acute distress. Patient is alert and oriented.  height is 5\' 10"  (1.778 m) and weight is 209 lb (94.8 kg). His temperature is 98.4 F (36.9 C). His blood pressure is 113/78 and his pulse is 86. His respiration is 20 and oxygen saturation is 95%. No significant changes. Lungs are clear to auscultation bilaterally. Heart has regular rate and rhythm. No palpable cervical, supraclavicular, or axillary adenopathy. Abdomen soft, non-tender, normal bowel sounds.  Lab Findings: Lab Results  Component Value Date   WBC 1.8 (L) 01/17/2020   HGB 11.7 (L) 01/17/2020   HCT  35.8 (L) 01/17/2020   MCV 99.4 01/17/2020   PLT 261 01/17/2020    Radiographic Findings: No results found.  Impression: Clinical stage cT1a, cNX, pM1 metastatic dedifferentiated liposarcoma  The patient is recovering from the effects of radiation.  He continues to have fatigue which is likely multifactorial  Plan: The patient is scheduled to undergo a CT scan of chest/abdomen/pelvis on 01/29/2020. He will then follow up with Dr. Delton Coombes on 01/31/2019. He will follow up with radiation oncology in as needed basis in light of his close follow-up with medical oncology  months.   ____________________________________   Blair Promise, PhD, MD  This document serves as a record of services personally performed by Gery Pray, MD. It was created on his behalf by Clerance Lav, a trained medical scribe. The creation of this record is based on the scribe's personal observations and the provider's statements to them. This document has been checked and approved by the attending provider.

## 2020-01-22 NOTE — Progress Notes (Addendum)
Patient is here today for radiation follow up to left lower extremity completed November 2021.  Denies having any pain.  Patient denies having any skin changes.  Reports limitations in movement (example: balance can be off/difficult to stand for prolonged period of time/getting up from seating position is challenging).  Reports a great deal of fatigue.  Vitals:   01/22/20 1545  BP: 113/78  Pulse: 86  Resp: 20  Temp: 98.4 F (36.9 C)  SpO2: 95%  Weight: 209 lb (94.8 kg)  Height: 5\' 10"  (1.778 m)

## 2020-01-22 NOTE — Progress Notes (Incomplete)
  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 He did report nausea, ongoing shortness of breath, severe fatigue, poor appetite, decreased taste, and large amounts of blood when blowing nose. The oral cavity was moist without secondary infection.  Plan: The patient will follow-up with radiation oncology in one month.  ________________________________________________   Blair Promise, PhD, MD  This document serves as a record of services personally performed by Gery Pray, MD. It was created on his behalf by Clerance Lav, a trained medical scribe. The creation of this record is based on the scribe's personal observations and the provider's statements to them. This document has been checked and approved by the attending provider.

## 2020-01-24 ENCOUNTER — Inpatient Hospital Stay (HOSPITAL_COMMUNITY): Payer: 59

## 2020-01-24 ENCOUNTER — Other Ambulatory Visit: Payer: Self-pay

## 2020-01-24 VITALS — BP 131/85 | HR 84 | Temp 96.8°F | Resp 18

## 2020-01-24 DIAGNOSIS — Z95828 Presence of other vascular implants and grafts: Secondary | ICD-10-CM

## 2020-01-24 DIAGNOSIS — C4922 Malignant neoplasm of connective and soft tissue of left lower limb, including hip: Secondary | ICD-10-CM

## 2020-01-24 DIAGNOSIS — Z5111 Encounter for antineoplastic chemotherapy: Secondary | ICD-10-CM | POA: Diagnosis not present

## 2020-01-24 LAB — COMPREHENSIVE METABOLIC PANEL
ALT: 29 U/L (ref 0–44)
AST: 36 U/L (ref 15–41)
Albumin: 3.4 g/dL — ABNORMAL LOW (ref 3.5–5.0)
Alkaline Phosphatase: 114 U/L (ref 38–126)
Anion gap: 9 (ref 5–15)
BUN: 11 mg/dL (ref 6–20)
CO2: 25 mmol/L (ref 22–32)
Calcium: 8.6 mg/dL — ABNORMAL LOW (ref 8.9–10.3)
Chloride: 103 mmol/L (ref 98–111)
Creatinine, Ser: 0.78 mg/dL (ref 0.61–1.24)
GFR, Estimated: >60 mL/min (ref >60)
Glucose, Bld: 121 mg/dL — ABNORMAL HIGH (ref 70–99)
Potassium: 3.4 mmol/L — ABNORMAL LOW (ref 3.5–5.1)
Sodium: 137 mmol/L (ref 135–145)
Total Bilirubin: 0.7 mg/dL (ref 0.3–1.2)
Total Protein: 6.5 g/dL (ref 6.5–8.1)

## 2020-01-24 LAB — CBC WITH DIFFERENTIAL/PLATELET
Abs Immature Granulocytes: 0.01 10*3/uL (ref 0.00–0.07)
Basophils Absolute: 0 10*3/uL (ref 0.0–0.1)
Basophils Relative: 1 %
Eosinophils Absolute: 0.4 10*3/uL (ref 0.0–0.5)
Eosinophils Relative: 8 %
HCT: 39.5 % (ref 39.0–52.0)
Hemoglobin: 12.6 g/dL — ABNORMAL LOW (ref 13.0–17.0)
Immature Granulocytes: 0 %
Lymphocytes Relative: 10 %
Lymphs Abs: 0.4 10*3/uL — ABNORMAL LOW (ref 0.7–4.0)
MCH: 32.2 pg (ref 26.0–34.0)
MCHC: 31.9 g/dL (ref 30.0–36.0)
MCV: 101 fL — ABNORMAL HIGH (ref 80.0–100.0)
Monocytes Absolute: 0.7 10*3/uL (ref 0.1–1.0)
Monocytes Relative: 15 %
Neutro Abs: 3 10*3/uL (ref 1.7–7.7)
Neutrophils Relative %: 66 %
Platelets: 323 10*3/uL (ref 150–400)
RBC: 3.91 MIL/uL — ABNORMAL LOW (ref 4.22–5.81)
RDW: 19.1 % — ABNORMAL HIGH (ref 11.5–15.5)
WBC: 4.6 10*3/uL (ref 4.0–10.5)
nRBC: 0 % (ref 0.0–0.2)

## 2020-01-24 LAB — SAMPLE TO BLOOD BANK

## 2020-01-24 MED ORDER — SODIUM CHLORIDE 0.9 % IV SOLN
10.0000 mg | Freq: Once | INTRAVENOUS | Status: AC
  Administered 2020-01-24: 10 mg via INTRAVENOUS
  Filled 2020-01-24: qty 10

## 2020-01-24 MED ORDER — SODIUM CHLORIDE 0.9 % IV SOLN
720.0000 mg/m2 | Freq: Once | INTRAVENOUS | Status: AC
  Administered 2020-01-24: 1634 mg via INTRAVENOUS
  Filled 2020-01-24: qty 16.68

## 2020-01-24 MED ORDER — PEGFILGRASTIM 6 MG/0.6ML ~~LOC~~ PSKT
6.0000 mg | PREFILLED_SYRINGE | Freq: Once | SUBCUTANEOUS | Status: AC
  Administered 2020-01-24: 6 mg via SUBCUTANEOUS

## 2020-01-24 MED ORDER — SODIUM CHLORIDE 0.9% FLUSH
10.0000 mL | INTRAVENOUS | Status: DC | PRN
  Administered 2020-01-24: 10 mL

## 2020-01-24 MED ORDER — SODIUM CHLORIDE 0.9 % IV SOLN: Freq: Once | INTRAVENOUS | Status: AC

## 2020-01-24 MED ORDER — PALONOSETRON HCL INJECTION 0.25 MG/5ML
0.2500 mg | Freq: Once | INTRAVENOUS | Status: AC
  Administered 2020-01-24: 0.25 mg via INTRAVENOUS
  Filled 2020-01-24: qty 5

## 2020-01-24 MED ORDER — SODIUM CHLORIDE 0.9 % IV SOLN
66.6667 mg/m2 | Freq: Once | INTRAVENOUS | Status: AC
  Administered 2020-01-24: 150 mg via INTRAVENOUS
  Filled 2020-01-24: qty 15

## 2020-01-24 MED ORDER — SODIUM CHLORIDE 0.9% FLUSH
10.0000 mL | Freq: Once | INTRAVENOUS | Status: AC
  Administered 2020-01-24: 10 mL via INTRAVENOUS

## 2020-01-24 MED ORDER — HEPARIN SOD (PORK) LOCK FLUSH 100 UNIT/ML IV SOLN
500.0000 [IU] | Freq: Once | INTRAVENOUS | Status: AC | PRN
  Administered 2020-01-24: 500 [IU]

## 2020-01-24 NOTE — Progress Notes (Signed)
Patients port flushed without difficulty.  Good blood return noted with no bruising or swelling noted at site.  Transparent dressing applied.  Patient remains accessed for chemotherapy treatment.  

## 2020-01-24 NOTE — Progress Notes (Signed)
Pt here for C7D8 of taxotere/gemzar.  Pt is eating fair.  Complaints of not a lot of energy. Labs WNL for treatment today.   Tolerated treatment well today without incidence.  Vital signs stable prior to discharge.  Discharged in stable condition ambulatory.   Marland KitchenElta Guadeloupe R [name] today for neulasta OBI placement per MD orders. OBI device filled per protocol and placed on left Upper Arm. Needle/catheter placement noted prior to patient leaving. Tolerated without incident and aware of injection to be delivered in  27 hours.

## 2020-01-24 NOTE — Patient Instructions (Signed)
[facility] Discharge Instructions for Patients Receiving Chemotherapy  Today you received the following chemotherapy agents   To help prevent nausea and vomiting after your treatment, we encourage you to take your nausea medication   If you develop nausea and vomiting that is not controlled by your nausea medication, call the clinic.   BELOW ARE SYMPTOMS THAT SHOULD BE REPORTED IMMEDIATELY:  *FEVER GREATER THAN 100.5 F  *CHILLS WITH OR WITHOUT FEVER  NAUSEA AND VOMITING THAT IS NOT CONTROLLED WITH YOUR NAUSEA MEDICATION  *UNUSUAL SHORTNESS OF BREATH  *UNUSUAL BRUISING OR BLEEDING  TENDERNESS IN MOUTH AND THROAT WITH OR WITHOUT PRESENCE OF ULCERS  *URINARY PROBLEMS  *BOWEL PROBLEMS  UNUSUAL RASH Items with * indicate a potential emergency and should be followed up as soon as possible.  Feel free to call the clinic should you have any questions or concerns. The clinic phone number is [phone].  Please show the CHEMO ALERT CARD at check-in to the Emergency Department and triage nurse.   

## 2020-01-29 ENCOUNTER — Ambulatory Visit (HOSPITAL_COMMUNITY): Payer: 59

## 2020-01-30 ENCOUNTER — Other Ambulatory Visit (HOSPITAL_COMMUNITY): Payer: Self-pay

## 2020-01-30 ENCOUNTER — Other Ambulatory Visit (HOSPITAL_COMMUNITY): Payer: Self-pay | Admitting: *Deleted

## 2020-01-30 DIAGNOSIS — C499 Malignant neoplasm of connective and soft tissue, unspecified: Secondary | ICD-10-CM

## 2020-01-30 MED ORDER — HYDROCODONE-HOMATROPINE 5-1.5 MG/5ML PO SYRP: 10.0000 mL | ORAL_SOLUTION | Freq: Four times a day (QID) | ORAL | 0 refills | Status: DC | PRN

## 2020-01-31 ENCOUNTER — Inpatient Hospital Stay (HOSPITAL_BASED_OUTPATIENT_CLINIC_OR_DEPARTMENT_OTHER): Payer: 59 | Admitting: Hematology

## 2020-01-31 ENCOUNTER — Inpatient Hospital Stay (HOSPITAL_COMMUNITY): Payer: 59

## 2020-01-31 ENCOUNTER — Other Ambulatory Visit: Payer: Self-pay

## 2020-01-31 VITALS — BP 111/81 | HR 118 | Temp 96.9°F | Resp 20 | Wt 207.8 lb

## 2020-01-31 DIAGNOSIS — C4922 Malignant neoplasm of connective and soft tissue of left lower limb, including hip: Secondary | ICD-10-CM

## 2020-01-31 DIAGNOSIS — Z5111 Encounter for antineoplastic chemotherapy: Secondary | ICD-10-CM | POA: Diagnosis not present

## 2020-01-31 LAB — COMPREHENSIVE METABOLIC PANEL
ALT: 33 U/L (ref 0–44)
AST: 38 U/L (ref 15–41)
Albumin: 3.5 g/dL (ref 3.5–5.0)
Alkaline Phosphatase: 169 U/L — ABNORMAL HIGH (ref 38–126)
Anion gap: 10 (ref 5–15)
BUN: 10 mg/dL (ref 6–20)
CO2: 25 mmol/L (ref 22–32)
Calcium: 8.8 mg/dL — ABNORMAL LOW (ref 8.9–10.3)
Chloride: 101 mmol/L (ref 98–111)
Creatinine, Ser: 0.83 mg/dL (ref 0.61–1.24)
GFR, Estimated: >60 mL/min (ref >60)
Glucose, Bld: 139 mg/dL — ABNORMAL HIGH (ref 70–99)
Potassium: 3.6 mmol/L (ref 3.5–5.1)
Sodium: 136 mmol/L (ref 135–145)
Total Bilirubin: 0.7 mg/dL (ref 0.3–1.2)
Total Protein: 6.7 g/dL (ref 6.5–8.1)

## 2020-01-31 LAB — CBC WITH DIFFERENTIAL/PLATELET
Band Neutrophils: 1 %
Basophils Absolute: 0 10*3/uL (ref 0.0–0.1)
Basophils Relative: 0 %
Eosinophils Absolute: 0.3 10*3/uL (ref 0.0–0.5)
Eosinophils Relative: 3 %
HCT: 40.1 % (ref 39.0–52.0)
Hemoglobin: 12.8 g/dL — ABNORMAL LOW (ref 13.0–17.0)
Lymphocytes Relative: 6 %
Lymphs Abs: 0.7 10*3/uL (ref 0.7–4.0)
MCH: 32.5 pg (ref 26.0–34.0)
MCHC: 31.9 g/dL (ref 30.0–36.0)
MCV: 101.8 fL — ABNORMAL HIGH (ref 80.0–100.0)
Metamyelocytes Relative: 1 %
Monocytes Absolute: 0.6 10*3/uL (ref 0.1–1.0)
Monocytes Relative: 5 %
Myelocytes: 4 %
Neutro Abs: 8.6 10*3/uL — ABNORMAL HIGH (ref 1.7–7.7)
Neutrophils Relative %: 77 %
Platelets: 173 10*3/uL (ref 150–400)
Promyelocytes Relative: 3 %
RBC: 3.94 MIL/uL — ABNORMAL LOW (ref 4.22–5.81)
RDW: 18.4 % — ABNORMAL HIGH (ref 11.5–15.5)
WBC: 11 10*3/uL — ABNORMAL HIGH (ref 4.0–10.5)
nRBC: 0.4 % — ABNORMAL HIGH (ref 0.0–0.2)

## 2020-01-31 LAB — MAGNESIUM: Magnesium: 1.9 mg/dL (ref 1.7–2.4)

## 2020-01-31 MED ORDER — HEPARIN SOD (PORK) LOCK FLUSH 100 UNIT/ML IV SOLN
500.0000 [IU] | Freq: Once | INTRAVENOUS | Status: AC
  Administered 2020-01-31: 500 [IU] via INTRAVENOUS

## 2020-01-31 MED ORDER — MEGESTROL ACETATE 400 MG/10ML PO SUSP: 400.0000 mg | Freq: Two times a day (BID) | ORAL | 1 refills | Status: DC

## 2020-01-31 MED ORDER — SODIUM CHLORIDE 0.9% FLUSH
20.0000 mL | INTRAVENOUS | Status: DC | PRN
  Administered 2020-01-31: 20 mL via INTRAVENOUS

## 2020-01-31 MED ORDER — ZOLPIDEM TARTRATE 10 MG PO TABS: 10.0000 mg | ORAL_TABLET | Freq: Every evening | ORAL | 0 refills | Status: DC | PRN

## 2020-01-31 NOTE — Progress Notes (Signed)
1040 Labs reviewed with and pt seen by Dr. Delton Coombes and pt's chemo tx to be held today per MD. Pt discharged via wheelchair in stable condition accompanied by his wife

## 2020-01-31 NOTE — Patient Instructions (Signed)
[facility] at [facility] Discharge Instructions  Labs drawn from portacath today   Thank you for choosing [facility] at [facility] to provide your oncology and hematology care.  To afford each patient quality time with our provider, please arrive at least 15 minutes before your scheduled appointment time.   If you have a lab appointment with the Cancer Center please come in thru the Main Entrance and check in at the main information desk.  You need to re-schedule your appointment should you arrive 10 or more minutes late.  We strive to give you quality time with our providers, and arriving late affects you and other patients whose appointments are after yours.  Also, if you no show three or more times for appointments you may be dismissed from the clinic at the providers discretion.     Again, thank you for choosing [facility].  Our hope is that these requests will decrease the amount of time that you wait before being seen by our physicians.       _____________________________________________________________  Should you have questions after your visit to [facility], please contact our office at [phone] and follow the prompts.  Our office hours are 8:00 a.m. and 4:30 p.m. Monday - Friday.  Please note that voicemails left after 4:00 p.m. may not be returned until the following business day.  We are closed weekends and major holidays.  You do have access to a nurse 24-7, just call the main number to the clinic [phone] and do not press any options, hold on the line and a nurse will answer the phone.    For prescription refill requests, have your pharmacy contact our office and allow 72 hours.    Due to Covid, you will need to wear a mask upon entering the hospital. If you do not have a mask, a mask will be given to you at the Main Entrance upon arrival. For doctor visits, patients may have 1 support person age 18  or older with them. For treatment visits, patients can not have anyone with them due to social distancing guidelines and our immunocompromised population.     

## 2020-01-31 NOTE — Patient Instructions (Signed)
[place] at [facility] Discharge Instructions  You were seen today by Dr. Delton Coombes. He went over your recent results. You will be prescribed Megace to take 2 teaspoons twice a day, in the morning and evening, to stimulate your appetite. You will be prescribed Ambien 10 mg to take at bedtime for your sleep. Keep your appointment to have your CT scan done. Dr. Delton Coombes will see you back in 1 week for labs and follow up.   Thank you for choosing [place] at [facility] to provide your oncology and hematology care.  To afford each patient quality time with our provider, please arrive at least 15 minutes before your scheduled appointment time.   If you have a lab appointment with the [facility] please come in thru the Main Entrance and check in at the main information desk  You need to re-schedule your appointment should you arrive 10 or more minutes late.  We strive to give you quality time with our providers, and arriving late affects you and other patients whose appointments are after yours.  Also, if you no show three or more times for appointments you may be dismissed from the clinic at the providers discretion.     Again, thank you for choosing [facility].  Our hope is that these requests will decrease the amount of time that you wait before being seen by our physicians.       _____________________________________________________________  Should you have questions after your visit to [facility], please contact our office at [phone] between the hours of 8:00 a.m. and 4:30 p.m.  Voicemails left after 4:00 p.m. will not be returned until the following business day.  For prescription refill requests, have your pharmacy contact our office and allow 72 hours.    Cancer Center Support Programs:   > Cancer Support Group  2nd Tuesday of the month 1pm-2pm, Journey Room

## 2020-01-31 NOTE — Progress Notes (Signed)
[facility] [address] [place] [address]   CLINIC:  Medical Oncology/Hematology  PCP:  Celene Squibb, MD [name] [address] [phone]   REASON FOR VISIT:  Follow-up for metastatic dedifferentiated liposarcoma  PRIOR THERAPY:  1. Gemcitabine & docetaxel followed by wide local excision on 11/10/2018. 2. SBRT to right lung 50 Gy in 10 fractions from 11/14/2019 to 11/29/2019.  NGS Results: Pending  CURRENT THERAPY: Gemcitabine on day 1 & 8 with docetaxel on day 8 every 4 weeks  BRIEF ONCOLOGIC HISTORY:  Oncology History  Liposarcoma of left lower extremity ([place])  04/25/2019 Initial Diagnosis   Liposarcoma of left lower extremity ([place])   05/22/2019 - 07/04/2019 Chemotherapy   The patient had DOXOrubicin (ADRIAMYCIN) chemo injection 174 mg, 75 mg/m2 = 174 mg, Intravenous,  Once, 3 of 6 cycles Administration: 174 mg (05/22/2019), 174 mg (06/13/2019), 174 mg (07/04/2019) palonosetron (ALOXI) injection 0.25 mg, 0.25 mg, Intravenous,  Once, 3 of 6 cycles Administration: 0.25 mg (05/22/2019), 0.25 mg (06/13/2019), 0.25 mg (07/04/2019) fosaprepitant (EMEND) 150 mg in sodium chloride 0.9 % 145 mL IVPB, 150 mg, Intravenous,  Once, 3 of 6 cycles Administration: 150 mg (05/22/2019), 150 mg (06/13/2019), 150 mg (07/04/2019)  for chemotherapy treatment.    08/11/2019 -  Chemotherapy    Patient is on Treatment Plan: LIPOSARCOMA GEMCITABINE D1,8 / DOCETAXEL D8 Q21D      Liposarcoma ([place])  05/15/2019 Initial Diagnosis   Liposarcoma ([place])   05/15/2019 Cancer Staging   Staging form: Primary Cutaneous B-Cell/T-Cell Lymphoma (Non-MF/SS Lymphoma), AJCC 8th Edition - Clinical stage from 05/15/2019: [name], pM1 - Signed by [name], MD on 05/15/2019     CANCER STAGING: Cancer Staging Liposarcoma [facility]) Staging form: Primary Cutaneous B-Cell/T-Cell Lymphoma (Non-MF/SS Lymphoma), AJCC 8th Edition - Clinical stage from 05/15/2019: [name], pM1 -  Signed by [name], MD on 05/15/2019  Liposarcoma of left lower extremity ([place]) Staging form: Primary Cutaneous B-Cell/T-Cell Lymphoma (Non-MF/SS Lymphoma), AJCC 8th Edition - Clinical: No stage assigned - Unsigned   INTERVAL HISTORY:  Leonard [name], a 60 y.o. male, returns for routine follow-up of his metastatic dedifferentiated liposarcoma. Leonard [name] was last seen on 01/10/2020.   Today he is accompanied by his wife and he reports feeling okay. He is down 5 lbs in the past 3 weeks due to decreased appetite; he is drinking 2-3 cans of Ensure daily along with 1 meal per day, down from 3-4 cans previously. He has a bit of an appetite in the afternoons. He reports having occasional right lower rib pain and reports that his cough and SOB have worsened along with wheezing; he continues taking Hycodan twice to TID. He denies having mouth sores. He wakes up frequently at night, approximately every hour, and takes Xanax; he also has daytime sleepiness.  He is more sedentary at home due to fatigue and worsening SOB. His CT CAP is scheduled for 1/25.   REVIEW OF SYSTEMS:  Review of Systems  Constitutional: Positive for appetite change (25%), fatigue (25%) and unexpected weight change (lost 5 lbs in 3 weeks).  HENT:   Negative for mouth sores.   Respiratory: Positive for cough (worsening), shortness of breath (worsening) and wheezing.   Cardiovascular: Positive for chest pain (R lower chest wall pain occasionally).  Gastrointestinal: Positive for constipation.  Neurological: Positive for headaches.  Psychiatric/Behavioral: Positive for depression and sleep disturbance. The patient is nervous/anxious.   All other systems reviewed and are  negative.   PAST MEDICAL/SURGICAL HISTORY:  Past Medical History:  Diagnosis Date  . Cancer ([place])   . Port-A-Cath in place 05/19/2019   right   Past Surgical History:  Procedure Laterality Date  . CHOLECYSTECTOMY    . ESOPHAGEAL DILATION N/A  06/27/2017   Procedure: ESOPHAGEAL DILATION;  Surgeon: Danie Binder, MD;  Location: [facility] ENDO SUITE;  Service: Endoscopy;  Laterality: N/A;  . ESOPHAGOGASTRODUODENOSCOPY N/A 06/27/2017   Procedure: ESOPHAGOGASTRODUODENOSCOPY (EGD);  Surgeon: Danie Binder, MD;  Location: [facility] ENDO SUITE;  Service: Endoscopy;  Laterality: N/A;  . IR IMAGING GUIDED PORT INSERTION  05/04/2019  . knee sx      SOCIAL HISTORY:  Social History   Socioeconomic History  . Marital status: Married    Spouse name: Not on file  . Number of children: 1  . Years of education: Not on file  . Highest education level: Not on file  Occupational History  . Occupation: Unemployed  Tobacco Use  . Smoking status: Former Research scientist (life sciences)  . Smokeless tobacco: Never Used  Vaping Use  . Vaping Use: Never used  Substance and Sexual Activity  . Alcohol use: Yes    Comment: 1-2 drinks per month  . Drug use: Never  . Sexual activity: Not Currently  Other Topics Concern  . Not on file  Social History Narrative   DOES CONSTRUCTION. MARRIED. RARE ETOH. NO TOBACCO.   Social Determinants of Health   Financial Resource Strain: Medium Risk  . Difficulty of Paying Living Expenses: Somewhat hard  Food Insecurity: No Food Insecurity  . Worried About Charity fundraiser in the Last Year: Never true  . Ran Out of Food in the Last Year: Never true  Transportation Needs: No Transportation Needs  . Lack of Transportation (Medical): No  . Lack of Transportation (Non-Medical): No  Physical Activity: Inactive  . Days of Exercise per Week: 0 days  . Minutes of Exercise per Session: 0 min  Stress: Stress Concern Present  . Feeling of Stress : To some extent  Social Connections: Moderately Integrated  . Frequency of Communication with Friends and Family: More than three times a week  . Frequency of Social Gatherings with Friends and Family: Once a week  . Attends Religious Services: More than 4 times per year  . Active Member of Clubs or  Organizations: No  . Attends Archivist Meetings: Never  . Marital Status: Married  Human resources officer Violence: Not At Risk  . Fear of Current or Ex-Partner: No  . Emotionally Abused: No  . Physically Abused: No  . Sexually Abused: No    FAMILY HISTORY:  Family History  Problem Relation Age of Onset  . Heart disease Father   . Cancer Maternal Aunt   . Cancer Maternal Uncle   . Cancer Paternal Uncle   . Vision loss Maternal Grandfather   . Colon cancer Neg Hx   . Colon polyps Neg Hx     CURRENT MEDICATIONS:  Current Outpatient Medications  Medication Sig Dispense Refill  . ALPRAZolam (XANAX) 0.25 MG tablet Take 1 tablet (0.25 mg total) by mouth 2 (two) times daily as needed for anxiety. 60 tablet 3  . aluminum-magnesium hydroxide-simethicone (MAALOX) 063-016-01 MG/5ML SUSP Swish and swallow 5 ml four times daily as needed for mouth sores. 600 mL 2  . DOCEtaxel (TAXOTERE IV) Inject into the vein.    Marland Kitchen escitalopram (LEXAPRO) 10 MG tablet Take 1 tablet (10 mg total) by mouth daily. 30 tablet  2  . Gemcitabine HCl (GEMZAR IV) Inject into the vein.    Marland Kitchen HYDROcodone-homatropine (HYCODAN) 5-1.5 MG/5ML syrup Take 10 mLs by mouth every 6 (six) hours as needed for cough. SMARTSIG:1 Teaspoon By Mouth Every 6 Hours PRN 480 mL 0  . lidocaine (XYLOCAINE) 2 % solution     . megestrol (MEGACE) 400 MG/10ML suspension Take 10 mLs (400 mg total) by mouth 2 (two) times daily. 480 mL 1  . Oxycodone HCl 10 MG TABS Take 0.5 tablets (5 mg total) by mouth every 8 (eight) hours as needed. 30 tablet 0  . prochlorperazine (COMPAZINE) 10 MG tablet TAKE (1) TABLET BY MOUTH EVERY (6) HOURS AS NEEDED. 30 tablet 0   No current facility-administered medications for this visit.   Facility-Administered Medications Ordered in Other Visits  Medication Dose Route Frequency Provider Last Rate Last Admin  . sodium chloride flush (NS) 0.9 % injection 10 mL  10 mL Intravenous PRN [name], MD   10 mL  at 08/21/19 1508    ALLERGIES:  No Known Allergies  PHYSICAL EXAM:  Performance status (ECOG): 1 - Symptomatic but completely ambulatory  Vitals:   01/31/20 0919  BP: 111/81  Pulse: (!) 118  Resp: 20  Temp: (!) 96.9 F (36.1 C)  SpO2: 95%   Wt Readings from Last 3 Encounters:  01/31/20 207 lb 12.8 oz (94.3 kg)  01/24/20 207 lb 1.6 oz (93.9 kg)  01/22/20 209 lb (94.8 kg)   Physical Exam Vitals reviewed.  Constitutional:      Appearance: Normal appearance.  Cardiovascular:     Rate and Rhythm: Normal rate and regular rhythm.     Pulses: Normal pulses.     Heart sounds: Normal heart sounds.  Pulmonary:     Effort: Pulmonary effort is normal.     Breath sounds: Normal breath sounds.  Chest:     Comments: Port-a-Cath in R chest Neurological:     General: No focal deficit present.     Mental Status: He is alert and oriented to person, place, and time.  Psychiatric:        Mood and Affect: Mood normal.        Behavior: Behavior normal.      LABORATORY DATA:  I have reviewed the labs as listed.  CBC Latest Ref Rng & Units 01/31/2020 01/24/2020 01/17/2020  WBC 4.0 - 10.5 K/uL 11.0(H) 4.6 1.8(L)  Hemoglobin 13.0 - 17.0 g/dL 12.8(L) 12.6(L) 11.7(L)  Hematocrit 39.0 - 52.0 % 40.1 39.5 35.8(L)  Platelets 150 - 400 K/uL 173 323 261   CMP Latest Ref Rng & Units 01/31/2020 01/24/2020 01/17/2020  Glucose 70 - 99 mg/dL 139(H) 121(H) 134(H)  BUN 6 - 20 mg/dL 10 11 13   Creatinine 0.61 - 1.24 mg/dL 0.83 0.78 0.69  Sodium 135 - 145 mmol/L 136 137 139  Potassium 3.5 - 5.1 mmol/L 3.6 3.4(L) 3.4(L)  Chloride 98 - 111 mmol/L 101 103 104  CO2 22 - 32 mmol/L 25 25 24   Calcium 8.9 - 10.3 mg/dL 8.8(L) 8.6(L) 8.8(L)  Total Protein 6.5 - 8.1 g/dL 6.7 6.5 6.3(L)  Total Bilirubin 0.3 - 1.2 mg/dL 0.7 0.7 0.7  Alkaline Phos 38 - 126 U/L 169(H) 114 122  AST 15 - 41 U/L 38 36 55(H)  ALT 0 - 44 U/L 33 29 43    DIAGNOSTIC IMAGING:  I have independently reviewed the scans and discussed with the  patient. No results found.   ASSESSMENT:  1. Metastatic dedifferentiated liposarcoma: -Left  calf mass biopsy on 08/09/2018 consistent with grade 2 liposarcoma, CHOP gene rearrangement showed gene not disrupted. -Chemoradiation therapy with gemcitabine and docetaxel followed by wide local excision. -Pathology on 11/10/2018 showed liposarcoma with 80% necrosis, grade 2, ypT4, NX, positive deep margin, amplification of MDM 2 detected. Amplification is associated with well-differentiated liposarcoma/atypical lipomatous tumors and dedifferentiated liposarcoma. -CT CAP in the ER on 04/24/2019 showed widespread pulmonary metastatic lesions throughout both lungs, largest left upper lobe mass measuring 4.7 x 4.2 cm. Mass in the inferior lingula measures 4.12 x 3.2 cm. -Bone scan on 05/02/2019 did not show any evidence of metastatic disease. -2D echo on 05/02/2019 shows EF 60 to 65%. -Adriamycin every 3 weeks started on 05/22/2019. -CT CAP on 07/19/2019 showed dominant lung lesions have decreased in size. 2 nodules in the right major fissure have progressed while some other pulmonary nodules are unchanged. No evidence of metastatic disease in the abdomen or pelvis. -Gemcitabine and docetaxel every 21 days started on 08/11/2019. -CT CAP on 11/01/2019 showed interval improvement in the pulmonary metastatic disease but new 5 mm nodule in the right upper lobe. Right lower lobe lesion has increased in volume. Lytic focus in the left posterior fourth rib is slightly prominent. Hepatic steatosis. -Right lung IMRT, 5 Gy, 10 fractions.  2. Left kidney lesion: -CT on 04/24/2019 showed mass arising in the periphery of the upper lobe of the left kidney measuring 1.3 x 1.4 cm. -CT scan [facility] in August 2020 was showing left kidney lesion. -Current scan on 07/19/2019 showed 15 mm hypoattenuating lesion in the upper pole left kidney stable since prior.   PLAN:  1. Metastatic dedifferentiated  liposarcoma: -He has completed 7 cycles on 01/10/2020.  Last treatment on 01/24/2020. - Due to insurance reasons, his CAT scan is delayed until 02/06/2020. - I will hold off on his treatment today.  I have reviewed his labs which showed elevated alk phos of 169.  We will closely monitor.  CBC was grossly normal. - We will reevaluate him after the CT scans.  We will have to change the regimen if there is progression noted on the scans.  2. Dry cough: -He is using hydrocodone 10 cc every 6 hours as needed.  He is noting that cough is slightly worse.  3. Anxiety: -Continue Xanax as needed.  4. Right-sided chest wall pain: -This has improved.  He has bilateral rib pain only when he coughs.  5. Weight loss: -He lost 5 pounds in the last 3 weeks.  He is eating only 1 meal per day.  He is drinking boost 2 to 3 cans/day. - We will start him on Megace 400 mg twice daily.  6. Depression: -Continue Lexapro daily.  7. sleeping difficulty: -We will start him on Ambien 10 mg at bedtime.   Orders placed this encounter:  No orders of the defined types were placed in this encounter.    [name], MD [facility] [phone]   I, Milinda Antis, am acting as a scribe for Dr. Sanda Linger.  I, [name] MD, have reviewed the above documentation for accuracy and completeness, and I agree with the above.

## 2020-01-31 NOTE — Progress Notes (Signed)
Patient assessed and labs reviewed by Dr. Delton Coombes. No treatment today. Primary RN and pharmacy aware.

## 2020-02-05 ENCOUNTER — Telehealth (HOSPITAL_COMMUNITY): Payer: Self-pay

## 2020-02-05 NOTE — Telephone Encounter (Signed)
This nurse spoke with patients spouse.  She states that patient started slurring his speech on last night and has continued today.  Patient also took a nap earlier today and when he awoke from the nap the left side of his face was numb and it lasted about 15 minutes and then went away.  This nurse explained to wife that patient should be brought to ER for evaluation. She then stated to this nurse that the numbness went away and she has been monitoring him closely and if there are anymore changes she will call EMS to come and get him at that time.  Dr. Delton Coombes has been made aware.

## 2020-02-06 ENCOUNTER — Other Ambulatory Visit: Payer: Self-pay

## 2020-02-06 ENCOUNTER — Ambulatory Visit (HOSPITAL_COMMUNITY)
Admission: RE | Admit: 2020-02-06 | Discharge: 2020-02-06 | Disposition: A | Discharge status: Home / Self Care | Payer: 59 | Source: Ambulatory Visit | Attending: Hematology | Admitting: Hematology

## 2020-02-06 DIAGNOSIS — C7802 Secondary malignant neoplasm of left lung: Secondary | ICD-10-CM | POA: Diagnosis not present

## 2020-02-06 DIAGNOSIS — C4922 Malignant neoplasm of connective and soft tissue of left lower limb, including hip: Secondary | ICD-10-CM

## 2020-02-06 DIAGNOSIS — J939 Pneumothorax, unspecified: Secondary | ICD-10-CM | POA: Diagnosis not present

## 2020-02-06 MED ORDER — IOHEXOL 300 MG/ML  SOLN
100.0000 mL | Freq: Once | INTRAMUSCULAR | Status: AC | PRN
  Administered 2020-02-06: 100 mL via INTRAVENOUS

## 2020-02-07 ENCOUNTER — Other Ambulatory Visit: Payer: Self-pay

## 2020-02-07 ENCOUNTER — Inpatient Hospital Stay (HOSPITAL_COMMUNITY): Payer: 59

## 2020-02-07 ENCOUNTER — Emergency Department (HOSPITAL_COMMUNITY): Payer: 59

## 2020-02-07 ENCOUNTER — Inpatient Hospital Stay (HOSPITAL_COMMUNITY)
Admission: EM | Admit: 2020-02-07 | Discharge: 2020-02-16 | DRG: 181 | Disposition: A | Discharge status: Home / Self Care | Payer: 59 | Attending: Internal Medicine | Admitting: Internal Medicine

## 2020-02-07 ENCOUNTER — Inpatient Hospital Stay (HOSPITAL_BASED_OUTPATIENT_CLINIC_OR_DEPARTMENT_OTHER): Payer: 59 | Admitting: Hematology

## 2020-02-07 ENCOUNTER — Encounter (HOSPITAL_COMMUNITY): Payer: Self-pay

## 2020-02-07 VITALS — BP 120/72 | HR 105 | Temp 96.6°F | Resp 20 | Wt 205.9 lb

## 2020-02-07 DIAGNOSIS — Z79899 Other long term (current) drug therapy: Secondary | ICD-10-CM

## 2020-02-07 DIAGNOSIS — J982 Interstitial emphysema: Secondary | ICD-10-CM | POA: Diagnosis present

## 2020-02-07 DIAGNOSIS — Z95828 Presence of other vascular implants and grafts: Secondary | ICD-10-CM

## 2020-02-07 DIAGNOSIS — C499 Malignant neoplasm of connective and soft tissue, unspecified: Secondary | ICD-10-CM | POA: Diagnosis present

## 2020-02-07 DIAGNOSIS — F419 Anxiety disorder, unspecified: Secondary | ICD-10-CM | POA: Diagnosis present

## 2020-02-07 DIAGNOSIS — J9382 Other air leak: Secondary | ICD-10-CM | POA: Diagnosis present

## 2020-02-07 DIAGNOSIS — Z9049 Acquired absence of other specified parts of digestive tract: Secondary | ICD-10-CM

## 2020-02-07 DIAGNOSIS — J9312 Secondary spontaneous pneumothorax: Secondary | ICD-10-CM | POA: Diagnosis present

## 2020-02-07 DIAGNOSIS — J948 Other specified pleural conditions: Secondary | ICD-10-CM | POA: Diagnosis present

## 2020-02-07 DIAGNOSIS — R0689 Other abnormalities of breathing: Secondary | ICD-10-CM

## 2020-02-07 DIAGNOSIS — C7951 Secondary malignant neoplasm of bone: Secondary | ICD-10-CM | POA: Diagnosis present

## 2020-02-07 DIAGNOSIS — Z20822 Contact with and (suspected) exposure to covid-19: Secondary | ICD-10-CM | POA: Diagnosis present

## 2020-02-07 DIAGNOSIS — C7802 Secondary malignant neoplasm of left lung: Principal | ICD-10-CM | POA: Diagnosis present

## 2020-02-07 DIAGNOSIS — C7801 Secondary malignant neoplasm of right lung: Secondary | ICD-10-CM | POA: Diagnosis present

## 2020-02-07 DIAGNOSIS — J939 Pneumothorax, unspecified: Secondary | ICD-10-CM | POA: Diagnosis not present

## 2020-02-07 DIAGNOSIS — K5900 Constipation, unspecified: Secondary | ICD-10-CM | POA: Diagnosis not present

## 2020-02-07 DIAGNOSIS — R197 Diarrhea, unspecified: Secondary | ICD-10-CM | POA: Diagnosis present

## 2020-02-07 DIAGNOSIS — Z8249 Family history of ischemic heart disease and other diseases of the circulatory system: Secondary | ICD-10-CM

## 2020-02-07 DIAGNOSIS — Z7189 Other specified counseling: Secondary | ICD-10-CM | POA: Diagnosis not present

## 2020-02-07 DIAGNOSIS — R0600 Dyspnea, unspecified: Secondary | ICD-10-CM

## 2020-02-07 DIAGNOSIS — C4922 Malignant neoplasm of connective and soft tissue of left lower limb, including hip: Secondary | ICD-10-CM

## 2020-02-07 DIAGNOSIS — Z9221 Personal history of antineoplastic chemotherapy: Secondary | ICD-10-CM

## 2020-02-07 DIAGNOSIS — Z6829 Body mass index (BMI) 29.0-29.9, adult: Secondary | ICD-10-CM

## 2020-02-07 DIAGNOSIS — Z821 Family history of blindness and visual loss: Secondary | ICD-10-CM

## 2020-02-07 DIAGNOSIS — Z9689 Presence of other specified functional implants: Secondary | ICD-10-CM

## 2020-02-07 DIAGNOSIS — F32A Depression, unspecified: Secondary | ICD-10-CM | POA: Diagnosis present

## 2020-02-07 DIAGNOSIS — E669 Obesity, unspecified: Secondary | ICD-10-CM | POA: Diagnosis present

## 2020-02-07 DIAGNOSIS — Z515 Encounter for palliative care: Secondary | ICD-10-CM | POA: Diagnosis not present

## 2020-02-07 DIAGNOSIS — Z87891 Personal history of nicotine dependence: Secondary | ICD-10-CM | POA: Diagnosis not present

## 2020-02-07 DIAGNOSIS — Z4789 Encounter for other orthopedic aftercare: Secondary | ICD-10-CM

## 2020-02-07 DIAGNOSIS — R0602 Shortness of breath: Secondary | ICD-10-CM

## 2020-02-07 LAB — CBC WITH DIFFERENTIAL/PLATELET
Abs Immature Granulocytes: 0.08 10*3/uL — ABNORMAL HIGH (ref 0.00–0.07)
Abs Immature Granulocytes: 0.07 10*3/uL (ref 0.00–0.07)
Basophils Absolute: 0 10*3/uL (ref 0.0–0.1)
Basophils Absolute: 0.1 10*3/uL (ref 0.0–0.1)
Basophils Relative: 1 %
Basophils Relative: 1 %
Eosinophils Absolute: 0.1 10*3/uL (ref 0.0–0.5)
Eosinophils Absolute: 0.1 10*3/uL (ref 0.0–0.5)
Eosinophils Relative: 1 %
Eosinophils Relative: 1 %
HCT: 38.3 % — ABNORMAL LOW (ref 39.0–52.0)
HCT: 36.5 % — ABNORMAL LOW (ref 39.0–52.0)
Hemoglobin: 12.3 g/dL — ABNORMAL LOW (ref 13.0–17.0)
Hemoglobin: 11.6 g/dL — ABNORMAL LOW (ref 13.0–17.0)
Immature Granulocytes: 1 %
Immature Granulocytes: 1 %
Lymphocytes Relative: 6 %
Lymphocytes Relative: 6 %
Lymphs Abs: 0.5 10*3/uL — ABNORMAL LOW (ref 0.7–4.0)
Lymphs Abs: 0.5 10*3/uL — ABNORMAL LOW (ref 0.7–4.0)
MCH: 32.1 pg (ref 26.0–34.0)
MCH: 32 pg (ref 26.0–34.0)
MCHC: 32.1 g/dL (ref 30.0–36.0)
MCHC: 31.8 g/dL (ref 30.0–36.0)
MCV: 100 fL (ref 80.0–100.0)
MCV: 100.6 fL — ABNORMAL HIGH (ref 80.0–100.0)
Monocytes Absolute: 0.7 10*3/uL (ref 0.1–1.0)
Monocytes Absolute: 0.8 10*3/uL (ref 0.1–1.0)
Monocytes Relative: 9 %
Monocytes Relative: 9 %
Neutro Abs: 6.7 10*3/uL (ref 1.7–7.7)
Neutro Abs: 8 10*3/uL — ABNORMAL HIGH (ref 1.7–7.7)
Neutrophils Relative %: 82 %
Neutrophils Relative %: 82 %
Platelets: 247 10*3/uL (ref 150–400)
Platelets: 274 10*3/uL (ref 150–400)
RBC: 3.83 MIL/uL — ABNORMAL LOW (ref 4.22–5.81)
RBC: 3.63 MIL/uL — ABNORMAL LOW (ref 4.22–5.81)
RDW: 18.1 % — ABNORMAL HIGH (ref 11.5–15.5)
RDW: 18.2 % — ABNORMAL HIGH (ref 11.5–15.5)
WBC: 8 10*3/uL (ref 4.0–10.5)
WBC: 9.5 10*3/uL (ref 4.0–10.5)
nRBC: 0 % (ref 0.0–0.2)
nRBC: 0 % (ref 0.0–0.2)

## 2020-02-07 LAB — COMPREHENSIVE METABOLIC PANEL
ALT: 22 U/L (ref 0–44)
ALT: 22 U/L (ref 0–44)
AST: 29 U/L (ref 15–41)
AST: 27 U/L (ref 15–41)
Albumin: 3.2 g/dL — ABNORMAL LOW (ref 3.5–5.0)
Albumin: 3.1 g/dL — ABNORMAL LOW (ref 3.5–5.0)
Alkaline Phosphatase: 142 U/L — ABNORMAL HIGH (ref 38–126)
Alkaline Phosphatase: 145 U/L — ABNORMAL HIGH (ref 38–126)
Anion gap: 9 (ref 5–15)
Anion gap: 5 (ref 5–15)
BUN: 12 mg/dL (ref 6–20)
BUN: 12 mg/dL (ref 6–20)
CO2: 25 mmol/L (ref 22–32)
CO2: 27 mmol/L (ref 22–32)
Calcium: 8.5 mg/dL — ABNORMAL LOW (ref 8.9–10.3)
Calcium: 8.3 mg/dL — ABNORMAL LOW (ref 8.9–10.3)
Chloride: 102 mmol/L (ref 98–111)
Chloride: 104 mmol/L (ref 98–111)
Creatinine, Ser: 0.67 mg/dL (ref 0.61–1.24)
Creatinine, Ser: 0.63 mg/dL (ref 0.61–1.24)
GFR, Estimated: >60 mL/min (ref >60)
GFR, Estimated: >60 mL/min (ref >60)
Glucose, Bld: 128 mg/dL — ABNORMAL HIGH (ref 70–99)
Glucose, Bld: 111 mg/dL — ABNORMAL HIGH (ref 70–99)
Potassium: 3.6 mmol/L (ref 3.5–5.1)
Potassium: 3.9 mmol/L (ref 3.5–5.1)
Sodium: 136 mmol/L (ref 135–145)
Sodium: 136 mmol/L (ref 135–145)
Total Bilirubin: 0.9 mg/dL (ref 0.3–1.2)
Total Bilirubin: 0.6 mg/dL (ref 0.3–1.2)
Total Protein: 6.3 g/dL — ABNORMAL LOW (ref 6.5–8.1)
Total Protein: 5.5 g/dL — ABNORMAL LOW (ref 6.5–8.1)

## 2020-02-07 LAB — MAGNESIUM: Magnesium: 2 mg/dL (ref 1.7–2.4)

## 2020-02-07 LAB — SARS CORONAVIRUS 2 BY RT PCR (HOSPITAL ORDER, PERFORMED IN ~~LOC~~ HOSPITAL LAB): SARS Coronavirus 2: NEGATIVE

## 2020-02-07 LAB — HEMOGLOBIN AND HEMATOCRIT, BLOOD
HCT: 34.9 % — ABNORMAL LOW (ref 39.0–52.0)
Hemoglobin: 11.2 g/dL — ABNORMAL LOW (ref 13.0–17.0)

## 2020-02-07 MED ORDER — ESCITALOPRAM OXALATE 10 MG PO TABS
10.0000 mg | ORAL_TABLET | Freq: Every day | ORAL | Status: DC
  Administered 2020-02-08 – 2020-02-16 (×9): 10 mg via ORAL
  Filled 2020-02-07 (×9): qty 1

## 2020-02-07 MED ORDER — FENTANYL CITRATE (PF) 100 MCG/2ML IJ SOLN
100.0000 ug | Freq: Once | INTRAMUSCULAR | Status: AC | PRN
  Administered 2020-02-07: 100 ug via INTRAVENOUS
  Filled 2020-02-07: qty 2

## 2020-02-07 MED ORDER — HYDROCODONE-HOMATROPINE 5-1.5 MG/5ML PO SYRP
10.0000 mL | ORAL_SOLUTION | Freq: Four times a day (QID) | ORAL | Status: DC | PRN
  Filled 2020-02-07: qty 10

## 2020-02-07 MED ORDER — ACETAMINOPHEN 650 MG RE SUPP: 650.0000 mg | Freq: Four times a day (QID) | RECTAL | Status: DC | PRN

## 2020-02-07 MED ORDER — SODIUM CHLORIDE 0.9% FLUSH
10.0000 mL | INTRAVENOUS | Status: DC | PRN
  Administered 2020-02-07: 10 mL via INTRAVENOUS

## 2020-02-07 MED ORDER — MEGESTROL ACETATE 400 MG/10ML PO SUSP
400.0000 mg | Freq: Two times a day (BID) | ORAL | Status: DC
  Administered 2020-02-09 – 2020-02-10 (×3): 400 mg via ORAL
  Filled 2020-02-07 (×19): qty 10

## 2020-02-07 MED ORDER — ONDANSETRON HCL 4 MG PO TABS: 4.0000 mg | ORAL_TABLET | Freq: Four times a day (QID) | ORAL | Status: DC | PRN

## 2020-02-07 MED ORDER — ALBUTEROL SULFATE (2.5 MG/3ML) 0.083% IN NEBU
2.5000 mg | INHALATION_SOLUTION | Freq: Four times a day (QID) | RESPIRATORY_TRACT | Status: DC
  Administered 2020-02-08: 2.5 mg via RESPIRATORY_TRACT
  Filled 2020-02-07: qty 3

## 2020-02-07 MED ORDER — LIDOCAINE-EPINEPHRINE (PF) 2 %-1:200000 IJ SOLN
20.0000 mL | Freq: Once | INTRAMUSCULAR | Status: AC
  Administered 2020-02-07: 20 mL
  Filled 2020-02-07: qty 20

## 2020-02-07 MED ORDER — SODIUM CHLORIDE 0.9% FLUSH: 10.0000 mL | Freq: Three times a day (TID) | INTRAVENOUS | Status: DC

## 2020-02-07 MED ORDER — MORPHINE SULFATE (PF) 2 MG/ML IV SOLN
2.0000 mg | INTRAVENOUS | Status: DC | PRN
  Administered 2020-02-07: 2 mg via INTRAVENOUS
  Filled 2020-02-07: qty 1

## 2020-02-07 MED ORDER — ONDANSETRON HCL 4 MG/2ML IJ SOLN: 4.0000 mg | Freq: Four times a day (QID) | INTRAMUSCULAR | Status: DC | PRN

## 2020-02-07 MED ORDER — OXYCODONE HCL 5 MG PO TABS
5.0000 mg | ORAL_TABLET | Freq: Three times a day (TID) | ORAL | Status: DC | PRN
  Administered 2020-02-08 – 2020-02-11 (×4): 5 mg via ORAL
  Filled 2020-02-07 (×4): qty 1

## 2020-02-07 MED ORDER — ALPRAZOLAM 0.25 MG PO TABS
0.2500 mg | ORAL_TABLET | Freq: Two times a day (BID) | ORAL | Status: DC | PRN
  Administered 2020-02-09: 0.25 mg via ORAL
  Filled 2020-02-07: qty 1

## 2020-02-07 MED ORDER — HYDROCODONE-HOMATROPINE 5-1.5 MG/5ML PO SYRP
5.0000 mL | ORAL_SOLUTION | Freq: Four times a day (QID) | ORAL | Status: DC | PRN
  Administered 2020-02-07 – 2020-02-08 (×3): 5 mL via ORAL
  Filled 2020-02-07 (×3): qty 5

## 2020-02-07 MED ORDER — HEPARIN SOD (PORK) LOCK FLUSH 100 UNIT/ML IV SOLN
500.0000 [IU] | Freq: Once | INTRAVENOUS | Status: AC
  Administered 2020-02-07: 500 [IU] via INTRAVENOUS

## 2020-02-07 MED ORDER — ZOLPIDEM TARTRATE 5 MG PO TABS
10.0000 mg | ORAL_TABLET | Freq: Every evening | ORAL | Status: DC | PRN
  Administered 2020-02-07 – 2020-02-08 (×2): 10 mg via ORAL
  Filled 2020-02-07 (×2): qty 2

## 2020-02-07 MED ORDER — ACETAMINOPHEN 325 MG PO TABS
650.0000 mg | ORAL_TABLET | Freq: Four times a day (QID) | ORAL | Status: DC | PRN
  Administered 2020-02-09: 650 mg via ORAL
  Filled 2020-02-07: qty 2

## 2020-02-07 MED ORDER — POLYETHYLENE GLYCOL 3350 17 G PO PACK: 17.0000 g | PACK | Freq: Every day | ORAL | Status: DC | PRN

## 2020-02-07 NOTE — H&P (Signed)
History and Physical    Leonard [name] NUU:[id number] DOB: 03-Jul-1960 DOA: 02/07/2020  PCP: Celene Squibb, MD   Patient coming from: Home  I have personally briefly reviewed patient's old medical records in [place]  Chief Complaint: Difficulty breathing  HPI: Leonard [name] is a 60 y.o. male with medical history significant for lung cancer.  Patient was reffered to the ED after outpatient CT done yesterday showed a large pneumothorax. Worsening of his difficulty breathing and wheezing over the past few days.  He also reported some pain to the left flank.  He denies vomiting, but reports poor oral intake.  Reports intermittent diarrhea.  Patient had CT chest and abdomen with contrast yesterday that showed hydropneumothorax.  With mild spread metastatic disease to the lungs that appear to have progressed from prior study.  Also an enlarging probably enhancing lesion to the left upper pole of the left kidney concerning for neoplasm metastatic or primary. Patient subsequently followed up with his oncologist today and was referred to the ED.  ED Course: Stable vitals.  O2 sats 92 to 95% on room air, placed on 2 L nasal cannula.  Stable CBC and CMP. COVID test negative.  Chest tube was successfully placed in the ED.  Pulmonologist Dr. Melvyn Novas was consulted, agreed with hospitalization here, for now.  Review of Systems: As per HPI all other systems reviewed and negative.  Past Medical History:  Diagnosis Date  . Cancer [facility])    metastatic lung cancer  . Port-A-Cath in place 05/19/2019   right    Past Surgical History:  Procedure Laterality Date  . CHOLECYSTECTOMY    . ESOPHAGEAL DILATION N/A 06/27/2017   Procedure: ESOPHAGEAL DILATION;  Surgeon: Danie Binder, MD;  Location: [facility];  Service: Endoscopy;  Laterality: N/A;  . ESOPHAGOGASTRODUODENOSCOPY N/A 06/27/2017   Procedure: ESOPHAGOGASTRODUODENOSCOPY (EGD);  Surgeon: Danie Binder, MD;  Location: [facility];  Service:  Endoscopy;  Laterality: N/A;  . IR IMAGING GUIDED PORT INSERTION  05/04/2019  . knee sx       reports that he has quit smoking. He has never used smokeless tobacco. He reports current alcohol use. He reports that he does not use drugs.  No Known Allergies  Family History  Problem Relation Age of Onset  . Heart disease Father   . Cancer Maternal Aunt   . Cancer Maternal Uncle   . Cancer Paternal Uncle   . Vision loss Maternal Grandfather   . Colon cancer Neg Hx   . Colon polyps Neg Hx     Prior to Admission medications   Medication Sig Start Date End Date Taking? Authorizing Provider  ALPRAZolam (XANAX) 0.25 MG tablet Take 1 tablet (0.25 mg total) by mouth 2 (two) times daily as needed for anxiety. 10/30/19  Yes [name], MD  DOCEtaxel (TAXOTERE IV) Inject into the vein.   Yes [provider]  escitalopram (LEXAPRO) 10 MG tablet Take 1 tablet (10 mg total) by mouth daily. 12/25/19  Yes Jacquelin Hawking, NP  Gemcitabine HCl (GEMZAR IV) Inject into the vein.   Yes [provider]  HYDROcodone-homatropine (HYCODAN) 5-1.5 MG/5ML syrup Take 10 mLs by mouth every 6 (six) hours as needed for cough. SMARTSIG:1 Teaspoon By Mouth Every 6 Hours PRN Patient taking differently: Take 10 mLs by mouth every 6 (six) hours as needed for cough. 01/30/20  Yes [name], MD  megestrol (MEGACE) 400 MG/10ML suspension Take 10 mLs (400 mg total) by mouth  2 (two) times daily. 01/31/20  Yes [name], MD  Oxycodone HCl 10 MG TABS Take 0.5 tablets (5 mg total) by mouth every 8 (eight) hours as needed. Patient taking differently: Take 5 mg by mouth every 8 (eight) hours as needed (severe pain). 01/03/20  Yes Tanner, Lyndon Code., PA-C  prochlorperazine (COMPAZINE) 10 MG tablet TAKE (1) TABLET BY MOUTH EVERY (6) HOURS AS NEEDED. Patient taking differently: Take 10 mg by mouth every 6 (six) hours as needed for nausea or vomiting. 01/02/20  Yes Iruku, Arletha Pili, MD   zolpidem (AMBIEN) 10 MG tablet Take 1 tablet (10 mg total) by mouth at bedtime as needed for sleep. 01/31/20 03/01/20 Yes [name], MD  aluminum-magnesium hydroxide-simethicone (MAALOX) 200-200-20 MG/5ML SUSP Swish and swallow 5 ml four times daily as needed for mouth sores. 09/21/19   [name], MD    Physical Exam: Vitals:   02/07/20 1400 02/07/20 1430 02/07/20 1500 02/07/20 1530  BP: 119/79 111/78 118/64 113/75  Pulse: 91 92 90 87  Resp:  17 16 (!) 21  Temp:      TempSrc:      SpO2: 98% 99% 92% 97%    Constitutional: NAD, calm, comfortable Vitals:   02/07/20 1400 02/07/20 1430 02/07/20 1500 02/07/20 1530  BP: 119/79 111/78 118/64 113/75  Pulse: 91 92 90 87  Resp:  17 16 (!) 21  Temp:      TempSrc:      SpO2: 98% 99% 92% 97%   Eyes: PERRL, lids and conjunctivae normal ENMT: Mucous membranes are moist.  Neck: normal, supple, no masses, no thyromegaly Respiratory: clear to auscultation bilaterally, no wheezing, no crackles. Normal respiratory effort. No accessory muscle use.  Cardiovascular: Regular rate and rhythm, no murmurs / rubs / gallops. No extremity edema. 2+ pedal pulses.  Abdomen: no tenderness, no masses palpated. No hepatosplenomegaly. Bowel sounds positive.  Musculoskeletal: no clubbing / cyanosis. No joint deformity upper and lower extremities. Good ROM, no contractures. Normal muscle tone.  Skin: no rashes, lesions, ulcers. No induration Neurologic: No apparent cranial abnormality, moving extremities spontaneously. Psychiatric: Normal judgment and insight. Alert and oriented x 3. Normal mood.   Labs on Admission: I have personally reviewed following labs and imaging studies  CBC: Recent Labs  Lab 02/07/20 0901 02/07/20 1447  WBC 8.0 9.5  NEUTROABS 6.7 8.0*  HGB 12.3* 11.6*  HCT 38.3* 36.5*  MCV 100.0 100.6*  PLT 247 716   Basic Metabolic Panel: Recent Labs  Lab 02/07/20 0901 02/07/20 1447  NA 136 136  K 3.6 3.9  CL 102 104   CO2 25 27  GLUCOSE 128* 111*  BUN 12 12  CREATININE 0.67 0.63  CALCIUM 8.5* 8.3*  MG 2.0  --    Liver Function Tests: Recent Labs  Lab 02/07/20 0901 02/07/20 1447  AST 29 27  ALT 22 22  ALKPHOS 142* 145*  BILITOT 0.9 0.6  PROT 6.3* 5.5*  ALBUMIN 3.2* 3.1*   Radiological Exams on Admission: CT CHEST ABDOMEN PELVIS W CONTRAST  Result Date: 02/06/2020 CLINICAL DATA:  60 year old male with history of liposarcoma in the left lower extremity. EXAM: CT CHEST, ABDOMEN, AND PELVIS WITH CONTRAST TECHNIQUE: Multidetector CT imaging of the chest, abdomen and pelvis was performed following the standard protocol during bolus administration of intravenous contrast. CONTRAST:  149mL OMNIPAQUE IOHEXOL 300 MG/ML  SOLN COMPARISON:  CT the chest, abdomen and pelvis 11/01/2019. FINDINGS: CT CHEST FINDINGS Cardiovascular: Heart size is normal. Trace amount of pericardial fluid and/or thickening,  unlikely to be of hemodynamic significance at this time. No associated pericardial calcification. There is aortic atherosclerosis, as well as atherosclerosis of the great vessels of the mediastinum and the coronary arteries, including calcified atherosclerotic plaque in the left main, left anterior descending and right coronary arteries.Right internal jugular single-lumen porta cath with tip terminating at the superior cavoatrial junction. Mediastinum/Nodes: No pathologically enlarged mediastinal or hilar lymph nodes. Esophagus is unremarkable in appearance. No axillary lymphadenopathy. Lungs/Pleura: Multiple large pulmonary nodules and masses are again noted in the lungs bilaterally, generally increased in size compared to the prior examination. The largest of these is in the left upper lobe near the apex (axial image 17 of series 2) measuring 5.5 x 5.6 cm, significantly increased from 2.1 x 1.8 cm on the prior examination. This lesion is centrally low-attenuation, suggesting internal areas of necrosis. Small bilateral  pleural effusions. New large left pneumothorax with considerable atelectasis in the left lung. Musculoskeletal: There are no aggressive appearing lytic or blastic lesions noted in the visualized portions of the skeleton. CT ABDOMEN PELVIS FINDINGS Hepatobiliary: No suspicious cystic or solid hepatic lesions. No intra or extrahepatic biliary ductal dilatation. Status post cholecystectomy. Pancreas: No pancreatic mass. No pancreatic ductal dilatation. No pancreatic or peripancreatic fluid collections or inflammatory changes. Spleen: Unremarkable. Adrenals/Urinary Tract: In the upper pole the left kidney there is enhancing lesion measuring 1.7 cm in diameter, increased in size compared to the prior study (previously 1.2 cm), concerning for neoplasm. Exophytic 1.2 cm low-attenuation lesion in the lower pole the left kidney, compatible with a small simple cyst. Right kidney and bilateral adrenal glands are normal in appearance. No hydroureteronephrosis. Urinary bladder is normal in appearance. Stomach/Bowel: The appearance of the stomach is normal. There is no pathologic dilatation of small bowel or colon. Normal appendix. Vascular/Lymphatic: Aortic atherosclerosis, without evidence of aneurysm or dissection in the abdominal or pelvic vasculature. No lymphadenopathy noted in the abdomen or pelvis. Reproductive: Prostate gland and seminal vesicles are unremarkable in appearance. Other: No significant volume of ascites.  No pneumoperitoneum. Musculoskeletal: There are no aggressive appearing lytic or blastic lesions noted in the visualized portions of the skeleton. IMPRESSION: 1. New large left hydropneumothorax. 2. Widespread metastatic disease to the lungs appears progressive compared to the prior study, as above. 3. Enlarging probably enhancing lesion in the upper pole the left kidney concerning for neoplasm, either metastatic or primary. 4. Small right pleural effusion. 5. Aortic atherosclerosis, in addition to left  main and 2 vessel coronary artery disease. Please note that although the presence of coronary artery calcium documents the presence of coronary artery disease, the severity of this disease and any potential stenosis cannot be assessed on this non-gated CT examination. Assessment for potential risk factor modification, dietary therapy or pharmacologic therapy may be warranted, if clinically indicated. 6. Additional incidental findings, as above. Critical Value/emergent results were called by telephone at the time of interpretation on 02/06/2020 at 11:35 am to provider [facility] , who verbally acknowledged these results. Electronically Signed   By: Vinnie Langton M.D.   On: 02/06/2020 11:36   DG Chest Portable 1 View  Result Date: 02/07/2020 CLINICAL DATA:  Follow-up left pneumothorax.  Lung carcinoma. EXAM: PORTABLE CHEST 1 VIEW COMPARISON:  Prior today FINDINGS: There has been placement of a left pleural pigtail catheter since previous study, with near complete resolution of left pneumothorax since prior exam. Multiple pulmonary nodules and masses are again seen in both lungs, largest in the left upper lobe and right lower  lobe. Small right pleural effusion remains stable. Right-sided Port-A-Cath remains in appropriate position. IMPRESSION: Near complete resolution of left pneumothorax following left pleural catheter placement. Electronically Signed   By: Marlaine Hind M.D.   On: 02/07/2020 14:55   DG Chest Portable 1 View  Result Date: 02/07/2020 CLINICAL DATA:  Left pneumothorax. EXAM: PORTABLE CHEST 1 VIEW COMPARISON:  CT 02/06/2020. FINDINGS: Mediastinum hilar structures normal. Borderline cardiomegaly. Large left pneumothorax noted. Similar finding noted on prior CT. Prominent atelectasis left lung. Multiple prominent bilateral pulmonary masses again noted as noted on prior CT. No acute bony abnormality identified. Contrast in the colon. IMPRESSION: 1. Large left pneumothorax. Similar finding  noted on prior CT. Prominent atelectasis left lung. 2. Multiple prominent bilateral pulmonary masses again noted as noted on prior CT. Electronically Signed   By: Marcello Moores  Register   On: 02/07/2020 13:50    EKG: Independently reviewed.  Sinus rhythm.  QTc 454.  No old EKG to compare.  Nonspecific T wave abnormalities in aVL and V2 only.   Assessment/Plan Principal Problem:   Pneumothorax on left Active Problems:   Liposarcoma (HCC)  Left pneumothorax- CT showed a new large left hydropneumothorax, with widespread metastatic disease to the lungs that have progressed.  Not hypoxic.  Currently on 2 L nasal cannula.   - Chest tube inserted in the ED, postprocedure chest x-ray showed near complete resolution of left pneumothorax, following left pleural catheter placement. -Pulmonologist consulted, okay to admit here for now -Resume home oxycodone 5 mg q. 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  Per notes- Completed 7 cycles of chemo.  Has had progression of disease over 2 chemotherapy regimens.    Depression/anxiety -Resume home escitalopram and 0.25 mg twice daily as needed Xanax.  DVT prophylaxis: Scds Code Status: Full code Family Communication: None at bedside Disposition Plan:  ~/> 2 days Consults called: Pulmonology Admission status: Inpt, tele  I certify that at the point of admission it is my clinical judgment that the patient will require inpatient hospital care spanning beyond 2 midnights from the point of admission due to high intensity of service, high risk for further deterioration and high frequency of surveillance required.    Bethena Roys MD [facility]  02/07/2020, 7:53 PM

## 2020-02-07 NOTE — Patient Instructions (Signed)
[facility] at [facility] Discharge Instructions  Labs drawn from portacath today   Thank you for choosing [facility] at [facility] to provide your oncology and hematology care.  To afford each patient quality time with our provider, please arrive at least 15 minutes before your scheduled appointment time.   If you have a lab appointment with the Cancer Center please come in thru the Main Entrance and check in at the main information desk.  You need to re-schedule your appointment should you arrive 10 or more minutes late.  We strive to give you quality time with our providers, and arriving late affects you and other patients whose appointments are after yours.  Also, if you no show three or more times for appointments you may be dismissed from the clinic at the providers discretion.     Again, thank you for choosing [facility].  Our hope is that these requests will decrease the amount of time that you wait before being seen by our physicians.       _____________________________________________________________  Should you have questions after your visit to [facility], please contact our office at [phone] and follow the prompts.  Our office hours are 8:00 a.m. and 4:30 p.m. Monday - Friday.  Please note that voicemails left after 4:00 p.m. may not be returned until the following business day.  We are closed weekends and major holidays.  You do have access to a nurse 24-7, just call the main number to the clinic [phone] and do not press any options, hold on the line and a nurse will answer the phone.    For prescription refill requests, have your pharmacy contact our office and allow 72 hours.    Due to Covid, you will need to wear a mask upon entering the hospital. If you do not have a mask, a mask will be given to you at the Main Entrance upon arrival. For doctor visits, patients may have 1 support person age 18  or older with them. For treatment visits, patients can not have anyone with them due to social distancing guidelines and our immunocompromised population.     

## 2020-02-07 NOTE — ED Notes (Signed)
Patient given bag lunch at this time.

## 2020-02-07 NOTE — Progress Notes (Signed)
Stopping treatment per Dr Raliegh Ip.

## 2020-02-07 NOTE — Patient Instructions (Addendum)
[facility] at [facility] Discharge Instructions  You were seen today by Dr. Delton Coombes. He went over your recent results and scans. Your chemo was stopped due to progression of your cancer. You had blood drawn today to determine if there is any further available treatment. Dr. Delton Coombes will see you back in 2 weeks for follow up.   Thank you for choosing [place] at [facility] to provide your oncology and hematology care.  To afford each patient quality time with our provider, please arrive at least 15 minutes before your scheduled appointment time.   If you have a lab appointment with the [facility] please come in thru the Main Entrance and check in at the main information desk  You need to re-schedule your appointment should you arrive 10 or more minutes late.  We strive to give you quality time with our providers, and arriving late affects you and other patients whose appointments are after yours.  Also, if you no show three or more times for appointments you may be dismissed from the clinic at the providers discretion.     Again, thank you for choosing [facility].  Our hope is that these requests will decrease the amount of time that you wait before being seen by our physicians.       _____________________________________________________________  Should you have questions after your visit to [facility], please contact our office at [phone] between the hours of 8:00 a.m. and 4:30 p.m.  Voicemails left after 4:00 p.m. will not be returned until the following business day.  For prescription refill requests, have your pharmacy contact our office and allow 72 hours.    Cancer Center Support Programs:   > Cancer Support Group  2nd Tuesday of the month 1pm-2pm, Journey Room

## 2020-02-07 NOTE — ED Provider Notes (Signed)
I discussed the care with the hospitalist Dr. Denton Brick, she has been kind enough to admit this patient   Leonard Chapel, MD 02/07/20 1654

## 2020-02-07 NOTE — Consult Note (Signed)
Full note to follow  I have discussed this case with EDP Dr Regenia Skeeter and see that the ptx is on the same side as a  LUL mass which may be decrotic and  so may not close on its own but doing much better s/p pigtail so ok to admit here and see if it closes and if not will likely need transfer to [facility] in 3-5 days   Christinia Gully, MD Pulmonary and [place] [phone]   After 7:00 pm call Elink  [phone]

## 2020-02-07 NOTE — ED Triage Notes (Signed)
Pt called by cancer center to come to ED due to large left sided pneumothorax. Pt has lung cancer and has difficulty breathing for one week

## 2020-02-07 NOTE — ED Provider Notes (Signed)
[facility] EMERGENCY DEPARTMENT Provider Note   CSN: [id number] Arrival date & time: 02/07/20  1319     History Chief Complaint  Patient presents with  . Shortness of Breath    Leonard [name] is a 60 y.o. male.  HPI 60 year old male presents after being called about an abnormal CT scan.  Patient states he has been having some chest pain and shortness of breath more than typical over the last week or so.  No fevers.  CT scan of her chest/abdomen pelvis was read today and shows large left-sided pneumothorax.  Past Medical History:  Diagnosis Date  . Cancer [facility])    metastatic lung cancer  . Port-A-Cath in place 05/19/2019   right    Patient Active Problem List   Diagnosis Date Noted  . Dehydration 08/21/2019  . Port-A-Cath in place 05/19/2019  . Goals of care, counseling/discussion 05/15/2019  . Liposarcoma ([place]) 05/15/2019  . 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 Leg mass, left 08/02/2018  . Localized swelling of left lower leg 08/01/2018  . Food impaction of esophagus 06/27/2017  . NSAID induced gastritis   . Esophageal dysphagia     Past Surgical History:  Procedure Laterality Date  . CHOLECYSTECTOMY    . ESOPHAGEAL DILATION N/A 06/27/2017   Procedure: ESOPHAGEAL DILATION;  Surgeon: Danie Binder, MD;  Location: [facility] ENDO SUITE;  Service: Endoscopy;  Laterality: N/A;  . ESOPHAGOGASTRODUODENOSCOPY N/A 06/27/2017   Procedure: ESOPHAGOGASTRODUODENOSCOPY (EGD);  Surgeon: Danie Binder, MD;  Location: [facility] ENDO SUITE;  Service: Endoscopy;  Laterality: N/A;  . IR IMAGING GUIDED PORT INSERTION  05/04/2019  . knee sx         Family History  Problem Relation Age of Onset  . Heart disease Father   . Cancer Maternal Aunt   . Cancer Maternal Uncle   . Cancer Paternal Uncle   . Vision loss Maternal Grandfather   . Colon cancer Neg Hx   . Colon polyps Neg Hx     Social History   Tobacco Use  . Smoking status: Former Research scientist (life sciences)  . Smokeless tobacco:  Never Used  Vaping Use  . Vaping Use: Never used  Substance Use Topics  . Alcohol use: Yes    Comment: 1-2 drinks per month  . Drug use: Never    Home Medications Prior to Admission medications   Medication Sig Start Date End Date Taking? Authorizing Provider  ALPRAZolam (XANAX) 0.25 MG tablet Take 1 tablet (0.25 mg total) by mouth 2 (two) times daily as needed for anxiety. 10/30/19   [name], MD  aluminum-magnesium hydroxide-simethicone (MAALOX) 200-200-20 MG/5ML SUSP Swish and swallow 5 ml four times daily as needed for mouth sores. 09/21/19   [name], MD  DOCEtaxel (TAXOTERE IV) Inject into the vein.    [provider]  escitalopram (LEXAPRO) 10 MG tablet Take 1 tablet (10 mg total) by mouth daily. 12/25/19   Jacquelin Hawking, NP  Gemcitabine HCl (GEMZAR IV) Inject into the vein.    [provider]  HYDROcodone-homatropine (HYCODAN) 5-1.5 MG/5ML syrup Take 10 mLs by mouth every 6 (six) hours as needed for cough. SMARTSIG:1 Teaspoon By Mouth Every 6 Hours PRN 01/30/20   [name], MD  lidocaine (XYLOCAINE) 2 % solution  09/21/19   [provider]  megestrol (MEGACE) 400 MG/10ML suspension Take 10 mLs (400 mg total) by mouth 2 (two) times daily. 01/31/20   [name], MD  Oxycodone HCl 10 MG TABS Take 0.5  tablets (5 mg total) by mouth every 8 (eight) hours as needed. 01/03/20   Tanner, Lyndon Code., PA-C  prochlorperazine (COMPAZINE) 10 MG tablet TAKE (1) TABLET BY MOUTH EVERY (6) HOURS AS NEEDED. 01/02/20   Benay Pike, MD  zolpidem (AMBIEN) 10 MG tablet Take 1 tablet (10 mg total) by mouth at bedtime as needed for sleep. 01/31/20 03/01/20  [name], MD    Allergies    Patient has no known allergies.  Review of Systems   Review of Systems  Unable to perform ROS: Acuity of condition    Physical Exam Updated Vital Signs BP 129/84 (BP Location: Right Arm)   Pulse 96   Temp 98.3 F (36.8 C) (Oral)   Resp  18   SpO2 96%   Physical Exam Vitals and nursing note reviewed.  Constitutional:      Appearance: He is well-developed and well-nourished. He is obese.  HENT:     Head: Normocephalic and atraumatic.     Right Ear: External ear normal.     Left Ear: External ear normal.     Nose: Nose normal.  Eyes:     General:        Right eye: No discharge.        Left eye: No discharge.  Cardiovascular:     Rate and Rhythm: Normal rate and regular rhythm.     Heart sounds: Normal heart sounds.  Pulmonary:     Effort: Pulmonary effort is normal. No tachypnea or accessory muscle usage.     Breath sounds: Examination of the left-upper field reveals decreased breath sounds. Examination of the left-middle field reveals decreased breath sounds. Examination of the left-lower field reveals decreased breath sounds. Decreased breath sounds present.  Abdominal:     Palpations: Abdomen is soft.     Tenderness: There is no abdominal tenderness.  Musculoskeletal:        General: No edema.     Cervical back: Neck supple.  Skin:    General: Skin is warm and dry.  Neurological:     Mental Status: He is alert.  Psychiatric:        Mood and Affect: Mood is not anxious.     ED Results / Procedures / Treatments   Labs (all labs ordered are listed, but only abnormal results are displayed) Labs Reviewed  COMPREHENSIVE METABOLIC PANEL  CBC WITH DIFFERENTIAL/PLATELET    EKG None  Radiology CT CHEST ABDOMEN PELVIS W CONTRAST  Result Date: 02/06/2020 CLINICAL DATA:  60 year old male with history of liposarcoma in the left lower extremity. EXAM: CT CHEST, ABDOMEN, AND PELVIS WITH CONTRAST TECHNIQUE: Multidetector CT imaging of the chest, abdomen and pelvis was performed following the standard protocol during bolus administration of intravenous contrast. CONTRAST:  125mL OMNIPAQUE IOHEXOL 300 MG/ML  SOLN COMPARISON:  CT the chest, abdomen and pelvis 11/01/2019. FINDINGS: CT CHEST FINDINGS Cardiovascular:  Heart size is normal. Trace amount of pericardial fluid and/or thickening, unlikely to be of hemodynamic significance at this time. No associated pericardial calcification. There is aortic atherosclerosis, as well as atherosclerosis of the great vessels of the mediastinum and the coronary arteries, including calcified atherosclerotic plaque in the left main, left anterior descending and right coronary arteries.Right internal jugular single-lumen porta cath with tip terminating at the superior cavoatrial junction. Mediastinum/Nodes: No pathologically enlarged mediastinal or hilar lymph nodes. Esophagus is unremarkable in appearance. No axillary lymphadenopathy. Lungs/Pleura: Multiple large pulmonary nodules and masses are again noted in the lungs bilaterally, generally increased in size  compared to the prior examination. The largest of these is in the left upper lobe near the apex (axial image 17 of series 2) measuring 5.5 x 5.6 cm, significantly increased from 2.1 x 1.8 cm on the prior examination. This lesion is centrally low-attenuation, suggesting internal areas of necrosis. Small bilateral pleural effusions. New large left pneumothorax with considerable atelectasis in the left lung. Musculoskeletal: There are no aggressive appearing lytic or blastic lesions noted in the visualized portions of the skeleton. CT ABDOMEN PELVIS FINDINGS Hepatobiliary: No suspicious cystic or solid hepatic lesions. No intra or extrahepatic biliary ductal dilatation. Status post cholecystectomy. Pancreas: No pancreatic mass. No pancreatic ductal dilatation. No pancreatic or peripancreatic fluid collections or inflammatory changes. Spleen: Unremarkable. Adrenals/Urinary Tract: In the upper pole the left kidney there is enhancing lesion measuring 1.7 cm in diameter, increased in size compared to the prior study (previously 1.2 cm), concerning for neoplasm. Exophytic 1.2 cm low-attenuation lesion in the lower pole the left kidney,  compatible with a small simple cyst. Right kidney and bilateral adrenal glands are normal in appearance. No hydroureteronephrosis. Urinary bladder is normal in appearance. Stomach/Bowel: The appearance of the stomach is normal. There is no pathologic dilatation of small bowel or colon. Normal appendix. Vascular/Lymphatic: Aortic atherosclerosis, without evidence of aneurysm or dissection in the abdominal or pelvic vasculature. No lymphadenopathy noted in the abdomen or pelvis. Reproductive: Prostate gland and seminal vesicles are unremarkable in appearance. Other: No significant volume of ascites.  No pneumoperitoneum. Musculoskeletal: There are no aggressive appearing lytic or blastic lesions noted in the visualized portions of the skeleton. IMPRESSION: 1. New large left hydropneumothorax. 2. Widespread metastatic disease to the lungs appears progressive compared to the prior study, as above. 3. Enlarging probably enhancing lesion in the upper pole the left kidney concerning for neoplasm, either metastatic or primary. 4. Small right pleural effusion. 5. Aortic atherosclerosis, in addition to left main and 2 vessel coronary artery disease. Please note that although the presence of coronary artery calcium documents the presence of coronary artery disease, the severity of this disease and any potential stenosis cannot be assessed on this non-gated CT examination. Assessment for potential risk factor modification, dietary therapy or pharmacologic therapy may be warranted, if clinically indicated. 6. Additional incidental findings, as above. Critical Value/emergent results were called by telephone at the time of interpretation on 02/06/2020 at 11:35 am to provider [facility] , who verbally acknowledged these results. Electronically Signed   By: Vinnie Langton M.D.   On: 02/06/2020 11:36    Procedures .Critical Care Performed by: Sherwood Gambler, MD Authorized by: Sherwood Gambler, MD   Critical care  provider statement:    Critical care time (minutes):  45   Critical care time was exclusive of:  Separately billable procedures and treating other patients   Critical care was necessary to treat or prevent imminent or life-threatening deterioration of the following conditions:  Respiratory failure and shock   Critical care was time spent personally by me on the following activities:  Discussions with consultants, evaluation of patient's response to treatment, examination of patient, ordering and performing treatments and interventions, ordering and review of laboratory studies, ordering and review of radiographic studies, pulse oximetry, re-evaluation of patient's condition, obtaining history from patient or surrogate and review of old charts CHEST TUBE INSERTION  Date/Time: 02/07/2020 3:11 PM Performed by: Sherwood Gambler, MD Authorized by: Sherwood Gambler, MD   Consent:    Consent obtained:  Verbal and written   Consent given by:  Patient   Risks, benefits, and alternatives were discussed: yes     Risks discussed:  Damage to surrounding structures, bleeding, incomplete drainage, infection, pain and nerve damage Universal protocol:    Patient identity confirmed:  Verbally with patient Pre-procedure details:    Skin preparation:  Chlorhexidine   Preparation: Patient was prepped and draped in the usual sterile fashion   Anesthesia:    Anesthesia method:  Local infiltration   Local anesthetic:  Lidocaine 2% WITH epi Procedure details:    Placement location:  L lateral   Tube size (Fr):  Minicatheter   Ultrasound guidance: no     Tension pneumothorax: no     Tube connected to:  Water seal   Drainage characteristics:  Air only   Suture material:  0 silk   Dressing:  4x4 sterile gauze Post-procedure details:    Post-insertion x-ray findings: tube in good position     Procedure completion:  Tolerated well, no immediate complications     Medications Ordered in ED Medications   lidocaine-EPINEPHrine (XYLOCAINE W/EPI) 2 %-1:200000 (PF) injection 20 mL (has no administration in time range)  sodium chloride flush (NS) 0.9 % injection 10 mL (has no administration in time range)    ED Course  I have reviewed the triage vital signs and the nursing notes.  Pertinent labs & imaging results that were available during my care of the patient were reviewed by me and considered in my medical decision making (see chart for details).    MDM Rules/Calculators/A&P                          Patient presents after CT shows large left pneumothorax.  He is not in distress, mildly low oxygen in the low 90s.  After risks/benefits were discussed, chest tube was placed by myself.  Lung is now much better appearing.  I discussed with Dr. Melvyn Novas, who will consult to help manage.  Will need hospitalist admission after labs come back.  Final Clinical Impression(s) / ED Diagnoses Final diagnoses:  Pneumothorax on left    Rx / DC Orders ED Discharge Orders    None       Sherwood Gambler, MD 02/07/20 1525

## 2020-02-07 NOTE — Progress Notes (Signed)
Patient was assessed by Dr. Delton Coombes and labs have been reviewed. No treatment today. Dr. Raliegh Ip is discontinuing chemo. [facility] labs to be drawn today. Primary RN and pharmacy aware.

## 2020-02-07 NOTE — Progress Notes (Signed)
[name] [address] [place] [address]   CLINIC:  Medical Oncology/Hematology  PCP:  Celene Squibb, MD [address] [name] [address] [phone]   REASON FOR VISIT:  Follow-up for metastatic dedifferentiated liposarcoma  PRIOR THERAPY:  1. Doxorubicin x 3 cycles from 05/22/2019 to 07/04/2019. 2. SBRT to right lung 50 Gy in 10 fractions from 11/14/2019 to 11/29/2019.  NGS Results: Pending  CURRENT THERAPY: Gemcitabine on day 1 & 8 with docetaxel on day 8 every 4 weeks  BRIEF ONCOLOGIC HISTORY:  Oncology History  Liposarcoma of left lower extremity ([place])  04/25/2019 Initial Diagnosis   Liposarcoma of left lower extremity ([place])   05/22/2019 - 07/04/2019 Chemotherapy   The patient had DOXOrubicin (ADRIAMYCIN) chemo injection 174 mg, 75 mg/m2 = 174 mg, Intravenous,  Once, 3 of 6 cycles Administration: 174 mg (05/22/2019), 174 mg (06/13/2019), 174 mg (07/04/2019) palonosetron (ALOXI) injection 0.25 mg, 0.25 mg, Intravenous,  Once, 3 of 6 cycles Administration: 0.25 mg (05/22/2019), 0.25 mg (06/13/2019), 0.25 mg (07/04/2019) fosaprepitant (EMEND) 150 mg in sodium chloride 0.9 % 145 mL IVPB, 150 mg, Intravenous,  Once, 3 of 6 cycles Administration: 150 mg (05/22/2019), 150 mg (06/13/2019), 150 mg (07/04/2019)  for chemotherapy treatment.    08/11/2019 -  Chemotherapy    Patient is on Treatment Plan: LIPOSARCOMA GEMCITABINE D1,8 / DOCETAXEL D8 Q21D      Liposarcoma ([place])  05/15/2019 Initial Diagnosis   Liposarcoma ([place])   05/15/2019 Cancer Staging   Staging form: Primary Cutaneous B-Cell/T-Cell Lymphoma (Non-MF/SS Lymphoma), AJCC 8th Edition - Clinical stage from 05/15/2019: Leonard [name], pM1 - Signed by [name], MD on 05/15/2019     CANCER STAGING: Cancer Staging Liposarcoma [facility]) Staging form: Primary Cutaneous B-Cell/T-Cell Lymphoma (Non-MF/SS Lymphoma), AJCC 8th Edition - Clinical stage from 05/15/2019: Leonard [name], pM1 - Signed by  [name], MD on 05/15/2019  Liposarcoma of left lower extremity ([place]) Staging form: Primary Cutaneous B-Cell/T-Cell Lymphoma (Non-MF/SS Lymphoma), AJCC 8th Edition - Clinical: No stage assigned - Unsigned   INTERVAL HISTORY:  [name], a 60 y.o. male, returns for routine follow-up and consideration for next cycle of chemotherapy. Leonard [name] was last seen on 01/31/2020.  Due for cycle #8 of gemcitabine and Aloxi today.   Today he is accompanied by his wife. Overall, he tells me he has been feeling poorly. He reports worsening in his SOB, cough and wheezing, though he was able to walk up slowly to the cancer center from the parking lot. He also has gait issues and is using a cane to ambulate. He continues having intermittent pain in his left flank and in his epigastric region, though the left flank pain is worse; he denies any relation to food or BM's. He denies CP or pleurisy. He is drinking 2-3 cans of Boost daily. The Hycodan is helping with his coughing. He denies hematuria.  He will stop chemo due to progression of his cancer.   REVIEW OF SYSTEMS:  Review of Systems  Constitutional: Positive for appetite change (depleted) and fatigue (depleted).  Respiratory: Positive for cough (worsening), shortness of breath (worsening) and wheezing (worsening).   Cardiovascular: Negative for chest pain.  Gastrointestinal: Positive for constipation, diarrhea and nausea.  Genitourinary: Negative for hematuria.   Musculoskeletal: Positive for gait problem.  Neurological: Positive for gait problem and light-headedness.  All other systems reviewed and are negative.   PAST MEDICAL/SURGICAL HISTORY:  Past Medical History:  Diagnosis Date  . Cancer (  [place])   . Port-A-Cath in place 05/19/2019   right   Past Surgical History:  Procedure Laterality Date  . CHOLECYSTECTOMY    . ESOPHAGEAL DILATION N/A 06/27/2017   Procedure: ESOPHAGEAL DILATION;  Surgeon: Danie Binder, MD;  Location: [facility]  ENDO SUITE;  Service: Endoscopy;  Laterality: N/A;  . ESOPHAGOGASTRODUODENOSCOPY N/A 06/27/2017   Procedure: ESOPHAGOGASTRODUODENOSCOPY (EGD);  Surgeon: Danie Binder, MD;  Location: [facility] ENDO SU[facility];  Service: Endoscopy;  Laterality: N/A;  . IR IMAGING GUIDED PORT INSERTION  05/04/2019  . knee sx      SOCIAL HISTORY:  Social History   Socioeconomic History  . Marital status: Married    Spouse name: Not on file  . Number of children: 1  . Years of education: Not on file  . Highest education level: Not on file  Occupational History  . Occupation: Unemployed  Tobacco Use  . Smoking status: Former Research scientist (life sciences)  . Smokeless tobacco: Never Used  Vaping Use  . Vaping Use: Never used  Substance and Sexual Activity  . Alcohol use: Yes    Comment: 1-2 drinks per month  . Drug use: Never  . Sexual activity: Not Currently  Other Topics Concern  . Not on file  Social History Narrative   DOES CONSTRUCTION. MARRIED. RARE ETOH. NO TOBACCO.   Social Determinants of Health   Financial Resource Strain: Medium Risk  . Difficulty of Paying Living Expenses: Somewhat hard  Food Insecurity: No Food Insecurity  . Worried About Charity fundraiser in the Last Year: Never true  . Ran Out of Food in the Last Year: Never true  Transportation Needs: No Transportation Needs  . Lack of Transportation (Medical): No  . Lack of Transportation (Non-Medical): No  Physical Activity: Inactive  . Days of Exercise per Week: 0 days  . Minutes of Exercise per Session: 0 min  Stress: Stress Concern Present  . Feeling of Stress : To some extent  Social Connections: Moderately Integrated  . Frequency of Communication with Friends and Family: More than three times a week  . Frequency of Social Gatherings with Friends and Family: Once a week  . Attends Religious Services: More than 4 times per year  . Active Member of Clubs or Organizations: No  . Attends Archivist Meetings: Never  . Marital Status: Married   Human resources officer Violence: Not At Risk  . Fear of Current or Ex-Partner: No  . Emotionally Abused: No  . Physically Abused: No  . Sexually Abused: No    FAMILY HISTORY:  Family History  Problem Relation Age of Onset  . Heart disease Father   . Cancer Maternal Aunt   . Cancer Maternal Uncle   . Cancer Paternal Uncle   . Vision loss Maternal Grandfather   . Colon cancer Neg Hx   . Colon polyps Neg Hx     CURRENT MEDICATIONS:  Current Outpatient Medications  Medication Sig Dispense Refill  . ALPRAZolam (XANAX) 0.25 MG tablet Take 1 tablet (0.25 mg total) by mouth 2 (two) times daily as needed for anxiety. 60 tablet 3  . aluminum-magnesium hydroxide-simethicone (MAALOX) [id number] MG/5ML SUSP Swish and swallow 5 ml four times daily as needed for mouth sores. 600 mL 2  . DOCEtaxel (TAXOTERE IV) Inject into the vein.    Marland Kitchen escitalopram (LEXAPRO) 10 MG tablet Take 1 tablet (10 mg total) by mouth daily. 30 tablet 2  . Gemcitabine HCl (GEMZAR IV) Inject into the vein.    Marland Kitchen HYDROcodone-homatropine (  HYCODAN) 5-1.5 MG/5ML syrup Take 10 mLs by mouth every 6 (six) hours as needed for cough. SMARTSIG:1 Teaspoon By Mouth Every 6 Hours PRN 480 mL 0  . lidocaine (XYLOCAINE) 2 % solution     . megestrol (MEGACE) 400 MG/10ML suspension Take 10 mLs (400 mg total) by mouth 2 (two) times daily. 480 mL 1  . Oxycodone HCl 10 MG TABS Take 0.5 tablets (5 mg total) by mouth every 8 (eight) hours as needed. 30 tablet 0  . prochlorperazine (COMPAZINE) 10 MG tablet TAKE (1) TABLET BY MOUTH EVERY (6) HOURS AS NEEDED. 30 tablet 0  . zolpidem (AMBIEN) 10 MG tablet Take 1 tablet (10 mg total) by mouth at bedtime as needed for sleep. 10 tablet 0   No current facility-administered medications for this visit.   Facility-Administered Medications Ordered in Other Visits  Medication Dose Route Frequency Provider Last Rate Last Admin  . sodium chloride flush (NS) 0.9 % injection 10 mL  10 mL Intravenous PRN [name], MD   10 mL at 08/21/19 1508    ALLERGIES:  No Known Allergies  PHYSICAL EXAM:  Performance status (ECOG): 1 - Symptomatic but completely ambulatory  Vitals:   02/07/20 0858  BP: 120/72  Pulse: (!) 105  Resp: 20  Temp: (!) 96.6 F (35.9 C)  SpO2: 95%   Wt Readings from Last 3 Encounters:  02/07/20 205 lb 14.6 oz (93.4 kg)  01/31/20 207 lb 12.8 oz (94.3 kg)  01/24/20 207 lb 1.6 oz (93.9 kg)   Physical Exam Vitals reviewed.  Constitutional:      Appearance: Normal appearance.  Cardiovascular:     Rate and Rhythm: Normal rate and regular rhythm.     Pulses: Normal pulses.     Heart sounds: Normal heart sounds.  Pulmonary:     Effort: Pulmonary effort is normal.     Breath sounds: Normal breath sounds.  Chest:     Comments: Port-a-Cath in R chest Abdominal:     Palpations: Abdomen is soft. There is no mass.     Tenderness: There is no abdominal tenderness.     Hernia: No hernia is present.  Neurological:     General: No focal deficit present.     Mental Status: He is alert and oriented to person, place, and time.  Psychiatric:        Mood and Affect: Mood normal.        Behavior: Behavior normal.     LABORATORY DATA:  I have reviewed the labs as listed.  CBC Latest Ref Rng & Units 02/07/2020 01/31/2020 01/24/2020  WBC 4.0 - 10.5 K/uL 8.0 11.0(H) 4.6  Hemoglobin 13.0 - 17.0 g/dL 12.3(L) 12.8(L) 12.6(L)  Hematocrit 39.0 - 52.0 % 38.3(L) 40.1 39.5  Platelets 150 - 400 K/uL 247 173 323   CMP Latest Ref Rng & Units 02/07/2020 01/31/2020 01/24/2020  Glucose 70 - 99 mg/dL 128(H) 139(H) 121(H)  BUN 6 - 20 mg/dL 12 10 11   Creatinine 0.61 - 1.24 mg/dL 0.67 0.83 0.78  Sodium 135 - 145 mmol/L 136 136 137  Potassium 3.5 - 5.1 mmol/L 3.6 3.6 3.4(L)  Chloride 98 - 111 mmol/L 102 101 103  CO2 22 - 32 mmol/L 25 25 25   Calcium 8.9 - 10.3 mg/dL 8.5(L) 8.8(L) 8.6(L)  Total Protein 6.5 - 8.1 g/dL 6.3(L) 6.7 6.5  Total Bilirubin 0.3 - 1.2 mg/dL 0.9 0.7 0.7  Alkaline Phos  38 - 126 U/L 142(H) 169(H) 114  AST 15 - 41  U/L 29 38 36  ALT 0 - 44 U/L 22 33 29    DIAGNOSTIC IMAGING:  I have independently reviewed the scans and discussed with the patient. CT CHEST ABDOMEN PELVIS W CONTRAST  Result Date: 02/06/2020 CLINICAL DATA:  60 year old male with history of liposarcoma in the left lower extremity. EXAM: CT CHEST, ABDOMEN, AND PELVIS WITH CONTRAST TECHNIQUE: Multidetector CT imaging of the chest, abdomen and pelvis was performed following the standard protocol during bolus administration of intravenous contrast. CONTRAST:  172mL OMNIPAQUE IOHEXOL 300 MG/ML  SOLN COMPARISON:  CT the chest, abdomen and pelvis 11/01/2019. FINDINGS: CT CHEST FINDINGS Cardiovascular: Heart size is normal. Trace amount of pericardial fluid and/or thickening, unlikely to be of hemodynamic significance at this time. No associated pericardial calcification. There is aortic atherosclerosis, as well as atherosclerosis of the great vessels of the mediastinum and the coronary arteries, including calcified atherosclerotic plaque in the left main, left anterior descending and right coronary arteries.Right internal jugular single-lumen porta cath with tip terminating at the superior cavoatrial junction. Mediastinum/Nodes: No pathologically enlarged mediastinal or hilar lymph nodes. Esophagus is unremarkable in appearance. No axillary lymphadenopathy. Lungs/Pleura: Multiple large pulmonary nodules and masses are again noted in the lungs bilaterally, generally increased in size compared to the prior examination. The largest of these is in the left upper lobe near the apex (axial image 17 of series 2) measuring 5.5 x 5.6 cm, significantly increased from 2.1 x 1.8 cm on the prior examination. This lesion is centrally low-attenuation, suggesting internal areas of necrosis. Small bilateral pleural effusions. New large left pneumothorax with considerable atelectasis in the left lung. Musculoskeletal: There are no  aggressive appearing lytic or blastic lesions noted in the visualized portions of the skeleton. CT ABDOMEN PELVIS FINDINGS Hepatobiliary: No suspicious cystic or solid hepatic lesions. No intra or extrahepatic biliary ductal dilatation. Status post cholecystectomy. Pancreas: No pancreatic mass. No pancreatic ductal dilatation. No pancreatic or peripancreatic fluid collections or inflammatory changes. Spleen: Unremarkable. Adrenals/Urinary Tract: In the upper pole the left kidney there is enhancing lesion measuring 1.7 cm in diameter, increased in size compared to the prior study (previously 1.2 cm), concerning for neoplasm. Exophytic 1.2 cm low-attenuation lesion in the lower pole the left kidney, compatible with a small simple cyst. Right kidney and bilateral adrenal glands are normal in appearance. No hydroureteronephrosis. Urinary bladder is normal in appearance. Stomach/Bowel: The appearance of the stomach is normal. There is no pathologic dilatation of small bowel or colon. Normal appendix. Vascular/Lymphatic: Aortic atherosclerosis, without evidence of aneurysm or dissection in the abdominal or pelvic vasculature. No lymphadenopathy noted in the abdomen or pelvis. Reproductive: Prostate gland and seminal vesicles are unremarkable in appearance. Other: No significant volume of ascites.  No pneumoperitoneum. Musculoskeletal: There are no aggressive appearing lytic or blastic lesions noted in the visualized portions of the skeleton. IMPRESSION: 1. New large left hydropneumothorax. 2. Widespread metastatic disease to the lungs appears progressive compared to the prior study, as above. 3. Enlarging probably enhancing lesion in the upper pole the left kidney concerning for neoplasm, either metastatic or primary. 4. Small right pleural effusion. 5. Aortic atherosclerosis, in addition to left main and 2 vessel coronary artery disease. Please note that although the presence of coronary artery calcium documents the  presence of coronary artery disease, the severity of this disease and any potential stenosis cannot be assessed on this non-gated CT examination. Assessment for potential risk factor modification, dietary therapy or pharmacologic therapy may be warranted, if clinically indicated. 6. Additional  incidental findings, as above. Critical Value/emergent results were called by telephone at the time of interpretation on 02/06/2020 at 11:35 am to provider [facility] , who verbally acknowledged these results. Electronically Signed   By: Vinnie Langton M.D.   On: 02/06/2020 11:36     ASSESSMENT:  1. 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 -Chemoradiation therapy with gemcitabine and docetaxel followed by wide local excision. -Pathology on 11/10/2018 showed liposarcoma with 80% necrosis, grade 2, ypT4, NX, positive deep margin, amplification of MDM 2 detected. Amplification is associated with well-differentiated liposarcoma/atypical lipomatous tumors and dedifferentiated liposarcoma. -CT CAP in the ER on 04/24/2019 showed widespread pulmonary metastatic lesions throughout both lungs, largest left upper lobe mass measuring 4.7 x 4.2 cm. Mass in the inferior lingula measures 4.12 x 3.2 cm. -Bone scan on 05/02/2019 did not show any evidence of metastatic disease. -2D echo on 05/02/2019 shows EF 60 to 65%. -Adriamycin every 3 weeks started on 05/22/2019. -CT CAP on 07/19/2019 showed dominant lung lesions have decreased in size. 2 nodules in the right major fissure have progressed while some other pulmonary nodules are unchanged. No evidence of metastatic disease in the abdomen or pelvis. -Gemcitabine and docetaxel every 21 days started on 08/11/2019. -CT CAP on 11/01/2019 showed interval improvement in the pulmonary metastatic disease but new 5 mm nodule in the right upper lobe. Right  lower lobe lesion has increased in volume. Lytic focus in the left posterior fourth rib is slightly prominent. Hepatic steatosis. -Right lung IMRT, 5 Gy, 10 fractions.  2. Left kidney lesion: -CT on 04/24/2019 showed mass arising in the periphery of the upper lobe of the left kidney measuring 1.3 x 1.4 cm. -CT scan from [facility] in August 2020 was showing left kidney lesion. -Current scan on 07/19/2019 showed 15 mm hypoattenuating lesion in the upper pole left kidney stable since prior.   PLAN:  1. Metastatic dedifferentiated liposarcoma: -He has completed 7 cycles of chemotherapy with gemcitabine and docetaxel. -I have reviewed CT CAP from 02/06/2020.  There is a larger left hydropneumothorax which is new.  Metastatic disease in the lungs have progressed.  Left kidney lesion has increased in size. -He has progressed over 2 chemotherapy regimens.  I do not believe that further chemotherapy will be of any help. -I have recommended guardant 360 mutation testing.  I will reach out to sarcoma specialist at [facility] and [facility] to see if they have any clinical trials. -I have contacted CT surgery who recommended chest tube placement. -We have recommended him to be evaluated in the ER for chest tube placement.  2. Dry cough: -Continue hydrocodone 10 cc every 6 hours as needed.  3. Anxiety: -Continue Xanax as needed.  4.  Bilateral rib pain: -This is present when he coughs.  5. Weight loss: -Because of his weight loss, we have started him on Megace 400 mg twice daily.  He has reported improvement in appetite.  6. Depression: -Continue Lexapro daily.  7. sleeping difficulty: -Continue Ambien at bedtime.   Orders placed this encounter:  No orders of the defined types were placed in this encounter.    [name], MD [place] [phone]   I, Milinda Antis, am acting as a scribe for Dr. Sanda Linger.  I, [name] MD, have reviewed the above documentation for accuracy and completeness, and I agree with the above.

## 2020-02-08 ENCOUNTER — Other Ambulatory Visit: Payer: Self-pay

## 2020-02-08 ENCOUNTER — Inpatient Hospital Stay (HOSPITAL_COMMUNITY): Payer: 59

## 2020-02-08 ENCOUNTER — Encounter (HOSPITAL_COMMUNITY): Payer: Self-pay | Admitting: Internal Medicine

## 2020-02-08 DIAGNOSIS — Z515 Encounter for palliative care: Secondary | ICD-10-CM

## 2020-02-08 DIAGNOSIS — J939 Pneumothorax, unspecified: Secondary | ICD-10-CM | POA: Diagnosis not present

## 2020-02-08 DIAGNOSIS — Z7189 Other specified counseling: Secondary | ICD-10-CM

## 2020-02-08 DIAGNOSIS — C499 Malignant neoplasm of connective and soft tissue, unspecified: Secondary | ICD-10-CM

## 2020-02-08 LAB — CBC
HCT: 33.9 % — ABNORMAL LOW (ref 39.0–52.0)
Hemoglobin: 10.9 g/dL — ABNORMAL LOW (ref 13.0–17.0)
MCH: 32.2 pg (ref 26.0–34.0)
MCHC: 32.2 g/dL (ref 30.0–36.0)
MCV: 100.3 fL — ABNORMAL HIGH (ref 80.0–100.0)
Platelets: 265 10*3/uL (ref 150–400)
RBC: 3.38 MIL/uL — ABNORMAL LOW (ref 4.22–5.81)
RDW: 18.1 % — ABNORMAL HIGH (ref 11.5–15.5)
WBC: 7.1 10*3/uL (ref 4.0–10.5)
nRBC: 0 % (ref 0.0–0.2)

## 2020-02-08 LAB — HIV ANTIBODY (ROUTINE TESTING W REFLEX): HIV Screen 4th Generation wRfx: NONREACTIVE

## 2020-02-08 MED ORDER — HYDROCOD POLST-CPM POLST ER 10-8 MG/5ML PO SUER
5.0000 mL | Freq: Two times a day (BID) | ORAL | Status: DC
  Administered 2020-02-08 – 2020-02-16 (×16): 5 mL via ORAL
  Filled 2020-02-08 (×16): qty 5

## 2020-02-08 MED ORDER — GABAPENTIN 100 MG PO CAPS
100.0000 mg | ORAL_CAPSULE | Freq: Four times a day (QID) | ORAL | Status: DC
  Administered 2020-02-08 (×2): 100 mg via ORAL
  Filled 2020-02-08 (×3): qty 1

## 2020-02-08 MED ORDER — CHLORHEXIDINE GLUCONATE CLOTH 2 % EX PADS
6.0000 | MEDICATED_PAD | Freq: Every day | CUTANEOUS | Status: DC
  Administered 2020-02-08 – 2020-02-15 (×8): 6 via TOPICAL

## 2020-02-08 MED ORDER — PANTOPRAZOLE SODIUM 40 MG PO TBEC
40.0000 mg | DELAYED_RELEASE_TABLET | Freq: Two times a day (BID) | ORAL | Status: DC
  Administered 2020-02-08 – 2020-02-16 (×16): 40 mg via ORAL
  Filled 2020-02-08 (×16): qty 1

## 2020-02-08 MED ORDER — BENZONATATE 100 MG PO CAPS
200.0000 mg | ORAL_CAPSULE | Freq: Three times a day (TID) | ORAL | Status: DC
  Administered 2020-02-08 – 2020-02-16 (×24): 200 mg via ORAL
  Filled 2020-02-08 (×24): qty 2

## 2020-02-08 MED ORDER — ALBUTEROL SULFATE (2.5 MG/3ML) 0.083% IN NEBU
2.5000 mg | INHALATION_SOLUTION | Freq: Two times a day (BID) | RESPIRATORY_TRACT | Status: DC
  Administered 2020-02-08 – 2020-02-12 (×9): 2.5 mg via RESPIRATORY_TRACT
  Filled 2020-02-08 (×10): qty 3

## 2020-02-08 MED ORDER — HYDROCODONE-HOMATROPINE 5-1.5 MG/5ML PO SYRP: 5.0000 mL | ORAL_SOLUTION | ORAL | Status: DC | PRN

## 2020-02-08 MED ORDER — ALBUTEROL SULFATE (2.5 MG/3ML) 0.083% IN NEBU: 2.5000 mg | INHALATION_SOLUTION | Freq: Four times a day (QID) | RESPIRATORY_TRACT | Status: DC | PRN

## 2020-02-08 MED ORDER — HYDROMORPHONE HCL 1 MG/ML IJ SOLN
1.0000 mg | INTRAMUSCULAR | Status: DC | PRN
  Administered 2020-02-08 – 2020-02-15 (×27): 1 mg via INTRAVENOUS
  Filled 2020-02-08 (×28): qty 1

## 2020-02-08 NOTE — ED Notes (Signed)
REPORT called to [facility]

## 2020-02-08 NOTE — Progress Notes (Addendum)
Patient had his home bottle of Tussinex @ bedside. I removed bottle from patient's room. Explained he can't have home meds with him in his room because we give him his meds from our pharmacy, patient verbalized understanding. Bottle is in patient's drawer in med room since pharmacy is closed. Patient's wife will pick up tomorrow.

## 2020-02-08 NOTE — Consult Note (Signed)
Consultation Note Date: 02/08/2020   Patient Name: Leonard [name]  DOB: [birth year]  MRN: [id number]  Age / Sex: 60 y.o., male  PCP: Celene Squibb, MD Referring Physician: Roxan Hockey, MD  Reason for Consultation: Establishing goals of care  HPI/Patient Profile: 59 y.o. 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 Delton Coombes admitted on 02/07/2020 with difficulty breathing due to hydropneumothorax. Chest tube was inserted in ED.   Clinical Assessment and Goals of Care: I met today at [name]' bedside. No family present. I introduced my role of palliative care for support to patients and families dealing with serious illness to help optimize their quality of life within the context of their disease. [name] is tearful and shares that his wife was just diagnosed with cancer yesterday. He was told at his appointment yesterday that there is likely not much else that can be done for his cancer (although Dr. Delton Coombes reaching out to [facility] and [facility] to see if they have any trials). After his appointment they went to wife's appointment and shortly after returning home he was called and told to come to the hospital. They have not had much time to process all this new information.   [name] shares with me that he is aware of his poor prognosis. He and his wife have had open communication regarding this. We discussed what this conversation has been regarding his wishes. [name] shares that he has been working on a will and he has a good idea of what he wants to leave to whom. I asked him about his wishes regarding his healthcare. He has not thought too much about this but does express desire for DNR but wants to discuss this with his wife first. He also shares that he had a brief encounter with hospice for another family member and I did shares with  him that he could even have assistance at home where hospice could help him at home if there are no further treatment options that are good for him. He is unsure if he is ready for this step and wants to follow up with oncology to determine other options first. I reassure him that this is a good plan but I just wanted to make him aware that this could be an option to make things easier for him at home at some point.   All questions/concerns addressed. Emotional support provided.   Primary Decision Maker PATIENT    SUMMARY OF RECOMMENDATIONS   - Ongoing goals of care conversations - He plans to discuss with his wife his wishes for no resuscitation and life support - Introduced the idea of home hospice but not yet ready for this - Would benefit from outpatient palliative  Code Status/Advance Care Planning:  Full code - discussing with family   Symptom Management:   SOB is much improved with chest tube.   Pain is mostly with cough and movement. No changes to pain medication.   Primary  complaint now is cough. Scheduled benzonatate. Attending has added Tussionex.   Bowel Regimen: Consider adding Senokot scheduled.   Palliative Prophylaxis:   Bowel Regimen, Delirium Protocol and Frequent Pain Assessment   Psycho-social/Spiritual:   Desire for further Chaplaincy support:yes  Additional Recommendations: Education on Hospice and Grief/Bereavement Support  Prognosis:   Overall prognosis poor with progressing cancer. Would be eligible for hospice services in his home if interested.   Discharge Planning: To Be Determined      Primary Diagnoses: Present on Admission: . Pneumothorax on left . Liposarcoma ([place])   I have reviewed the medical record, interviewed the patient and family, and examined the patient. The following aspects are pertinent.  Past Medical History:  Diagnosis Date  . Cancer [facility])    metastatic lung cancer  . Port-A-Cath in place 05/19/2019   right    Social History   Socioeconomic History  . Marital status: Married    Spouse name: Not on file  . Number of children: 1  . Years of education: Not on file  . Highest education level: Not on file  Occupational History  . Occupation: Unemployed  Tobacco Use  . Smoking status: Former Research scientist (life sciences)  . Smokeless tobacco: Never Used  Vaping Use  . Vaping Use: Never used  Substance and Sexual Activity  . Alcohol use: Yes    Comment: 1-2 drinks per month  . Drug use: Never  . Sexual activity: Not Currently  Other Topics Concern  . Not on file  Social History Narrative   DOES CONSTRUCTION. MARRIED. RARE ETOH. NO TOBACCO.   Social Determinants of Health   Financial Resource Strain: Medium Risk  . Difficulty of Paying Living Expenses: Somewhat hard  Food Insecurity: No Food Insecurity  . Worried About Charity fundraiser in the Last Year: Never true  . Ran Out of Food in the Last Year: Never true  Transportation Needs: No Transportation Needs  . Lack of Transportation (Medical): No  . Lack of Transportation (Non-Medical): No  Physical Activity: Inactive  . Days of Exercise per Week: 0 days  . Minutes of Exercise per Session: 0 min  Stress: Stress Concern Present  . Feeling of Stress : To some extent  Social Connections: Moderately Integrated  . Frequency of Communication with Friends and Family: More than three times a week  . Frequency of Social Gatherings with Friends and Family: Once a week  . Attends Religious Services: More than 4 times per year  . Active Member of Clubs or Organizations: No  . Attends Archivist Meetings: Never  . Marital Status: Married   Family History  Problem Relation Age of Onset  . Heart disease Father   . Cancer Maternal Aunt   . Cancer Maternal Uncle   . Cancer Paternal Uncle   . Vision loss Maternal Grandfather   . Colon cancer Neg Hx   . Colon polyps Neg Hx    Scheduled Meds: . albuterol  2.5 mg Nebulization BID  . Chlorhexidine  Gluconate Cloth  6 each Topical Daily  . escitalopram  10 mg Oral Daily  . megestrol  400 mg Oral BID   Continuous Infusions: PRN Meds:.acetaminophen **OR** acetaminophen, albuterol, ALPRAZolam, HYDROcodone-homatropine, HYDROmorphone (DILAUDID) injection, ondansetron **OR** ondansetron (ZOFRAN) IV, oxyCODONE, polyethylene glycol, zolpidem No Known Allergies Review of Systems  Constitutional: Positive for activity change, appetite change and fatigue.  Respiratory: Positive for cough and shortness of breath.   Neurological: Positive for weakness.    Physical Exam Vitals and  nursing note reviewed.  Constitutional:      General: He is not in acute distress.    Appearance: He is ill-appearing.  Cardiovascular:     Rate and Rhythm: Normal rate.  Pulmonary:     Effort: No tachypnea, accessory muscle usage or respiratory distress.     Comments: Dry cough L chest tube in place Abdominal:     Palpations: Abdomen is soft.  Skin:    Coloration: Skin is pale.  Neurological:     Mental Status: He is alert and oriented to person, place, and time.     Vital Signs: BP 112/77 (BP Location: Right Arm)   Pulse 84   Temp 97.8 F (36.6 C)   Resp 16   Ht _0  (1.778 m)   Wt 91.9 kg   SpO2 96%   BMI 29.07 kg/m  Pain Scale: 0-10   Pain Score: 6    SpO2: SpO2: 96 % O2 Device:SpO2: 96 % O2 Flow Rate: .O2 Flow Rate (L/min): 2 L/min  IO: Intake/output summary:   Intake/Output Summary (Last 24 hours) at 02/08/2020 1150 Last data filed at 02/08/2020 1740 Gross per 24 hour  Intake 240 ml  Output -  Net 240 ml    LBM:   Baseline Weight: Weight: 91.9 kg Most recent weight: Weight: 91.9 kg     Palliative Assessment/Data:     Time In: 1150 Time Out: 1300 Time Total: 70 min Greater than 50%  of this time was spent counseling and coordinating care related to the above assessment and plan.  Signed by: Vinie Sill, NP Palliative Medicine Team Pager # [phone] (M-F  8a-5p) Team Phone # [phone] (Nights/Weekends)

## 2020-02-08 NOTE — Progress Notes (Signed)
NAME:  Leonard [name], MRN:  [id number], DOB:  1960-11-16, LOS: 1 ADMISSION DATE:  02/07/2020, CONSULTATION DATE:  1/27 REFERRING MD:  Robbie Lis, CHIEF COMPLAINT:  L PTX    Brief History:  53 yowm remoted smoker with spont PTX s/p pigtail catheter on arrival to ED 1/26 and PCCM consulted   History of Present Illness:   60 y.o. 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  Patient was reffered to the ED after outpatient CT done yesterday showed a large pneumothorax. Worsening of his difficulty breathing and wheezing over the past few days.  He also reported some pain to the left flank.  He denies vomiting, but reports poor oral intake.  Reports intermittent diarrhea.  Patient had CT chest and abdomen with contrast 1/25  that showed hydropneumothorax.  With mild spread metastatic disease to the lungs that appear to have progressed from prior study.  Also an enlarging probably enhancing lesion to the left upper pole of the left kidney concerning for neoplasm metastatic or primary.   Last RT nov 2021, no longer on chemo as of a week PTA as progression on dz, seeking   opinion from tertiary med centers.     ED Course: Stable vitals.  O2 sats 92 to 95% on room air, placed on 2 L nasal cannula.  Stable CBC and CMP. COVID test negative.  Chest tube was successfully placed in the ED.  Pulmonologist Dr. Melvyn Novas was consulted, agreed with hospitalization here, for now.  Past Medical History:   Past Medical History:  Diagnosis Date  . Cancer [facility])    metastatic lung cancer  . Port-A-Cath in place 05/19/2019   right     Significant Hospital Events:     Consults:  PCCM 1/26   Procedures:  L pigtain 1/26        Interim History / Subjective:  No cp or sob p ct placed   Objective   Blood pressure 112/77, pulse 84, temperature 97.8 F (36.6 C), resp. rate 16, height 5' 10" (1.778 m), weight 91.9 kg, SpO2 96 %.        Intake/Output Summary (Last 24 hours) at  02/08/2020 1429 Last data filed at 02/08/2020 8546 Gross per 24 hour  Intake 240 ml  Output --  Net 240 ml   Filed Weights   02/08/20 0924  Weight: 91.9 kg    Examination: Tmax  97.8 General: obese wm harsh dry coughing fits with min bubbling from L Chest tube HENT: clear Lungs: clear bilaterally  Cardiovascular: no s3  Abdomen: soft/ benign Extremities: warm s calf tenderness,  edema Neuro: alert        Assessment & Plan:  1) spontaneous L PTX in pt with met sarcoma to lung with only small air lea >  Continue low wall suction >> control cough with gabapentin/ prn codeine  Logistics discussed with Dr Karlene Einstein > [facility] transfer if needs to keep in CT if bed available though pt prefers to stay here    Labs   CBC: Recent Labs  Lab 02/07/20 0901 02/07/20 1447 02/07/20 2210 02/08/20 0450  WBC 8.0 9.5  --  7.1  NEUTROABS 6.7 8.0*  --   --   HGB 12.3* 11.6* 11.2* 10.9*  HCT 38.3* 36.5* 34.9* 33.9*  MCV 100.0 100.6*  --  100.3*  PLT 247 274  --  270    Basic Metabolic Panel: Recent Labs  Lab 02/07/20 0901 02/07/20 1447  NA 136 136  K 3.6 3.9  CL 102 104  CO2 25 27  GLUCOSE 128* 111*  BUN 12 12  CREATININE 0.67 0.63  CALCIUM 8.5* 8.3*  MG 2.0  --    GFR: Estimated Creatinine Clearance: 111.9 mL/min (by C-G formula based on SCr of 0.63 mg/dL). Recent Labs  Lab 02/07/20 0901 02/07/20 1447 02/08/20 0450  WBC 8.0 9.5 7.1    Liver Function Tests: Recent Labs  Lab 02/07/20 0901 02/07/20 1447  AST 29 27  ALT 22 22  ALKPHOS 142* 145*  BILITOT 0.9 0.6  PROT 6.3* 5.5*  ALBUMIN 3.2* 3.1*   No results for input(s): LIPASE, AMYLASE in the last 168 hours. No results for input(s): AMMONIA in the last 168 hours.  ABG No results found for: PHART, PCO2ART, PO2ART, HCO3, TCO2, ACIDBASEDEF, O2SAT   Coagulation Profile: No results for input(s): INR, PROTIME in the last 168 hours.  Cardiac Enzymes: No results for input(s): CKTOTAL, CKMB, CKMBINDEX,  TROPONINI in the last 168 hours.  HbA1C: Hgb A1c MFr Bld  Date/Time Value Ref Range Status  08/31/2019 10:17 AM 5.3 4.8 - 5.6 % Final    Comment:    (NOTE) Pre diabetes:          5.7%-6.4%  Diabetes:              >6.4%  Glycemic control for   <7.0% adults with diabetes   08/01/2018 12:11 PM 5.5 4.8 - 5.6 % Final    Comment:    (NOTE) Pre diabetes:          5.7%-6.4% Diabetes:              >6.4% Glycemic control for   <7.0% adults with diabetes     CBG: No results for input(s): GLUCAP in the last 168 hours.     Past Medical History:  He,  has a past medical history of Cancer ([place]) and Port-A-Cath in place (05/19/2019).   Surgical History:   Past Surgical History:  Procedure Laterality Date  . CHOLECYSTECTOMY    . ESOPHAGEAL DILATION N/A 06/27/2017   Procedure: ESOPHAGEAL DILATION;  Surgeon: Danie Binder, MD;  Location: [facility] SUITE;  Service: Endoscopy;  Laterality: N/A;  . ESOPHAGOGASTRODUODENOSCOPY N/A 06/27/2017   Procedure: ESOPHAGOGASTRODUODENOSCOPY (EGD);  Surgeon: Danie Binder, MD;  Location: [facility] ENDO SUITE;  Service: Endoscopy;  Laterality: N/A;  . IR IMAGING GUIDED PORT INSERTION  05/04/2019  . knee sx       Social History:   reports that he has quit smoking. He has never used smokeless tobacco. He reports current alcohol use. He reports that he does not use drugs.   Family History:  His family history includes Cancer in his maternal aunt, maternal uncle, and paternal uncle; Heart disease in his father; Vision loss in his maternal grandfather. There is no history of Colon cancer or Colon polyps.   Allergies No Known Allergies   Home Medications  Prior to Admission medications   Medication Sig Start Date End Date Taking? Authorizing Provider  ALPRAZolam (XANAX) 0.25 MG tablet Take 1 tablet (0.25 mg total) by mouth 2 (two) times daily as needed for anxiety. 10/30/19  Yes [name], MD  DOCEtaxel (TAXOTERE IV) Inject into the vein.   Yes  [provider]  escitalopram (LEXAPRO) 10 MG tablet Take 1 tablet (10 mg total) by mouth daily. 12/25/19  Yes Jacquelin Hawking, NP  Gemcitabine HCl (GEMZAR IV) Inject into the vein.   Yes [provider]  HYDROcodone-homatropine (HYCODAN) 5-1.5 MG/5ML syrup Take 10 mLs by mouth every 6 (six) hours as needed for cough. SMARTSIG:1 Teaspoon By Mouth Every 6 Hours PRN Patient taking differently: Take 10 mLs by mouth every 6 (six) hours as needed for cough. 01/30/20  Yes [name], MD  megestrol (MEGACE) 400 MG/10ML suspension Take 10 mLs (400 mg total) by mouth 2 (two) times daily. 01/31/20  Yes [name], MD  Oxycodone HCl 10 MG TABS Take 0.5 tablets (5 mg total) by mouth every 8 (eight) hours as needed. Patient taking differently: Take 5 mg by mouth every 8 (eight) hours as needed (severe pain). 01/03/20  Yes Tanner, Lyndon Code., PA-C  prochlorperazine (COMPAZINE) 10 MG tablet TAKE (1) TABLET BY MOUTH EVERY (6) HOURS AS NEEDED. Patient taking differently: Take 10 mg by mouth every 6 (six) hours as needed for nausea or vomiting. 01/02/20  Yes Iruku, Arletha Pili, MD  zolpidem (AMBIEN) 10 MG tablet Take 1 tablet (10 mg total) by mouth at bedtime as needed for sleep. 01/31/20 03/01/20 Yes [name], MD  aluminum-magnesium hydroxide-simethicone (MAALOX) 200-200-20 MG/5ML SUSP Swish and swallow 5 ml four times daily as needed for mouth sores. 09/21/19   [name], MD      Christinia Gully, MD Pulmonary and [place] [phone]   After 7:00 pm call Elink  [phone]

## 2020-02-08 NOTE — Progress Notes (Signed)
Patient Demographics:    Leonard [name], is a 60 y.o. male, DOB - 12/22/1960, ELF:[id number]  Admit date - 02/07/2020   Admitting Physician Ejiroghene Arlyce Dice, MD  Outpatient Primary MD for the patient is Celene Squibb, MD  LOS - 1   Chief Complaint  Patient presents with  . 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, kidney and ribs admitted on 02/07/2020 with left-sided hydropneumothorax requiring chest tube placement -- Pt needs a bed [facility]--- ----has Lt sided chest tube  And PCCM  is not available over the weekend to manage chest tube----so pt will go to [facility] with PCCM consult   A/p 1)Large Lt Hydropneumothorax--in the setting of metastatic liposarcoma -Status post chest tube placement on 02/07/2020 -LUL mass which may be Necrotic and  so may not close on its own --has Lt sided chest tube (PCCM) is not available over the weekend to manage chest tube----so pt will go to [facility] with PCCM consult --Pulmonary consult from Dr. Melvyn Novas appreciated  2) liposarcoma of left lower extremity--- metastatic to both lungs, kidney and ribs -Left calf mass biopsy on 08/09/2018 consistent with grade 2 liposarcoma -Oncologist Dr. Delton Coombes recommends no further chemotherapy due to progression of disease despite chemoradiation therapy and wide local excision ---Palliative care consult appreciated  3) bilateral rib pain----Lytic focus in the left  posterior fourth rib is slightly prominent, suspect related to metastatic lung and rib disease -Continue as needed opiates  4) depression and anxiety----related to above, anxiety is worse now that patient's wife has also been diagnosed with a malignancy -As needed Xanax and scheduled Lexapro  5) social/ethics--care consult appreciated patient leaning towards DNR/DNI status, remains full scope of treatment however per oncologist he is not a candidate for further chemotherapy  Disposition/Need for in-Hospital Stay- patient unable to be discharged at this time due to left-sided hydropneumothorax requiring chest tube placement, pain control and ongoing pulmonology management of his chest tube*  Status is: Inpatient  Remains inpatient appropriate because:See above   Disposition: The patient is from: Home              Anticipated d/c is to: Home              Anticipated d/c date is: > 3 days              Patient currently is not medically stable to d/c. Barriers: Not Clinically Stable-   Code Status :  -  Code Status: Full Code   Family Communication:    NA (patient is alert, awake and coherent)   Consults  :  Pulm/Palliative Care  DVT Prophylaxis  :   - SCDs  SCDs Start: 02/07/20 2155    Lab Results  Component Value Date   PLT  265 02/08/2020    Inpatient Medications  Scheduled Meds: . albuterol  2.5 mg Nebulization BID  . benzonatate  200 mg Oral TID  . Chlorhexidine Gluconate Cloth  6 each Topical Daily  . escitalopram  10 mg Oral Daily  . megestrol  400 mg Oral BID   Continuous Infusions: PRN Meds:.acetaminophen **OR** acetaminophen, albuterol, ALPRAZolam, HYDROcodone-homatropine, HYDROmorphone (DILAUDID) injection, ondansetron **OR** ondansetron (ZOFRAN) IV, oxyCODONE, polyethylene glycol, zolpidem    Anti-infectives (From admission, onward)   None        Objective:   Vitals:   02/08/20 0730 02/08/20 0922 02/08/20 0924 02/08/20 1134  BP:  112/77    Pulse: 77  84    Resp: 12 16    Temp:  97.8 F (36.6 C)    TempSrc:      SpO2: 91% 97%  96%  Weight:   91.9 kg   Height:   5\' 10"  (1.778 m)     Wt Readings from Last 3 Encounters:  02/08/20 91.9 kg  02/07/20 93.4 kg  01/31/20 94.3 kg     Intake/Output Summary (Last 24 hours) at 02/08/2020 1419 Last data filed at 02/08/2020 0929 Gross per 24 hour  Intake 240 ml  Output -  Net 240 ml   Physical Exam  Gen:- Awake Alert,  In no apparent distress  HEENT:- [place].AT, No sclera icterus Neck-Supple Neck,No JVD,.  Lungs-left-sided chest tube in situ, air movement is fair symmetrical CV- S1, S2 normal, regular, right-sided Port-A-Cath in situ Abd-  +ve B.Sounds, Abd Soft, No tenderness,    Extremity/Skin:- No  edema, pedal pulses present  Psych-affect is anxious, oriented x3 Neuro-generalized weakness no new focal deficits, no tremors   Data Review:   Micro Results Recent Results (from the past 240 hour(s))  SARS Coronavirus 2 by RT PCR (hospital order, performed in [facility] hospital lab) Nasopharyngeal Nasopharyngeal Swab     Status: None   Collection Time: 02/07/20  2:31 PM   Specimen: Nasopharyngeal Swab  Result Value Ref Range Status   SARS Coronavirus 2 NEGATIVE NEGATIVE Final    Comment: (NOTE) SARS-CoV-2 target nucleic acids are NOT DETECTED.  The SARS-CoV-2 RNA is generally detectable in upper and lower respiratory specimens during the acute phase of infection. The lowest concentration of SARS-CoV-2 viral copies this assay can detect is 250 copies / mL. A negative result does not preclude SARS-CoV-2 infection and should not be used as the sole basis for treatment or other patient management decisions.  A negative result may occur with improper specimen collection / handling, submission of specimen other than nasopharyngeal swab, presence of viral mutation(s) within the areas targeted by this assay, and inadequate number of viral copies (<250 copies / mL). A negative result  must be combined with clinical observations, patient history, and epidemiological information.  Fact Sheet for Patients:   StrictlyIdeas.no  Fact Sheet for Healthcare Providers: BankingDealers.co.za  This test is not yet approved or  cleared by the [place] FDA and has been authorized for detection and/or diagnosis of SARS-CoV-2 by FDA under an Emergency Use Authorization (EUA).  This EUA will remain in effect (meaning this test can be used) for the duration of the COVID-19 declaration under Section 564(b)(1) of the Act, 21 U.S.C. section 360bbb-3(b)(1), unless the authorization is terminated or revoked sooner.  Performed at [facility], [address] [place], [place] [address]     Radiology Reports CT CHEST ABDOMEN PELVIS W CONTRAST  Result Date: 02/06/2020 CLINICAL DATA:  60 year old male  with history of liposarcoma in the left lower extremity. EXAM: CT CHEST, ABDOMEN, AND PELVIS WITH CONTRAST TECHNIQUE: Multidetector CT imaging of the chest, abdomen and pelvis was performed following the standard protocol during bolus administration of intravenous contrast. CONTRAST:  171mL OMNIPAQUE IOHEXOL 300 MG/ML  SOLN COMPARISON:  CT the chest, abdomen and pelvis 11/01/2019. FINDINGS: CT CHEST FINDINGS Cardiovascular: Heart size is normal. Trace amount of pericardial fluid and/or thickening, unlikely to be of hemodynamic significance at this time. No associated pericardial calcification. There is aortic atherosclerosis, as well as atherosclerosis of the great vessels of the mediastinum and the coronary arteries, including calcified atherosclerotic plaque in the left main, left anterior descending and right coronary arteries.Right internal jugular single-lumen porta cath with tip terminating at the superior cavoatrial junction. Mediastinum/Nodes: No pathologically enlarged mediastinal or hilar lymph nodes. Esophagus is unremarkable in appearance. No  axillary lymphadenopathy. Lungs/Pleura: Multiple large pulmonary nodules and masses are again noted in the lungs bilaterally, generally increased in size compared to the prior examination. The largest of these is in the left upper lobe near the apex (axial image 17 of series 2) measuring 5.5 x 5.6 cm, significantly increased from 2.1 x 1.8 cm on the prior examination. This lesion is centrally low-attenuation, suggesting internal areas of necrosis. Small bilateral pleural effusions. New large left pneumothorax with considerable atelectasis in the left lung. Musculoskeletal: There are no aggressive appearing lytic or blastic lesions noted in the visualized portions of the skeleton. CT ABDOMEN PELVIS FINDINGS Hepatobiliary: No suspicious cystic or solid hepatic lesions. No intra or extrahepatic biliary ductal dilatation. Status post cholecystectomy. Pancreas: No pancreatic mass. No pancreatic ductal dilatation. No pancreatic or peripancreatic fluid collections or inflammatory changes. Spleen: Unremarkable. Adrenals/Urinary Tract: In the upper pole the left kidney there is enhancing lesion measuring 1.7 cm in diameter, increased in size compared to the prior study (previously 1.2 cm), concerning for neoplasm. Exophytic 1.2 cm low-attenuation lesion in the lower pole the left kidney, compatible with a small simple cyst. Right kidney and bilateral adrenal glands are normal in appearance. No hydroureteronephrosis. Urinary bladder is normal in appearance. Stomach/Bowel: The appearance of the stomach is normal. There is no pathologic dilatation of small bowel or colon. Normal appendix. Vascular/Lymphatic: Aortic atherosclerosis, without evidence of aneurysm or dissection in the abdominal or pelvic vasculature. No lymphadenopathy noted in the abdomen or pelvis. Reproductive: Prostate gland and seminal vesicles are unremarkable in appearance. Other: No significant volume of ascites.  No pneumoperitoneum. Musculoskeletal: There  are no aggressive appearing lytic or blastic lesions noted in the visualized portions of the skeleton. IMPRESSION: 1. New large left hydropneumothorax. 2. Widespread metastatic disease to the lungs appears progressive compared to the prior study, as above. 3. Enlarging probably enhancing lesion in the upper pole the left kidney concerning for neoplasm, either metastatic or primary. 4. Small right pleural effusion. 5. Aortic atherosclerosis, in addition to left main and 2 vessel coronary artery disease. Please note that although the presence of coronary artery calcium documents the presence of coronary artery disease, the severity of this disease and any potential stenosis cannot be assessed on this non-gated CT examination. Assessment for potential risk factor modification, dietary therapy or pharmacologic therapy may be warranted, if clinically indicated. 6. Additional incidental findings, as above. Critical Value/emergent results were called by telephone at the time of interpretation on 02/06/2020 at 11:35 am to provider [facility] , who verbally acknowledged these results. Electronically Signed   By: Vinnie Langton M.D.   On: 02/06/2020 11:36  DG Chest Portable 1 View  Result Date: 02/07/2020 CLINICAL DATA:  Follow-up left pneumothorax.  Lung carcinoma. EXAM: PORTABLE CHEST 1 VIEW COMPARISON:  Prior today FINDINGS: There has been placement of a left pleural pigtail catheter since previous study, with near complete resolution of left pneumothorax since prior exam. Multiple pulmonary nodules and masses are again seen in both lungs, largest in the left upper lobe and right lower lobe. Small right pleural effusion remains stable. Right-sided Port-A-Cath remains in appropriate position. IMPRESSION: Near complete resolution of left pneumothorax following left pleural catheter placement. Electronically Signed   By: Marlaine Hind M.D.   On: 02/07/2020 14:55   DG Chest Portable 1 View  Result Date:  02/07/2020 CLINICAL DATA:  Left pneumothorax. EXAM: PORTABLE CHEST 1 VIEW COMPARISON:  CT 02/06/2020. FINDINGS: Mediastinum hilar structures normal. Borderline cardiomegaly. Large left pneumothorax noted. Similar finding noted on prior CT. Prominent atelectasis left lung. Multiple prominent bilateral pulmonary masses again noted as noted on prior CT. No acute bony abnormality identified. Contrast in the colon. IMPRESSION: 1. Large left pneumothorax. Similar finding noted on prior CT. Prominent atelectasis left lung. 2. Multiple prominent bilateral pulmonary masses again noted as noted on prior CT. Electronically Signed   By: Marcello Moores  Register   On: 02/07/2020 13:50    CBC Recent Labs  Lab 02/07/20 0901 02/07/20 1447 02/07/20 2210 02/08/20 0450  WBC 8.0 9.5  --  7.1  HGB 12.3* 11.6* 11.2* 10.9*  HCT 38.3* 36.5* 34.9* 33.9*  PLT 247 274  --  265  MCV 100.0 100.6*  --  100.3*  MCH 32.1 32.0  --  32.2  MCHC 32.1 31.8  --  32.2  RDW 18.1* 18.2*  --  18.1*  LYMPHSABS 0.5* 0.5*  --   --   MONOABS 0.7 0.8  --   --   EOSABS 0.1 0.1  --   --   BASOSABS 0.0 0.1  --   --     Chemistries  Recent Labs  Lab 02/07/20 0901 02/07/20 1447  NA 136 136  K 3.6 3.9  CL 102 104  CO2 25 27  GLUCOSE 128* 111*  BUN 12 12  CREATININE 0.67 0.63  CALCIUM 8.5* 8.3*  MG 2.0  --   AST 29 27  ALT 22 22  ALKPHOS 142* 145*  BILITOT 0.9 0.6   No results for input(s): CHOL, HDL, LDLCALC, TRIG, CHOLHDL, LDLDIRECT in the last 72 hours.  Lab Results  Component Value Date   HGBA1C 5.3 08/31/2019   ------------------------------------------------------------------------------------------------------------------ No results for input(s): TSH, T4TOTAL, T3FREE, THYROIDAB in the last 72 hours.  Invalid input(s): FREET3 ------------------------------------------------------------------------------------------------------------------ No results for input(s): VITAMINB12, FOLATE, FERRITIN, TIBC, IRON, RETICCTPCT in  the last 72 hours.  Coagulation profile No results for input(s): INR, PROTIME in the last 168 hours.  No results for input(s): DDIMER in the last 72 hours.  Cardiac Enzymes No results for input(s): CKMB, TROPONINI, MYOGLOBIN in the last 168 hours.  Invalid input(s): CK ------------------------------------------------------------------------------------------------------------------ No results found for: BNP   Roxan Hockey M.D on 02/08/2020 at 2:19 PM  Go to [email or web address] - for contact info  [facility] - Office  [phone]

## 2020-02-09 ENCOUNTER — Inpatient Hospital Stay (HOSPITAL_COMMUNITY): Payer: 59

## 2020-02-09 ENCOUNTER — Encounter (HOSPITAL_COMMUNITY): Payer: 59

## 2020-02-09 DIAGNOSIS — J948 Other specified pleural conditions: Secondary | ICD-10-CM | POA: Diagnosis present

## 2020-02-09 DIAGNOSIS — J939 Pneumothorax, unspecified: Secondary | ICD-10-CM | POA: Diagnosis not present

## 2020-02-09 MED ORDER — GABAPENTIN 300 MG PO CAPS
300.0000 mg | ORAL_CAPSULE | Freq: Three times a day (TID) | ORAL | Status: DC
  Administered 2020-02-09 – 2020-02-16 (×20): 300 mg via ORAL
  Filled 2020-02-09 (×20): qty 1

## 2020-02-09 MED ORDER — GABAPENTIN 100 MG PO CAPS
200.0000 mg | ORAL_CAPSULE | Freq: Four times a day (QID) | ORAL | Status: DC
  Administered 2020-02-09 (×2): 200 mg via ORAL
  Filled 2020-02-09 (×3): qty 2

## 2020-02-09 MED ORDER — GUAIFENESIN-DM 100-10 MG/5ML PO SYRP
5.0000 mL | ORAL_SOLUTION | ORAL | Status: DC | PRN
  Administered 2020-02-09: 5 mL via ORAL
  Filled 2020-02-09: qty 5

## 2020-02-09 MED ORDER — LIDOCAINE HCL (PF) 1 % IJ SOLN
INTRAMUSCULAR | Status: AC
  Filled 2020-02-09: qty 5

## 2020-02-09 NOTE — Progress Notes (Addendum)
Patient Demographics:    Dean Goldner, is a 60 y.o. male, DOB - December 12, 1960, KCM:[id number]  Admit date - 02/07/2020   Admitting Physician Bethena Roys, MD  Outpatient Primary MD for the patient is Celene Squibb, MD  LOS - 2   Chief Complaint  Patient presents with   Shortness of Breath        Subjective:    Gregery Labarge today has no fevers, no emesis,    02/09/20 -Patient's left-sided chest tube became somewhat dislodged -Left-sided chest tube removed by general surgeon Dr. Arnoldo Morale -Repeat chest x-ray showed enlarging left-sided pneumothorax after removal of chest tube -A new chest tube was placed by Dr. Christinia Gully from pulmonary critical care service -Dr. 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 Melvyn Novas recommends transfer to [facility] for CT surgery evaluation the hydropneumothorax is unlikely to resolve on its own given its association with necrotic malignant mass   A/p 1)Large Lt Hydropneumothorax--in the setting of metastatic liposarcoma -Status post chest tube placement on 02/07/2020 -02/09/20 -Patient's left-sided chest tube which was placed  initially on 02/07/2020 became somewhat dislodged -Left-sided chest tube removed by general surgeon Dr. Arnoldo Morale -Repeat chest x-ray showed enlarging left-sided pneumothorax after removal of chest tube -A new chest tube was placed on 02/09/2020 by Dr. Christinia Gully from pulmonary critical care service -Dr. Melvyn Novas recommends transfer to [facility] for CT surgery evaluation the hydropneumothorax is unlikely to resolve on its own given its association with necrotic malignant mass -LUL mass which may be Necrotic and  so may not close on its own ----Pulmonary consult from Dr. Melvyn Novas appreciated -CT surgery--- Dr Cyndia Bent and his physician assistant Ellwood Handler--- aware/notified  2) liposarcoma of left lower extremity--- metastatic to both lungs, kidney and ribs -Left calf mass biopsy on 08/09/2018 consistent with grade 2 liposarcoma -Oncologist Dr. Delton Coombes recommends no further chemotherapy due to progression of disease despite chemoradiation therapy and wide local excision ---Palliative care consult appreciated  3) bilateral rib pain----Lytic focus in the left posterior fourth rib is slightly prominent, suspect related to metastatic lung and rib disease -Continue as needed opiates  4) depression and anxiety----related to above, anxiety is worse now that patient's wife has also been diagnosed with a malignancy -As needed Xanax and scheduled Lexapro  5) social/ethics--care consult appreciated  --- remains full scope of treatment however per oncologist he is not a candidate for further chemotherapy  Disposition/Need for in-Hospital Stay- patient unable to be discharged at this time due to left-sided hydropneumothorax requiring chest tube placement, pain control and ongoing pulmonology management of his  chest tube*  Status is: Inpatient  Remains inpatient appropriate because:See above   Disposition: The patient is from: Home              Anticipated d/c is to: Home              Anticipated  d/c date is: > 3 days              Patient currently is not medically stable to d/c. Barriers: Not Clinically Stable-   Procedures- Left-sided chest tube on 02/07/2020 in the ED -Left-sided chest tube removal on 02/09/2020 under fluoroscopy New/replaced left-sided chest tube was placed on 02/09/2020   Code Status :  -  Code Status: Full Code   Family Communication:    NA (patient is alert, awake and coherent)  Discussed with wife at bedside on 02/09/20  Consults  :  Pulm/Palliative Care  DVT Prophylaxis  :   - SCDs  SCDs Start: 02/07/20 2155  Lab Results  Component Value Date   PLT 265 02/08/2020    Inpatient Medications  Scheduled Meds:  albuterol  2.5 mg Nebulization BID   benzonatate  200 mg Oral TID   Chlorhexidine Gluconate Cloth  6 each Topical Daily   chlorpheniramine-HYDROcodone  5 mL Oral Q12H   escitalopram  10 mg Oral Daily   gabapentin  200 mg Oral QID   megestrol  400 mg Oral BID   pantoprazole  40 mg Oral BID AC   Continuous Infusions: PRN Meds:.acetaminophen **OR** acetaminophen, albuterol, ALPRAZolam, HYDROmorphone (DILAUDID) injection, ondansetron **OR** ondansetron (ZOFRAN) IV, oxyCODONE, polyethylene glycol, zolpidem    Anti-infectives (From admission, onward)   None        Objective:   Vitals:   02/08/20 2034 02/08/20 2142 02/09/20 0931 02/09/20 1418  BP:  119/74  114/76  Pulse:  99  93  Resp:  18  20  Temp:  98 F (36.7 C)  98.5 F (36.9 C)  TempSrc:  Oral  Oral  SpO2: 92% 91% 91% 94%  Weight:      Height:        Wt Readings from Last 3 Encounters:  02/08/20 91.9 kg  02/07/20 93.4 kg  01/31/20 94.3 kg     Intake/Output Summary (Last 24 hours) at 02/09/2020 1623 Last data filed at 02/09/2020 1300 Gross per 24 hour  Intake 480 ml  Output 400 ml  Net 80 ml   Physical Exam  Gen:- Awake Alert,  In no apparent distress  HEENT:- [place].AT, No sclera icterus Neck-Supple Neck,No JVD,.  Lungs-left-sided chest tube in situ, air  movement is fair symmetrical CV- S1, S2 normal, regular, right-sided Port-A-Cath in situ Abd-  +ve B.Sounds, Abd Soft, No tenderness,    Extremity/Skin:- No  edema, pedal pulses present  Psych-affect is anxious, oriented x3 Neuro-generalized weakness no new focal deficits, no tremors   Data Review:   Micro Results Recent Results (from the past 240 hour(s))  SARS Coronavirus 2 by RT PCR (hospital order, performed in [facility] hospital lab) Nasopharyngeal Nasopharyngeal Swab     Status: None   Collection Time: 02/07/20  2:31 PM   Specimen: Nasopharyngeal Swab  Result Value Ref Range Status   SARS Coronavirus 2 NEGATIVE NEGATIVE Final    Comment: (NOTE) SARS-CoV-2 target nucleic acids are NOT DETECTED.  The SARS-CoV-2 RNA is generally detectable in upper and lower respiratory specimens during the acute phase of infection. The lowest concentration of SARS-CoV-2 viral copies this assay can detect is 250 copies /  mL. A negative result does not preclude SARS-CoV-2 infection and should not be used as the sole basis for treatment or other patient management decisions.  A negative result may occur with improper specimen collection / handling, submission of specimen other than nasopharyngeal swab, presence of viral mutation(s) within the areas targeted by this assay, and inadequate number of viral copies (<250 copies / mL). A negative result must be combined with clinical observations, patient history, and epidemiological information.  Fact Sheet for Patients:   StrictlyIdeas.no  Fact Sheet for Healthcare Providers: BankingDealers.co.za  This test is not yet approved or  cleared by the [place] FDA and has been authorized for detection and/or diagnosis of SARS-CoV-2 by FDA under an Emergency Use Authorization (EUA).  This EUA will remain in effect (meaning this test can be used) for the duration of the COVID-19 declaration under  Section 564(b)(1) of the Act, 21 U.S.C. section 360bbb-3(b)(1), unless the authorization is terminated or revoked sooner.  Performed at [facility], [address] [place], [place] [address]     Radiology Reports CT CHEST ABDOMEN PELVIS W CONTRAST  Result Date: 02/06/2020 CLINICAL DATA:  60 year old male with history of liposarcoma in the left lower extremity. EXAM: CT CHEST, ABDOMEN, AND PELVIS WITH CONTRAST TECHNIQUE: Multidetector CT imaging of the chest, abdomen and pelvis was performed following the standard protocol during bolus administration of intravenous contrast. CONTRAST:  184mL OMNIPAQUE IOHEXOL 300 MG/ML  SOLN COMPARISON:  CT the chest, abdomen and pelvis 11/01/2019. FINDINGS: CT CHEST FINDINGS Cardiovascular: Heart size is normal. Trace amount of pericardial fluid and/or thickening, unlikely to be of hemodynamic significance at this time. No associated pericardial calcification. There is aortic atherosclerosis, as well as atherosclerosis of the great vessels of the mediastinum and the coronary arteries, including calcified atherosclerotic plaque in the left main, left anterior descending and right coronary arteries.Right internal jugular single-lumen porta cath with tip terminating at the superior cavoatrial junction. Mediastinum/Nodes: No pathologically enlarged mediastinal or hilar lymph nodes. Esophagus is unremarkable in appearance. No axillary lymphadenopathy. Lungs/Pleura: Multiple large pulmonary nodules and masses are again noted in the lungs bilaterally, generally increased in size compared to the prior examination. The largest of these is in the left upper lobe near the apex (axial image 17 of series 2) measuring 5.5 x 5.6 cm, significantly increased from 2.1 x 1.8 cm on the prior examination. This lesion is centrally low-attenuation, suggesting internal areas of necrosis. Small bilateral pleural effusions. New large left pneumothorax with considerable atelectasis in the left  lung. Musculoskeletal: There are no aggressive appearing lytic or blastic lesions noted in the visualized portions of the skeleton. CT ABDOMEN PELVIS FINDINGS Hepatobiliary: No suspicious cystic or solid hepatic lesions. No intra or extrahepatic biliary ductal dilatation. Status post cholecystectomy. Pancreas: No pancreatic mass. No pancreatic ductal dilatation. No pancreatic or peripancreatic fluid collections or inflammatory changes. Spleen: Unremarkable. Adrenals/Urinary Tract: In the upper pole the left kidney there is enhancing lesion measuring 1.7 cm in diameter, increased in size compared to the prior study (previously 1.2 cm), concerning for neoplasm. Exophytic 1.2 cm low-attenuation lesion in the lower pole the left kidney, compatible with a small simple cyst. Right kidney and bilateral adrenal glands are normal in appearance. No hydroureteronephrosis. Urinary bladder is normal in appearance. Stomach/Bowel: The appearance of the stomach is normal. There is no pathologic dilatation of small bowel or colon. Normal appendix. Vascular/Lymphatic: Aortic atherosclerosis, without evidence of aneurysm or dissection in the abdominal or pelvic vasculature. No lymphadenopathy noted in the  abdomen or pelvis. Reproductive: Prostate gland and seminal vesicles are unremarkable in appearance. Other: No significant volume of ascites.  No pneumoperitoneum. Musculoskeletal: There are no aggressive appearing lytic or blastic lesions noted in the visualized portions of the skeleton. IMPRESSION: 1. New large left hydropneumothorax. 2. Widespread metastatic disease to the lungs appears progressive compared to the prior study, as above. 3. Enlarging probably enhancing lesion in the upper pole the left kidney concerning for neoplasm, either metastatic or primary. 4. Small right pleural effusion. 5. Aortic atherosclerosis, in addition to left main and 2 vessel coronary artery disease. Please note that although the presence of  coronary artery calcium documents the presence of coronary artery disease, the severity of this disease and any potential stenosis cannot be assessed on this non-gated CT examination. Assessment for potential risk factor modification, dietary therapy or pharmacologic therapy may be warranted, if clinically indicated. 6. Additional incidental findings, as above. Critical Value/emergent results were called by telephone at the time of interpretation on 02/06/2020 at 11:35 am to provider [facility] , who verbally acknowledged these results. Electronically Signed   By: Vinnie Langton M.D.   On: 02/06/2020 11:36   DG CHEST PORT 1 VIEW  Result Date: 02/09/2020 CLINICAL DATA:  Follow-up hydropneumothorax. Status post chest tube removal. EXAM: PORTABLE CHEST 1 VIEW COMPARISON:  02/09/2020 FINDINGS: Right chest wall port a catheter is noted with tip at the cavoatrial junction. The left-sided chest tube has been removed. Small left apical pneumothorax persists. Unchanged appearance of gas within the soft tissues of the left chest wall. Masslike opacities within the left upper lobe and right base are unchanged. IMPRESSION: Persistent small left apical pneumothorax status post left-sided chest tube removal. Electronically Signed   By: Kerby Moors M.D.   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 Recent left pneumothorax. EXAM: PORTABLE CHEST 1 VIEW COMPARISON:  Portable chest 02/08/2020 and earlier. FINDINGS: Portable AP upright view at 0816 hours. Left chest tube is in place although there may be side holes outside of the left pleural space (arrow). A small volume of left chest wall gas has increased. Small residual left pneumothorax. No mediastinal shift. Masslike bilateral pulmonary opacities compatible with known metastases. Stable cardiac size and mediastinal contours. Visualized tracheal air column is within normal  limits. Stable right chest porta cath. Negative visible bowel gas pattern. Stable visualized osseous structures. IMPRESSION: 1. Left chest tube in place although there may be tube side holes outside of the pleural space. A small volume of chest wall gas has increased. 2. Stable small residual pneumothorax. 3. Stable pulmonary metastases. Electronically Signed   By: Genevie Ann M.D.   On: 02/09/2020 08:25   DG CHEST PORT 1 VIEW  Result Date: 02/08/2020 CLINICAL DATA:  Hydropneumothorax, chest tube on LEFT EXAM: PORTABLE CHEST 1 VIEW COMPARISON:  Portable exam 3976 hours compared to 02/07/2020 FINDINGS: Pigtail RIGHT thoracostomy tube, partially withdrawn since previous exam with tip projecting at LEFT costal margin. Persistent small LEFT apex pneumothorax. Basilar LEFT pleural effusion. Normal heart size mediastinal contours. RIGHT jugular Port-A-Cath stable tip projecting over SVC. Atelectasis versus infiltrate at RIGHT base. BILATERAL pulmonary nodules, largest a mass at LEFT apex. Bones demineralized. IMPRESSION: Persistent small LEFT hydropneumothorax. LEFT thoracostomy tube has been partially withdrawn with the pigtail now at LEFT costal margin. Electronically Signed   By: Lavonia Dana M.D.   On: 02/08/2020 15:11   DG Chest Portable  1 View  Result Date: 02/07/2020 CLINICAL DATA:  Follow-up left pneumothorax.  Lung carcinoma. EXAM: PORTABLE CHEST 1 VIEW COMPARISON:  Prior today FINDINGS: There has been placement of a left pleural pigtail catheter since previous study, with near complete resolution of left pneumothorax since prior exam. Multiple pulmonary nodules and masses are again seen in both lungs, largest in the left upper lobe and right lower lobe. Small right pleural effusion remains stable. Right-sided Port-A-Cath remains in appropriate position. IMPRESSION: Near complete resolution of left pneumothorax following left pleural catheter placement. Electronically Signed   By: Marlaine Hind M.D.   On:  02/07/2020 14:55   DG Chest Portable 1 View  Result Date: 02/07/2020 CLINICAL DATA:  Left pneumothorax. EXAM: PORTABLE CHEST 1 VIEW COMPARISON:  CT 02/06/2020. FINDINGS: Mediastinum hilar structures normal. Borderline cardiomegaly. Large left pneumothorax noted. Similar finding noted on prior CT. Prominent atelectasis left lung. Multiple prominent bilateral pulmonary masses again noted as noted on prior CT. No acute bony abnormality identified. Contrast in the colon. IMPRESSION: 1. Large left pneumothorax. Similar finding noted on prior CT. Prominent atelectasis left lung. 2. Multiple prominent bilateral pulmonary masses again noted as noted on prior CT. Electronically Signed   By: Marcello Moores  Register   On: 02/07/2020 13:50   DG Chest Port 1V same Day  Result Date: 02/09/2020 CLINICAL DATA:  Chest tube removal EXAM: PORTABLE CHEST 1 VIEW COMPARISON:  02/09/2020 FINDINGS: Further increased size of the left pneumothorax, now noted laterally and at the left apex, approximately 15%. Right Port-A-Cath remains in place, unchanged. Airspace opacity at the right lung base is unchanged. Heart is borderline in size. Subcutaneous emphysema throughout the left chest wall. IMPRESSION: Enlarging left pneumothorax, now approximately 15%. Otherwise no change. These results will be called to the ordering clinician or representative by the Radiologist Assistant, and communication documented in the PACS or [employer]. Electronically Signed   By: Rolm Baptise M.D.   On: 02/09/2020 15:33    CBC Recent Labs  Lab 02/07/20 0901 02/07/20 1447 02/07/20 2210 02/08/20 0450  WBC 8.0 9.5  --  7.1  HGB 12.3* 11.6* 11.2* 10.9*  HCT 38.3* 36.5* 34.9* 33.9*  PLT 247 274  --  265  MCV 100.0 100.6*  --  100.3*  MCH 32.1 32.0  --  32.2  MCHC 32.1 31.8  --  32.2  RDW 18.1* 18.2*  --  18.1*  LYMPHSABS 0.5* 0.5*  --   --   MONOABS 0.7 0.8  --   --   EOSABS 0.1 0.1  --   --   BASOSABS 0.0 0.1  --   --     Chemistries   Recent Labs  Lab 02/07/20 0901 02/07/20 1447  NA 136 136  K 3.6 3.9  CL 102 104  CO2 25 27  GLUCOSE 128* 111*  BUN 12 12  CREATININE 0.67 0.63  CALCIUM 8.5* 8.3*  MG 2.0  --   AST 29 27  ALT 22 22  ALKPHOS 142* 145*  BILITOT 0.9 0.6   No results for input(s): CHOL, HDL, LDLCALC, TRIG, CHOLHDL, LDLDIRECT in the last 72 hours.  Lab Results  Component Value Date   HGBA1C 5.3 08/31/2019   ------------------------------------------------------------------------------------------------------------------ No results for input(s): TSH, T4TOTAL, T3FREE, THYROIDAB in the last 72 hours.  Invalid input(s): FREET3 ------------------------------------------------------------------------------------------------------------------ No results for input(s): VITAMINB12, FOLATE, FERRITIN, TIBC, IRON, RETICCTPCT in the last 72 hours.  Coagulation profile No results for input(s): INR, PROTIME in the last 168 hours.  No  results for input(s): DDIMER in the last 72 hours.  Cardiac Enzymes No results for input(s): CKMB, TROPONINI, MYOGLOBIN in the last 168 hours.  Invalid input(s): CK ------------------------------------------------------------------------------------------------------------------ No results found for: BNP   Roxan Hockey M.D on 02/09/2020 at 4:23 PM  Go to [email or web address] - for contact info  [facility] - Office  [phone]

## 2020-02-09 NOTE — Progress Notes (Signed)
Palliative:  HPI: 60 y.o. 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 Delton Coombes admitted on 02/07/2020 with difficulty breathing due to hydropneumothorax. Chest tube was inserted in ED. Chest tube dislodged so was removed and monitored but required chest tube replacement 02/09/20.   I met today with Leonard [name]. He has had a difficult morning with issues with chest tube. Plans to monitor, repeat CXR, and replace chest tube if needed. If chest tube needing to be replaced he may need transfer to [facility] for management of chest tube over the weekend. Leonard [name] is aware of plan and although he does not wish to go to [facility] he understands an agrees with transfer if this is felt to be best for him.   We also discussed goals of care further. He has not had a chance to speak with his wife yet. They have not made any decisions. He shares that he needs more time to consider and discuss DNR. I explained that I fully understand. He does share that he questions DNR now and if he is doing well and up and getting around he may reconsider. I did explain that I worry if he progresses to requiring resuscitation attempt at any stage that he prognosis and outcome would not be expected to be good given his underlying advanced cancer. I also worry if he were to survive resuscitation attempt that he would be left with very poor quality of life in addition to progressing cancer.   All questions/concerns addressed. Emotional support provided.   Exam: Alert, oriented. No distress. Breathing regular, unlabored. Abd flat. Moves all extremities.   Plan: - Would benefit from outpatient palliative to follow.   15 min  Vinie Sill, NP Palliative Medicine Team Pager [phone] (Please see [email or web address] for schedule) Team Phone [phone]    Greater than 50%  of this time was spent counseling and coordinating care related to the above assessment and plan

## 2020-02-09 NOTE — Progress Notes (Addendum)
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  Patient was reffered to the ED after outpatient CT done yesterday showed a large pneumothorax. Worsening of his difficulty breathing and wheezing over the past few days.  He also reported some pain to the left flank.  He denies vomiting, but reports poor oral intake.  Reports intermittent diarrhea.  Patient had CT chest and abdomen with contrast 1/25  that showed hydropneumothorax.  With mild spread metastatic disease to the lungs that appear to have progressed from prior study.  Also an enlarging probably enhancing lesion to the left upper pole of the left kidney concerning for neoplasm metastatic or primary.   Last RT nov 2021, no longer on chemo as of a week PTA as progression on dz, seeking   opinion from tertiary med centers.     ED Course: Stable vitals.  O2 sats 92 to 95% on room air, placed on 2 L nasal cannula.  Stable CBC and CMP. COVID test negative.  Chest tube was successfully placed in the ED.  Pulmonologist Dr. Melvyn Novas was consulted, agreed with hospitalization here, for now.  Past Medical History:   Past Medical History:  Diagnosis Date  . Cancer [facility])    metastatic lung cancer  . Port-A-Cath in place 05/19/2019   right     Significant Hospital Events:     Consults:  PCCM 1/26   Procedures:  L pigtain 1/26     Scheduled Meds: . albuterol  2.5 mg Nebulization BID  . benzonatate  200 mg Oral TID  . Chlorhexidine Gluconate Cloth  6 each Topical Daily  . chlorpheniramine-HYDROcodone  5 mL Oral Q12H   . escitalopram  10 mg Oral Daily  . gabapentin  100 mg Oral QID  . megestrol  400 mg Oral BID  . pantoprazole  40 mg Oral BID AC   Continuous Infusions: PRN Meds:.acetaminophen **OR** acetaminophen, albuterol, ALPRAZolam, guaiFENesin-dextromethorphan, HYDROmorphone (DILAUDID) injection, ondansetron **OR** ondansetron (ZOFRAN) IV, oxyCODONE, polyethylene glycol, zolpidem     Interim History / Subjective:  No sob or cp, L ct no longer bubbling with a/f level well above the water seal line indicating no active leak   Objective   Blood pressure 119/74, pulse 99, temperature 98 F (36.7 C), temperature source Oral, resp. rate 18, height 5' 10"  (1.778 m), weight 91.9 kg, SpO2 91 %.        Intake/Output Summary (Last 24 hours) at 02/09/2020 8938 Last data filed at 02/08/2020 1933 Gross per 24 hour  Intake 480 ml  Output 400 ml  Net 80 ml   Filed Weights   02/08/20 0924  Weight: 91.9 kg    Examination: Tmax  98.3 Pt alert, approp nad @30  degrees No jvd Oropharynx clear,  mucosa nl Neck supple Lungs clear  bilaterally RRR no s3 or or sign murmur Abd obese with nl excursion  Extr warm with no edema or clubbing noted Neuro  Sensorium intact,  no apparent motor deficits     I personally reviewed images and agree with radiology impression  as follows:  CXR:   Portable 1/28 Chest tube almost out, minimal residual PTX         Assessment & Plan:  1) spontaneous L PTX in pt with met sarcoma to lung with no air leak this am on water seal  >> control cough with gabapentin 100 mg increase to 200 mg qid plus tussionex 1 tsp bid and in between oxycodone if pain or cough   >>> chest tube needs to come out at this point regardless so place on 6lpm and pull tube and repeat cxr in 2 hours/ reviewed plan with Dr Denton Brick and [place] RN charge nurse from ICU   >> thoracotomy tray to bedside in case needs to be placed back in emergently      Labs   CBC: Recent Labs  Lab  02/07/20 0901 02/07/20 1447 02/07/20 2210 02/08/20 0450  WBC 8.0 9.5  --  7.1  NEUTROABS 6.7 8.0*  --   --   HGB 12.3* 11.6* 11.2* 10.9*  HCT 38.3* 36.5* 34.9* 33.9*  MCV 100.0 100.6*  --  100.3*  PLT 247 274  --  570    Basic Metabolic Panel: Recent Labs  Lab 02/07/20 0901 02/07/20 1447  NA 136 136  K 3.6 3.9  CL 102 104  CO2 25 27  GLUCOSE 128* 111*  BUN 12 12  CREATININE 0.67 0.63  CALCIUM 8.5* 8.3*  MG 2.0  --    GFR: Estimated Creatinine Clearance: 111.9 mL/min (by C-G formula based on SCr of 0.63 mg/dL). Recent Labs  Lab 02/07/20 0901 02/07/20 1447 02/08/20 0450  WBC 8.0 9.5 7.1    Liver Function Tests: Recent Labs  Lab 02/07/20 0901 02/07/20 1447  AST 29 27  ALT 22 22  ALKPHOS 142* 145*  BILITOT 0.9 0.6  PROT 6.3* 5.5*  ALBUMIN 3.2* 3.1*   No results for input(s): LIPASE, AMYLASE in the last 168 hours. No results for input(s): AMMONIA in the last 168 hours.  ABG No results found for: PHART, PCO2ART, PO2ART, HCO3, TCO2, ACIDBASEDEF, O2SAT   Coagulation Profile: No results for input(s): INR, PROTIME in the last 168 hours.  Cardiac Enzymes: No results for input(s): CKTOTAL, CKMB, CKMBINDEX, TROPONINI in the last 168 hours.  HbA1C: Hgb A1c MFr Bld  Date/Time Value Ref Range Status  08/31/2019 10:17 AM 5.3 4.8 - 5.6 % Final    Comment:    (NOTE) Pre diabetes:          5.7%-6.4%  Diabetes:              >6.4%  Glycemic control for   <7.0% adults with diabetes   08/01/2018 12:11 PM 5.5 4.8 - 5.6 % Final    Comment:    (NOTE) Pre diabetes:          5.7%-6.4% Diabetes:              >6.4% Glycemic control for   <7.0% adults with diabetes     CBG: No results for input(s): GLUCAP in the last 168 hours.      Christinia Gully, MD Pulmonary and [place] [phone]   After 7:00 pm call Elink  [phone]

## 2020-02-09 NOTE — Procedures (Signed)
Time out performed   Sterile prep/drape  1% xylocaine and dilaudid x 2 mg IV given   At approx C5/6  3.5 cm eliptical incision at ant ax line, and using blunt dissection with kellyclamp, L pleural space easily entered with rush of air and #28 Fr Chest placed to 12 cm with good resp variation to water seal.  Pt tolerated well and f/u cxr ordered.   Ok for transport to [facility] once his cxr is done   Christinia Gully, MD Pulmonary and [place] [phone]   After 7:00 pm call Elink  [phone]

## 2020-02-09 NOTE — Plan of Care (Signed)
  Problem: Education: Goal: Knowledge of General Education information will improve Description: Including pain rating scale, medication(s)/side effects and non-pharmacologic comfort measures Outcome: Progressing   Problem: Health Behavior/Discharge Planning: Goal: Ability to manage health-related needs will improve Outcome: Not Progressing   Problem: Clinical Measurements: Goal: Ability to maintain clinical measurements within normal limits will improve Outcome: Not Progressing Goal: Will remain free from infection Outcome: Progressing Goal: Diagnostic test results will improve Outcome: Not Progressing Goal: Respiratory complications will improve Outcome: Not Progressing Goal: Cardiovascular complication will be avoided Outcome: Progressing   Problem: Activity: Goal: Risk for activity intolerance will decrease Outcome: Not Progressing   Problem: Nutrition: Goal: Adequate nutrition will be maintained Outcome: Not Progressing   Problem: Coping: Goal: Level of anxiety will decrease Outcome: Not Progressing   Problem: Elimination: Goal: Will not experience complications related to bowel motility Outcome: Progressing Goal: Will not experience complications related to urinary retention Outcome: Progressing   Problem: Pain Managment: Goal: General experience of comfort will improve Outcome: Progressing   Problem: Safety: Goal: Ability to remain free from injury will improve Outcome: Progressing   Problem: Skin Integrity: Goal: Risk for impaired skin integrity will decrease Outcome: Progressing

## 2020-02-09 NOTE — Progress Notes (Signed)
Requested to remove chest tube that was placed in the emergency room.  Left pigtail catheter removed from left chest without difficulty.  Dressing applied.  Patient tolerated procedure well.  Follow-up chest x-ray will be ordered by Dr. Denton Brick.

## 2020-02-10 DIAGNOSIS — J939 Pneumothorax, unspecified: Secondary | ICD-10-CM | POA: Diagnosis not present

## 2020-02-10 DIAGNOSIS — J948 Other specified pleural conditions: Secondary | ICD-10-CM

## 2020-02-10 MED ORDER — SODIUM CHLORIDE 0.9% FLUSH
10.0000 mL | INTRAVENOUS | Status: DC | PRN
  Administered 2020-02-11 – 2020-02-16 (×4): 10 mL

## 2020-02-10 MED ORDER — GUAIFENESIN-DM 100-10 MG/5ML PO SYRP
5.0000 mL | ORAL_SOLUTION | ORAL | Status: DC | PRN
  Administered 2020-02-10 – 2020-02-16 (×20): 5 mL via ORAL
  Filled 2020-02-10 (×20): qty 5

## 2020-02-10 NOTE — Progress Notes (Signed)
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  Had pig tail catheter in ER and PCCM consulted.  Significant Hospital Events:  1/26 Admit, pig tail catheter placed 1/28 pig catheter removed >> developed pneumothorax again and chest tube replaced  Consults:  Palliative care (at [facility]) Cardiothoracic surgery  Procedures:  L pigtain 1/26 >> 1/28 Lt chest tube 1/28 >>   Significant tests:   CT chest/abd/pelvis 1/25 >> multiple pulmonary nodules and masses b/l increased in size compared to October 2021 largest in LUL with central necrosis, small b/l effusions, large Lt pneumothorax, enhancing lesion 1.7 cm increased in size  Interim History / Subjective:  Has pain in chest which coughing.  Objective   Blood pressure 122/82, pulse 82, temperature 99 F (37.2 C), temperature source Oral, resp. rate 17, height 5\' 10"  (1.778 m), weight 91.9 kg, SpO2 98 %.        Intake/Output Summary (Last 24 hours) at 02/10/2020 1524 Last data filed at 02/10/2020 1400 Gross per 24 hour  Intake 600 ml  Output 1550 ml  Net -950 ml   Filed Weights   02/08/20 0924  Weight: 91.9 kg    Examination:  General - alert Eyes - pupils reactive ENT - no sinus tenderness, no stridor Cardiac - regular rate/rhythm, no murmur Chest - no wheeze, Lt chest tube in place with air leak Abdomen - soft, non tender, + bowel sounds Extremities - no cyanosis, clubbing, or edema Skin - no rashes Neuro - normal strength, moves extremities, follows commands Psych - normal mood and behavior   Assessment & Plan:   Lt pneumothorax with persistent air leak. 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 - followed by Dr. Delton Coombes with oncology in [place] - completed 7 cycles of gemcitabine and docetaxel with continued progression; advised at visit on 02/07/20 that further chemotherapy likely not an option - he was to have Guardant 360 mutation testing and review with sarcoma specialists at [facility] or [facility]   Labs    CMP Latest Ref Rng & Units 02/07/2020 02/07/2020 01/31/2020  Glucose 70 - 99 mg/dL 111(H) 128(H) 139(H)  BUN 6 - 20 mg/dL 12 12 10   Creatinine 0.61 - 1.24 mg/dL 0.63 0.67 0.83  Sodium 135 - 145 mmol/L 136 136 136  Potassium 3.5 - 5.1 mmol/L 3.9 3.6 3.6  Chloride 98 - 111 mmol/L 104 102 101  CO2 22 - 32 mmol/L 27 25 25   Calcium 8.9 - 10.3 mg/dL 8.3(L) 8.5(L) 8.8(L)  Total Protein 6.5 - 8.1 g/dL 5.5(L) 6.3(L) 6.7  Total Bilirubin 0.3 - 1.2 mg/dL 0.6 0.9 0.7  Alkaline Phos 38 - 126 U/L 145(H) 142(H) 169(H)  AST 15 - 41 U/L 27 29 38  ALT 0 - 44 U/L 22 22 33    CBC Latest Ref Rng & Units 02/08/2020 02/07/2020 02/07/2020  WBC 4.0 - 10.5 K/uL 7.1 - 9.5  Hemoglobin 13.0 - 17.0 g/dL 10.9(L) 11.2(L) 11.6(L)  Hematocrit 39.0 - 52.0 % 33.9(L) 34.9(L) 36.5(L)  Platelets 150 - 400 K/uL 265 - 274  DG CHEST PORT 1 VIEW  Result Date: 02/09/2020 CLINICAL DATA:  Follow-up hydropneumothorax. Status post chest tube removal. EXAM: PORTABLE CHEST 1 VIEW COMPARISON:  02/09/2020 FINDINGS: Right chest wall port a catheter is noted with tip at the cavoatrial junction. The left-sided chest tube has been removed. Small left apical pneumothorax persists. Unchanged appearance of gas within the soft tissues of the left chest wall. Masslike opacities within the left upper lobe and right base are unchanged. IMPRESSION: Persistent small left apical pneumothorax status post left-sided chest tube removal. Electronically Signed   By: Kerby Moors M.D.   On: 02/09/2020 12:08   DG CHEST PORT  1 VIEW  Result Date: 02/09/2020 CLINICAL DATA:  59 year old male with metastatic sarcoma. Recent left pneumothorax. EXAM: PORTABLE CHEST 1 VIEW COMPARISON:  Portable chest 02/08/2020 and earlier. FINDINGS: Portable AP upright view at 0816 hours. Left chest tube is in place although there may be side holes outside of the left pleural space (arrow). A small volume of left chest wall gas has increased. Small residual left pneumothorax. No mediastinal shift. Masslike bilateral pulmonary opacities compatible with known metastases. Stable cardiac size and mediastinal contours. Visualized tracheal air column is within normal limits. Stable right chest porta cath. Negative visible bowel gas pattern. Stable visualized osseous structures. IMPRESSION: 1. Left chest tube in place although there may be tube side holes outside of the pleural space. A small volume of chest wall gas has increased. 2. Stable small residual pneumothorax. 3. Stable pulmonary metastases. Electronically Signed   By: Genevie Ann M.D.   On: 02/09/2020 08:25   DG Chest Port 1V same Day  Result Date: 02/09/2020 CLINICAL DATA:  Shortness of breath, history of metastatic lung carcinoma EXAM: PORTABLE CHEST 1 VIEW COMPARISON:  Film from earlier in the same day. FINDINGS: Cardiac shadow is stable. Left-sided chest tube is again identified. Small left apical pneumothorax is again identified stable in appearance. Stable left upper lobe mass lesion is noted. Nodules are noted in the right lung as well as patchy atelectatic changes in the bases bilaterally. Considerable subcutaneous emphysema is noted. No bony abnormality is seen. Right chest wall port is noted. IMPRESSION: Stable left apical pneumothorax with chest tube in place. Changes of metastatic disease within the lungs. Electronically Signed   By: Inez Catalina M.D.   On: 02/09/2020 23:23   DG Chest Port 1V same Day  Result Date: 02/09/2020 CLINICAL DATA:  Pneumothorax.  Chest tube placement. EXAM:  PORTABLE CHEST 1 VIEW COMPARISON:  February 09, 2020 FINDINGS: There has been interval placement of a left-sided chest tube. There is a small residual left-sided pneumothorax, improved from prior study. The right-sided Port-A-Cath is well position. Bilateral airspace opacities are again noted, not substantially changed from prior study. There is subcutaneous emphysema along the patient's left flank, stable to improved from prior study. The heart size is unchanged. IMPRESSION: 1. Interval placement of a left-sided chest tube. 2. Small residual left-sided pneumothorax, improved from prior study. 3. Otherwise, no significant short interval change. Electronically Signed   By: Constance Holster M.D.   On: 02/09/2020 16:25   DG Chest Port 1V same Day  Result Date: 02/09/2020 CLINICAL DATA:  Chest tube removal EXAM: PORTABLE CHEST 1 VIEW COMPARISON:  02/09/2020 FINDINGS: Further increased size of the left pneumothorax, now noted laterally and at the left apex, approximately 15%. Right Port-A-Cath remains in place, unchanged. Airspace opacity at the right lung base is unchanged. Heart is borderline in size. Subcutaneous emphysema throughout the left  chest wall. IMPRESSION: Enlarging left pneumothorax, now approximately 15%. Otherwise no change. These results will be called to the ordering clinician or representative by the Radiologist Assistant, and communication documented in the PACS or [employer]. Electronically Signed   By: Rolm Baptise M.D.   On: 02/09/2020 15:33   Signature:  Chesley Mires, MD [place] Pager - [phone] 02/10/2020, 4:00 PM

## 2020-02-10 NOTE — Progress Notes (Signed)
[facility] PROGRESS NOTE    Progress Note  Leonard [name]  DQQ:[id number] DOB: Jan 15, 1960 DOA: 02/07/2020 PCP: Celene Squibb, MD     Brief Narrative:   Leonard [name] is an 60 y.o. 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  Pulmonary was consulted and recommended transfer to [facility] for CT surgery evaluation of the hydropneumothorax there is unlikely to resolve with problems this is associated with a necrotic malignant mass.  02/09/2020: Left-sided chest tube became dislodged removed by general surgeon Dr. Arnoldo Morale. Repeated chest x-ray showed an enlarged left pneumothorax after removal of chest tube. New chest tube placed by Dr. Leonides Schanz and recommended transfer to [facility] for CT surgery evaluation.  Assessment/Plan:   Left-sided hydropneumothorax--associated with necrotic malignant mass Chest tube replaced as it got dislodged and has pneumothorax got worse. CT surgery has been notified. Repeated chest x-ray shows stable left apical pneumothorax and chest tube in place.  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  Bilateral rib pain due to lytic lesions: Continue opiates as needed.  Pression and anxiety: Continue Xanax and Lexapro.  Ethics: The main full scope of treatment per oncology however the patient is not a candidate for chemotherapy. As of palliative care here at [facility]. I think the patient does not grasp the severity of his disease nor how advanced his metastatic cancer is.  He believes he is only in his lung but he has diffuse metastatic disease. I saw him about end-of-life and he refused to go there.    DVT prophylaxis: lovemox Family Communication:none Status is: Inpatient  Remains inpatient appropriate  because:Hemodynamically unstable   Dispo: The patient is from: Home              Anticipated d/c is to: Home              Anticipated d/c date is: > 3 days              Patient currently is not medically stable to d/c.   Difficult to place patient No        Code Status:     Code Status Orders  (From admission, onward)         Start     Ordered   02/07/20 2155  Full code  Continuous        02/07/20 2154        Code Status History    Date Active Date Inactive Code Status Order ID Comments User Context   08/01/2018 2150 08/02/2018 2123 Full Code [id number]  Bethena Roys, MD Inpatient   Advance Care Planning Activity        IV Access:    Peripheral IV   Procedures and diagnostic studies:   DG CHEST PORT 1 VIEW  Result Date: 02/09/2020 CLINICAL DATA:  Follow-up hydropneumothorax. Status post chest tube removal. EXAM: PORTABLE CHEST 1 VIEW COMPARISON:  02/09/2020 FINDINGS: Right chest wall port a catheter is noted with tip at the cavoatrial junction. The left-sided chest tube has been removed. Small left apical pneumothorax persists. Unchanged appearance of gas within the soft tissues of the left chest wall. Masslike opacities within the left upper lobe and right base are unchanged. IMPRESSION: Persistent small left apical pneumothorax status post left-sided chest tube removal. Electronically Signed   By: Kerby Moors  M.D.   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 Recent left pneumothorax. EXAM: PORTABLE CHEST 1 VIEW COMPARISON:  Portable chest 02/08/2020 and earlier. FINDINGS: Portable AP upright view at 0816 hours. Left chest tube is in place although there may be side holes outside of the left pleural space (arrow). A small volume of left chest wall gas has increased. Small residual left pneumothorax. No mediastinal shift. Masslike bilateral pulmonary opacities compatible  with known metastases. Stable cardiac size and mediastinal contours. Visualized tracheal air column is within normal limits. Stable right chest porta cath. Negative visible bowel gas pattern. Stable visualized osseous structures. IMPRESSION: 1. Left chest tube in place although there may be tube side holes outside of the pleural space. A small volume of chest wall gas has increased. 2. Stable small residual pneumothorax. 3. Stable pulmonary metastases. Electronically Signed   By: Genevie Ann M.D.   On: 02/09/2020 08:25   DG CHEST PORT 1 VIEW  Result Date: 02/08/2020 CLINICAL DATA:  Hydropneumothorax, chest tube on LEFT EXAM: PORTABLE CHEST 1 VIEW COMPARISON:  Portable exam 1027 hours compared to 02/07/2020 FINDINGS: Pigtail RIGHT thoracostomy tube, partially withdrawn since previous exam with tip projecting at LEFT costal margin. Persistent small LEFT apex pneumothorax. Basilar LEFT pleural effusion. Normal heart size mediastinal contours. RIGHT jugular Port-A-Cath stable tip projecting over SVC. Atelectasis versus infiltrate at RIGHT base. BILATERAL pulmonary nodules, largest a mass at LEFT apex. Bones demineralized. IMPRESSION: Persistent small LEFT hydropneumothorax. LEFT thoracostomy tube has been partially withdrawn with the pigtail now at LEFT costal margin. Electronically Signed   By: Lavonia Dana M.D.   On: 02/08/2020 15:11   DG Chest Port 1V same Day  Result Date: 02/09/2020 CLINICAL DATA:  Shortness of breath, history of metastatic lung carcinoma EXAM: PORTABLE CHEST 1 VIEW COMPARISON:  Film from earlier in the same day. FINDINGS: Cardiac shadow is stable. Left-sided chest tube is again identified. Small left apical pneumothorax is again identified stable in appearance. Stable left upper lobe mass lesion is noted. Nodules are noted in the right lung as well as patchy atelectatic changes in the bases bilaterally. Considerable subcutaneous emphysema is noted. No bony abnormality is seen. Right chest wall  port is noted. IMPRESSION: Stable left apical pneumothorax with chest tube in place. Changes of metastatic disease within the lungs. Electronically Signed   By: Inez Catalina M.D.   On: 02/09/2020 23:23   DG Chest Port 1V same Day  Result Date: 02/09/2020 CLINICAL DATA:  Pneumothorax.  Chest tube placement. EXAM: PORTABLE CHEST 1 VIEW COMPARISON:  February 09, 2020 FINDINGS: There has been interval placement of a left-sided chest tube. There is a small residual left-sided pneumothorax, improved from prior study. The right-sided Port-A-Cath is well position. Bilateral airspace opacities are again noted, not substantially changed from prior study. There is subcutaneous emphysema along the patient's left flank, stable to improved from prior study. The heart size is unchanged. IMPRESSION: 1. Interval placement of a left-sided chest tube. 2. Small residual left-sided pneumothorax, improved from prior study. 3. Otherwise, no significant short interval change. Electronically Signed   By: Constance Holster M.D.   On: 02/09/2020 16:25   DG Chest Port 1V same Day  Result Date: 02/09/2020 CLINICAL DATA:  Chest tube removal EXAM: PORTABLE CHEST 1 VIEW COMPARISON:  02/09/2020 FINDINGS: Further increased size of the left pneumothorax, now noted laterally and at the left apex, approximately 15%. Right  Port-A-Cath remains in place, unchanged. Airspace opacity at the right lung base is unchanged. Heart is borderline in size. Subcutaneous emphysema throughout the left chest wall. IMPRESSION: Enlarging left pneumothorax, now approximately 15%. Otherwise no change. These results will be called to the ordering clinician or representative by the Radiologist Assistant, and communication documented in the PACS or [employer]. Electronically Signed   By: Rolm Baptise M.D.   On: 02/09/2020 15:33     Medical Consultants:    None.  Anti-Infectives:   none  Subjective:    Reino R Roundtree complaining of left-sided pain  he relates to narcotics help with it.  Objective:    Vitals:   02/09/20 1418 02/09/20 2014 02/09/20 2100 02/10/20 0042  BP: 114/76  122/87 133/80  Pulse: 93  98 92  Resp: 20  18 17   Temp: 98.5 F (36.9 C)  98.8 F (37.1 C)   TempSrc: Oral  Oral Oral  SpO2: 94% 93% 92%   Weight:      Height:       SpO2: 92 % O2 Flow Rate (L/min): 2 L/min   Intake/Output Summary (Last 24 hours) at 02/10/2020 0812 Last data filed at 02/09/2020 1300 Gross per 24 hour  Intake 480 ml  Output --  Net 480 ml   Filed Weights   02/08/20 0924  Weight: 91.9 kg    Exam: General exam: In no acute distress. Respiratory system: Good air movement and clear to auscultation. Cardiovascular system: S1 & S2 heard, RRR. No JVD. Gastrointestinal system: Abdomen is nondistended, soft and nontender.  Extremities: No pedal edema. Skin: No rashes, lesions or ulcers Psychiatry: Judgement and insight appear normal. Mood & affect appropriate.    Data Reviewed:    Labs: Basic Metabolic Panel: Recent Labs  Lab 02/07/20 0901 02/07/20 1447  NA 136 136  K 3.6 3.9  CL 102 104  CO2 25 27  GLUCOSE 128* 111*  BUN 12 12  CREATININE 0.67 0.63  CALCIUM 8.5* 8.3*  MG 2.0  --    GFR Estimated Creatinine Clearance: 111.9 mL/min (by C-G formula based on SCr of 0.63 mg/dL). Liver Function Tests: Recent Labs  Lab 02/07/20 0901 02/07/20 1447  AST 29 27  ALT 22 22  ALKPHOS 142* 145*  BILITOT 0.9 0.6  PROT 6.3* 5.5*  ALBUMIN 3.2* 3.1*   No results for input(s): LIPASE, AMYLASE in the last 168 hours. No results for input(s): AMMONIA in the last 168 hours. Coagulation profile No results for input(s): INR, PROTIME in the last 168 hours. COVID-19 Labs  No results for input(s): DDIMER, FERRITIN, LDH, CRP in the last 72 hours.  Lab Results  Component Value Date   SARSCOV2NAA NEGATIVE 02/07/2020   SARSCOV2NAA NEGATIVE 05/05/2019   [place] NEGATIVE 08/01/2018    CBC: Recent Labs  Lab  02/07/20 0901 02/07/20 1447 02/07/20 2210 02/08/20 0450  WBC 8.0 9.5  --  7.1  NEUTROABS 6.7 8.0*  --   --   HGB 12.3* 11.6* 11.2* 10.9*  HCT 38.3* 36.5* 34.9* 33.9*  MCV 100.0 100.6*  --  100.3*  PLT 247 274  --  265   Cardiac Enzymes: No results for input(s): CKTOTAL, CKMB, CKMBINDEX, TROPONINI in the last 168 hours. BNP (last 3 results) No results for input(s): PROBNP in the last 8760 hours. CBG: No results for input(s): GLUCAP in the last 168 hours. D-Dimer: No results for input(s): DDIMER in the last 72 hours. Hgb A1c: No results for input(s): HGBA1C in the last  72 hours. Lipid Profile: No results for input(s): CHOL, HDL, LDLCALC, TRIG, CHOLHDL, LDLDIRECT in the last 72 hours. Thyroid function studies: No results for input(s): TSH, T4TOTAL, T3FREE, THYROIDAB in the last 72 hours.  Invalid input(s): FREET3 Anemia work up: No results for input(s): VITAMINB12, FOLATE, FERRITIN, TIBC, IRON, RETICCTPCT in the last 72 hours. Sepsis Labs: Recent Labs  Lab 02/07/20 0901 02/07/20 1447 02/08/20 0450  WBC 8.0 9.5 7.1   Microbiology Recent Results (from the past 240 hour(s))  SARS Coronavirus 2 by RT PCR (hospital order, performed in [facility] hospital lab) Nasopharyngeal Nasopharyngeal Swab     Status: None   Collection Time: 02/07/20  2:31 PM   Specimen: Nasopharyngeal Swab  Result Value Ref Range Status   SARS Coronavirus 2 NEGATIVE NEGATIVE Final    Comment: (NOTE) SARS-CoV-2 target nucleic acids are NOT DETECTED.  The SARS-CoV-2 RNA is generally detectable in upper and lower respiratory specimens during the acute phase of infection. The lowest concentration of SARS-CoV-2 viral copies this assay can detect is 250 copies / mL. A negative result does not preclude SARS-CoV-2 infection and should not be used as the sole basis for treatment or other patient management decisions.  A negative result may occur with improper specimen collection / handling, submission of  specimen other than nasopharyngeal swab, presence of viral mutation(s) within the areas targeted by this assay, and inadequate number of viral copies (<250 copies / mL). A negative result must be combined with clinical observations, patient history, and epidemiological information.  Fact Sheet for Patients:   StrictlyIdeas.no  Fact Sheet for Healthcare Providers: BankingDealers.co.za  This test is not yet approved or  cleared by the [place] FDA and has been authorized for detection and/or diagnosis of SARS-CoV-2 by FDA under an Emergency Use Authorization (EUA).  This EUA will remain in effect (meaning this test can be used) for the duration of the COVID-19 declaration under Section 564(b)(1) of the Act, 21 U.S.C. section 360bbb-3(b)(1), unless the authorization is terminated or revoked sooner.  Performed at [facility], [address] [place], [name] [address]      Medications:   . albuterol  2.5 mg Nebulization BID  . benzonatate  200 mg Oral TID  . Chlorhexidine Gluconate Cloth  6 each Topical Daily  . chlorpheniramine-HYDROcodone  5 mL Oral Q12H  . escitalopram  10 mg Oral Daily  . gabapentin  300 mg Oral TID  . megestrol  400 mg Oral BID  . pantoprazole  40 mg Oral BID AC   Continuous Infusions:    LOS: 3 days   Charlynne Cousins  [facility]  02/10/2020, 8:12 AM

## 2020-02-10 NOTE — Consult Note (Signed)
[address]       [place] [address]             [phone]      Cardiothoracic Surgery Consultation  Reason for Consult: Recurrent left pneumothorax Referring Physician: Dr. Bethanne Ginger Leonard [name] is an 60 y.o. male.  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  The pigtail catheter was removed on 02/09/2020 by the patient developed recurrent left pneumothorax and a left chest tube was placed that day by Dr. Melvyn Novas at [facility]. He was transferred to [facility] for management.  He has had a persistent air leak and we are consulted.  Past Medical History:  Diagnosis Date  . Cancer [facility])    metastatic lung cancer  . Port-A-Cath in place 05/19/2019   right    Past Surgical History:  Procedure Laterality Date  . CHOLECYSTECTOMY    . ESOPHAGEAL DILATION N/A 06/27/2017   Procedure: ESOPHAGEAL DILATION;  Surgeon: Danie Binder, MD;  Location: [facility];  Service: Endoscopy;  Laterality: N/A;  . ESOPHAGOGASTRODUODENOSCOPY N/A 06/27/2017   Procedure: ESOPHAGOGASTRODUODENOSCOPY (EGD);  Surgeon: Danie Binder, MD;  Location: [facility];  Service: Endoscopy;  Laterality: N/A;  . IR IMAGING GUIDED PORT INSERTION  05/04/2019  . knee sx      Family History  Problem Relation Age of Onset  . Heart disease Father   . Cancer Maternal Aunt   . Cancer Maternal Uncle   . Cancer Paternal Uncle   . Vision loss Maternal Grandfather   . Colon cancer Neg Hx   . Colon polyps Neg Hx     Social History:  reports that he has quit smoking. He has never used smokeless tobacco. He reports current alcohol use. He reports that he does not use drugs.  Allergies: No Known Allergies  Medications:  I have reviewed the patient's current  medications. Prior to Admission:  Medications Prior to Admission  Medication Sig Dispense Refill Last Dose  . ALPRAZolam (XANAX) 0.25 MG tablet Take 1 tablet (0.25 mg total) by mouth 2 (two) times daily as needed for anxiety. 60 tablet 3 02/06/2020 at Unknown time  . DOCEtaxel (TAXOTERE IV) Inject into the vein.     Marland Kitchen escitalopram (LEXAPRO) 10 MG tablet Take 1 tablet (10 mg total) by mouth daily. 30 tablet 2 02/07/2020 at Unknown time  . Gemcitabine HCl (GEMZAR IV) Inject into the vein.     Marland Kitchen HYDROcodone-homatropine (HYCODAN) 5-1.5 MG/5ML syrup Take 10 mLs by mouth every 6 (six) hours as needed for cough. SMARTSIG:1 Teaspoon By Mouth Every 6 Hours PRN (Patient taking differently: Take 10 mLs by mouth every 6 (six) hours as needed for cough.) 480 mL 0 02/07/2020 at Unknown time  . megestrol (MEGACE) 400 MG/10ML suspension Take 10 mLs (400 mg total) by mouth 2 (two) times daily. 480 mL 1 02/06/2020 at Unknown time  . Oxycodone HCl 10 MG TABS Take 0.5 tablets (5 mg total) by mouth every 8 (eight) hours as needed. (Patient taking differently: Take 5 mg by mouth every 8 (eight) hours as needed (severe pain).) 30 tablet 0 More than a month ago  . prochlorperazine (COMPAZINE) 10 MG tablet TAKE (1) TABLET BY MOUTH EVERY (6) HOURS AS NEEDED. (Patient  taking differently: Take 10 mg by mouth every 6 (six) hours as needed for nausea or vomiting.) 30 tablet 0 More than a month ago  . zolpidem (AMBIEN) 10 MG tablet Take 1 tablet (10 mg total) by mouth at bedtime as needed for sleep. 10 tablet 0 02/06/2020 at Unknown time  . aluminum-magnesium hydroxide-simethicone (MAALOX) 062-694-85 MG/5ML SUSP Swish and swallow 5 ml four times daily as needed for mouth sores. 600 mL 2 unk   Scheduled: . albuterol  2.5 mg Nebulization BID  . benzonatate  200 mg Oral TID  . Chlorhexidine Gluconate Cloth  6 each Topical Daily  . chlorpheniramine-HYDROcodone  5 mL Oral Q12H  . escitalopram  10 mg Oral Daily  . gabapentin  300 mg  Oral TID  . megestrol  400 mg Oral BID  . pantoprazole  40 mg Oral BID AC   Continuous:  IOE:[id number] **OR** acetaminophen, albuterol, ALPRAZolam, guaiFENesin-dextromethorphan, HYDROmorphone (DILAUDID) injection, ondansetron **OR** ondansetron (ZOFRAN) IV, oxyCODONE, polyethylene glycol, zolpidem  No results found for this or any previous visit (from the past 48 hour(s)).  DG CHEST PORT 1 VIEW  Result Date: 02/09/2020 CLINICAL DATA:  Follow-up hydropneumothorax. Status post chest tube removal. EXAM: PORTABLE CHEST 1 VIEW COMPARISON:  02/09/2020 FINDINGS: Right chest wall port a catheter is noted with tip at the cavoatrial junction. The left-sided chest tube has been removed. Small left apical pneumothorax persists. Unchanged appearance of gas within the soft tissues of the left chest wall. Masslike opacities within the left upper lobe and right base are unchanged. IMPRESSION: Persistent small left apical pneumothorax status post left-sided chest tube removal. Electronically Signed   By: Kerby Moors M.D.   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 Recent left pneumothorax. EXAM: PORTABLE CHEST 1 VIEW COMPARISON:  Portable chest 02/08/2020 and earlier. FINDINGS: Portable AP upright view at 0816 hours. Left chest tube is in place although there may be side holes outside of the left pleural space (arrow). A small volume of left chest wall gas has increased. Small residual left pneumothorax. No mediastinal shift. Masslike bilateral pulmonary opacities compatible with known metastases. Stable cardiac size and mediastinal contours. Visualized tracheal air column is within normal limits. Stable right chest porta cath. Negative visible bowel gas pattern. Stable visualized osseous structures. IMPRESSION: 1. Left chest tube in place although there may be tube side holes outside of the pleural space. A small volume of  chest wall gas has increased. 2. Stable small residual pneumothorax. 3. Stable pulmonary metastases. Electronically Signed   By: Genevie Ann M.D.   On: 02/09/2020 08:25   DG Chest Port 1V same Day  Result Date: 02/09/2020 CLINICAL DATA:  Shortness of breath, history of metastatic lung carcinoma EXAM: PORTABLE CHEST 1 VIEW COMPARISON:  Film from earlier in the same day. FINDINGS: Cardiac shadow is stable. Left-sided chest tube is again identified. Small left apical pneumothorax is again identified stable in appearance. Stable left upper lobe mass lesion is noted. Nodules are noted in the right lung as well as patchy atelectatic changes in the bases bilaterally. Considerable subcutaneous emphysema is noted. No bony abnormality is seen. Right chest wall port is noted. IMPRESSION: Stable left apical pneumothorax with chest tube in place. Changes of metastatic disease within the lungs. Electronically Signed   By: Inez Catalina M.D.   On: 02/09/2020 23:23   DG Chest Port 1V same Day  Result Date: 02/09/2020 CLINICAL  DATA:  Pneumothorax.  Chest tube placement. EXAM: PORTABLE CHEST 1 VIEW COMPARISON:  February 09, 2020 FINDINGS: There has been interval placement of a left-sided chest tube. There is a small residual left-sided pneumothorax, improved from prior study. The right-sided Port-A-Cath is well position. Bilateral airspace opacities are again noted, not substantially changed from prior study. There is subcutaneous emphysema along the patient's left flank, stable to improved from prior study. The heart size is unchanged. IMPRESSION: 1. Interval placement of a left-sided chest tube. 2. Small residual left-sided pneumothorax, improved from prior study. 3. Otherwise, no significant short interval change. Electronically Signed   By: Constance Holster M.D.   On: 02/09/2020 16:25   DG Chest Port 1V same Day  Result Date: 02/09/2020 CLINICAL DATA:  Chest tube removal EXAM: PORTABLE CHEST 1 VIEW COMPARISON:  02/09/2020  FINDINGS: Further increased size of the left pneumothorax, now noted laterally and at the left apex, approximately 15%. Right Port-A-Cath remains in place, unchanged. Airspace opacity at the right lung base is unchanged. Heart is borderline in size. Subcutaneous emphysema throughout the left chest wall. IMPRESSION: Enlarging left pneumothorax, now approximately 15%. Otherwise no change. These results will be called to the ordering clinician or representative by the Radiologist Assistant, and communication documented in the PACS or [employer]. Electronically Signed   By: Rolm Baptise M.D.   On: 02/09/2020 15:33    Review of Systems  Respiratory: Negative for cough and shortness of breath.   Cardiovascular: Positive for chest pain.       Midline lower substernal  Neurological: Negative for dizziness and syncope.   Blood pressure 122/82, pulse 82, temperature 99 F (37.2 C), temperature source Oral, resp. rate 17, height 5\' 10"  (1.778 m), weight 91.9 kg, SpO2 98 %. Physical Exam Constitutional:      Appearance: He is well-developed.  Cardiovascular:     Rate and Rhythm: Normal rate and regular rhythm.  Pulmonary:     Effort: Pulmonary effort is normal.     Breath sounds: Normal breath sounds.     Comments: Chest tube draining serous fluid with no air leak present with cough.  Subcutaneous emphysema over the left chest. Neurological:     Mental Status: He is alert.   CLINICAL DATA:  60 year old male with history of liposarcoma in the left lower extremity.  EXAM: CT CHEST, ABDOMEN, AND PELVIS WITH CONTRAST  TECHNIQUE: Multidetector CT imaging of the chest, abdomen and pelvis was performed following the standard protocol during bolus administration of intravenous contrast.  CONTRAST:  123mL OMNIPAQUE IOHEXOL 300 MG/ML  SOLN  COMPARISON:  CT the chest, abdomen and pelvis 11/01/2019.  FINDINGS: CT CHEST FINDINGS  Cardiovascular: Heart size is normal. Trace amount of  pericardial fluid and/or thickening, unlikely to be of hemodynamic significance at this time. No associated pericardial calcification. There is aortic atherosclerosis, as well as atherosclerosis of the great vessels of the mediastinum and the coronary arteries, including calcified atherosclerotic plaque in the left main, left anterior descending and right coronary arteries.Right internal jugular single-lumen porta cath with tip terminating at the superior cavoatrial junction.  Mediastinum/Nodes: No pathologically enlarged mediastinal or hilar lymph nodes. Esophagus is unremarkable in appearance. No axillary lymphadenopathy.  Lungs/Pleura: Multiple large pulmonary nodules and masses are again noted in the lungs bilaterally, generally increased in size compared to the prior examination. The largest of these is in the left upper lobe near the apex (axial image 17 of series 2) measuring 5.5 x 5.6 cm, significantly increased from 2.1  x 1.8 cm on the prior examination. This lesion is centrally low-attenuation, suggesting internal areas of necrosis. Small bilateral pleural effusions. New large left pneumothorax with considerable atelectasis in the left lung.  Musculoskeletal: There are no aggressive appearing lytic or blastic lesions noted in the visualized portions of the skeleton.  CT ABDOMEN PELVIS FINDINGS  Hepatobiliary: No suspicious cystic or solid hepatic lesions. No intra or extrahepatic biliary ductal dilatation. Status post cholecystectomy.  Pancreas: No pancreatic mass. No pancreatic ductal dilatation. No pancreatic or peripancreatic fluid collections or inflammatory changes.  Spleen: Unremarkable.  Adrenals/Urinary Tract: In the upper pole the left kidney there is enhancing lesion measuring 1.7 cm in diameter, increased in size compared to the prior study (previously 1.2 cm), concerning for neoplasm. Exophytic 1.2 cm low-attenuation lesion in the lower  pole the left kidney, compatible with a small simple cyst. Right kidney and bilateral adrenal glands are normal in appearance. No hydroureteronephrosis. Urinary bladder is normal in appearance.  Stomach/Bowel: The appearance of the stomach is normal. There is no pathologic dilatation of small bowel or colon. Normal appendix.  Vascular/Lymphatic: Aortic atherosclerosis, without evidence of aneurysm or dissection in the abdominal or pelvic vasculature. No lymphadenopathy noted in the abdomen or pelvis.  Reproductive: Prostate gland and seminal vesicles are unremarkable in appearance.  Other: No significant volume of ascites.  No pneumoperitoneum.  Musculoskeletal: There are no aggressive appearing lytic or blastic lesions noted in the visualized portions of the skeleton.  IMPRESSION: 1. New large left hydropneumothorax. 2. Widespread metastatic disease to the lungs appears progressive compared to the prior study, as above. 3. Enlarging probably enhancing lesion in the upper pole the left kidney concerning for neoplasm, either metastatic or primary. 4. Small right pleural effusion. 5. Aortic atherosclerosis, in addition to left main and 2 vessel coronary artery disease. Please note that although the presence of coronary artery calcium documents the presence of coronary artery disease, the severity of this disease and any potential stenosis cannot be assessed on this non-gated CT examination. Assessment for potential risk factor modification, dietary therapy or pharmacologic therapy may be warranted, if clinically indicated. 6. Additional incidental findings, as above.  Critical Value/emergent results were called by telephone at the time of interpretation on 02/06/2020 at 11:35 am to provider [facility] , who verbally acknowledged these results.   Electronically Signed   By: Vinnie Langton M.D.   On: 02/06/2020 11:36   Assessment/Plan:  This gentleman  has metastatic liposarcoma to both lungs which appears progressive compared to a prior study.  There is a large mass at the right apex.  His spontaneous pneumothorax is certainly related to the malignancy.  There is obvious involvement of the parietal pleural surface.  He would not be a candidate for any surgical procedure.  The air leak appears to stopped at the time of my examination.  I would leave the chest tube to waterseal for couple days before removing it to be sure that the air leak does not recur.  If he develops a recurrent air leak prior to removing the tube then I would consider using talc for pleurodesis.  We will follow up on the patient tomorrow and check his chest x-ray to help with deciding when to remove the chest tube.  Gaye Pollack 02/10/2020, 5:08 PM

## 2020-02-11 DIAGNOSIS — J939 Pneumothorax, unspecified: Secondary | ICD-10-CM | POA: Diagnosis not present

## 2020-02-11 NOTE — Progress Notes (Signed)
   02/11/20 1339  Clinical Encounter Type  Visited With Patient and family together;Health care provider  Visit Type Initial;Spiritual support  Referral From Palliative care team   Chaplain responded to a palliative care consult. Chaplain met with patient and patient's wife. Chaplain facilitated a brief life review. Chaplain's wife shared that she also has recently received a cancer diagnosis that they are evaluating. They also indicated having a good support system of friends, church, and family. Chaplain offered prayer and support.Chaplain introduced spiritual care services. Spiritual care services available as needed.   Jeri Lager, Chaplain

## 2020-02-11 NOTE — Progress Notes (Signed)
  Subjective: No complaints.  Objective: Vital signs in last 24 hours: Temp:  [98.3 F (36.8 C)-98.6 F (37 C)] 98.6 F (37 C) (01/30 1524) Pulse Rate:  [85-99] 85 (01/30 1524) Cardiac Rhythm: Normal sinus rhythm (01/30 1025) Resp:  [13-22] 18 (01/30 1524) BP: (109-115)/(66-75) 113/66 (01/30 1524) SpO2:  [89 %-100 %] 90 % (01/30 1524)  Hemodynamic parameters for last 24 hours:    Intake/Output from previous day: 01/29 0701 - 01/30 0700 In: 720 [P.O.:720] Out: 1730 [Urine:180; Chest Tube:1550] Intake/Output this shift: Total I/O In: 840 [P.O.:840] Out: 540 [Urine:540]  General appearance: alert and cooperative Heart: regular rate and rhythm Lungs: clear to auscultation bilaterally no air leak from chest tube.  Lab Results: No results for input(s): WBC, HGB, HCT, PLT in the last 72 hours. BMET: No results for input(s): NA, K, CL, CO2, GLUCOSE, BUN, CREATININE, CALCIUM in the last 72 hours.  PT/INR: No results for input(s): LABPROT, INR in the last 72 hours. ABG No results found for: PHART, HCO3, TCO2, ACIDBASEDEF, O2SAT CBG (last 3)  No results for input(s): GLUCAP in the last 72 hours.  Assessment/Plan:  Recurrent spontaneous left ptx likely related to metastatic sarcoma to lung. He has not had an air leak for two days on water seal. Will check CXR in am and if no air leak and no ptx, will remove the tube. Discussed with the patient and his wife.   LOS: 4 days    Gaye Pollack 02/11/2020

## 2020-02-11 NOTE — Progress Notes (Signed)
[facility] PROGRESS NOTE    Progress Note  Leonard [name]  IZT:[id number] DOB: [birth year] DOA: 02/07/2020 PCP: Celene Squibb, MD     Brief Narrative:   Leonard [name] is an 60 y.o. 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  Pulmonary was consulted and recommended transfer to [facility] for CT surgery evaluation of the hydropneumothorax there is unlikely to resolve with problems this is associated with a necrotic malignant mass.  02/09/2020: Left-sided chest tube became dislodged removed by general surgeon Dr. Arnoldo Morale. Repeated chest x-ray showed an enlarged left pneumothorax after removal of chest tube. New chest tube placed by Dr. Leonides Schanz and recommended transfer to [facility] for CT surgery evaluation.  Assessment/Plan:   Left-sided hydropneumothorax--associated with necrotic malignant mass Chest tube replaced as it got dislodged and has pneumothorax got worse. Repeated chest x-ray shows stable left apical pneumothorax and chest tube in place. CT surgery was consulted and he is not a candidate for surgical procedure the air leak appears to have stopped.  They recommended to leave the waterseal for couple of days before removing it.  If it recurs they might consider talc pleurodesis.  Chest pain significantly controlled.  Liposarcoma of left lower extremity [facility]) metastatic: His oncologist recommended no further chemotherapy due to progression of disease.  Bilateral rib pain due to lytic lesions: Continue opiates as needed.  Pression and anxiety: Continue Xanax and Lexapro.  Ethics: The patient remains full scope of treatment per oncology however the patient is not a candidate for chemotherapy. Awaiting palliative care meeting. I think the patient does not grasp the severity of his disease nor how advanced his metastatic cancer is.  He  believes he is only in his lung but he has diffuse metastatic disease. I saw him about end-of-life and he refused to go there.    DVT prophylaxis: lovemox Family Communication:none Status is: Inpatient  Remains inpatient appropriate because:Hemodynamically unstable   Dispo: The patient is from: Home              Anticipated d/c is to: Home              Anticipated d/c date is: > 3 days              Patient currently is not medically stable to d/c.   Difficult to place patient No        Code Status:     Code Status Orders  (From admission, onward)         Start     Ordered   02/07/20 2155  Full code  Continuous        02/07/20 2154        Code Status History    Date Active Date Inactive Code Status Order ID Comments User Context   08/01/2018 2150 08/02/2018 2123 Full Code [id number]  Bethena Roys, MD Inpatient   Advance Care Planning Activity        IV Access:    Peripheral IV   Procedures and diagnostic studies:   DG CHEST PORT 1 VIEW  Result Date: 02/09/2020 CLINICAL DATA:  Follow-up hydropneumothorax. Status post chest tube removal. EXAM: PORTABLE CHEST 1 VIEW COMPARISON:  02/09/2020 FINDINGS: Right chest wall port a catheter is noted with tip at the cavoatrial junction. The left-sided chest tube has been removed. Small left apical pneumothorax persists. Unchanged appearance of gas  within the soft tissues of the left chest wall. Masslike opacities within the left upper lobe and right base are unchanged. IMPRESSION: Persistent small left apical pneumothorax status post left-sided chest tube removal. Electronically Signed   By: Kerby Moors M.D.   On: 02/09/2020 12:08   DG Chest Port 1V same Day  Result Date: 02/09/2020 CLINICAL DATA:  Shortness of breath, history of metastatic lung carcinoma EXAM: PORTABLE CHEST 1 VIEW COMPARISON:  Film from earlier in the same day. FINDINGS: Cardiac shadow is stable. Left-sided chest tube is again identified.  Small left apical pneumothorax is again identified stable in appearance. Stable left upper lobe mass lesion is noted. Nodules are noted in the right lung as well as patchy atelectatic changes in the bases bilaterally. Considerable subcutaneous emphysema is noted. No bony abnormality is seen. Right chest wall port is noted. IMPRESSION: Stable left apical pneumothorax with chest tube in place. Changes of metastatic disease within the lungs. Electronically Signed   By: Inez Catalina M.D.   On: 02/09/2020 23:23   DG Chest Port 1V same Day  Result Date: 02/09/2020 CLINICAL DATA:  Pneumothorax.  Chest tube placement. EXAM: PORTABLE CHEST 1 VIEW COMPARISON:  February 09, 2020 FINDINGS: There has been interval placement of a left-sided chest tube. There is a small residual left-sided pneumothorax, improved from prior study. The right-sided Port-A-Cath is well position. Bilateral airspace opacities are again noted, not substantially changed from prior study. There is subcutaneous emphysema along the patient's left flank, stable to improved from prior study. The heart size is unchanged. IMPRESSION: 1. Interval placement of a left-sided chest tube. 2. Small residual left-sided pneumothorax, improved from prior study. 3. Otherwise, no significant short interval change. Electronically Signed   By: Constance Holster M.D.   On: 02/09/2020 16:25   DG Chest Port 1V same Day  Result Date: 02/09/2020 CLINICAL DATA:  Chest tube removal EXAM: PORTABLE CHEST 1 VIEW COMPARISON:  02/09/2020 FINDINGS: Further increased size of the left pneumothorax, now noted laterally and at the left apex, approximately 15%. Right Port-A-Cath remains in place, unchanged. Airspace opacity at the right lung base is unchanged. Heart is borderline in size. Subcutaneous emphysema throughout the left chest wall. IMPRESSION: Enlarging left pneumothorax, now approximately 15%. Otherwise no change. These results will be called to the ordering clinician or  representative by the Radiologist Assistant, and communication documented in the PACS or [employer]. Electronically Signed   By: Rolm Baptise M.D.   On: 02/09/2020 15:33     Medical Consultants:    None.  Anti-Infectives:   none  Subjective:    Leonard [name] relates his pain is controlled.  Objective:    Vitals:   02/10/20 0907 02/10/20 2010 02/10/20 2039 02/10/20 2349  BP:   115/68 113/75  Pulse: 82 86 99 85  Resp: 17 (!) 22 18 13   Temp:   98.3 F (36.8 C)   TempSrc:   Oral   SpO2: 98% 90% 94% 97%  Weight:      Height:       SpO2: 97 % O2 Flow Rate (L/min): 2 L/min   Intake/Output Summary (Last 24 hours) at 02/11/2020 0927 Last data filed at 02/10/2020 1600 Gross per 24 hour  Intake 360 ml  Output 180 ml  Net 180 ml   Filed Weights   02/08/20 0924  Weight: 91.9 kg    Exam: General exam: In no acute distress. Respiratory system: Good air movement and clear to auscultation. Cardiovascular system: S1 &  S2 heard, RRR. No JVD. Gastrointestinal system: Abdomen is nondistended, soft and nontender.  Extremities: No pedal edema. Skin: No rashes, lesions or ulcers Psychiatry: Judgement and insight appear normal. Mood & affect appropriate. Data Reviewed:    Labs: Basic Metabolic Panel: Recent Labs  Lab 02/07/20 0901 02/07/20 1447  NA 136 136  K 3.6 3.9  CL 102 104  CO2 25 27  GLUCOSE 128* 111*  BUN 12 12  CREATININE 0.67 0.63  CALCIUM 8.5* 8.3*  MG 2.0  --    GFR Estimated Creatinine Clearance: 111.9 mL/min (by C-G formula based on SCr of 0.63 mg/dL). Liver Function Tests: Recent Labs  Lab 02/07/20 0901 02/07/20 1447  AST 29 27  ALT 22 22  ALKPHOS 142* 145*  BILITOT 0.9 0.6  PROT 6.3* 5.5*  ALBUMIN 3.2* 3.1*   No results for input(s): LIPASE, AMYLASE in the last 168 hours. No results for input(s): AMMONIA in the last 168 hours. Coagulation profile No results for input(s): INR, PROTIME in the last 168 hours. COVID-19 Labs  No  results for input(s): DDIMER, FERRITIN, LDH, CRP in the last 72 hours.  Lab Results  Component Value Date   SARSCOV2NAA NEGATIVE 02/07/2020   SARSCOV2NAA NEGATIVE 05/05/2019   [place] NEGATIVE 08/01/2018    CBC: Recent Labs  Lab 02/07/20 0901 02/07/20 1447 02/07/20 2210 02/08/20 0450  WBC 8.0 9.5  --  7.1  NEUTROABS 6.7 8.0*  --   --   HGB 12.3* 11.6* 11.2* 10.9*  HCT 38.3* 36.5* 34.9* 33.9*  MCV 100.0 100.6*  --  100.3*  PLT 247 274  --  265   Cardiac Enzymes: No results for input(s): CKTOTAL, CKMB, CKMBINDEX, TROPONINI in the last 168 hours. BNP (last 3 results) No results for input(s): PROBNP in the last 8760 hours. CBG: No results for input(s): GLUCAP in the last 168 hours. D-Dimer: No results for input(s): DDIMER in the last 72 hours. Hgb A1c: No results for input(s): HGBA1C in the last 72 hours. Lipid Profile: No results for input(s): CHOL, HDL, LDLCALC, TRIG, CHOLHDL, LDLDIRECT in the last 72 hours. Thyroid function studies: No results for input(s): TSH, T4TOTAL, T3FREE, THYROIDAB in the last 72 hours.  Invalid input(s): FREET3 Anemia work up: No results for input(s): VITAMINB12, FOLATE, FERRITIN, TIBC, IRON, RETICCTPCT in the last 72 hours. Sepsis Labs: Recent Labs  Lab 02/07/20 0901 02/07/20 1447 02/08/20 0450  WBC 8.0 9.5 7.1   Microbiology Recent Results (from the past 240 hour(s))  SARS Coronavirus 2 by RT PCR (hospital order, performed in [facility] hospital lab) Nasopharyngeal Nasopharyngeal Swab     Status: None   Collection Time: 02/07/20  2:31 PM   Specimen: Nasopharyngeal Swab  Result Value Ref Range Status   SARS Coronavirus 2 NEGATIVE NEGATIVE Final    Comment: (NOTE) SARS-CoV-2 target nucleic acids are NOT DETECTED.  The SARS-CoV-2 RNA is generally detectable in upper and lower respiratory specimens during the acute phase of infection. The lowest concentration of SARS-CoV-2 viral copies this assay can detect is 250 copies / mL. A  negative result does not preclude SARS-CoV-2 infection and should not be used as the sole basis for treatment or other patient management decisions.  A negative result may occur with improper specimen collection / handling, submission of specimen other than nasopharyngeal swab, presence of viral mutation(s) within the areas targeted by this assay, and inadequate number of viral copies (<250 copies / mL). A negative result must be combined with clinical observations, patient history, and  epidemiological information.  Fact Sheet for Patients:   StrictlyIdeas.no  Fact Sheet for Healthcare Providers: BankingDealers.co.za  This test is not yet approved or  cleared by the [place] FDA and has been authorized for detection and/or diagnosis of SARS-CoV-2 by FDA under an Emergency Use Authorization (EUA).  This EUA will remain in effect (meaning this test can be used) for the duration of the COVID-19 declaration under Section 564(b)(1) of the Act, 21 U.S.C. section 360bbb-3(b)(1), unless the authorization is terminated or revoked sooner.  Performed at [facility], [address], [place], [place] [address]      Medications:   . albuterol  2.5 mg Nebulization BID  . benzonatate  200 mg Oral TID  . Chlorhexidine Gluconate Cloth  6 each Topical Daily  . chlorpheniramine-HYDROcodone  5 mL Oral Q12H  . escitalopram  10 mg Oral Daily  . gabapentin  300 mg Oral TID  . megestrol  400 mg Oral BID  . pantoprazole  40 mg Oral BID AC   Continuous Infusions:    LOS: 4 days   Charlynne Cousins  [facility]  02/11/2020, 9:27 AM

## 2020-02-12 ENCOUNTER — Inpatient Hospital Stay (HOSPITAL_COMMUNITY): Payer: 59

## 2020-02-12 DIAGNOSIS — C4922 Malignant neoplasm of connective and soft tissue of left lower limb, including hip: Secondary | ICD-10-CM

## 2020-02-12 DIAGNOSIS — J939 Pneumothorax, unspecified: Secondary | ICD-10-CM

## 2020-02-12 MED FILL — Fentanyl Citrate Preservative Free (PF) Inj 100 MCG/2ML: INTRAMUSCULAR | Qty: 2 | Status: AC

## 2020-02-12 NOTE — Plan of Care (Signed)

## 2020-02-12 NOTE — Progress Notes (Signed)
   NAME:  Leonard [name], MRN:  [id number], DOB:  Oct 25, 1960, LOS: 5 ADMISSION DATE:  02/07/2020, CONSULTATION DATE:  1/27 REFERRING MD:  Robbie Lis, CHIEF COMPLAINT:  L PTX    Brief History:  60 yo male former smoker with Lt lower leg liposarcoma with metastatic disease to lung developed spontaneous Lt pneumothorax.  Had pig tail catheter in ER and PCCM consulted.  Significant Hospital Events:  1/26 Admit, pig tail catheter placed 1/28 pig catheter removed >> developed pneumothorax again and chest tube replaced  Consults:  Palliative care (at [facility]) Cardiothoracic surgery  Procedures:  L pigtain 1/26 >> 1/28 Lt chest tube 1/28 >>   Significant tests:   CT chest/abd/pelvis 1/25 >> multiple pulmonary nodules and masses b/l increased in size compared to October 2021 largest in LUL with central necrosis, small b/l effusions, large Lt pneumothorax, enhancing lesion 1.7 cm increased in size  Interim History / Subjective:  Feeling OK  Objective   Blood pressure 113/72, pulse (Abnormal) 23, temperature 98 F (36.7 C), temperature source Oral, resp. rate 18, height 5\' 10"  (1.778 m), weight 92 kg, SpO2 95 %.    FiO2 (%):  [21 %] 21 %   Intake/Output Summary (Last 24 hours) at 02/12/2020 0859 Last data filed at 02/12/2020 0600 Gross per 24 hour  Intake 1080 ml  Output 1140 ml  Net -60 ml   Filed Weights   02/08/20 0924 02/12/20 0345  Weight: 91.9 kg 92 kg    Examination: General this is a 60 year old white male lying in bed HENT NCAT  pulm dec both bases. Left chest tube w/ NO airleak Card RRR abd soft not tender Ext warm and dry  Neuro intact    Assessment & Plan:   Lt pneumothorax with persistent air leak. Lt lower leg liposarcoma with metastatic disease to lung.  Discussion - followed by Dr. Delton Coombes with oncology in [place] - completed 7 cycles of gemcitabine and docetaxel with continued progression; advised at visit on 02/07/20 that further chemotherapy likely not an  option - he was to have [place] 360 mutation testing and review with sarcoma specialists at [facility] or [facility]  Last seen by cardiothoracic's on 1/30: Now day #3 on waterseal  Portable chest x-ray reviewed it does appear as though the left pneumothorax is slightly larger in size chest tube is in satisfactory position there is residual but slowly improving subcutaneous air bilateral metastasis noted There is no airleak on the CT  Plan/recommendation Keep CT to water seal CVTS to decide on removal of CT or to continue monitoring      Signature:  Erick Colace ACNP-BC [place] Pager # [phone] OR # [phone] if no answer'

## 2020-02-12 NOTE — Progress Notes (Signed)
  Subjective: No complaints.  Objective: Vital signs in last 24 hours: Temp:  [98 F (36.7 C)-98.6 F (37 C)] 98 F (36.7 C) (01/31 0345) Pulse Rate:  [79-95] 79 (01/31 0852) Cardiac Rhythm: Normal sinus rhythm (01/31 0700) Resp:  [16-22] 18 (01/31 0851) BP: (109-117)/(66-73) 113/72 (01/31 0345) SpO2:  [89 %-97 %] 95 % (01/31 0345) FiO2 (%):  [21 %] 21 % (01/31 0851) Weight:  [92 kg] 92 kg (01/31 0345)  Hemodynamic parameters for last 24 hours:    Intake/Output from previous day: 01/30 0701 - 01/31 0700 In: 1080 [P.O.:1080] Out: 1140 [Urine:1140] Intake/Output this shift: No intake/output data recorded.  General appearance: alert and cooperative Lungs: clear to auscultation bilaterally No air leak from chest tube.  Lab Results: No results for input(s): WBC, HGB, HCT, PLT in the last 72 hours. BMET: No results for input(s): NA, K, CL, CO2, GLUCOSE, BUN, CREATININE, CALCIUM in the last 72 hours.  PT/INR: No results for input(s): LABPROT, INR in the last 72 hours. ABG No results found for: PHART, HCO3, TCO2, ACIDBASEDEF, O2SAT CBG (last 3)  No results for input(s): GLUCAP in the last 72 hours.  Assessment/Plan:  CXR today shows slightly more prominent pneumothorax although it could just be his position. It is  small. There is no air leak or tidaling from the chest tube. The subcutaneous air looks improved on CXR and on exam. Will keep chest tube in today but clamped and reevaluate CXR in am.   LOS: 5 days    Gaye Pollack 02/12/2020

## 2020-02-12 NOTE — Progress Notes (Signed)
      [address]       [place] [address]             [phone]         Subjective: No new problems or concerns.   Objective: Vital signs in last 24 hours: Temp:  [98 F (36.7 C)-98.6 F (37 C)] 98 F (36.7 C) (01/31 0345) Pulse Rate:  [84-95] 84 (01/31 0345) Cardiac Rhythm: Normal sinus rhythm (01/30 2103) Resp:  [16-22] 17 (01/31 0345) BP: (109-117)/(66-73) 113/72 (01/31 0345) SpO2:  [89 %-100 %] 95 % (01/31 0345) Weight:  [92 kg] 92 kg (01/31 0345)  Hemodynamic parameters for last 24 hours:    Intake/Output from previous day: 01/30 0701 - 01/31 0700 In: 1080 [P.O.:1080] Out: 1140 [Urine:1140] Intake/Output this shift: No intake/output data recorded.  General appearance: alert, cooperative and no distress Heart: regular rate and rhythm Lungs: On RA, no increase work of breathing.  No fluctuation in the air seal chamber of the Pleurevac with deep breathing or cough. No air leak, no CT drainage recorded and none in the tubing.    Lab Results: No results for input(s): WBC, HGB, HCT, PLT in the last 72 hours. BMET: No results for input(s): NA, K, CL, CO2, GLUCOSE, BUN, CREATININE, CALCIUM in the last 72 hours.  PT/INR: No results for input(s): LABPROT, INR in the last 72 hours. ABG No results found for: PHART, HCO3, TCO2, ACIDBASEDEF, O2SAT CBG (last 3)  No results for input(s): GLUCAP in the last 72 hours.  EXAM: PORTABLE CHEST 1 VIEW  COMPARISON:  Chest x-ray 02/09/2020.  FINDINGS: Right internal jugular single-lumen power porta cath with tip terminating at the superior cavoatrial junction. Left-sided chest tube in position with tip and side port projecting over the lower left hemithorax. Small persistent left pneumothorax, increased slightly in size compared to the prior study. No right-sided pneumothorax. Multiple pulmonary nodules and masses again noted, better demonstrated on prior chest CT 02/06/2020. No definite  acute consolidative airspace disease. No pleural effusions. No evidence of pulmonary edema. Heart size is normal. Upper mediastinal contours are within normal limits. Extensive subcutaneous emphysema in the left chest wall again noted.  IMPRESSION: 1. Support apparatus, as above. 2. Increased size of small left-sided pneumothorax. 3. Multiple pulmonary nodules and masses, similar to prior examinations, presumably reflective of metastatic disease.   Electronically Signed   By: Vinnie Langton M.D.   On: 02/12/2020 08:16   Assessment/Plan:  Pt with metastatic sarcoma presenting with recurrent spontaneous PTX. No air leak with CT to water seal x 3 days. CXR shows stable left lateral subQ air, CT in stable position, increase in size of lateral and apical PTX. Suspicious that the chest tube may be occluded. Will review with Dr. Cyndia Bent before removing the tube.     LOS: 5 days    Antony Odea, [place] [phone] 02/12/2020

## 2020-02-12 NOTE — Progress Notes (Signed)
[facility] PROGRESS NOTE    Progress Note  Leonard [name]  JGG:[id number] DOB: 1960-07-07 DOA: 02/07/2020 PCP: Celene Squibb, MD     Brief Narrative:   Leonard [name] is an 60 y.o. 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  Pulmonary was consulted and recommended transfer to [facility] for CT surgery evaluation of the hydropneumothorax there is unlikely to resolve with problems this is associated with a necrotic malignant mass.  02/09/2020: Left-sided chest tube became dislodged removed by general surgeon Dr. Arnoldo Morale. Repeated chest x-ray showed an enlarged left pneumothorax after removal of chest tube. New chest tube placed by Dr. Leonides Schanz and recommended transfer to [facility] for CT surgery evaluation.  Assessment/Plan:   Left-sided hydropneumothorax--associated with necrotic malignant mass Chest tube replaced as it got dislodged and has pneumothorax got worse. Repeated chest x-ray shows stable left apical pneumothorax and chest tube in place. Appreciate CT surgery's and pulmonary assistance continue management per CT surgery. Chest pain significantly controlled.  Liposarcoma of left lower extremity [facility]) metastatic: His oncologist recommended no further chemotherapy due to progression of disease.  Bilateral rib pain due to lytic lesions: Continue opiates as needed.  Pression and anxiety: Continue Xanax and Lexapro.  Ethics: The patient remains full scope of treatment per oncology however the patient is not a candidate for chemotherapy. Awaiting palliative care meeting. I think the patient does not grasp the severity of his disease nor how advanced his metastatic cancer is.    DVT prophylaxis: lovemox Family Communication:none Status is: Inpatient  Remains inpatient appropriate because:Hemodynamically unstable   Dispo: The  patient is from: Home              Anticipated d/c is to: Home              Anticipated d/c date is: > 3 days              Patient currently is not medically stable to d/c.   Difficult to place patient No   Code Status:     Code Status Orders  (From admission, onward)         Start     Ordered   02/07/20 2155  Full code  Continuous        02/07/20 2154        Code Status History    Date Active Date Inactive Code Status Order ID Comments User Context   08/01/2018 2150 08/02/2018 2123 Full Code [id number]  Bethena Roys, MD Inpatient   Advance Care Planning Activity        IV Access:    Peripheral IV   Procedures and diagnostic studies:   DG Chest Port 1 View  Result Date: 02/12/2020 CLINICAL DATA:  60 year old male with history of pneumothorax status post chest tube placement. EXAM: PORTABLE CHEST 1 VIEW COMPARISON:  Chest x-ray 02/09/2020. FINDINGS: Right internal jugular single-lumen power porta cath with tip terminating at the superior cavoatrial junction. Left-sided chest tube in position with tip and side port projecting over the lower left hemithorax. Small persistent left pneumothorax, increased slightly in size compared to the prior study. No right-sided pneumothorax. Multiple pulmonary nodules and masses again noted, better demonstrated on prior chest CT 02/06/2020. No definite acute consolidative airspace disease. No pleural effusions. No evidence of pulmonary edema. Heart size is normal. Upper mediastinal contours are within normal limits. Extensive subcutaneous  emphysema in the left chest wall again noted. IMPRESSION: 1. Support apparatus, as above. 2. Increased size of small left-sided pneumothorax. 3. Multiple pulmonary nodules and masses, similar to prior examinations, presumably reflective of metastatic disease. Electronically Signed   By: Vinnie Langton M.D.   On: 02/12/2020 08:16     Medical Consultants:    None.  Anti-Infectives:    none  Subjective:    Leonard [name] pain is controlled.  Objective:    Vitals:   02/11/20 2027 02/12/20 0345 02/12/20 0851 02/12/20 0852  BP: 117/69 113/72    Pulse: 89 84 79 79  Resp: 16 17 18    Temp: 98.2 F (36.8 C) 98 F (36.7 C)    TempSrc: Oral Oral    SpO2: 97% 95%    Weight:  92 kg    Height:       SpO2: 95 % O2 Flow Rate (L/min): 2 L/min FiO2 (%): 21 %   Intake/Output Summary (Last 24 hours) at 02/12/2020 1037 Last data filed at 02/12/2020 0600 Gross per 24 hour  Intake 840 ml  Output 840 ml  Net 0 ml   Filed Weights   02/08/20 0924 02/12/20 0345  Weight: 91.9 kg 92 kg    Exam: General exam: In no acute distress. Respiratory system: Good air movement and clear to auscultation, chest tube in place Cardiovascular system: S1 & S2 heard, RRR. No JVD. Gastrointestinal system: Abdomen is nondistended, soft and nontender.  Extremities: No pedal edema. Skin: No rashes, lesions or ulcers Psychiatry: Judgement and insight appear normal. Mood & affect appropriate. Data Reviewed:    Labs: Basic Metabolic Panel: Recent Labs  Lab 02/07/20 0901 02/07/20 1447  NA 136 136  K 3.6 3.9  CL 102 104  CO2 25 27  GLUCOSE 128* 111*  BUN 12 12  CREATININE 0.67 0.63  CALCIUM 8.5* 8.3*  MG 2.0  --    GFR Estimated Creatinine Clearance: 111.9 mL/min (by C-G formula based on SCr of 0.63 mg/dL). Liver Function Tests: Recent Labs  Lab 02/07/20 0901 02/07/20 1447  AST 29 27  ALT 22 22  ALKPHOS 142* 145*  BILITOT 0.9 0.6  PROT 6.3* 5.5*  ALBUMIN 3.2* 3.1*   No results for input(s): LIPASE, AMYLASE in the last 168 hours. No results for input(s): AMMONIA in the last 168 hours. Coagulation profile No results for input(s): INR, PROTIME in the last 168 hours. COVID-19 Labs  No results for input(s): DDIMER, FERRITIN, LDH, CRP in the last 72 hours.  Lab Results  Component Value Date   SARSCOV2NAA NEGATIVE 02/07/2020   SARSCOV2NAA NEGATIVE 05/05/2019    [place] NEGATIVE 08/01/2018    CBC: Recent Labs  Lab 02/07/20 0901 02/07/20 1447 02/07/20 2210 02/08/20 0450  WBC 8.0 9.5  --  7.1  NEUTROABS 6.7 8.0*  --   --   HGB 12.3* 11.6* 11.2* 10.9*  HCT 38.3* 36.5* 34.9* 33.9*  MCV 100.0 100.6*  --  100.3*  PLT 247 274  --  265   Cardiac Enzymes: No results for input(s): CKTOTAL, CKMB, CKMBINDEX, TROPONINI in the last 168 hours. BNP (last 3 results) No results for input(s): PROBNP in the last 8760 hours. CBG: No results for input(s): GLUCAP in the last 168 hours. D-Dimer: No results for input(s): DDIMER in the last 72 hours. Hgb A1c: No results for input(s): HGBA1C in the last 72 hours. Lipid Profile: No results for input(s): CHOL, HDL, LDLCALC, TRIG, CHOLHDL, LDLDIRECT in the last 72 hours. Thyroid function  studies: No results for input(s): TSH, T4TOTAL, T3FREE, THYROIDAB in the last 72 hours.  Invalid input(s): FREET3 Anemia work up: No results for input(s): VITAMINB12, FOLATE, FERRITIN, TIBC, IRON, RETICCTPCT in the last 72 hours. Sepsis Labs: Recent Labs  Lab 02/07/20 0901 02/07/20 1447 02/08/20 0450  WBC 8.0 9.5 7.1   Microbiology Recent Results (from the past 240 hour(s))  SARS Coronavirus 2 by RT PCR (hospital order, performed in [facility] hospital lab) Nasopharyngeal Nasopharyngeal Swab     Status: None   Collection Time: 02/07/20  2:31 PM   Specimen: Nasopharyngeal Swab  Result Value Ref Range Status   SARS Coronavirus 2 NEGATIVE NEGATIVE Final    Comment: (NOTE) SARS-CoV-2 target nucleic acids are NOT DETECTED.  The SARS-CoV-2 RNA is generally detectable in upper and lower respiratory specimens during the acute phase of infection. The lowest concentration of SARS-CoV-2 viral copies this assay can detect is 250 copies / mL. A negative result does not preclude SARS-CoV-2 infection and should not be used as the sole basis for treatment or other patient management decisions.  A negative result may occur  with improper specimen collection / handling, submission of specimen other than nasopharyngeal swab, presence of viral mutation(s) within the areas targeted by this assay, and inadequate number of viral copies (<250 copies / mL). A negative result must be combined with clinical observations, patient history, and epidemiological information.  Fact Sheet for Patients:   StrictlyIdeas.no  Fact Sheet for Healthcare Providers: BankingDealers.co.za  This test is not yet approved or  cleared by the [place] FDA and has been authorized for detection and/or diagnosis of SARS-CoV-2 by FDA under an Emergency Use Authorization (EUA).  This EUA will remain in effect (meaning this test can be used) for the duration of the COVID-19 declaration under Section 564(b)(1) of the Act, 21 U.S.C. section 360bbb-3(b)(1), unless the authorization is terminated or revoked sooner.  Performed at [facility], [address] [place], [place] [address]      Medications:   . albuterol  2.5 mg Nebulization BID  . benzonatate  200 mg Oral TID  . Chlorhexidine Gluconate Cloth  6 each Topical Daily  . chlorpheniramine-HYDROcodone  5 mL Oral Q12H  . escitalopram  10 mg Oral Daily  . gabapentin  300 mg Oral TID  . megestrol  400 mg Oral BID  . pantoprazole  40 mg Oral BID AC   Continuous Infusions:    LOS: 5 days   Charlynne Cousins  [facility]  02/12/2020, 10:37 AM

## 2020-02-13 ENCOUNTER — Inpatient Hospital Stay (HOSPITAL_COMMUNITY): Payer: 59

## 2020-02-13 DIAGNOSIS — C7802 Secondary malignant neoplasm of left lung: Principal | ICD-10-CM

## 2020-02-13 DIAGNOSIS — J939 Pneumothorax, unspecified: Secondary | ICD-10-CM

## 2020-02-13 DIAGNOSIS — J948 Other specified pleural conditions: Secondary | ICD-10-CM | POA: Diagnosis not present

## 2020-02-13 NOTE — Plan of Care (Signed)

## 2020-02-13 NOTE — Progress Notes (Signed)
   NAME:  Leonard [name], MRN:  [id number], DOB:  03-27-1960, LOS: 6 ADMISSION DATE:  02/07/2020, CONSULTATION DATE:  1/27 REFERRING MD:  Robbie Lis, CHIEF COMPLAINT:  L PTX    Brief History:  59 yo male former smoker with Lt lower leg liposarcoma with metastatic disease to lung developed spontaneous Lt pneumothorax.  Had pig tail catheter in ER and PCCM consulted.  Significant Hospital Events:  1/26 Admit, pig tail catheter placed 1/28 pig catheter removed >> developed pneumothorax again and chest tube replaced 1/31 chest tube clamped  Consults:  Palliative care (at [facility]) Cardiothoracic surgery  Procedures:  L pigtain 1/26 >> 1/28 Lt chest tube 1/28 >>   Significant tests:   CT chest/abd/pelvis 1/25 >> multiple pulmonary nodules and masses b/l increased in size compared to October 2021 largest in LUL with central necrosis, small b/l effusions, large Lt pneumothorax, enhancing lesion 1.7 cm increased in size  Interim History / Subjective:  Feels fine No dyspnea, no chest pain Chest tube is clamped  Objective   Blood pressure 140/76, pulse 84, temperature 97.9 F (36.6 C), temperature source Oral, resp. rate 14, height 5\' 10"  (1.778 m), weight 92 kg, SpO2 94 %.        Intake/Output Summary (Last 24 hours) at 02/13/2020 0912 Last data filed at 02/13/2020 6503 Gross per 24 hour  Intake 960 ml  Output 1300 ml  Net -340 ml   Filed Weights   02/08/20 0924 02/12/20 0345  Weight: 91.9 kg 92 kg    Examination:  General:  Resting comfortably in bed HENT: NCAT OP clear PULM: Diminished slightly left base, chest tube in place, normal effort CV: RRR, no mgr GI: BS+, soft, nontender MSK: normal bulk and tone Neuro: awake, alert, no distress, MAEW  2/1 CXR > pneumothorax improved, LUL mass, sub q emphysema better  Assessment & Plan:   Lt pneumothorax with persistent air leak Lt lower leg liposarcoma with metastatic disease to lung  Discussion - followed by Dr. Delton Coombes with  oncology in [place] - completed 7 cycles of gemcitabine and docetaxel with continued progression; advised at visit on 02/07/20 that further chemotherapy likely not an option - he was to have Pearson 360 mutation testing and review with sarcoma specialists at [facility] or [facility]  Discussion: pneumothorax and sub q emphysema improved with tube clamped, he is asymptomatic  Plan/recommendation Would remove chest tube, but will defer to TCTS   PCCM available PRN    Signature:  Roselie Awkward, MD [place] [facility] Pager: [phone] Cell: [phone] If no response, call [phone]

## 2020-02-13 NOTE — Discharge Instructions (Signed)
Pneumothorax A pneumothorax is commonly called a collapsed lung. It is a condition in which air leaks from a lung and builds up between the thin layer of tissue that covers the lungs (visceral pleura) and the interior wall of the chest cavity (parietal pleura). The air gets trapped outside the lung, between the lung and the chest wall (pleural space). The air takes up space and prevents the lung from fully expanding. This condition sometimes occurs suddenly with no apparent cause. The buildup of air may be small or large. A small pneumothorax may go away on its own. A large pneumothorax will require treatment and hospitalization. What are the causes? This condition may be caused by:  Trauma and injury to the chest wall.  Surgery and other medical procedures.  A complication of an underlying lung problem, especially chronic obstructive pulmonary disease (COPD) or emphysema. Sometimes the cause of this condition is not known. What increases the risk? You are more likely to develop this condition if:  You have an underlying lung problem.  You smoke.  You are 63-68 years old, male, tall, and underweight.  You have a personal or family history of pneumothorax.  You have an eating disorder (anorexia nervosa). This condition can also happen quickly, even in people with no history of lung problems. What are the signs or symptoms? Sometimes a pneumothorax will have no symptoms. When symptoms are present, they can include:  Chest pain.  Shortness of breath.  Increased rate of breathing.  Bluish color to your lips or skin (cyanosis). How is this diagnosed? This condition may be diagnosed by:  A medical history and physical exam.  A chest X-ray, chest CT scan, or ultrasound. How is this treated? Treatment depends on how severe your condition is. The goal of treatment is to remove the extra air and allow your lung to expand back to its normal size.  For a small pneumothorax: ? No  treatment may be needed. ? Extra oxygen is sometimes used to make it go away more quickly.  For a large pneumothorax or one that is causing symptoms, a procedure is done to release the air from around your lungs. To do this, a health care provider may use: ? A needle with a syringe. This is used to suck air from a pleural space where no additional leakage is taking place. ? A chest tube. This is used to suck air where there is ongoing leakage into the pleural space. The chest tube may need to remain in place for several days until the air leak has healed.  In more severe cases, surgery may be needed to repair the damage that is causing the leak.  If you have multiple pneumothorax episodes or have an air leak that will not heal, a procedure called a pleurodesis may be done. A medicine is placed in the pleural space to irritate the tissues around the lung so that the lung will stick to the chest wall, seal any leaks, and stop any buildup of air in that space. If you have an underlying lung problem, severe symptoms, or a large pneumothorax you will usually need to stay in the hospital.   Follow these instructions at home: Lifestyle  Do not use any products that contain nicotine or tobacco, such as cigarettes and e-cigarettes. These are major risk factors in pneumothorax. If you need help quitting, ask your health care provider.  Do not lift anything that is heavier than 10 lb (4.5 kg), or the limit that your  health care provider tells you, until he or she says that it is safe.  Avoid activities that take a lot of effort (strenuous) for as long as told by your health care provider.  Return to your normal activities as told by your health care provider. Ask your health care provider what activities are safe for you.  Do not fly in an airplane or scuba dive until your health care provider says it is okay. General instructions  Take over-the-counter and prescription medicines only as told by your  health care provider.  If a cough or pain makes it difficult for you to sleep at night, try sleeping in a semi-upright position in a recliner or by using 2 or 3 pillows.  If you had a chest tube and it was removed, ask your health care provider when you can remove the bandage (dressing). While the dressing is in place, do not allow it to get wet.  Keep all follow-up visits as told by your health care provider. This is important. Contact a health care provider if:  You cough up thick mucus (sputum) that is yellow or green in color.  You were treated with a chest tube, and you have redness, increasing pain, or discharge at the site where it was placed. Get help right away if:  You have increasing chest pain or shortness of breath.  You have a cough that will not go away.  You begin coughing up blood.  You have pain that is getting worse or is not controlled with medicines.  The site where your chest tube was located opens up.  You feel air coming out of the site where the chest tube was placed.  You have a fever or persistent symptoms for more than 2-3 days. These symptoms may represent a serious problem that is an emergency. Do not wait to see if the symptoms will go away. Get medical help right away. Call your local emergency services (911 in the [place] Do not drive yourself to the hospital. Summary  A pneumothorax, commonly called a collapsed lung, is a condition in which air leaks from a lung and gets trapped between the lung and the chest wall (pleural space).  The buildup of air may be small or large. A small pneumothorax may go away on its own. A large pneumothorax will require treatment and hospitalization.  Treatment for this condition depends on how severe the pneumothorax is. The goal of treatment is to remove the extra air and allow the lung to expand back to its normal size. This information is not intended to replace advice given to you by your health care provider.  Make sure you discuss any questions you have with your health care provider. Document Revised: 08/31/2019 Document Reviewed: 08/31/2019 Elsevier Patient Education  2021 [employer].

## 2020-02-13 NOTE — Plan of Care (Signed)
  Problem: Education: Goal: Knowledge of General Education information will improve Description: Including pain rating scale, medication(s)/side effects and non-pharmacologic comfort measures Outcome: Progressing   Problem: Clinical Measurements: Goal: Respiratory complications will improve Outcome: Progressing   Problem: Activity: Goal: Risk for activity intolerance will decrease Outcome: Progressing   

## 2020-02-13 NOTE — Progress Notes (Addendum)
      [address]       [place] [address]             [phone]           Subjective: Patient without breathing complaints this am. He feels constipated but does not want a laxative yet.  Objective: Vital signs in last 24 hours: Temp:  [97.9 F (36.6 C)-98.7 F (37.1 C)] 97.9 F (36.6 C) (02/01 0354) Pulse Rate:  [79-92] 84 (02/01 0406) Cardiac Rhythm: Normal sinus rhythm (01/31 1900) Resp:  [14-20] 14 (02/01 0406) BP: (121-140)/(74-90) 140/76 (02/01 0354) SpO2:  [90 %-98 %] 94 % (02/01 0406) FiO2 (%):  [21 %] 21 % (01/31 0851)     Intake/Output from previous day: 01/31 0701 - 02/01 0700 In: 720 [P.O.:720] Out: 1300 [Urine:1300]   Physical Exam:  Cardiovascular: RRR Pulmonary: Clear to auscultation on the right and slightly diminished left apex Abdomen: Soft, non tender, protuberant,bowel sounds present. Wounds: Dressing is clean and dry.   Chest Tube: to water seal, clamped. No air leak when unclamped  Lab Results: CBC:No results for input(s): WBC, HGB, HCT, PLT in the last 72 hours. BMET: No results for input(s): NA, K, CL, CO2, GLUCOSE, BUN, CREATININE, CALCIUM in the last 72 hours.  PT/INR: No results for input(s): LABPROT, INR in the last 72 hours. ABG:  INR: Will add last result for INR, ABG once components are confirmed Will add last 4 CBG results once components are confirmed  Assessment/Plan: Patient with metastatic sarcoma who presented with recurrent spontaneous  Chest tube is to waterseal,clamped.  No air leak noted after unclamped. CXR this am appears to show tiny, residual left apical pneumothorax, stable left lateral subcutaneous emphysema,  and persistent bilateral pulmonary masses. Hope to remove chest tube. Check CXR in am.  Donielle M ZimmermanPA-C 02/13/2020,7:50 AM [phone]   Chart reviewed, patient examined, agree with above. Plan to remove chest tube in am if no air leak and CXR unchanged.

## 2020-02-13 NOTE — Progress Notes (Signed)
[facility] PROGRESS NOTE    Progress Note  Rastus Borton Higashi  ZOX:[id number] DOB: August 20, 1960 DOA: 02/07/2020 PCP: Celene Squibb, MD     Brief Narrative:   Rambo Sarafian Kunka is an 60 y.o. 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  Pulmonary was consulted and recommended transfer to [facility] for CT surgery evaluation of the hydropneumothorax there is unlikely to resolve with problems this is associated with a necrotic malignant mass.  02/09/2020: Left-sided chest tube became dislodged removed by general surgeon Dr. Arnoldo Morale. Repeated chest x-ray showed an enlarged left pneumothorax after removal of chest tube. New chest tube placed by Dr. Leonides Schanz and recommended transfer to [facility] for CT surgery evaluation.  Assessment/Plan:   Left-sided hydropneumothorax--associated with necrotic malignant mass Chest tube replaced as it got dislodged and has pneumothorax got worse. Repeated chest x-ray shows appears to be showing a small residual left apical pneumothorax. CT surgery recommended to repeat chest x-ray in the morning  Liposarcoma of left lower extremity [facility]) metastatic: His oncologist recommended no further chemotherapy due to progression of disease.  Bilateral rib pain due to lytic lesions: Continue opiates as needed.  Pression and anxiety: Continue Xanax and Lexapro.  Ethics: The patient remains full scope of treatment per oncology however the patient is not a candidate for chemotherapy. Awaiting palliative care meeting. I think the patient does not grasp the severity of his disease nor how advanced his metastatic cancer is.    DVT prophylaxis: lovemox Family Communication:none Status is: Inpatient  Remains inpatient appropriate because:Hemodynamically unstable   Dispo: The patient is from: Home              Anticipated d/c is to:  Home              Anticipated d/c date is: > 3 days              Patient currently is not medically stable to d/c.   Difficult to place patient No   Code Status:     Code Status Orders  (From admission, onward)         Start     Ordered   02/07/20 2155  Full code  Continuous        02/07/20 2154        Code Status History    Date Active Date Inactive Code Status Order ID Comments User Context   08/01/2018 2150 08/02/2018 2123 Full Code [id number]  Bethena Roys, MD Inpatient   Advance Care Planning Activity        IV Access:    Peripheral IV   Procedures and diagnostic studies:   DG Chest 2 View  Result Date: 02/13/2020 CLINICAL DATA:  Chest tube. EXAM: CHEST - 2 VIEW COMPARISON:  02/12/2020. FINDINGS: Left chest tube in stable position. Left pneumothorax improved from prior exam with tiny residual left apical pneumothorax. Persistent bilateral pulmonary masses again noted. Small right pleural effusion. Heart size stable. Degenerative change thoracic spine. IMPRESSION: 1. Left chest tube in stable position. Left pneumothorax improved from prior exam with tiny residual left apical pneumothorax. 2. Persistent bilateral pulmonary masses again noted. Small right pleural effusion. Electronically Signed   By: Marcello Moores  Register   On: 02/13/2020 07:00   DG Chest Port 1 View  Result Date: 02/12/2020 CLINICAL DATA:  60 year old male with history of pneumothorax status post chest tube placement.  EXAM: PORTABLE CHEST 1 VIEW COMPARISON:  Chest x-ray 02/09/2020. FINDINGS: Right internal jugular single-lumen power porta cath with tip terminating at the superior cavoatrial junction. Left-sided chest tube in position with tip and side port projecting over the lower left hemithorax. Small persistent left pneumothorax, increased slightly in size compared to the prior study. No right-sided pneumothorax. Multiple pulmonary nodules and masses again noted, better demonstrated on prior chest CT  02/06/2020. No definite acute consolidative airspace disease. No pleural effusions. No evidence of pulmonary edema. Heart size is normal. Upper mediastinal contours are within normal limits. Extensive subcutaneous emphysema in the left chest wall again noted. IMPRESSION: 1. Support apparatus, as above. 2. Increased size of small left-sided pneumothorax. 3. Multiple pulmonary nodules and masses, similar to prior examinations, presumably reflective of metastatic disease. Electronically Signed   By: Vinnie Langton M.D.   On: 02/12/2020 08:16     Medical Consultants:    None.  Anti-Infectives:   none  Subjective:    Grantley R Haley pain is controlled.  Objective:    Vitals:   02/13/20 0003 02/13/20 0005 02/13/20 0354 02/13/20 0406  BP: 121/74 121/74 140/76   Pulse: 92 91 81 84  Resp: 17 20 20 14   Temp: 98.7 F (37.1 C)  97.9 F (36.6 C)   TempSrc: Oral  Oral   SpO2: 96% 90% 98% 94%  Weight:      Height:       SpO2: 94 % O2 Flow Rate (L/min): 2 L/min FiO2 (%): 21 %   Intake/Output Summary (Last 24 hours) at 02/13/2020 0937 Last data filed at 02/13/2020 0906 Gross per 24 hour  Intake 720 ml  Output 1300 ml  Net -580 ml   Filed Weights   02/08/20 0924 02/12/20 0345  Weight: 91.9 kg 92 kg    Exam: General exam: In no acute distress. Respiratory system: Good air movement and clear to auscultation. Cardiovascular system: S1 & S2 heard, RRR. No JVD. Gastrointestinal system: Abdomen is nondistended, soft and nontender.  Extremities: No pedal edema. Skin: No rashes, lesions or ulcers Psychiatry: Judgement and insight appear normal. Mood & affect appropriate. Data Reviewed:    Labs: Basic Metabolic Panel: Recent Labs  Lab 02/07/20 0901 02/07/20 1447  NA 136 136  K 3.6 3.9  CL 102 104  CO2 25 27  GLUCOSE 128* 111*  BUN 12 12  CREATININE 0.67 0.63  CALCIUM 8.5* 8.3*  MG 2.0  --    GFR Estimated Creatinine Clearance: 111.9 mL/min (by C-G formula based on SCr  of 0.63 mg/dL). Liver Function Tests: Recent Labs  Lab 02/07/20 0901 02/07/20 1447  AST 29 27  ALT 22 22  ALKPHOS 142* 145*  BILITOT 0.9 0.6  PROT 6.3* 5.5*  ALBUMIN 3.2* 3.1*   No results for input(s): LIPASE, AMYLASE in the last 168 hours. No results for input(s): AMMONIA in the last 168 hours. Coagulation profile No results for input(s): INR, PROTIME in the last 168 hours. COVID-19 Labs  No results for input(s): DDIMER, FERRITIN, LDH, CRP in the last 72 hours.  Lab Results  Component Value Date   SARSCOV2NAA NEGATIVE 02/07/2020   SARSCOV2NAA NEGATIVE 05/05/2019   [place] NEGATIVE 08/01/2018    CBC: Recent Labs  Lab 02/07/20 0901 02/07/20 1447 02/07/20 2210 02/08/20 0450  WBC 8.0 9.5  --  7.1  NEUTROABS 6.7 8.0*  --   --   HGB 12.3* 11.6* 11.2* 10.9*  HCT 38.3* 36.5* 34.9* 33.9*  MCV 100.0 100.6*  --  100.3*  PLT 247 274  --  265   Cardiac Enzymes: No results for input(s): CKTOTAL, CKMB, CKMBINDEX, TROPONINI in the last 168 hours. BNP (last 3 results) No results for input(s): PROBNP in the last 8760 hours. CBG: No results for input(s): GLUCAP in the last 168 hours. D-Dimer: No results for input(s): DDIMER in the last 72 hours. Hgb A1c: No results for input(s): HGBA1C in the last 72 hours. Lipid Profile: No results for input(s): CHOL, HDL, LDLCALC, TRIG, CHOLHDL, LDLDIRECT in the last 72 hours. Thyroid function studies: No results for input(s): TSH, T4TOTAL, T3FREE, THYROIDAB in the last 72 hours.  Invalid input(s): FREET3 Anemia work up: No results for input(s): VITAMINB12, FOLATE, FERRITIN, TIBC, IRON, RETICCTPCT in the last 72 hours. Sepsis Labs: Recent Labs  Lab 02/07/20 0901 02/07/20 1447 02/08/20 0450  WBC 8.0 9.5 7.1   Microbiology Recent Results (from the past 240 hour(s))  SARS Coronavirus 2 by RT PCR (hospital order, performed in [facility] hospital lab) Nasopharyngeal Nasopharyngeal Swab     Status: None   Collection Time:  02/07/20  2:31 PM   Specimen: Nasopharyngeal Swab  Result Value Ref Range Status   SARS Coronavirus 2 NEGATIVE NEGATIVE Final    Comment: (NOTE) SARS-CoV-2 target nucleic acids are NOT DETECTED.  The SARS-CoV-2 RNA is generally detectable in upper and lower respiratory specimens during the acute phase of infection. The lowest concentration of SARS-CoV-2 viral copies this assay can detect is 250 copies / mL. A negative result does not preclude SARS-CoV-2 infection and should not be used as the sole basis for treatment or other patient management decisions.  A negative result may occur with improper specimen collection / handling, submission of specimen other than nasopharyngeal swab, presence of viral mutation(s) within the areas targeted by this assay, and inadequate number of viral copies (<250 copies / mL). A negative result must be combined with clinical observations, patient history, and epidemiological information.  Fact Sheet for Patients:   StrictlyIdeas.no  Fact Sheet for Healthcare Providers: BankingDealers.co.za  This test is not yet approved or  cleared by the [place] FDA and has been authorized for detection and/or diagnosis of SARS-CoV-2 by FDA under an Emergency Use Authorization (EUA).  This EUA will remain in effect (meaning this test can be used) for the duration of the COVID-19 declaration under Section 564(b)(1) of the Act, 21 U.S.C. section 360bbb-3(b)(1), unless the authorization is terminated or revoked sooner.  Performed at [facility], [address] [place], [place] [address]      Medications:   . benzonatate  200 mg Oral TID  . Chlorhexidine Gluconate Cloth  6 each Topical Daily  . chlorpheniramine-HYDROcodone  5 mL Oral Q12H  . escitalopram  10 mg Oral Daily  . gabapentin  300 mg Oral TID  . megestrol  400 mg Oral BID  . pantoprazole  40 mg Oral BID AC   Continuous Infusions:    LOS: 6  days   Charlynne Cousins  [facility]  02/13/2020, 9:37 AM

## 2020-02-13 NOTE — Progress Notes (Addendum)
Palliative:  HPI: 60 y.o.malewith past medical history of liposarcoma with mets to lung/kidney/likely bone and follows with Dr. Benjamine Mola on 1/26/2022with difficulty breathing due to hydropneumothorax.Chest tube was inserted in ED.Chest tube dislodged so was removed and monitored but required chest tube replacement 02/09/20.   I met today with Leonard [name]. No family at bedside. Josseline, PA is following with me today and participated in conversation as well. Leonard [name] is known to me from previous visit at [facility] prior to transfer to [facility]. We had a good discussion today of his cancer, progression, and expectations moving forward. Leonard [name] has good understanding of his poor prognosis. He was not aware of cancer in his bone in his ribs but when I share this with him he reports he is not surprised as he knows his cancer is progressing and growing.   Leonard [name] is able to share with [place] his faith and looking forward to being in [place] although he will miss his family. He has made peace with his poor prognosis and that his time is limited but his main concern now is spending time with his family and making sure his wife is set up to be alright when he is gone. They are hoping to learn more about her diagnosis and expectations in the near future. He also shares a life review and how he has tried to do his part to be a good person and help people. He also inquires about organ donation and I shared with him that every person gets referred to donor services and screened and if they are found to be a donor candidate for anything they would contact family to decide if this is something they want or not. He thinks he would want to donate what he can and I encouraged him to share his wishes with his family.   I did discuss with Leonard [name] further that having those hard conversations with his wife regarding his wishes when he worsens is also something he can do to care for her and protect her from  being the one to carry the weight of those decisions for him. He understands. He shares that he feels fairly certain that he desires DNR but he would want to discuss this with his wife first and with her new diagnosis and his hospitalization he just has not had a good time to do so. This will likely not happen until he is discharged.   All questions/concerns addressed. Emotional support provided.   Exam: Alert, oriented. No distress. Breathing regular, unlabored. + dry cough. L CT in place. Abd flat. Moves all extremities.   Plan: - Would benefit from outpatient palliative to follow.  - He expresses desire for DNR and organ donation but would like to speak with wife before making any final decisions. This conversation is most likely to occur after discharge so I recommend outpatient palliative to follow.    [place], NP Palliative Medicine Team Pager [phone] (Please see [email or web address] for schedule) Team Phone [phone]    Greater than 50%  of this time was spent counseling and coordinating care related to the above assessment and plan

## 2020-02-14 ENCOUNTER — Inpatient Hospital Stay (HOSPITAL_COMMUNITY): Payer: 59

## 2020-02-14 MED ORDER — LACTULOSE 10 GM/15ML PO SOLN
20.0000 g | Freq: Once | ORAL | Status: AC
  Administered 2020-02-14: 20 g via ORAL
  Filled 2020-02-14: qty 30

## 2020-02-14 NOTE — Progress Notes (Signed)
PROGRESS NOTE    Leonard [name]  XQJ:[id number] DOB: 1960-10-06 DOA: 02/07/2020 PCP: Celene Squibb, MD   Brief Narrative:  HPI on 02/07/2020 by Dr. Burgess Amor Frayre is a 60 y.o. male with medical history significant for lung cancer.  Patient was reffered to the ED after outpatient CT done yesterday showed a large pneumothorax. Worsening of his difficulty breathing and wheezing over the past few days.  He also reported some pain to the left flank.  He denies vomiting, but reports poor oral intake.  Reports intermittent diarrhea.  Patient had CT chest and abdomen with contrast yesterday that showed hydropneumothorax.  With mild spread metastatic disease to the lungs that appear to have progressed from prior study.  Also an enlarging probably enhancing lesion to the left upper pole of the left kidney concerning for neoplasm metastatic or primary. Patient subsequently followed up with his oncologist today and was referred to the ED.  Interim history Patient was admitted with a left-sided hydropneumothorax requiring a chest tube.  Pulmonology as well as cardiothoracic surgery consulted and appreciated. Assessment & Plan   Left-sided hydropneumothorax associated with necrotic malignant mass -Patient with chest tube in place.  Chest tube did get dislodged on 02/09/2020 and pneumothorax did worsen.  New chest tube was placed by Dr. Leonides Schanz. -Pulmonology as well as cardiothoracic surgery consulted and appreciated -Chest tube has been set to waterseal and clamped.  No air leaks are noted. -Chest x-ray today shows tiny, residual left apical pneumothorax, stable left lateral subcutaneous emphysema, persistent bilateral pulmonary masses -Cardiothoracic surgery may remove chest tube today  Liposarcoma of the left lower extremity, metastatic -Oncologist recommended no further chemotherapy due to progression of disease  Bilateral rib pain due to lytic lesions -Continue Dilaudid  Depression  and anxiety -Continue Lexapro and Xanax  Goals of care -Palliative care consulted and appreciated -Currently patient is full code   DVT Prophylaxis  SCDs  Code Status: Full  Family Communication: None at bedside  Disposition Plan:  Status is: Inpatient  Remains inpatient appropriate because:Continues to have chest tube. Pending cardiothoracic recommendations.    Dispo: The patient is from: Home              Anticipated d/c is to: Home              Anticipated d/c date is: 3 days              Patient currently is not medically stable to d/c.   Difficult to place patient No   Consultants Pulmonology Cardiothoracic surgery  Procedures  Chest tube placement  Antibiotics   Anti-infectives (From admission, onward)   None      Subjective:   Leonard [name] seen and examined today.  Complains of some pain particularly on the left side especially when he moves.  Denies current chest pain, abdominal pain, nausea or vomiting, dizziness or headache.  Does have some shortness of breath.   Objective:   Vitals:   02/14/20 1244 02/14/20 1500 02/14/20 1508 02/14/20 1512  BP: 122/79     Pulse: 82 80 80 81  Resp: 15 19 (!) 21 19  Temp: 98.2 F (36.8 C)     TempSrc: Oral     SpO2: 96% 94% 94% 94%  Weight:      Height:        Intake/Output Summary (Last 24 hours) at 02/14/2020 1516 Last data filed at 02/14/2020 0440 Gross per 24 hour  Intake -  Output  1600 ml  Net -1600 ml   Filed Weights   02/08/20 0924 02/12/20 0345  Weight: 91.9 kg 92 kg    Exam  General: Well developed, chronically ill-appearing, NAD  HEENT: NCAT,  mucous membranes moist.   Cardiovascular: S1 S2 auscultated, no rubs, murmurs or gallops. Regular rate and rhythm.  Chest tube placed left  Respiratory: Diminished on the left, otherwise clear  Abdomen: Soft, nontender, nondistended, + bowel sounds  Extremities: warm dry without cyanosis clubbing or edema  Neuro: AAOx3, nonfocal  Skin: Without  rashes exudates or nodules, multiple tattoos  Psych: appropriate mood and affect, pleasant   Data Reviewed: I have personally reviewed following labs and imaging studies  CBC: Recent Labs  Lab 02/07/20 2210 02/08/20 0450  WBC  --  7.1  HGB 11.2* 10.9*  HCT 34.9* 33.9*  MCV  --  100.3*  PLT  --  465   Basic Metabolic Panel: No results for input(s): NA, K, CL, CO2, GLUCOSE, BUN, CREATININE, CALCIUM, MG, PHOS in the last 168 hours. GFR: Estimated Creatinine Clearance: 111.9 mL/min (by C-G formula based on SCr of 0.63 mg/dL). Liver Function Tests: No results for input(s): AST, ALT, ALKPHOS, BILITOT, PROT, ALBUMIN in the last 168 hours. No results for input(s): LIPASE, AMYLASE in the last 168 hours. No results for input(s): AMMONIA in the last 168 hours. Coagulation Profile: No results for input(s): INR, PROTIME in the last 168 hours. Cardiac Enzymes: No results for input(s): CKTOTAL, CKMB, CKMBINDEX, TROPONINI in the last 168 hours. BNP (last 3 results) No results for input(s): PROBNP in the last 8760 hours. HbA1C: No results for input(s): HGBA1C in the last 72 hours. CBG: No results for input(s): GLUCAP in the last 168 hours. Lipid Profile: No results for input(s): CHOL, HDL, LDLCALC, TRIG, CHOLHDL, LDLDIRECT in the last 72 hours. Thyroid Function Tests: No results for input(s): TSH, T4TOTAL, FREET4, T3FREE, THYROIDAB in the last 72 hours. Anemia Panel: No results for input(s): VITAMINB12, FOLATE, FERRITIN, TIBC, IRON, RETICCTPCT in the last 72 hours. Urine analysis: No results found for: COLORURINE, APPEARANCEUR, LABSPEC, PHURINE, GLUCOSEU, HGBUR, BILIRUBINUR, KETONESUR, PROTEINUR, UROBILINOGEN, NITRITE, LEUKOCYTESUR Sepsis Labs: @LABRCNTIP (procalcitonin:4,lacticidven:4)  ) Recent Results (from the past 240 hour(s))  SARS Coronavirus 2 by RT PCR (hospital order, performed in [facility] hospital lab) Nasopharyngeal Nasopharyngeal Swab     Status: None   Collection  Time: 02/07/20  2:31 PM   Specimen: Nasopharyngeal Swab  Result Value Ref Range Status   SARS Coronavirus 2 NEGATIVE NEGATIVE Final    Comment: (NOTE) SARS-CoV-2 target nucleic acids are NOT DETECTED.  The SARS-CoV-2 RNA is generally detectable in upper and lower respiratory specimens during the acute phase of infection. The lowest concentration of SARS-CoV-2 viral copies this assay can detect is 250 copies / mL. A negative result does not preclude SARS-CoV-2 infection and should not be used as the sole basis for treatment or other patient management decisions.  A negative result may occur with improper specimen collection / handling, submission of specimen other than nasopharyngeal swab, presence of viral mutation(s) within the areas targeted by this assay, and inadequate number of viral copies (<250 copies / mL). A negative result must be combined with clinical observations, patient history, and epidemiological information.  Fact Sheet for Patients:   StrictlyIdeas.no  Fact Sheet for Healthcare Providers: BankingDealers.co.za  This test is not yet approved or  cleared by the [place] FDA and has been authorized for detection and/or diagnosis of SARS-CoV-2 by FDA under an Emergency  Use Authorization (EUA).  This EUA will remain in effect (meaning this test can be used) for the duration of the COVID-19 declaration under Section 564(b)(1) of the Act, 21 U.S.C. section 360bbb-3(b)(1), unless the authorization is terminated or revoked sooner.  Performed at [facility], [address] [place], [place] [address]       Radiology Studies: DG Chest 2 View  Result Date: 02/14/2020 CLINICAL DATA:  Pneumothorax EXAM: CHEST - 2 VIEW COMPARISON:  Chest x-ray 02/13/2020 FINDINGS: Right chest wall Port-A-Cath and left chest tube in stable appropriate position. The heart size and mediastinal contours are within normal limits. Persistent  small apical left pneumothorax. Persistent bilateral pulmonary masses again noted. Possible trace right pleural effusion. No definite left pleural effusion. No acute osseous abnormality. IMPRESSION: 1. Grossly stable small left apical pneumothorax. 2. Trace right pleural effusion. 3. Persistent bilateral pulmonary masses. 4. Lines and tubes in stable position. Electronically Signed   By: Iven Finn M.D.   On: 02/14/2020 06:54   DG Chest 2 View  Result Date: 02/13/2020 CLINICAL DATA:  Chest tube. EXAM: CHEST - 2 VIEW COMPARISON:  02/12/2020. FINDINGS: Left chest tube in stable position. Left pneumothorax improved from prior exam with tiny residual left apical pneumothorax. Persistent bilateral pulmonary masses again noted. Small right pleural effusion. Heart size stable. Degenerative change thoracic spine. IMPRESSION: 1. Left chest tube in stable position. Left pneumothorax improved from prior exam with tiny residual left apical pneumothorax. 2. Persistent bilateral pulmonary masses again noted. Small right pleural effusion. Electronically Signed   By: Marcello Moores  Register   On: 02/13/2020 07:00     Scheduled Meds: . benzonatate  200 mg Oral TID  . Chlorhexidine Gluconate Cloth  6 each Topical Daily  . chlorpheniramine-HYDROcodone  5 mL Oral Q12H  . escitalopram  10 mg Oral Daily  . gabapentin  300 mg Oral TID  . megestrol  400 mg Oral BID  . pantoprazole  40 mg Oral BID AC   Continuous Infusions:   LOS: 7 days   Time Spent in minutes   45 minutes  Zia Kanner D.O. on 02/14/2020 at 3:16 PM  Between 7am to 7pm - Please see pager noted on [email or web address]  After 7pm go to [email or web address]  And look for the night coverage person covering for me after hours  [facility] Office  [phone]

## 2020-02-14 NOTE — Plan of Care (Signed)

## 2020-02-14 NOTE — Progress Notes (Addendum)
      [address]       [place] [address]             [phone]           Subjective: Patient without breathing complaints this am. He did not have a bowel movement yesterday.  Objective: Vital signs in last 24 hours: Temp:  [97.6 F (36.4 C)-98.6 F (37 C)] 97.6 F (36.4 C) (02/02 0438) Pulse Rate:  [81-85] 81 (02/02 0438) Cardiac Rhythm: Normal sinus rhythm (02/01 1900) Resp:  [13-19] 19 (02/02 0438) BP: (110-125)/(70-81) 116/81 (02/02 0438) SpO2:  [90 %-96 %] 96 % (02/02 0438)     Intake/Output from previous day: 02/01 0701 - 02/02 0700 In: 480 [P.O.:480] Out: 1600 [Urine:1600]   Physical Exam:  Cardiovascular: RRR Pulmonary: Clear to auscultation on the right and slightly diminished left apex Abdomen: Soft, non tender, protuberant,bowel sounds present. Wounds: Dressing is clean and dry.   Chest Tube: to water seal, clamped. No air leak when unclamped  Lab Results: CBC:No results for input(s): WBC, HGB, HCT, PLT in the last 72 hours. BMET: No results for input(s): NA, K, CL, CO2, GLUCOSE, BUN, CREATININE, CALCIUM in the last 72 hours.  PT/INR: No results for input(s): LABPROT, INR in the last 72 hours. ABG:  INR: Will add last result for INR, ABG once components are confirmed Will add last 4 CBG results once components are confirmed  Assessment/Plan: Patient with metastatic sarcoma who presented with recurrent spontaneous  Chest tube is to waterseal,clamped.  No air leak noted after unclamped. CXR this am appears to show tiny, residual left apical pneumothorax, stable left lateral subcutaneous emphysema,  and persistent bilateral pulmonary masses. As discussed with Dr. Caffie Pinto, will remove chest tube. Check CXR in am. LOC constipation  Donielle M ZimmermanPA-C 02/14/2020,7:30 AM [phone]   Chart reviewed, patient examined, agree with above. CXR unchanged. No air leak. Agree with removing the chest tube.

## 2020-02-15 ENCOUNTER — Inpatient Hospital Stay (HOSPITAL_COMMUNITY): Payer: 59

## 2020-02-15 MED ORDER — OXYCODONE HCL 5 MG PO TABS
5.0000 mg | ORAL_TABLET | Freq: Three times a day (TID) | ORAL | Status: DC | PRN
  Administered 2020-02-15: 5 mg via ORAL
  Filled 2020-02-15: qty 1

## 2020-02-15 NOTE — Progress Notes (Signed)
      [address]       [place] [address]             [phone]           Subjective: Patient without breathing complaints this am.  Objective: Vital signs in last 24 hours: Temp:  [97.9 F (36.6 C)-98.3 F (36.8 C)] 97.9 F (36.6 C) (02/03 0422) Pulse Rate:  [80-83] 80 (02/03 0422) Cardiac Rhythm: Normal sinus rhythm (02/02 2000) Resp:  [15-21] 15 (02/03 0422) BP: (112-125)/(72-79) 112/72 (02/03 0422) SpO2:  [94 %-96 %] 95 % (02/03 0422) Weight:  [94.1 kg] 94.1 kg (02/03 0422)     Intake/Output from previous day: 02/02 0701 - 02/03 0700 In: 280 [P.O.:280] Out: 425 [Urine:425]   Physical Exam:  Cardiovascular: RRR Pulmonary: Clear to auscultation on the right and slightly diminished left apex Abdomen: Soft, non tender, protuberant,bowel sounds present. Wounds: Dressing is clean and dry.   Chest Tube: to water seal, clamped. No air leak when unclamped  Lab Results: CBC:No results for input(s): WBC, HGB, HCT, PLT in the last 72 hours. BMET: No results for input(s): NA, K, CL, CO2, GLUCOSE, BUN, CREATININE, CALCIUM in the last 72 hours.  PT/INR: No results for input(s): LABPROT, INR in the last 72 hours. 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 CXR this am appears to show tiny, residual (stable) left apical pneumothorax, stable left lateral subcutaneous emphysema,  and persistent bilateral pulmonary masses. Will see PRN    Sharalyn Ink ZimmermanPA-C 02/15/2020,7:22 AM [phone]

## 2020-02-15 NOTE — Care Management (Signed)
[id number] 02-15-20 Case Manager received an outpatient palliative referral for this patient. Case Manager spoke with patient and he was agreeable to having Case Manager call [facility] for Palliative Services. Case Manager spoke with Colletta [place] with Palliative and she will contact the patient post hospitalization to arrange a visit time. Transition of care team to continue to follow for additional needs. Bethena Roys, RN,BSN Case Manager

## 2020-02-15 NOTE — Progress Notes (Signed)
PROGRESS NOTE    Leonard [name]  LNL:[id number] DOB: [birth year] DOA: 02/07/2020 PCP: Leonard Squibb, MD   Brief Narrative:  HPI on 02/07/2020 by Dr. Burgess Amor [name] is a 60 y.o. male with medical history significant for lung cancer.  Patient was reffered to the ED after outpatient CT done yesterday showed a large pneumothorax. Worsening of his difficulty breathing and wheezing over the past few days.  He also reported some pain to the left flank.  He denies vomiting, but reports poor oral intake.  Reports intermittent diarrhea.  Patient had CT chest and abdomen with contrast yesterday that showed hydropneumothorax.  With mild spread metastatic disease to the lungs that appear to have progressed from prior study.  Also an enlarging probably enhancing lesion to the left upper pole of the left kidney concerning for neoplasm metastatic or primary. Patient subsequently followed up with his oncologist today and was referred to the ED.  Interim history Patient was admitted with a left-sided hydropneumothorax requiring a chest tube.  Pulmonology as well as cardiothoracic surgery consulted and appreciated. Assessment & Plan   Left-sided hydropneumothorax associated with necrotic malignant mass -Patient with chest tube in place.  Chest tube did get dislodged on 02/09/2020 and pneumothorax did worsen.  New chest tube was placed by Dr. Leonides [name]. -Pulmonology as well as cardiothoracic surgery consulted and appreciated -Chest tube has been set to waterseal and clamped.  No air leaks are noted. -Chest x-ray today shows tiny, residual left apical pneumothorax, stable left lateral subcutaneous emphysema, persistent bilateral pulmonary masses -Cardiothoracic surgery removed chest tube on 02/14/2020- pending further recommendations  Liposarcoma of the left lower extremity, metastatic -Oncologist recommended no further chemotherapy due to progression of disease  Bilateral rib pain due to lytic  lesions -Continue Dilaudid- will transition to oral medication given the use of IV (5 doses in the past 24 hours)  Depression and anxiety -Continue Lexapro and Xanax  Goals of care -Palliative care consulted and appreciated -Currently patient is full code   DVT Prophylaxis  SCDs  Code Status: Full  Family Communication: None at bedside  Disposition Plan:  Status is: Inpatient  Remains inpatient appropriate because: Pending cardiothoracic recommendations.    Dispo: The patient is from: Home              Anticipated d/c is to: Home              Anticipated d/c date is: 1 day              Patient currently is not medically stable to d/c.   Difficult to place patient No   Consultants Pulmonology Cardiothoracic surgery  Procedures  Chest tube placement  Antibiotics   Anti-infectives (From admission, onward)   None      Subjective:   Leonard [name] seen and examined today. Patient still complains of pain on the left side but states it is improved since having his chest tube removed.  Currently denies chest pain, abdominal pain, nausea or vomiting, diarrhea or constipation, dizziness or headache.  Does have some shortness of breath.   Objective:   Vitals:   02/14/20 2045 02/15/20 0422 02/15/20 0800 02/15/20 1200  BP: 125/76 112/72 125/76 114/77  Pulse: 82 80 70 78  Resp: 18 15 16 17   Temp: 98.3 F (36.8 C) 97.9 F (36.6 C) 98 F (36.7 C) 98.4 F (36.9 C)  TempSrc: Axillary Oral Oral Oral  SpO2: 96% 95% 90% 94%  Weight:  94.1 kg  Height:        Intake/Output Summary (Last 24 hours) at 02/15/2020 1521 Last data filed at 02/15/2020 1449 Gross per 24 hour  Intake 634 ml  Output 1225 ml  Net -591 ml   Filed Weights   02/08/20 0924 02/12/20 0345 02/15/20 0422  Weight: 91.9 kg 92 kg 94.1 kg   Exam  General: Well developed, chronically ill-appearing, NAD  HEENT: NCAT,  mucous membranes moist.   Cardiovascular: S1 S2 auscultated, no rubs, murmurs or  gallops. Regular rate and rhythm.  Dressing on left chest.  Respiratory: Diminished breath sounds otherwise clear  Abdomen: Soft, nontender, nondistended, + bowel sounds  Extremities: warm dry without cyanosis clubbing or edema  Neuro: AAOx3, nonfocal  Skin: Without rashes exudates or nodules, multiple tattoos  Psych: Normal affect and demeanor with intact judgement and insight   Data Reviewed: I have personally reviewed following labs and imaging studies  CBC: No results for input(s): WBC, NEUTROABS, HGB, HCT, MCV, PLT in the last 168 hours. Basic Metabolic Panel: No results for input(s): NA, K, CL, CO2, GLUCOSE, BUN, CREATININE, CALCIUM, MG, PHOS in the last 168 hours. GFR: Estimated Creatinine Clearance: 113.1 mL/min (by C-G formula based on SCr of 0.63 mg/dL). Liver Function Tests: No results for input(s): AST, ALT, ALKPHOS, BILITOT, PROT, ALBUMIN in the last 168 hours. No results for input(s): LIPASE, AMYLASE in the last 168 hours. No results for input(s): AMMONIA in the last 168 hours. Coagulation Profile: No results for input(s): INR, PROTIME in the last 168 hours. Cardiac Enzymes: No results for input(s): CKTOTAL, CKMB, CKMBINDEX, TROPONINI in the last 168 hours. BNP (last 3 results) No results for input(s): PROBNP in the last 8760 hours. HbA1C: No results for input(s): HGBA1C in the last 72 hours. CBG: No results for input(s): GLUCAP in the last 168 hours. Lipid Profile: No results for input(s): CHOL, HDL, LDLCALC, TRIG, CHOLHDL, LDLDIRECT in the last 72 hours. Thyroid Function Tests: No results for input(s): TSH, T4TOTAL, FREET4, T3FREE, THYROIDAB in the last 72 hours. Anemia Panel: No results for input(s): VITAMINB12, FOLATE, FERRITIN, TIBC, IRON, RETICCTPCT in the last 72 hours. Urine analysis: No results found for: COLORURINE, APPEARANCEUR, LABSPEC, PHURINE, GLUCOSEU, HGBUR, BILIRUBINUR, KETONESUR, PROTEINUR, UROBILINOGEN, NITRITE, LEUKOCYTESUR Sepsis  Labs: @LABRCNTIP (procalcitonin:4,lacticidven:4)  ) Recent Results (from the past 240 hour(s))  SARS Coronavirus 2 by RT PCR (hospital order, performed in [facility] hospital lab) Nasopharyngeal Nasopharyngeal Swab     Status: None   Collection Time: 02/07/20  2:31 PM   Specimen: Nasopharyngeal Swab  Result Value Ref Range Status   SARS Coronavirus 2 NEGATIVE NEGATIVE Final    Comment: (NOTE) SARS-CoV-2 target nucleic acids are NOT DETECTED.  The SARS-CoV-2 RNA is generally detectable in upper and lower respiratory specimens during the acute phase of infection. The lowest concentration of SARS-CoV-2 viral copies this assay can detect is 250 copies / mL. A negative result does not preclude SARS-CoV-2 infection and should not be used as the sole basis for treatment or other patient management decisions.  A negative result may occur with improper specimen collection / handling, submission of specimen other than nasopharyngeal swab, presence of viral mutation(s) within the areas targeted by this assay, and inadequate number of viral copies (<250 copies / mL). A negative result must be combined with clinical observations, patient history, and epidemiological information.  Fact Sheet for Patients:   StrictlyIdeas.no  Fact Sheet for Healthcare Providers: BankingDealers.co.za  This test is not yet approved or  cleared by the [place]  States FDA and has been authorized for detection and/or diagnosis of SARS-CoV-2 by FDA under an Emergency Use Authorization (EUA).  This EUA will remain in effect (meaning this test can be used) for the duration of the COVID-19 declaration under Section 564(b)(1) of the Act, 21 U.S.C. section 360bbb-3(b)(1), unless the authorization is terminated or revoked sooner.  Performed at [facility], [address] [place], [place] [address]       Radiology Studies: DG Chest 2 View  Result Date:  02/15/2020 CLINICAL DATA:  Pneumothorax, recent chest tube removal, history of lung cancer EXAM: CHEST - 2 VIEW COMPARISON:  Radiograph 02/14/2020, CT 02/06/2020 FINDINGS: Accessed right IJ approach Port-A-Cath tip terminates in the lower SVC. Telemetry leads and support devices overlie the chest. Cholecystectomy clips in the right upper quadrant. Persistent subcutaneous emphysema extending across the left chest wall. Suspect small residual left apical visceral pleural line with additional increasing lucency along the left heart and mediastinal borders which could reflect a pneumomediastinum or less likely medial pneumothorax. Small bilateral effusions. Persistent heterogeneous opacities are again seen in the mid to lower lungs with additional bilateral pulmonary masses, largest in the left lung apex. No acute or worrisome osseous abnormality. IMPRESSION: 1. Suspect small residual left apical visceral pleural line/apical pneumothorax. Additional increasing lucency along the left heart and mediastinal borders which could reflect a pneumomediastinum or less likely medial pneumothorax component. Small bilateral effusions. 2. Extensive subcutaneous emphysema remains of the left chest wall. 3. Persistent heterogeneous opacities in the mid to lower lungs, favor edema and atelectasis. 4. Redemonstrated pulmonary masses, largest in the left apex. Electronically Signed   By: Lovena Le M.D.   On: 02/15/2020 06:46   DG Chest 2 View  Result Date: 02/14/2020 CLINICAL DATA:  Pneumothorax EXAM: CHEST - 2 VIEW COMPARISON:  Chest x-ray 02/13/2020 FINDINGS: Right chest wall Port-A-Cath and left chest tube in stable appropriate position. The heart size and mediastinal contours are within normal limits. Persistent small apical left pneumothorax. Persistent bilateral pulmonary masses again noted. Possible trace right pleural effusion. No definite left pleural effusion. No acute osseous abnormality. IMPRESSION: 1. Grossly stable  small left apical pneumothorax. 2. Trace right pleural effusion. 3. Persistent bilateral pulmonary masses. 4. Lines and tubes in stable position. Electronically Signed   By: Iven Finn M.D.   On: 02/14/2020 06:54     Scheduled Meds: . benzonatate  200 mg Oral TID  . Chlorhexidine Gluconate Cloth  6 each Topical Daily  . chlorpheniramine-HYDROcodone  5 mL Oral Q12H  . escitalopram  10 mg Oral Daily  . gabapentin  300 mg Oral TID  . megestrol  400 mg Oral BID  . pantoprazole  40 mg Oral BID AC   Continuous Infusions:   LOS: 8 days   Time Spent in minutes   30 minutes  Kameelah Minish D.O. on 02/15/2020 at 3:21 PM  Between 7am to 7pm - Please see pager noted on [email or web address]  After 7pm go to [email or web address]  And look for the night coverage person covering for me after hours  [facility] Office  [phone]

## 2020-02-16 MED ORDER — HEPARIN SOD (PORK) LOCK FLUSH 100 UNIT/ML IV SOLN
500.0000 [IU] | INTRAVENOUS | Status: AC | PRN
  Administered 2020-02-16: 500 [IU]
  Filled 2020-02-16: qty 5

## 2020-02-16 MED ORDER — PANTOPRAZOLE SODIUM 40 MG PO TBEC: 40.0000 mg | DELAYED_RELEASE_TABLET | Freq: Two times a day (BID) | ORAL | 0 refills | Status: AC

## 2020-02-16 MED ORDER — GABAPENTIN 300 MG PO CAPS: 300.0000 mg | ORAL_CAPSULE | Freq: Three times a day (TID) | ORAL | 1 refills | Status: DC

## 2020-02-16 MED ORDER — BENZONATATE 200 MG PO CAPS: 200.0000 mg | ORAL_CAPSULE | Freq: Three times a day (TID) | ORAL | 0 refills | Status: DC

## 2020-02-16 NOTE — TOC Transition Note (Signed)
Transition of Care [facility]) - CM/SW Discharge Note   Patient Details  Name: Leonard [name] MRN: [id number] Date of Birth: [birth year]  [facility]) CM/SW Contact:  Pollie Friar, RN Phone Number: 02/16/2020, 10:28 AM   Clinical Narrative:    Pt is discharging home with outpatient palliative care through [facility] has updated [place] with [facility] of his d/c home. Pt has transport home.   Final next level of care: Home/Self Care Barriers to Discharge: No Barriers Identified   Patient Goals and CMS Choice     Choice offered to / list presented to : Patient  Discharge Placement                       Discharge Plan and Services                            [place] Date [place]: 02/16/20   Representative spoke with at [place]: [place]) Interventions     Readmission Risk Interventions No flowsheet data found.

## 2020-02-16 NOTE — Discharge Summary (Signed)
Physician Discharge Summary  Leonard [name] EXN:[id number] DOB: 14-Aug-1960 DOA: 02/07/2020  PCP: Celene Squibb, MD  Admit date: 02/07/2020 Discharge date: 02/16/2020  Time spent: 45 minutes  Recommendations for Outpatient Follow-up:  Patient will be discharged to home with palliative care to follow.  Patient will need to follow up with primary care provider within one week of discharge.  Follow up with cardiothoracic surgery.  Patient should continue medications as prescribed.  Patient should follow a regular diet.   Discharge Diagnoses:  Left-sided hydropneumothorax associated with necrotic malignant mass Liposarcoma of the left lower extremity, metastatic Bilateral rib pain due to lytic lesions Depression and anxiety Goals of care  Discharge Condition: Stable  Diet recommendation: regular  Filed Weights   02/12/20 0345 02/15/20 0422 02/16/20 0517  Weight: 92 kg 94.1 kg 94.4 kg    History of present illness:  on 02/07/2020 by Dr. Aldean Ast Leonard [name] a 60 y.o.malewith medical history significant forlung cancer. Patient was refferedto the ED after outpatient CT done yesterday showed a large pneumothorax. Worsening of his difficulty breathing and wheezing over the past few days. He also reported some pain to the left flank.He denies vomiting, but reports poor oral intake. Reports intermittent diarrhea.  Patient had CT chest and abdomen with contrast yesterday that showed hydropneumothorax. With mild spread metastatic disease to the lungs that appear to have progressed from prior study. Also an enlarging probably enhancing lesion to the left upper pole of the left kidney concerning for neoplasm metastatic or primary. Patient subsequently followed up with his oncologist today and was referred to the ED.  Hospital Course:  Left-sided hydropneumothorax associated with necrotic malignant mass -Patient with chest tube in place.  Chest tube did get dislodged on  02/09/2020 and pneumothorax did worsen.  New chest tube was placed by Dr. Leonides Schanz. -Pulmonology as well as cardiothoracic surgery consulted and appreciated -Chest tube has been set to waterseal and clamped.  No air leaks are noted. -Chest x-ray today shows tiny, residual left apical pneumothorax, stable left lateral subcutaneous emphysema, persistent bilateral pulmonary masses -Cardiothoracic surgery removed chest tube on 02/14/2020- will follow up with cardiothoracic surgery in the office  Liposarcoma of the left lower extremity, metastatic -Oncologist recommended no further chemotherapy due to progression of disease  Bilateral rib pain due to lytic lesions -Transitioned back to oxycodone  Depression and anxiety -Continue Lexapro and Xanax  Goals of care -Palliative care consulted and appreciated -Currently patient is full code -Will discharge with palliative care to follow  Consultants Pulmonology Cardiothoracic surgery  Procedures  Chest tube placement  Discharge Exam: Vitals:   02/16/20 0517 02/16/20 0918  BP: 114/67 121/75  Pulse: 82 95  Resp: 19   Temp: 98.2 F (36.8 C)   SpO2: 94% 96%     General: Well developed, chronically ill-appearing, NAD  HEENT: NCAT, mucous membranes moist.  Cardiovascular: S1 S2 auscultated, RRR.  Respiratory: Diminished breath sounds however clear  Abdomen: Soft, nontender, nondistended, + bowel sounds  Extremities: warm dry without cyanosis clubbing or edema  Neuro: AAOx3, nonfocal  Psych: pleasant, appropriate mood and affect  Discharge Instructions Discharge Instructions    Discharge instructions   Complete by: As directed    Patient will be discharged to home with palliative care to follow.  Patient will need to follow up with primary care provider within one week of discharge.  Follow up with cardiothoracic surgery.  Patient should continue medications as prescribed.  Patient should follow a regular diet.  Discharge  wound care:   Complete by: As directed    Keep clean and dry   Increase activity slowly   Complete by: As directed      Allergies as of 02/16/2020   No Known Allergies     Medication List    TAKE these medications   ALPRAZolam 0.25 MG tablet Commonly known as: XANAX Take 1 tablet (0.25 mg total) by mouth 2 (two) times daily as needed for anxiety.   aluminum-magnesium hydroxide-simethicone 194-174-08 MG/5ML Susp Commonly known as: MAALOX Swish and swallow 5 ml four times daily as needed for mouth sores.   benzonatate 200 MG capsule Commonly known as: TESSALON Take 1 capsule (200 mg total) by mouth 3 (three) times daily.   escitalopram 10 MG tablet Commonly known as: LEXAPRO Take 1 tablet (10 mg total) by mouth daily.   gabapentin 300 MG capsule Commonly known as: NEURONTIN Take 1 capsule (300 mg total) by mouth 3 (three) times daily.   GEMZAR IV Inject into the vein.   HYDROcodone-homatropine 5-1.5 MG/5ML syrup Commonly known as: HYCODAN Take 10 mLs by mouth every 6 (six) hours as needed for cough. SMARTSIG:1 Teaspoon By Mouth Every 6 Hours PRN What changed: additional instructions   megestrol 400 MG/10ML suspension Commonly known as: MEGACE Take 10 mLs (400 mg total) by mouth 2 (two) times daily.   Oxycodone HCl 10 MG Tabs Take 0.5 tablets (5 mg total) by mouth every 8 (eight) hours as needed. What changed: reasons to take this   pantoprazole 40 MG tablet Commonly known as: PROTONIX Take 1 tablet (40 mg total) by mouth 2 (two) times daily before a meal.   prochlorperazine 10 MG tablet Commonly known as: COMPAZINE TAKE (1) TABLET BY MOUTH EVERY (6) HOURS AS NEEDED. What changed:   how much to take  how to take this  when to take this  reasons to take this  additional instructions   TAXOTERE IV Inject into the vein.   zolpidem 10 MG tablet Commonly known as: AMBIEN Take 1 tablet (10 mg total) by mouth at bedtime as needed for sleep.             Discharge Care Instructions  (From admission, onward)         Start     Ordered   02/16/20 0000  Discharge wound care:       Comments: Keep clean and dry   02/16/20 0951         No Known Allergies  Follow-up Information    Gaye Pollack, MD. Go on 02/21/2020.   Specialty: Cardiothoracic Surgery Why: PA/LAT CXR (to be taken at [facility] which is in the same building as Dr. Vivi Martens office) on 02/09 at 3:00;Appointment time is at 3:30 pm  Contact information: [place] [address] [phone]        Celene Squibb, MD. Schedule an appointment as soon as possible for a visit in 1 week(s).   Specialty: Internal Medicine Why: Hospital follow up Contact information: [facility] [address] [phone]                The results of significant diagnostics from this hospitalization (including imaging, microbiology, ancillary and laboratory) are listed below for reference.    Significant Diagnostic Studies: DG Chest 2 View  Result Date: 02/15/2020 CLINICAL DATA:  Pneumothorax, recent chest tube removal, history of lung cancer EXAM: CHEST - 2 VIEW COMPARISON:  Radiograph  02/14/2020, CT 02/06/2020 FINDINGS: Accessed right IJ approach Port-A-Cath tip terminates in the lower SVC. Telemetry leads and support devices overlie the chest. Cholecystectomy clips in the right upper quadrant. Persistent subcutaneous emphysema extending across the left chest wall. Suspect small residual left apical visceral pleural line with additional increasing lucency along the left heart and mediastinal borders which could reflect a pneumomediastinum or less likely medial pneumothorax. Small bilateral effusions. Persistent heterogeneous opacities are again seen in the mid to lower lungs with additional bilateral pulmonary masses, largest in the left lung apex. No acute or worrisome osseous abnormality. IMPRESSION: 1. Suspect small residual left  apical visceral pleural line/apical pneumothorax. Additional increasing lucency along the left heart and mediastinal borders which could reflect a pneumomediastinum or less likely medial pneumothorax component. Small bilateral effusions. 2. Extensive subcutaneous emphysema remains of the left chest wall. 3. Persistent heterogeneous opacities in the mid to lower lungs, favor edema and atelectasis. 4. Redemonstrated pulmonary masses, largest in the left apex. Electronically Signed   By: Lovena Le M.D.   On: 02/15/2020 06:46   DG Chest 2 View  Result Date: 02/14/2020 CLINICAL DATA:  Pneumothorax EXAM: CHEST - 2 VIEW COMPARISON:  Chest x-ray 02/13/2020 FINDINGS: Right chest wall Port-A-Cath and left chest tube in stable appropriate position. The heart size and mediastinal contours are within normal limits. Persistent small apical left pneumothorax. Persistent bilateral pulmonary masses again noted. Possible trace right pleural effusion. No definite left pleural effusion. No acute osseous abnormality. IMPRESSION: 1. Grossly stable small left apical pneumothorax. 2. Trace right pleural effusion. 3. Persistent bilateral pulmonary masses. 4. Lines and tubes in stable position. Electronically Signed   By: Iven Finn M.D.   On: 02/14/2020 06:54   DG Chest 2 View  Result Date: 02/13/2020 CLINICAL DATA:  Chest tube. EXAM: CHEST - 2 VIEW COMPARISON:  02/12/2020. FINDINGS: Left chest tube in stable position. Left pneumothorax improved from prior exam with tiny residual left apical pneumothorax. Persistent bilateral pulmonary masses again noted. Small right pleural effusion. Heart size stable. Degenerative change thoracic spine. IMPRESSION: 1. Left chest tube in stable position. Left pneumothorax improved from prior exam with tiny residual left apical pneumothorax. 2. Persistent bilateral pulmonary masses again noted. Small right pleural effusion. Electronically Signed   By: Marcello Moores  Register   On: 02/13/2020 07:00    CT CHEST ABDOMEN PELVIS W CONTRAST  Result Date: 02/06/2020 CLINICAL DATA:  60 year old male with history of liposarcoma in the left lower extremity. EXAM: CT CHEST, ABDOMEN, AND PELVIS WITH CONTRAST TECHNIQUE: Multidetector CT imaging of the chest, abdomen and pelvis was performed following the standard protocol during bolus administration of intravenous contrast. CONTRAST:  172mL OMNIPAQUE IOHEXOL 300 MG/ML  SOLN COMPARISON:  CT the chest, abdomen and pelvis 11/01/2019. FINDINGS: CT CHEST FINDINGS Cardiovascular: Heart size is normal. Trace amount of pericardial fluid and/or thickening, unlikely to be of hemodynamic significance at this time. No associated pericardial calcification. There is aortic atherosclerosis, as well as atherosclerosis of the great vessels of the mediastinum and the coronary arteries, including calcified atherosclerotic plaque in the left main, left anterior descending and right coronary arteries.Right internal jugular single-lumen porta cath with tip terminating at the superior cavoatrial junction. Mediastinum/Nodes: No pathologically enlarged mediastinal or hilar lymph nodes. Esophagus is unremarkable in appearance. No axillary lymphadenopathy. Lungs/Pleura: Multiple large pulmonary nodules and masses are again noted in the lungs bilaterally, generally increased in size compared to the prior examination. The largest of these is in the left upper lobe near the  apex (axial image 17 of series 2) measuring 5.5 x 5.6 cm, significantly increased from 2.1 x 1.8 cm on the prior examination. This lesion is centrally low-attenuation, suggesting internal areas of necrosis. Small bilateral pleural effusions. New large left pneumothorax with considerable atelectasis in the left lung. Musculoskeletal: There are no aggressive appearing lytic or blastic lesions noted in the visualized portions of the skeleton. CT ABDOMEN PELVIS FINDINGS Hepatobiliary: No suspicious cystic or solid hepatic lesions.  No intra or extrahepatic biliary ductal dilatation. Status post cholecystectomy. Pancreas: No pancreatic mass. No pancreatic ductal dilatation. No pancreatic or peripancreatic fluid collections or inflammatory changes. Spleen: Unremarkable. Adrenals/Urinary Tract: In the upper pole the left kidney there is enhancing lesion measuring 1.7 cm in diameter, increased in size compared to the prior study (previously 1.2 cm), concerning for neoplasm. Exophytic 1.2 cm low-attenuation lesion in the lower pole the left kidney, compatible with a small simple cyst. Right kidney and bilateral adrenal glands are normal in appearance. No hydroureteronephrosis. Urinary bladder is normal in appearance. Stomach/Bowel: The appearance of the stomach is normal. There is no pathologic dilatation of small bowel or colon. Normal appendix. Vascular/Lymphatic: Aortic atherosclerosis, without evidence of aneurysm or dissection in the abdominal or pelvic vasculature. No lymphadenopathy noted in the abdomen or pelvis. Reproductive: Prostate gland and seminal vesicles are unremarkable in appearance. Other: No significant volume of ascites.  No pneumoperitoneum. Musculoskeletal: There are no aggressive appearing lytic or blastic lesions noted in the visualized portions of the skeleton. IMPRESSION: 1. New large left hydropneumothorax. 2. Widespread metastatic disease to the lungs appears progressive compared to the prior study, as above. 3. Enlarging probably enhancing lesion in the upper pole the left kidney concerning for neoplasm, either metastatic or primary. 4. Small right pleural effusion. 5. Aortic atherosclerosis, in addition to left main and 2 vessel coronary artery disease. Please note that although the presence of coronary artery calcium documents the presence of coronary artery disease, the severity of this disease and any potential stenosis cannot be assessed on this non-gated CT examination. Assessment for potential risk factor  modification, dietary therapy or pharmacologic therapy may be warranted, if clinically indicated. 6. Additional incidental findings, as above. Critical Value/emergent results were called by telephone at the time of interpretation on 02/06/2020 at 11:35 am to provider [facility] , who verbally acknowledged these results. Electronically Signed   By: Vinnie Langton M.D.   On: 02/06/2020 11:36   DG Chest Port 1 View  Result Date: 02/12/2020 CLINICAL DATA:  60 year old male with history of pneumothorax status post chest tube placement. EXAM: PORTABLE CHEST 1 VIEW COMPARISON:  Chest x-ray 02/09/2020. FINDINGS: Right internal jugular single-lumen power porta cath with tip terminating at the superior cavoatrial junction. Left-sided chest tube in position with tip and side port projecting over the lower left hemithorax. Small persistent left pneumothorax, increased slightly in size compared to the prior study. No right-sided pneumothorax. Multiple pulmonary nodules and masses again noted, better demonstrated on prior chest CT 02/06/2020. No definite acute consolidative airspace disease. No pleural effusions. No evidence of pulmonary edema. Heart size is normal. Upper mediastinal contours are within normal limits. Extensive subcutaneous emphysema in the left chest wall again noted. IMPRESSION: 1. Support apparatus, as above. 2. Increased size of small left-sided pneumothorax. 3. Multiple pulmonary nodules and masses, similar to prior examinations, presumably reflective of metastatic disease. Electronically Signed   By: Vinnie Langton M.D.   On: 02/12/2020 08:16   DG CHEST PORT 1 VIEW  Result Date: 02/09/2020  CLINICAL DATA:  Follow-up hydropneumothorax. Status post chest tube removal. EXAM: PORTABLE CHEST 1 VIEW COMPARISON:  02/09/2020 FINDINGS: Right chest wall port a catheter is noted with tip at the cavoatrial junction. The left-sided chest tube has been removed. Small left apical pneumothorax persists.  Unchanged appearance of gas within the soft tissues of the left chest wall. Masslike opacities within the left upper lobe and right base are unchanged. IMPRESSION: Persistent small left apical pneumothorax status post left-sided chest tube removal. Electronically Signed   By: Kerby Moors M.D.   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 Recent left pneumothorax. EXAM: PORTABLE CHEST 1 VIEW COMPARISON:  Portable chest 02/08/2020 and earlier. FINDINGS: Portable AP upright view at 0816 hours. Left chest tube is in place although there may be side holes outside of the left pleural space (arrow). A small volume of left chest wall gas has increased. Small residual left pneumothorax. No mediastinal shift. Masslike bilateral pulmonary opacities compatible with known metastases. Stable cardiac size and mediastinal contours. Visualized tracheal air column is within normal limits. Stable right chest porta cath. Negative visible bowel gas pattern. Stable visualized osseous structures. IMPRESSION: 1. Left chest tube in place although there may be tube side holes outside of the pleural space. A small volume of chest wall gas has increased. 2. Stable small residual pneumothorax. 3. Stable pulmonary metastases. Electronically Signed   By: Genevie Ann M.D.   On: 02/09/2020 08:25   DG CHEST PORT 1 VIEW  Result Date: 02/08/2020 CLINICAL DATA:  Hydropneumothorax, chest tube on LEFT EXAM: PORTABLE CHEST 1 VIEW COMPARISON:  Portable exam 7846 hours compared to 02/07/2020 FINDINGS: Pigtail RIGHT thoracostomy tube, partially withdrawn since previous exam with tip projecting at LEFT costal margin. Persistent small LEFT apex pneumothorax. Basilar LEFT pleural effusion. Normal heart size mediastinal contours. RIGHT jugular Port-A-Cath stable tip projecting over SVC. Atelectasis versus infiltrate at RIGHT base. BILATERAL pulmonary nodules, largest a mass  at LEFT apex. Bones demineralized. IMPRESSION: Persistent small LEFT hydropneumothorax. LEFT thoracostomy tube has been partially withdrawn with the pigtail now at LEFT costal margin. Electronically Signed   By: Lavonia Dana M.D.   On: 02/08/2020 15:11   DG Chest Portable 1 View  Result Date: 02/07/2020 CLINICAL DATA:  Follow-up left pneumothorax.  Lung carcinoma. EXAM: PORTABLE CHEST 1 VIEW COMPARISON:  Prior today FINDINGS: There has been placement of a left pleural pigtail catheter since previous study, with near complete resolution of left pneumothorax since prior exam. Multiple pulmonary nodules and masses are again seen in both lungs, largest in the left upper lobe and right lower lobe. Small right pleural effusion remains stable. Right-sided Port-A-Cath remains in appropriate position. IMPRESSION: Near complete resolution of left pneumothorax following left pleural catheter placement. Electronically Signed   By: Marlaine Hind M.D.   On: 02/07/2020 14:55   DG Chest Portable 1 View  Result Date: 02/07/2020 CLINICAL DATA:  Left pneumothorax. EXAM: PORTABLE CHEST 1 VIEW COMPARISON:  CT 02/06/2020. FINDINGS: Mediastinum hilar structures normal. Borderline cardiomegaly. Large left pneumothorax noted. Similar finding noted on prior CT. Prominent atelectasis left lung. Multiple prominent bilateral pulmonary masses again noted as noted on prior CT. No acute bony abnormality identified. Contrast in the colon. IMPRESSION: 1. Large left pneumothorax. Similar finding noted on prior CT. Prominent atelectasis left lung. 2. Multiple prominent bilateral pulmonary masses again noted as noted on prior CT. Electronically Signed   By: Marcello Moores  Register   On: 02/07/2020 13:50   DG Chest Port 1V same Day  Result Date: 02/09/2020 CLINICAL DATA:  Shortness of breath, history of metastatic lung carcinoma EXAM: PORTABLE CHEST 1 VIEW COMPARISON:  Film from earlier in the same day. FINDINGS: Cardiac shadow is stable. Left-sided  chest tube is again identified. Small left apical pneumothorax is again identified stable in appearance. Stable left upper lobe mass lesion is noted. Nodules are noted in the right lung as well as patchy atelectatic changes in the bases bilaterally. Considerable subcutaneous emphysema is noted. No bony abnormality is seen. Right chest wall port is noted. IMPRESSION: Stable left apical pneumothorax with chest tube in place. Changes of metastatic disease within the lungs. Electronically Signed   By: Inez Catalina M.D.   On: 02/09/2020 23:23   DG Chest Port 1V same Day  Result Date: 02/09/2020 CLINICAL DATA:  Pneumothorax.  Chest tube placement. EXAM: PORTABLE CHEST 1 VIEW COMPARISON:  February 09, 2020 FINDINGS: There has been interval placement of a left-sided chest tube. There is a small residual left-sided pneumothorax, improved from prior study. The right-sided Port-A-Cath is well position. Bilateral airspace opacities are again noted, not substantially changed from prior study. There is subcutaneous emphysema along the patient's left flank, stable to improved from prior study. The heart size is unchanged. IMPRESSION: 1. Interval placement of a left-sided chest tube. 2. Small residual left-sided pneumothorax, improved from prior study. 3. Otherwise, no significant short interval change. Electronically Signed   By: Constance Holster M.D.   On: 02/09/2020 16:25   DG Chest Port 1V same Day  Result Date: 02/09/2020 CLINICAL DATA:  Chest tube removal EXAM: PORTABLE CHEST 1 VIEW COMPARISON:  02/09/2020 FINDINGS: Further increased size of the left pneumothorax, now noted laterally and at the left apex, approximately 15%. Right Port-A-Cath remains in place, unchanged. Airspace opacity at the right lung base is unchanged. Heart is borderline in size. Subcutaneous emphysema throughout the left chest wall. IMPRESSION: Enlarging left pneumothorax, now approximately 15%. Otherwise no change. These results will be called  to the ordering clinician or representative by the Radiologist Assistant, and communication documented in the PACS or [employer]. Electronically Signed   By: Rolm Baptise M.D.   On: 02/09/2020 15:33    Microbiology: Recent Results (from the past 240 hour(s))  SARS Coronavirus 2 by RT PCR (hospital order, performed in [facility] hospital lab) Nasopharyngeal Nasopharyngeal Swab     Status: None   Collection Time: 02/07/20  2:31 PM   Specimen: Nasopharyngeal Swab  Result Value Ref Range Status   SARS Coronavirus 2 NEGATIVE NEGATIVE Final    Comment: (NOTE) SARS-CoV-2 target nucleic acids are NOT DETECTED.  The SARS-CoV-2 RNA is generally detectable in upper and lower respiratory specimens during the acute phase of infection. The lowest concentration of SARS-CoV-2 viral copies this assay can detect is 250 copies / mL. A negative result does not preclude SARS-CoV-2 infection and should not be used as the sole basis for treatment or other patient management decisions.  A negative result may occur with improper specimen collection / handling, submission of specimen other than nasopharyngeal swab, presence of viral mutation(s) within the areas targeted by this assay, and inadequate number of viral copies (<250 copies / mL). A negative result must be combined with clinical observations, patient history, and epidemiological information.  Fact Sheet for Patients:   StrictlyIdeas.no  Fact Sheet for Healthcare Providers: BankingDealers.co.za  This test is not yet approved or  cleared by the  [place] Architectural technologist and has been authorized for detection and/or diagnosis of SARS-CoV-2 by FDA under an Print production planner (EUA).  This EUA will remain in effect (meaning this test can be used) for the duration of the COVID-19 declaration under Section 564(b)(1) of the Act, 21 U.S.C. section 360bbb-3(b)(1), unless the authorization is terminated  or revoked sooner.  Performed at [facility], [address] [place], [place] [address]      Labs: Basic Metabolic Panel: No results for input(s): NA, K, CL, CO2, GLUCOSE, BUN, CREATININE, CALCIUM, MG, PHOS in the last 168 hours. Liver Function Tests: No results for input(s): AST, ALT, ALKPHOS, BILITOT, PROT, ALBUMIN in the last 168 hours. No results for input(s): LIPASE, AMYLASE in the last 168 hours. No results for input(s): AMMONIA in the last 168 hours. CBC: No results for input(s): WBC, NEUTROABS, HGB, HCT, MCV, PLT in the last 168 hours. Cardiac Enzymes: No results for input(s): CKTOTAL, CKMB, CKMBINDEX, TROPONINI in the last 168 hours. BNP: BNP (last 3 results) No results for input(s): BNP in the last 8760 hours.  ProBNP (last 3 results) No results for input(s): PROBNP in the last 8760 hours.  CBG: No results for input(s): GLUCAP in the last 168 hours.     Signed:  Cristal Ford  [facility] 02/16/2020, 9:52 AM

## 2020-02-20 ENCOUNTER — Other Ambulatory Visit: Payer: Self-pay | Admitting: Surgery

## 2020-02-20 ENCOUNTER — Inpatient Hospital Stay (HOSPITAL_COMMUNITY): Payer: 59 | Attending: Oncology | Admitting: Hematology

## 2020-02-20 ENCOUNTER — Other Ambulatory Visit: Payer: Self-pay

## 2020-02-20 ENCOUNTER — Other Ambulatory Visit (HOSPITAL_COMMUNITY): Payer: Self-pay

## 2020-02-20 ENCOUNTER — Encounter (HOSPITAL_COMMUNITY): Payer: Self-pay

## 2020-02-20 ENCOUNTER — Inpatient Hospital Stay (HOSPITAL_COMMUNITY)
Admission: EM | Admit: 2020-02-20 | Discharge: 2020-02-29 | DRG: 181 | Disposition: A | Discharge status: Home Health | Payer: 59 | Attending: Internal Medicine | Admitting: Internal Medicine

## 2020-02-20 ENCOUNTER — Emergency Department (HOSPITAL_COMMUNITY): Payer: 59

## 2020-02-20 ENCOUNTER — Ambulatory Visit (HOSPITAL_COMMUNITY)
Admission: RE | Admit: 2020-02-20 | Discharge: 2020-02-20 | Disposition: A | Discharge status: Home / Self Care | Payer: 59 | Source: Ambulatory Visit | Attending: Hematology | Admitting: Hematology

## 2020-02-20 VITALS — BP 114/90 | HR 101 | Temp 98.1°F | Resp 20 | Wt 205.0 lb

## 2020-02-20 DIAGNOSIS — J9383 Other pneumothorax: Secondary | ICD-10-CM | POA: Diagnosis present

## 2020-02-20 DIAGNOSIS — R042 Hemoptysis: Secondary | ICD-10-CM | POA: Diagnosis not present

## 2020-02-20 DIAGNOSIS — R918 Other nonspecific abnormal finding of lung field: Secondary | ICD-10-CM

## 2020-02-20 DIAGNOSIS — F32A Depression, unspecified: Secondary | ICD-10-CM | POA: Diagnosis present

## 2020-02-20 DIAGNOSIS — Z6829 Body mass index (BMI) 29.0-29.9, adult: Secondary | ICD-10-CM | POA: Diagnosis not present

## 2020-02-20 DIAGNOSIS — Z9689 Presence of other specified functional implants: Secondary | ICD-10-CM

## 2020-02-20 DIAGNOSIS — Z79818 Long term (current) use of other agents affecting estrogen receptors and estrogen levels: Secondary | ICD-10-CM

## 2020-02-20 DIAGNOSIS — N289 Disorder of kidney and ureter, unspecified: Secondary | ICD-10-CM | POA: Diagnosis present

## 2020-02-20 DIAGNOSIS — C7800 Secondary malignant neoplasm of unspecified lung: Secondary | ICD-10-CM | POA: Insufficient documentation

## 2020-02-20 DIAGNOSIS — J939 Pneumothorax, unspecified: Secondary | ICD-10-CM | POA: Diagnosis not present

## 2020-02-20 DIAGNOSIS — C7951 Secondary malignant neoplasm of bone: Secondary | ICD-10-CM | POA: Insufficient documentation

## 2020-02-20 DIAGNOSIS — Z79899 Other long term (current) drug therapy: Secondary | ICD-10-CM | POA: Diagnosis not present

## 2020-02-20 DIAGNOSIS — Z87891 Personal history of nicotine dependence: Secondary | ICD-10-CM

## 2020-02-20 DIAGNOSIS — F419 Anxiety disorder, unspecified: Secondary | ICD-10-CM | POA: Diagnosis present

## 2020-02-20 DIAGNOSIS — E669 Obesity, unspecified: Secondary | ICD-10-CM | POA: Diagnosis present

## 2020-02-20 DIAGNOSIS — R0600 Dyspnea, unspecified: Secondary | ICD-10-CM | POA: Diagnosis not present

## 2020-02-20 DIAGNOSIS — C499 Malignant neoplasm of connective and soft tissue, unspecified: Secondary | ICD-10-CM

## 2020-02-20 DIAGNOSIS — Z9049 Acquired absence of other specified parts of digestive tract: Secondary | ICD-10-CM

## 2020-02-20 DIAGNOSIS — C7802 Secondary malignant neoplasm of left lung: Secondary | ICD-10-CM | POA: Diagnosis present

## 2020-02-20 DIAGNOSIS — I2510 Atherosclerotic heart disease of native coronary artery without angina pectoris: Secondary | ICD-10-CM | POA: Insufficient documentation

## 2020-02-20 DIAGNOSIS — Z9221 Personal history of antineoplastic chemotherapy: Secondary | ICD-10-CM | POA: Diagnosis not present

## 2020-02-20 DIAGNOSIS — R5383 Other fatigue: Secondary | ICD-10-CM | POA: Insufficient documentation

## 2020-02-20 DIAGNOSIS — Z20822 Contact with and (suspected) exposure to covid-19: Secondary | ICD-10-CM | POA: Diagnosis present

## 2020-02-20 DIAGNOSIS — J948 Other specified pleural conditions: Secondary | ICD-10-CM | POA: Diagnosis present

## 2020-02-20 DIAGNOSIS — E860 Dehydration: Secondary | ICD-10-CM | POA: Diagnosis present

## 2020-02-20 DIAGNOSIS — R1314 Dysphagia, pharyngoesophageal phase: Secondary | ICD-10-CM | POA: Diagnosis present

## 2020-02-20 DIAGNOSIS — E876 Hypokalemia: Secondary | ICD-10-CM | POA: Diagnosis present

## 2020-02-20 DIAGNOSIS — R059 Cough, unspecified: Secondary | ICD-10-CM | POA: Insufficient documentation

## 2020-02-20 DIAGNOSIS — C4922 Malignant neoplasm of connective and soft tissue of left lower limb, including hip: Secondary | ICD-10-CM | POA: Diagnosis present

## 2020-02-20 DIAGNOSIS — J9312 Secondary spontaneous pneumothorax: Secondary | ICD-10-CM | POA: Diagnosis not present

## 2020-02-20 DIAGNOSIS — I700 Atherosclerosis of aorta: Secondary | ICD-10-CM | POA: Insufficient documentation

## 2020-02-20 DIAGNOSIS — R1319 Other dysphagia: Secondary | ICD-10-CM | POA: Diagnosis not present

## 2020-02-20 DIAGNOSIS — F418 Other specified anxiety disorders: Secondary | ICD-10-CM | POA: Insufficient documentation

## 2020-02-20 DIAGNOSIS — R069 Unspecified abnormalities of breathing: Secondary | ICD-10-CM

## 2020-02-20 LAB — CBC WITH DIFFERENTIAL/PLATELET
Abs Immature Granulocytes: 0.04 10*3/uL (ref 0.00–0.07)
Basophils Absolute: 0.1 10*3/uL (ref 0.0–0.1)
Basophils Relative: 1 %
Eosinophils Absolute: 0.5 10*3/uL (ref 0.0–0.5)
Eosinophils Relative: 5 %
HCT: 43.3 % (ref 39.0–52.0)
Hemoglobin: 14.1 g/dL (ref 13.0–17.0)
Immature Granulocytes: 1 %
Lymphocytes Relative: 9 %
Lymphs Abs: 0.8 10*3/uL (ref 0.7–4.0)
MCH: 32 pg (ref 26.0–34.0)
MCHC: 32.6 g/dL (ref 30.0–36.0)
MCV: 98.2 fL (ref 80.0–100.0)
Monocytes Absolute: 0.9 10*3/uL (ref 0.1–1.0)
Monocytes Relative: 11 %
Neutro Abs: 6.3 10*3/uL (ref 1.7–7.7)
Neutrophils Relative %: 73 %
Platelets: 415 10*3/uL — ABNORMAL HIGH (ref 150–400)
RBC: 4.41 MIL/uL (ref 4.22–5.81)
RDW: 17.2 % — ABNORMAL HIGH (ref 11.5–15.5)
WBC: 8.5 10*3/uL (ref 4.0–10.5)
nRBC: 0 % (ref 0.0–0.2)

## 2020-02-20 LAB — COMPREHENSIVE METABOLIC PANEL
ALT: 32 U/L (ref 0–44)
AST: 43 U/L — ABNORMAL HIGH (ref 15–41)
Albumin: 3.4 g/dL — ABNORMAL LOW (ref 3.5–5.0)
Alkaline Phosphatase: 133 U/L — ABNORMAL HIGH (ref 38–126)
Anion gap: 10 (ref 5–15)
BUN: 9 mg/dL (ref 6–20)
CO2: 21 mmol/L — ABNORMAL LOW (ref 22–32)
Calcium: 9.2 mg/dL (ref 8.9–10.3)
Chloride: 106 mmol/L (ref 98–111)
Creatinine, Ser: 0.75 mg/dL (ref 0.61–1.24)
GFR, Estimated: >60 mL/min (ref >60)
Glucose, Bld: 99 mg/dL (ref 70–99)
Potassium: 3.8 mmol/L (ref 3.5–5.1)
Sodium: 137 mmol/L (ref 135–145)
Total Bilirubin: 0.8 mg/dL (ref 0.3–1.2)
Total Protein: 7 g/dL (ref 6.5–8.1)

## 2020-02-20 LAB — RESP PANEL BY RT-PCR (FLU A&B, COVID) ARPGX2
Influenza A by PCR: NEGATIVE
Influenza B by PCR: NEGATIVE
SARS Coronavirus 2 by RT PCR: NEGATIVE

## 2020-02-20 LAB — PROTIME-INR
INR: 1.1 (ref 0.8–1.2)
Prothrombin Time: 13.8 seconds (ref 11.4–15.2)

## 2020-02-20 MED ORDER — PANTOPRAZOLE SODIUM 40 MG PO TBEC: 40.0000 mg | DELAYED_RELEASE_TABLET | Freq: Two times a day (BID) | ORAL | Status: DC | PRN

## 2020-02-20 MED ORDER — ALPRAZOLAM 0.25 MG PO TABS
0.2500 mg | ORAL_TABLET | Freq: Every day | ORAL | Status: DC
  Administered 2020-02-21 – 2020-02-28 (×9): 0.25 mg via ORAL
  Filled 2020-02-20 (×10): qty 1

## 2020-02-20 MED ORDER — GABAPENTIN 300 MG PO CAPS
300.0000 mg | ORAL_CAPSULE | Freq: Three times a day (TID) | ORAL | Status: DC
  Administered 2020-02-21 – 2020-02-29 (×26): 300 mg via ORAL
  Filled 2020-02-20 (×26): qty 1

## 2020-02-20 MED ORDER — BENZONATATE 100 MG PO CAPS
200.0000 mg | ORAL_CAPSULE | Freq: Three times a day (TID) | ORAL | Status: DC
  Administered 2020-02-21 – 2020-02-29 (×26): 200 mg via ORAL
  Filled 2020-02-20 (×26): qty 2

## 2020-02-20 MED ORDER — FENTANYL CITRATE (PF) 100 MCG/2ML IJ SOLN
12.5000 ug | INTRAMUSCULAR | Status: DC | PRN
  Administered 2020-02-21 (×2): 50 ug via INTRAVENOUS
  Filled 2020-02-20 (×2): qty 2

## 2020-02-20 MED ORDER — ZOLPIDEM TARTRATE 5 MG PO TABS
10.0000 mg | ORAL_TABLET | Freq: Every day | ORAL | Status: DC
  Administered 2020-02-21 – 2020-02-28 (×9): 10 mg via ORAL
  Filled 2020-02-20 (×9): qty 2

## 2020-02-20 MED ORDER — LIDOCAINE-EPINEPHRINE 1 %-1:100000 IJ SOLN
10.0000 mL | Freq: Once | INTRAMUSCULAR | Status: DC
  Filled 2020-02-20: qty 1

## 2020-02-20 MED ORDER — ALPRAZOLAM 0.25 MG PO TABS
0.2500 mg | ORAL_TABLET | Freq: Every day | ORAL | Status: DC | PRN
  Administered 2020-02-28: 0.25 mg via ORAL

## 2020-02-20 MED ORDER — ONDANSETRON HCL 4 MG/2ML IJ SOLN: 4.0000 mg | Freq: Four times a day (QID) | INTRAMUSCULAR | Status: DC | PRN

## 2020-02-20 MED ORDER — BENZONATATE 200 MG PO CAPS: 200.0000 mg | ORAL_CAPSULE | Freq: Three times a day (TID) | ORAL | 0 refills | Status: AC

## 2020-02-20 MED ORDER — PROCHLORPERAZINE MALEATE 10 MG PO TABS
10.0000 mg | ORAL_TABLET | Freq: Four times a day (QID) | ORAL | Status: DC | PRN
  Filled 2020-02-20: qty 1

## 2020-02-20 MED ORDER — ALPRAZOLAM 0.25 MG PO TABS: 0.2500 mg | ORAL_TABLET | ORAL | Status: DC

## 2020-02-20 MED ORDER — HYDROCODONE-HOMATROPINE 5-1.5 MG/5ML PO SYRP
5.0000 mL | ORAL_SOLUTION | Freq: Four times a day (QID) | ORAL | Status: DC | PRN
  Administered 2020-02-21 – 2020-02-29 (×25): 5 mL via ORAL
  Filled 2020-02-20 (×25): qty 5

## 2020-02-20 MED ORDER — OXYCODONE HCL 5 MG PO TABS
5.0000 mg | ORAL_TABLET | Freq: Three times a day (TID) | ORAL | Status: DC | PRN
  Administered 2020-02-21 – 2020-02-23 (×3): 5 mg via ORAL
  Filled 2020-02-20 (×4): qty 1

## 2020-02-20 MED ORDER — SODIUM CHLORIDE 0.9 % IV SOLN: INTRAVENOUS | Status: DC

## 2020-02-20 MED ORDER — HYDROCODONE-HOMATROPINE 5-1.5 MG/5ML PO SYRP: 10.0000 mL | ORAL_SOLUTION | Freq: Four times a day (QID) | ORAL | 0 refills | Status: AC | PRN

## 2020-02-20 MED ORDER — ESCITALOPRAM OXALATE 10 MG PO TABS
10.0000 mg | ORAL_TABLET | Freq: Every day | ORAL | Status: DC
  Administered 2020-02-21 – 2020-02-28 (×9): 10 mg via ORAL
  Filled 2020-02-20 (×9): qty 1

## 2020-02-20 MED ORDER — ONDANSETRON HCL 4 MG PO TABS: 4.0000 mg | ORAL_TABLET | Freq: Four times a day (QID) | ORAL | Status: DC | PRN

## 2020-02-20 MED ORDER — HEPARIN SODIUM (PORCINE) 5000 UNIT/ML IJ SOLN
5000.0000 [IU] | Freq: Three times a day (TID) | INTRAMUSCULAR | Status: DC
  Administered 2020-02-21 – 2020-02-22 (×5): 5000 [IU] via SUBCUTANEOUS
  Filled 2020-02-20 (×5): qty 1

## 2020-02-20 NOTE — ED Notes (Signed)
RN paged MD for possible cough suppressant for pt

## 2020-02-20 NOTE — ED Triage Notes (Signed)
Patient sent from [facility] with pneumothorax, patient has hx of lung cancer and had pneumo last week. Patient alert and in no distress

## 2020-02-20 NOTE — Patient Instructions (Signed)
[facility] at [facility] Discharge Instructions  You were seen today by Dr. Delton Coombes. He went over your recent results and scans. Keep your appointment with Dr. Cyndia Bent on February 9th. Dr. Delton Coombes will see you back in for labs and follow up.   Thank you for choosing [facility] to provide your oncology and hematology care.  To afford each patient quality time with our provider, please arrive at least 15 minutes before your scheduled appointment time.   If you have a lab appointment with the [facility] please come in thru the Main Entrance and check in at the main information desk  You need to re-schedule your appointment should you arrive 10 or more minutes late.  We strive to give you quality time with our providers, and arriving late affects you and other patients whose appointments are after yours.  Also, if you no show three or more times for appointments you may be dismissed from the clinic at the providers discretion.     Again, thank you for choosing [facility].  Our hope is that these requests will decrease the amount of time that you wait before being seen by our physicians.       _____________________________________________________________  Should you have questions after your visit to [facility], please contact our office at [phone] between the hours of 8:00 a.m. and 4:30 p.m.  Voicemails left after 4:00 p.m. will not be returned until the following business day.  For prescription refill requests, have your pharmacy contact our office and allow 72 hours.    Cancer Center Support Programs:   > Cancer Support Group  2nd Tuesday of the month 1pm-2pm, Journey Room

## 2020-02-20 NOTE — Progress Notes (Signed)
Leonard [name] [address] [place] [address]   CLINIC:  Medical Oncology/Hematology  PCP:  Celene Squibb, MD [address] Leonard [name] [place] [address] [phone]   REASON FOR VISIT:  Follow-up for metastatic dedifferentiated liposarcoma  PRIOR THERAPY:  1. Doxorubicin x 3 cycles from 05/22/2019 to 07/04/2019. 2. SBRT to right lung 50 Gy in 10 fractions from 11/14/2019 to 11/29/2019. 3. Gemcitabine and docetaxel x 7 cycles from 08/11/2019 to 01/24/2020.  NGS Results: Guardant 360 MSI--normal, TP53 C 277 fs  CURRENT THERAPY: Palliative  BRIEF ONCOLOGIC HISTORY:  Oncology History  Liposarcoma of left lower extremity ([place])  04/25/2019 Initial Diagnosis   Liposarcoma of left lower extremity ([place])   05/22/2019 - 07/04/2019 Chemotherapy   The patient had DOXOrubicin (ADRIAMYCIN) chemo injection 174 mg, 75 mg/m2 = 174 mg, Intravenous,  Once, 3 of 6 cycles Administration: 174 mg (05/22/2019), 174 mg (06/13/2019), 174 mg (07/04/2019) palonosetron (ALOXI) injection 0.25 mg, 0.25 mg, Intravenous,  Once, 3 of 6 cycles Administration: 0.25 mg (05/22/2019), 0.25 mg (06/13/2019), 0.25 mg (07/04/2019) fosaprepitant (EMEND) 150 mg in sodium chloride 0.9 % 145 mL IVPB, 150 mg, Intravenous,  Once, 3 of 6 cycles Administration: 150 mg (05/22/2019), 150 mg (06/13/2019), 150 mg (07/04/2019)  for chemotherapy treatment.    08/11/2019 -  Chemotherapy    Patient is on Treatment Plan: LIPOSARCOMA GEMCITABINE D1,8 / DOCETAXEL D8 Q21D      Liposarcoma with mets to the lungs and ribs  05/15/2019 Initial Diagnosis   Liposarcoma ([place])   05/15/2019 Cancer Staging   Staging form: Primary Cutaneous B-Cell/T-Cell Lymphoma (Non-MF/SS Lymphoma), AJCC 8th Edition - Clinical stage from 05/15/2019: [name], pM1 - Signed by [name], MD on 05/15/2019     CANCER STAGING: Cancer Staging Liposarcoma of left lower extremity ([place]) Staging form: Primary Cutaneous B-Cell/T-Cell Lymphoma  (Non-MF/SS Lymphoma), AJCC 8th Edition - Clinical: No stage assigned - Unsigned  Liposarcoma with mets to the lungs and ribs Staging form: Primary Cutaneous B-Cell/T-Cell Lymphoma (Non-MF/SS Lymphoma), AJCC 8th Edition - Clinical stage from 05/15/2019: [name], pM1 - Signed by [name], MD on 05/15/2019   INTERVAL HISTORY:  [name], a 60 y.o. male, returns for routine follow-up of his metastatic dedifferentiated liposarcoma. Leonard [name] was last seen on 02/07/2020. He was hospitalized from 01/26 to 02/04 for his pneumothorax.  Today he is accompanied by his wife and he reports feeling poorly. His wheezing has worsened and he continues having a cough and SOB, though the SOB is improved from last week. He continues taking Hycodan for his cough, but has not taken Megace since his appetite has improved. He was administered Tessalon in the hospital and his cough responded well.  He will see Dr. Gilford [name] on 02/09 for his pneumothorax; if he continues having recurrent pneumothorax, he will get a pleurodesis.   REVIEW OF SYSTEMS:  Review of Systems  Constitutional: Positive for appetite change and fatigue.  Respiratory: Positive for cough, shortness of breath (improving) and wheezing (worsening).   Gastrointestinal: Positive for diarrhea.  Musculoskeletal: Positive for back pain (5/10 back pain).  All other systems reviewed and are negative.   PAST MEDICAL/SURGICAL HISTORY:  Past Medical History:  Diagnosis Date  . Cancer [facility])    metastatic lung cancer  . Port-A-Cath in place 05/19/2019   right   Past Surgical History:  Procedure Laterality Date  . CHOLECYSTECTOMY    . ESOPHAGEAL DILATION N/A 06/27/2017   Procedure: ESOPHAGEAL DILATION;  Surgeon: Danie Binder, MD;  Location: [facility];  Service: Endoscopy;  Laterality: N/A;  . ESOPHAGOGASTRODUODENOSCOPY N/A 06/27/2017   Procedure: ESOPHAGOGASTRODUODENOSCOPY (EGD);  Surgeon: Danie Binder, MD;  Location: [facility]  SUITE;  Service: Endoscopy;  Laterality: N/A;  . IR IMAGING GUIDED PORT INSERTION  05/04/2019  . knee sx      SOCIAL HISTORY:  Social History   Socioeconomic History  . Marital status: Married    Spouse name: Not on file  . Number of children: 1  . Years of education: Not on file  . Highest education level: Not on file  Occupational History  . Occupation: Unemployed  Tobacco Use  . Smoking status: Former Research scientist (life sciences)  . Smokeless tobacco: Never Used  Vaping Use  . Vaping Use: Never used  Substance and Sexual Activity  . Alcohol use: Yes    Comment: 1-2 drinks per month  . Drug use: Never  . Sexual activity: Not Currently  Other Topics Concern  . Not on file  Social History Narrative   DOES CONSTRUCTION. MARRIED. RARE ETOH. NO TOBACCO.   Social Determinants of Health   Financial Resource Strain: Medium Risk  . Difficulty of Paying Living Expenses: Somewhat hard  Food Insecurity: No Food Insecurity  . Worried About Charity fundraiser in the Last Year: Never true  . Ran Out of Food in the Last Year: Never true  Transportation Needs: No Transportation Needs  . Lack of Transportation (Medical): No  . Lack of Transportation (Non-Medical): No  Physical Activity: Inactive  . Days of Exercise per Week: 0 days  . Minutes of Exercise per Session: 0 min  Stress: Stress Concern Present  . Feeling of Stress : To some extent  Social Connections: Moderately Integrated  . Frequency of Communication with Friends and Family: More than three times a week  . Frequency of Social Gatherings with Friends and Family: Once a week  . Attends Religious Services: More than 4 times per year  . Active Member of Clubs or Organizations: No  . Attends Archivist Meetings: Never  . Marital Status: Married  Human resources officer Violence: Not At Risk  . Fear of Current or Ex-Partner: No  . Emotionally Abused: No  . Physically Abused: No  . Sexually Abused: No    FAMILY HISTORY:  Family  History  Problem Relation Age of Onset  . Heart disease Father   . Cancer Maternal Aunt   . Cancer Maternal Uncle   . Cancer Paternal Uncle   . Vision loss Maternal Grandfather   . Colon cancer Neg Hx   . Colon polyps Neg Hx     CURRENT MEDICATIONS:  Current Outpatient Medications  Medication Sig Dispense Refill  . aluminum-magnesium hydroxide-simethicone (MAALOX) [phone] MG/5ML SUSP Swish and swallow 5 ml four times daily as needed for mouth sores. 600 mL 2  . escitalopram (LEXAPRO) 10 MG tablet Take 1 tablet (10 mg total) by mouth daily. 30 tablet 2  . gabapentin (NEURONTIN) 300 MG capsule Take 1 capsule (300 mg total) by mouth 3 (three) times daily. 90 capsule 1  . megestrol (MEGACE) 400 MG/10ML suspension Take 10 mLs (400 mg total) by mouth 2 (two) times daily. 480 mL 1  . Oxycodone HCl 10 MG TABS Take 0.5 tablets (5 mg total) by mouth every 8 (eight) hours as needed. (Patient taking differently: Take 5 mg by mouth every 8 (eight) hours as needed (severe pain).) 30 tablet 0  . pantoprazole (PROTONIX) 40  MG tablet Take 1 tablet (40 mg total) by mouth 2 (two) times daily before a meal. 60 tablet 0  . prochlorperazine (COMPAZINE) 10 MG tablet TAKE (1) TABLET BY MOUTH EVERY (6) HOURS AS NEEDED. (Patient taking differently: Take 10 mg by mouth every 6 (six) hours as needed for nausea or vomiting.) 30 tablet 0  . zolpidem (AMBIEN) 10 MG tablet Take 1 tablet (10 mg total) by mouth at bedtime as needed for sleep. 10 tablet 0  . ALPRAZolam (XANAX) 0.25 MG tablet Take 1 tablet (0.25 mg total) by mouth 2 (two) times daily as needed for anxiety. (Patient not taking: Reported on 02/20/2020) 60 tablet 3  . benzonatate (TESSALON) 200 MG capsule Take 1 capsule (200 mg total) by mouth 3 (three) times daily. 20 capsule 0  . HYDROcodone-homatropine (HYCODAN) 5-1.5 MG/5ML syrup Take 10 mLs by mouth every 6 (six) hours as needed for cough. SMARTSIG:1 Teaspoon By Mouth Every 6 Hours PRN 480 mL 0   No  current facility-administered medications for this visit.   Facility-Administered Medications Ordered in Other Visits  Medication Dose Route Frequency Provider Last Rate Last Admin  . sodium chloride flush (NS) 0.9 % injection 10 mL  10 mL Intravenous PRN [name], MD   10 mL at 08/21/19 1508    ALLERGIES:  No Known Allergies  PHYSICAL EXAM:  Performance status (ECOG): 1 - Symptomatic but completely ambulatory  Vitals:   02/20/20 1448  BP: 114/90  Pulse: (!) 101  Resp: 20  Temp: 98.1 F (36.7 C)  SpO2: 96%   Wt Readings from Last 3 Encounters:  02/20/20 205 lb (93 kg)  02/16/20 208 lb 3.2 oz (94.4 kg)  02/07/20 205 lb 14.6 oz (93.4 kg)   Physical Exam Vitals reviewed.  Constitutional:      Appearance: Normal appearance.  Cardiovascular:     Rate and Rhythm: Normal rate and regular rhythm.     Pulses: Normal pulses.     Heart sounds: Normal heart sounds.  Pulmonary:     Effort: Pulmonary effort is normal.     Breath sounds: Examination of the left-middle field reveals decreased breath sounds. Examination of the left-lower field reveals decreased breath sounds. Decreased breath sounds present.  Chest:     Comments: Left thoracotomy incisions, anterior incision w/ serosanguinous drainage Neurological:     General: No focal deficit present.     Mental Status: He is alert and oriented to person, place, and time.  Psychiatric:        Mood and Affect: Mood normal.        Behavior: Behavior normal.      LABORATORY DATA:  I have reviewed the labs as listed.  CBC Latest Ref Rng & Units 02/08/2020 02/07/2020 02/07/2020  WBC 4.0 - 10.5 K/uL 7.1 - 9.5  Hemoglobin 13.0 - 17.0 g/dL 10.9(L) 11.2(L) 11.6(L)  Hematocrit 39.0 - 52.0 % 33.9(L) 34.9(L) 36.5(L)  Platelets 150 - 400 K/uL 265 - 274   CMP Latest Ref Rng & Units 02/07/2020 02/07/2020 01/31/2020  Glucose 70 - 99 mg/dL 111(H) 128(H) 139(H)  BUN 6 - 20 mg/dL 12 12 10   Creatinine 0.61 - 1.24 mg/dL 0.63 0.67 0.83   Sodium 135 - 145 mmol/L 136 136 136  Potassium 3.5 - 5.1 mmol/L 3.9 3.6 3.6  Chloride 98 - 111 mmol/L 104 102 101  CO2 22 - 32 mmol/L 27 25 25   Calcium 8.9 - 10.3 mg/dL 8.3(L) 8.5(L) 8.8(L)  Total Protein 6.5 - 8.1 g/dL 5.5(L)  6.3(L) 6.7  Total Bilirubin 0.3 - 1.2 mg/dL 0.6 0.9 0.7  Alkaline Phos 38 - 126 U/L 145(H) 142(H) 169(H)  AST 15 - 41 U/L 27 29 38  ALT 0 - 44 U/L 22 22 33    DIAGNOSTIC IMAGING:  I have independently reviewed the scans and discussed with the patient. DG Chest 2 View  Result Date: 02/15/2020 CLINICAL DATA:  Pneumothorax, recent chest tube removal, history of lung cancer EXAM: CHEST - 2 VIEW COMPARISON:  Radiograph 02/14/2020, CT 02/06/2020 FINDINGS: Accessed right IJ approach Port-A-Cath tip terminates in the lower SVC. Telemetry leads and support devices overlie the chest. Cholecystectomy clips in the right upper quadrant. Persistent subcutaneous emphysema extending across the left chest wall. Suspect small residual left apical visceral pleural line with additional increasing lucency along the left heart and mediastinal borders which could reflect a pneumomediastinum or less likely medial pneumothorax. Small bilateral effusions. Persistent heterogeneous opacities are again seen in the mid to lower lungs with additional bilateral pulmonary masses, largest in the left lung apex. No acute or worrisome osseous abnormality. IMPRESSION: 1. Suspect small residual left apical visceral pleural line/apical pneumothorax. Additional increasing lucency along the left heart and mediastinal borders which could reflect a pneumomediastinum or less likely medial pneumothorax component. Small bilateral effusions. 2. Extensive subcutaneous emphysema remains of the left chest wall. 3. Persistent heterogeneous opacities in the mid to lower lungs, favor edema and atelectasis. 4. Redemonstrated pulmonary masses, largest in the left apex. Electronically Signed   By: Lovena Le M.D.   On: 02/15/2020  06:46   DG Chest 2 View  Result Date: 02/14/2020 CLINICAL DATA:  Pneumothorax EXAM: CHEST - 2 VIEW COMPARISON:  Chest x-ray 02/13/2020 FINDINGS: Right chest wall Port-A-Cath and left chest tube in stable appropriate position. The heart size and mediastinal contours are within normal limits. Persistent small apical left pneumothorax. Persistent bilateral pulmonary masses again noted. Possible trace right pleural effusion. No definite left pleural effusion. No acute osseous abnormality. IMPRESSION: 1. Grossly stable small left apical pneumothorax. 2. Trace right pleural effusion. 3. Persistent bilateral pulmonary masses. 4. Lines and tubes in stable position. Electronically Signed   By: Iven Finn M.D.   On: 02/14/2020 06:54   DG Chest 2 View  Result Date: 02/13/2020 CLINICAL DATA:  Chest tube. EXAM: CHEST - 2 VIEW COMPARISON:  02/12/2020. FINDINGS: Left chest tube in stable position. Left pneumothorax improved from prior exam with tiny residual left apical pneumothorax. Persistent bilateral pulmonary masses again noted. Small right pleural effusion. Heart size stable. Degenerative change thoracic spine. IMPRESSION: 1. Left chest tube in stable position. Left pneumothorax improved from prior exam with tiny residual left apical pneumothorax. 2. Persistent bilateral pulmonary masses again noted. Small right pleural effusion. Electronically Signed   By: Marcello Moores  Register   On: 02/13/2020 07:00   CT CHEST ABDOMEN PELVIS W CONTRAST  Result Date: 02/06/2020 CLINICAL DATA:  60 year old male with history of liposarcoma in the left lower extremity. EXAM: CT CHEST, ABDOMEN, AND PELVIS WITH CONTRAST TECHNIQUE: Multidetector CT imaging of the chest, abdomen and pelvis was performed following the standard protocol during bolus administration of intravenous contrast. CONTRAST:  159m OMNIPAQUE IOHEXOL 300 MG/ML  SOLN COMPARISON:  CT the chest, abdomen and pelvis 11/01/2019. FINDINGS: CT CHEST FINDINGS Cardiovascular:  Heart size is normal. Trace amount of pericardial fluid and/or thickening, unlikely to be of hemodynamic significance at this time. No associated pericardial calcification. There is aortic atherosclerosis, as well as atherosclerosis of the great vessels of the  mediastinum and the coronary arteries, including calcified atherosclerotic plaque in the left main, left anterior descending and right coronary arteries.Right internal jugular single-lumen porta cath with tip terminating at the superior cavoatrial junction. Mediastinum/Nodes: No pathologically enlarged mediastinal or hilar lymph nodes. Esophagus is unremarkable in appearance. No axillary lymphadenopathy. Lungs/Pleura: Multiple large pulmonary nodules and masses are again noted in the lungs bilaterally, generally increased in size compared to the prior examination. The largest of these is in the left upper lobe near the apex (axial image 17 of series 2) measuring 5.5 x 5.6 cm, significantly increased from 2.1 x 1.8 cm on the prior examination. This lesion is centrally low-attenuation, suggesting internal areas of necrosis. Small bilateral pleural effusions. New large left pneumothorax with considerable atelectasis in the left lung. Musculoskeletal: There are no aggressive appearing lytic or blastic lesions noted in the visualized portions of the skeleton. CT ABDOMEN PELVIS FINDINGS Hepatobiliary: No suspicious cystic or solid hepatic lesions. No intra or extrahepatic biliary ductal dilatation. Status post cholecystectomy. Pancreas: No pancreatic mass. No pancreatic ductal dilatation. No pancreatic or peripancreatic fluid collections or inflammatory changes. Spleen: Unremarkable. Adrenals/Urinary Tract: In the upper pole the left kidney there is enhancing lesion measuring 1.7 cm in diameter, increased in size compared to the prior study (previously 1.2 cm), concerning for neoplasm. Exophytic 1.2 cm low-attenuation lesion in the lower pole the left kidney,  compatible with a small simple cyst. Right kidney and bilateral adrenal glands are normal in appearance. No hydroureteronephrosis. Urinary bladder is normal in appearance. Stomach/Bowel: The appearance of the stomach is normal. There is no pathologic dilatation of small bowel or colon. Normal appendix. Vascular/Lymphatic: Aortic atherosclerosis, without evidence of aneurysm or dissection in the abdominal or pelvic vasculature. No lymphadenopathy noted in the abdomen or pelvis. Reproductive: Prostate gland and seminal vesicles are unremarkable in appearance. Other: No significant volume of ascites.  No pneumoperitoneum. Musculoskeletal: There are no aggressive appearing lytic or blastic lesions noted in the visualized portions of the skeleton. IMPRESSION: 1. New large left hydropneumothorax. 2. Widespread metastatic disease to the lungs appears progressive compared to the prior study, as above. 3. Enlarging probably enhancing lesion in the upper pole the left kidney concerning for neoplasm, either metastatic or primary. 4. Small right pleural effusion. 5. Aortic atherosclerosis, in addition to left main and 2 vessel coronary artery disease. Please note that although the presence of coronary artery calcium documents the presence of coronary artery disease, the severity of this disease and any potential stenosis cannot be assessed on this non-gated CT examination. Assessment for potential risk factor modification, dietary therapy or pharmacologic therapy may be warranted, if clinically indicated. 6. Additional incidental findings, as above. Critical Value/emergent results were called by telephone at the time of interpretation on 02/06/2020 at 11:35 am to provider [facility] , who verbally acknowledged these results. Electronically Signed   By: Vinnie Langton M.D.   On: 02/06/2020 11:36   DG Chest Port 1 View  Result Date: 02/12/2020 CLINICAL DATA:  60 year old male with history of pneumothorax status post  chest tube placement. EXAM: PORTABLE CHEST 1 VIEW COMPARISON:  Chest x-ray 02/09/2020. FINDINGS: Right internal jugular single-lumen power porta cath with tip terminating at the superior cavoatrial junction. Left-sided chest tube in position with tip and side port projecting over the lower left hemithorax. Small persistent left pneumothorax, increased slightly in size compared to the prior study. No right-sided pneumothorax. Multiple pulmonary nodules and masses again noted, better demonstrated on prior chest CT 02/06/2020. No definite acute  consolidative airspace disease. No pleural effusions. No evidence of pulmonary edema. Heart size is normal. Upper mediastinal contours are within normal limits. Extensive subcutaneous emphysema in the left chest wall again noted. IMPRESSION: 1. Support apparatus, as above. 2. Increased size of small left-sided pneumothorax. 3. Multiple pulmonary nodules and masses, similar to prior examinations, presumably reflective of metastatic disease. Electronically Signed   By: Vinnie Langton M.D.   On: 02/12/2020 08:16   DG CHEST PORT 1 VIEW  Result Date: 02/09/2020 CLINICAL DATA:  Follow-up hydropneumothorax. Status post chest tube removal. EXAM: PORTABLE CHEST 1 VIEW COMPARISON:  02/09/2020 FINDINGS: Right chest wall port a catheter is noted with tip at the cavoatrial junction. The left-sided chest tube has been removed. Small left apical pneumothorax persists. Unchanged appearance of gas within the soft tissues of the left chest wall. Masslike opacities within the left upper lobe and right base are unchanged. IMPRESSION: Persistent small left apical pneumothorax status post left-sided chest tube removal. Electronically Signed   By: Kerby Moors M.D.   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 Recent left pneumothorax. EXAM: PORTABLE CHEST 1 VIEW COMPARISON:  Portable chest 02/08/2020 and  earlier. FINDINGS: Portable AP upright view at 0816 hours. Left chest tube is in place although there may be side holes outside of the left pleural space (arrow). A small volume of left chest wall gas has increased. Small residual left pneumothorax. No mediastinal shift. Masslike bilateral pulmonary opacities compatible with known metastases. Stable cardiac size and mediastinal contours. Visualized tracheal air column is within normal limits. Stable right chest porta cath. Negative visible bowel gas pattern. Stable visualized osseous structures. IMPRESSION: 1. Left chest tube in place although there may be tube side holes outside of the pleural space. A small volume of chest wall gas has increased. 2. Stable small residual pneumothorax. 3. Stable pulmonary metastases. Electronically Signed   By: Genevie Ann M.D.   On: 02/09/2020 08:25   DG CHEST PORT 1 VIEW  Result Date: 02/08/2020 CLINICAL DATA:  Hydropneumothorax, chest tube on LEFT EXAM: PORTABLE CHEST 1 VIEW COMPARISON:  Portable exam 5681 hours compared to 02/07/2020 FINDINGS: Pigtail RIGHT thoracostomy tube, partially withdrawn since previous exam with tip projecting at LEFT costal margin. Persistent small LEFT apex pneumothorax. Basilar LEFT pleural effusion. Normal heart size mediastinal contours. RIGHT jugular Port-A-Cath stable tip projecting over SVC. Atelectasis versus infiltrate at RIGHT base. BILATERAL pulmonary nodules, largest a mass at LEFT apex. Bones demineralized. IMPRESSION: Persistent small LEFT hydropneumothorax. LEFT thoracostomy tube has been partially withdrawn with the pigtail now at LEFT costal margin. Electronically Signed   By: Lavonia Dana M.D.   On: 02/08/2020 15:11   DG Chest Portable 1 View  Result Date: 02/07/2020 CLINICAL DATA:  Follow-up left pneumothorax.  Lung carcinoma. EXAM: PORTABLE CHEST 1 VIEW COMPARISON:  Prior today FINDINGS: There has been placement of a left pleural pigtail catheter since previous study, with near  complete resolution of left pneumothorax since prior exam. Multiple pulmonary nodules and masses are again seen in both lungs, largest in the left upper lobe and right lower lobe. Small right pleural effusion remains stable. Right-sided Port-A-Cath remains in appropriate position. IMPRESSION: Near complete resolution of left pneumothorax following left pleural catheter placement. Electronically Signed   By: Marlaine Hind M.D.   On: 02/07/2020 14:55   DG Chest Portable 1 View  Result Date: 02/07/2020 CLINICAL DATA:  Left pneumothorax. EXAM:  PORTABLE CHEST 1 VIEW COMPARISON:  CT 02/06/2020. FINDINGS: Mediastinum hilar structures normal. Borderline cardiomegaly. Large left pneumothorax noted. Similar finding noted on prior CT. Prominent atelectasis left lung. Multiple prominent bilateral pulmonary masses again noted as noted on prior CT. No acute bony abnormality identified. Contrast in the colon. IMPRESSION: 1. Large left pneumothorax. Similar finding noted on prior CT. Prominent atelectasis left lung. 2. Multiple prominent bilateral pulmonary masses again noted as noted on prior CT. Electronically Signed   By: Marcello Moores  Register   On: 02/07/2020 13:50   DG Chest Port 1V same Day  Result Date: 02/09/2020 CLINICAL DATA:  Shortness of breath, history of metastatic lung carcinoma EXAM: PORTABLE CHEST 1 VIEW COMPARISON:  Film from earlier in the same day. FINDINGS: Cardiac shadow is stable. Left-sided chest tube is again identified. Small left apical pneumothorax is again identified stable in appearance. Stable left upper lobe mass lesion is noted. Nodules are noted in the right lung as well as patchy atelectatic changes in the bases bilaterally. Considerable subcutaneous emphysema is noted. No bony abnormality is seen. Right chest wall port is noted. IMPRESSION: Stable left apical pneumothorax with chest tube in place. Changes of metastatic disease within the lungs. Electronically Signed   By: Inez Catalina M.D.    On: 02/09/2020 23:23   DG Chest Port 1V same Day  Result Date: 02/09/2020 CLINICAL DATA:  Pneumothorax.  Chest tube placement. EXAM: PORTABLE CHEST 1 VIEW COMPARISON:  February 09, 2020 FINDINGS: There has been interval placement of a left-sided chest tube. There is a small residual left-sided pneumothorax, improved from prior study. The right-sided Port-A-Cath is well position. Bilateral airspace opacities are again noted, not substantially changed from prior study. There is subcutaneous emphysema along the patient's left flank, stable to improved from prior study. The heart size is unchanged. IMPRESSION: 1. Interval placement of a left-sided chest tube. 2. Small residual left-sided pneumothorax, improved from prior study. 3. Otherwise, no significant short interval change. Electronically Signed   By: Constance Holster M.D.   On: 02/09/2020 16:25   DG Chest Port 1V same Day  Result Date: 02/09/2020 CLINICAL DATA:  Chest tube removal EXAM: PORTABLE CHEST 1 VIEW COMPARISON:  02/09/2020 FINDINGS: Further increased size of the left pneumothorax, now noted laterally and at the left apex, approximately 15%. Right Port-A-Cath remains in place, unchanged. Airspace opacity at the right lung base is unchanged. Heart is borderline in size. Subcutaneous emphysema throughout the left chest wall. IMPRESSION: Enlarging left pneumothorax, now approximately 15%. Otherwise no change. These results will be called to the ordering clinician or representative by the Radiologist Assistant, and communication documented in the PACS or [employer]. Electronically Signed   By: Rolm Baptise M.D.   On: 02/09/2020 15:33     ASSESSMENT:  1. 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 -Chemoradiation therapy with gemcitabine and docetaxel followed by wide local excision. -Pathology on 11/10/2018 showed  liposarcoma with 80% necrosis, grade 2, ypT4, NX, positive deep margin, amplification of MDM 2 detected. Amplification is associated with well-differentiated liposarcoma/atypical lipomatous tumors and dedifferentiated liposarcoma. -CT CAP in the ER on 04/24/2019 showed widespread pulmonary metastatic lesions throughout both lungs, largest left upper lobe mass measuring 4.7 x 4.2 cm. Mass in the inferior lingula measures 4.12 x 3.2 cm. -Bone scan on 05/02/2019 did not show any evidence of metastatic disease. -2D echo on 05/02/2019 shows EF 60 to 65%. -Adriamycin every 3  weeks started on 05/22/2019. -CT CAP on 07/19/2019 showed dominant lung lesions have decreased in size. 2 nodules in the right major fissure have progressed while some other pulmonary nodules are unchanged. No evidence of metastatic disease in the abdomen or pelvis. -Gemcitabine and docetaxel every 21 days started on 08/11/2019. -CT CAP on 11/01/2019 showed interval improvement in the pulmonary metastatic disease but new 5 mm nodule in the right upper lobe. Right lower lobe lesion has increased in volume. Lytic focus in the left posterior fourth rib is slightly prominent. Hepatic steatosis. -Right lung IMRT, 5 Gy, 10 fractions.  2. Left kidney lesion: -CT on 04/24/2019 showed mass arising in the periphery of the upper lobe of the left kidney measuring 1.3 x 1.4 cm. -CT scan from [facility] in August 2020 was showing left kidney lesion. -Current scan on 07/19/2019 showed 15 mm hypoattenuating lesion in the upper pole left kidney stable since prior.   PLAN:  1. Metastatic dedifferentiated liposarcoma: -We reviewed guardant 360 results which showed T p53 mutation. -No targetable mutations. -He complains of breathing difficulty which started couple of days ago.  Wife reports that he has been wheezing occasionally. -I have done chest x-ray in our office today.  There is recurrence of pneumothorax.  I have called radiologist  and talk to him.  I was informed that there is at least 30% pneumothorax. -We have called the emergency room and transferred him for chest tube placement and pleurodesis.  2. Dry cough: -We will continue hydrocodone syrup every 4-6 hours as needed. -He will also continue [employer] which is helping.  3. Anxiety: -Continue Xanax as needed.  4.  Bilateral rib pain: -He still has some pains when he coughs.  5. Weight loss: -Continue Megace 400 mg twice daily.  6. Depression: -Continue Lexapro daily.  7. Sleeping difficulty: -Continue Ambien at bedtime as needed.   Orders placed this encounter:  No orders of the defined types were placed in this encounter.    [name], MD [place] [phone]   I, Milinda Antis, am acting as a scribe for Dr. Sanda Linger.  I, [name] MD, have reviewed the above documentation for accuracy and completeness, and I agree with the above.

## 2020-02-20 NOTE — H&P (Signed)
History and Physical   Leonard [name] EXH:[id number] DOB: [birth year] DOA: 02/20/2020  Referring MD/NP/PA: Dr. Francia Greaves  PCP: Celene Squibb, MD   Outpatient Specialists: Oncology, [facility]  Patient coming from: Home  Chief Complaint: Shortness of breath and chest pain  HPI: Leonard [name] is a 60 y.o. 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  At that time he had more than 50% left-sided pneumothorax that was spontaneous.  Patient apparently has been doing fine until this morning when he had a cough and started feeling more shortness of breath and pain on the left side of the chest.  He went to see his oncologist where he was seen evaluated and x-ray showed reaccumulation of the hemopneumothorax.  He was sent over to the hospital for further evaluation and treatment.  Patient had ER consultation with cardiothoracic surgery.  He has hydropneumothorax which is associated with a necrotic malignant mass.  He was offered a chance to reinsert the chest to versus observation.  Patient has chosen not to have the chest tube reinserted.  Recommendations to admit the patient for observation.  Repeat chest x-ray in the morning and if he gets worse further discussions regarding Pleurx catheter or reinsertion of chest tube will be made.  He is hemodynamically stable at the moment no obvious distress has some pain and has continued to have some cough.  Patient therefore will be admitted to the hospital with cardiothoracic surgery follow-up..  ED Course: Temperature 98.9, blood pressure 114/89 pulse 101, respiratory rate 22 oxygen sat 94% room air.  White count 8.5 hemoglobin 14.9 and platelets 415.  Chemistry shows CO2 21 otherwise the rest appears to be his within normal.  COVID-19 is negative.  Chest x-ray showed persistent bilateral airspace  opacity in addition to a moderate-sized left sided hydropneumothorax.  Patient will be admitted to the hospital for observation and monitoring of the pneumothorax.  Review of Systems: As per HPI otherwise 10 point review of systems negative.    Past Medical History:  Diagnosis Date  . Cancer [facility])    metastatic lung cancer  . Port-A-Cath in place 05/19/2019   right    Past Surgical History:  Procedure Laterality Date  . CHOLECYSTECTOMY    . ESOPHAGEAL DILATION N/A 06/27/2017   Procedure: ESOPHAGEAL DILATION;  Surgeon: Danie Binder, MD;  Location: [facility]O [facility];  Service: Endoscopy;  Laterality: N/A;  . ESOPHAGOGASTRODUODENOSCOPY N/A 06/27/2017   Procedure: ESOPHAGOGASTRODUODENOSCOPY (EGD);  Surgeon: Danie Binder, MD;  Location: [facility] ENDO SUITE;  Service: Endoscopy;  Laterality: N/A;  . IR IMAGING GUIDED PORT INSERTION  05/04/2019  . knee sx       reports that he has quit smoking. He has never used smokeless tobacco. He reports current alcohol use. He reports that he does not use drugs.  No Known Allergies  Family History  Problem Relation Age of Onset  . Heart disease Father   . Cancer Maternal Aunt   . Cancer Maternal Uncle   . Cancer Paternal Uncle   . Vision loss Maternal Grandfather   . Colon cancer Neg Hx   . Colon polyps Neg Hx      Prior to Admission medications   Medication Sig Start Date End Date Taking? Authorizing Provider  ALPRAZolam (XANAX) 0.25 MG tablet Take 1 tablet (0.25 mg total)  by mouth 2 (two) times daily as needed for anxiety. Patient taking differently: Take 0.25 mg by mouth See admin instructions. Take 0.25 mg by mouth at bedtime and an additional 0.25 mg once a day as needed for anxiety 10/30/19  Yes [name], MD  benzonatate (TESSALON) 200 MG capsule Take 1 capsule (200 mg total) by mouth 3 (three) times daily. 02/20/20  Yes [name], MD  escitalopram (LEXAPRO) 10 MG tablet Take 1 tablet (10 mg total) by mouth  daily. Patient taking differently: Take 10 mg by mouth at bedtime. 12/25/19  Yes Jacquelin Hawking, NP  gabapentin (NEURONTIN) 300 MG capsule Take 1 capsule (300 mg total) by mouth 3 (three) times daily. Patient taking differently: Take 300 mg by mouth in the morning and at bedtime. 02/16/20 03/17/20 Yes Mikhail, Velta Addison, DO  HYDROcodone-homatropine (HYCODAN) 5-1.5 MG/5ML syrup Take 10 mLs by mouth every 6 (six) hours as needed for cough. SMARTSIG:1 Teaspoon By Mouth Every 6 Hours PRN Patient taking differently: Take 10 mLs by mouth every 6 (six) hours as needed for cough. 02/20/20  Yes [name], MD  Oxycodone HCl 10 MG TABS Take 0.5 tablets (5 mg total) by mouth every 8 (eight) hours as needed. Patient taking differently: Take 5 mg by mouth every 8 (eight) hours as needed (for moderate to severe pain). 01/03/20  Yes Tanner, Lyndon Code., PA-C  pantoprazole (PROTONIX) 40 MG tablet Take 1 tablet (40 mg total) by mouth 2 (two) times daily before a meal. Patient taking differently: Take 40 mg by mouth 2 (two) times daily as needed (for reflux). 02/16/20 03/17/20 Yes Mikhail, Velta Addison, DO  prochlorperazine (COMPAZINE) 10 MG tablet TAKE (1) TABLET BY MOUTH EVERY (6) HOURS AS NEEDED. Patient taking differently: Take 10 mg by mouth every 6 (six) hours as needed for nausea or vomiting. 01/02/20  Yes Iruku, Arletha Pili, MD  zolpidem (AMBIEN) 10 MG tablet Take 1 tablet (10 mg total) by mouth at bedtime as needed for sleep. Patient taking differently: Take 10 mg by mouth at bedtime. 01/31/20 03/01/20 Yes [name], MD  aluminum-magnesium hydroxide-simethicone (MAALOX) 200-200-20 MG/5ML SUSP Swish and swallow 5 ml four times daily as needed for mouth sores. Patient not taking: No sig reported 09/21/19   [name], MD  megestrol (MEGACE) 400 MG/10ML suspension Take 10 mLs (400 mg total) by mouth 2 (two) times daily. Patient not taking: No sig reported 01/31/20   [name], MD    Physical  Exam: Vitals:   02/20/20 2100 02/20/20 2115 02/20/20 2130 02/20/20 2145  BP: (!) 134/92 (!) 129/92 (!) 122/91 (!) 129/93  Pulse: 88 85 88 87  Resp: (!) 21 17 (!) 21 18  Temp:      SpO2: 97% 98% 97% 97%  Weight:      Height:          Constitutional: Acutely ill looking, no distress Vitals:   02/20/20 2100 02/20/20 2115 02/20/20 2130 02/20/20 2145  BP: (!) 134/92 (!) 129/92 (!) 122/91 (!) 129/93  Pulse: 88 85 88 87  Resp: (!) 21 17 (!) 21 18  Temp:      SpO2: 97% 98% 97% 97%  Weight:      Height:       Eyes: PERRL, lids and conjunctivae normal ENMT: Mucous membranes are moist. Posterior pharynx clear of any exudate or lesions.Normal dentition.  Neck: normal, supple, no masses, no thyromegaly Respiratory: Decreased air entry on the left with some rhonchi and rales, no wheezing, no crackles. Normal respiratory  effort. No accessory muscle use.  Cardiovascular: Regular rate and rhythm, no murmurs / rubs / gallops. No extremity edema. 2+ pedal pulses. No carotid bruits.  Abdomen: no tenderness, no masses palpated. No hepatosplenomegaly. Bowel sounds positive.  Musculoskeletal: no clubbing / cyanosis. No joint deformity upper and lower extremities. Good ROM, no contractures. Normal muscle tone.  Skin: no rashes, lesions, ulcers. No induration Neurologic: CN 2-12 grossly intact. Sensation intact, DTR normal. Strength 5/5 in all 4.  Psychiatric: Normal judgment and insight. Alert and oriented x 3. Normal mood.     Labs on Admission: I have personally reviewed following labs and imaging studies  CBC: Recent Labs  Lab 02/20/20 1811  WBC 8.5  NEUTROABS 6.3  HGB 14.1  HCT 43.3  MCV 98.2  PLT 914*   Basic Metabolic Panel: Recent Labs  Lab 02/20/20 1811  NA 137  K 3.8  CL 106  CO2 21*  GLUCOSE 99  BUN 9  CREATININE 0.75  CALCIUM 9.2   GFR: Estimated Creatinine Clearance: 112.5 mL/min (by C-G formula based on SCr of 0.75 mg/dL). Liver Function Tests: Recent Labs   Lab 02/20/20 1811  AST 43*  ALT 32  ALKPHOS 133*  BILITOT 0.8  PROT 7.0  ALBUMIN 3.4*   No results for input(s): LIPASE, AMYLASE in the last 168 hours. No results for input(s): AMMONIA in the last 168 hours. Coagulation Profile: Recent Labs  Lab 02/20/20 1811  INR 1.1   Cardiac Enzymes: No results for input(s): CKTOTAL, CKMB, CKMBINDEX, TROPONINI in the last 168 hours. BNP (last 3 results) No results for input(s): PROBNP in the last 8760 hours. HbA1C: No results for input(s): HGBA1C in the last 72 hours. CBG: No results for input(s): GLUCAP in the last 168 hours. Lipid Profile: No results for input(s): CHOL, HDL, LDLCALC, TRIG, CHOLHDL, LDLDIRECT in the last 72 hours. Thyroid Function Tests: No results for input(s): TSH, T4TOTAL, FREET4, T3FREE, THYROIDAB in the last 72 hours. Anemia Panel: No results for input(s): VITAMINB12, FOLATE, FERRITIN, TIBC, IRON, RETICCTPCT in the last 72 hours. Urine analysis: No results found for: COLORURINE, APPEARANCEUR, LABSPEC, PHURINE, GLUCOSEU, HGBUR, BILIRUBINUR, KETONESUR, PROTEINUR, UROBILINOGEN, NITRITE, LEUKOCYTESUR Sepsis Labs: @LABRCNTIP (procalcitonin:4,lacticidven:4) ) Recent Results (from the past 240 hour(s))  Resp Panel by RT-PCR (Flu A&B, Covid) Nasopharyngeal Swab     Status: None   Collection Time: 02/20/20  6:30 PM   Specimen: Nasopharyngeal Swab; Nasopharyngeal(NP) swabs in vial transport medium  Result Value Ref Range Status   SARS Coronavirus 2 by RT PCR NEGATIVE NEGATIVE Final    Comment: (NOTE) SARS-CoV-2 target nucleic acids are NOT DETECTED.  The SARS-CoV-2 RNA is generally detectable in upper respiratory specimens during the acute phase of infection. The lowest concentration of SARS-CoV-2 viral copies this assay can detect is 138 copies/mL. A negative result does not preclude SARS-Cov-2 infection and should not be used as the sole basis for treatment or other patient management decisions. A negative result  may occur with  improper specimen collection/handling, submission of specimen other than nasopharyngeal swab, presence of viral mutation(s) within the areas targeted by this assay, and inadequate number of viral copies(<138 copies/mL). A negative result must be combined with clinical observations, patient history, and epidemiological information. The expected result is Negative.  Fact Sheet for Patients:  EntrepreneurPulse.com.au  Fact Sheet for Healthcare Providers:  IncredibleEmployment.be  This test is no t yet approved or cleared by the [place] FDA and  has been authorized for detection and/or diagnosis of SARS-CoV-2 by FDA under  an Emergency Use Authorization (EUA). This EUA will remain  in effect (meaning this test can be used) for the duration of the COVID-19 declaration under Section 564(b)(1) of the Act, 21 U.S.C.section 360bbb-3(b)(1), unless the authorization is terminated  or revoked sooner.       Influenza A by PCR NEGATIVE NEGATIVE Final   Influenza B by PCR NEGATIVE NEGATIVE Final    Comment: (NOTE) The Xpert Xpress SARS-CoV-2/FLU/RSV plus assay is intended as an aid in the diagnosis of influenza from Nasopharyngeal swab specimens and should not be used as a sole basis for treatment. Nasal washings and aspirates are unacceptable for Xpert Xpress SARS-CoV-2/FLU/RSV testing.  Fact Sheet for Patients: EntrepreneurPulse.com.au  Fact Sheet for Healthcare Providers: IncredibleEmployment.be  This test is not yet approved or cleared by the [place] FDA and has been authorized for detection and/or diagnosis of SARS-CoV-2 by FDA under an Emergency Use Authorization (EUA). This EUA will remain in effect (meaning this test can be used) for the duration of the COVID-19 declaration under Section 564(b)(1) of the Act, 21 U.S.C. section 360bbb-3(b)(1), unless the authorization is terminated  or revoked.  Performed at [facility], [address] [place], [place] [address]      Radiological Exams on Admission: DG Chest 2 View  Result Date: 02/20/2020 CLINICAL DATA:  Metastatic sarcoma.  Follow-up pneumothorax EXAM: CHEST - 2 VIEW COMPARISON:  02/15/2020 FINDINGS: Progression of left pneumothorax now approximately 30-40%. Small amount of pleural fluid in the left lung base. Gas in the chest wall on the left is unchanged. Multiple bilateral lung nodules most compatible with metastatic disease, unchanged. Port-A-Cath tip SVC unchanged. Small right pleural effusion. IMPRESSION: Interval progression of left pneumothorax approximately 30-40%. These results were called by telephone at the time of interpretation on 02/20/2020 at 3:53 pm to provider [facility] , who verbally acknowledged these results. Electronically Signed   By: Franchot Gallo M.D.   On: 02/20/2020 15:53   DG Chest Port 1 View  Result Date: 02/20/2020 CLINICAL DATA:  Pneumothorax follow-up EXAM: PORTABLE CHEST 1 VIEW COMPARISON:  February 20, 2020 FINDINGS: There is a persistent unchanged left-sided pneumothorax. There is subcutaneous gas along the patient's left flank. There are airspace opacities involving the left upper lung zone and right lung base. There is a well-positioned right-sided Port-A-Cath. No definite right-sided pneumothorax. There is a small right-sided pleural effusion. IMPRESSION: No significant interval change. Persistent bilateral airspace opacities in addition to a moderate-sized left-sided pneumothorax. Electronically Signed   By: Constance Holster M.D.   On: 02/20/2020 18:47    EKG: Independently reviewed.  Showed normal sinus rhythm rate of 99 with no significant ST changes  Assessment/Plan Principal Problem:   Spontaneous pneumothorax Active Problems:   Esophageal dysphagia   Liposarcoma of left lower extremity (HCC)   Dehydration   Left-sided hydropneumothorax--associated with  necrotic malignant mass     #1  Recurrent spontaneous hydro-pneumothorax: Patient will be admitted for observation.  Monitor on telemetry.  Repeat chest x-ray in the morning.  Cardiothoracic surgery to be consulted if pneumothorax gets worse.  He may require insertion of another chest tube or Pleurx catheter.  #2 malignant leiomyosarcoma: Palliation at this point.  He is going to multiple chemotherapies.  #3 esophageal dysphagia: Continue current diet.  #4 dehydration: Continue hydration   DVT prophylaxis: Heparin Code Status: Full code Family Communication: No family at bedside Disposition Plan: To be determined Consults called: Cardiovascular surgery Dr. Servando Snare Admission status: Observation  Severity of Illness:  The appropriate patient status for this patient is OBSERVATION. Observation status is judged to be reasonable and necessary in order to provide the required intensity of service to ensure the patient's safety. The patient's presenting symptoms, physical exam findings, and initial radiographic and laboratory data in the context of their medical condition is felt to place them at decreased risk for further clinical deterioration. Furthermore, it is anticipated that the patient will be medically stable for discharge from the hospital within 2 midnights of admission. The following factors support the patient status of observation.   " The patient's presenting symptoms include shortness of breath and chest pain. " The physical exam findings include decreased air entry on the left side. " The initial radiographic and laboratory data are 50% pneumothorax on the left.     Barbette Merino MD [facility] Pager [phone]  If 7PM-7AM, please contact night-coverage [email or web address] Password [facility]  02/20/2020, 10:15 PM

## 2020-02-20 NOTE — ED Provider Notes (Signed)
[facility] EMERGENCY DEPARTMENT Provider Note   CSN: [id number] Arrival date & time: 02/20/20  1705     History Chief Complaint  Patient presents with  . SOB/ pneumothorax    Leonard [name] is a 60 y.o. male.  60 year old male with prior medical history as detailed below presents for evaluation.  Patient with recent admission for spontaneous left-sided pneumothorax in the setting of metastatic liposarcoma.  Patient patient was recently discharged after multiple left-sided chest tubes placed.  Patient reports that he coughed hard around 9 AM this morning.  He felt mildly short of breath thereafter.  He presented to the outpatient clinic where an outpatient x-ray showed reaccumulating left-sided pneumothorax.  He was sent to the ED for evaluation.  Upon arrival to the ED he is without significant complaint.  He complains of feeling mild shortness of breath.  He is comfortable at rest.  He is speaking in full sentences.  The history is provided by the patient and medical records.  Illness Location:  Recurrent left-sided pneumothorax Severity:  Moderate Onset quality:  Gradual Duration:  1 day Timing:  Constant Progression:  Unchanged Chronicity:  New Associated symptoms: shortness of breath   Associated symptoms: no chest pain        Past Medical History:  Diagnosis Date  . Cancer [facility])    metastatic lung cancer  . Port-A-Cath in place 05/19/2019   right    Patient Active Problem List   Diagnosis Date Noted  . Left-sided hydropneumothorax--associated with necrotic malignant mass 02/09/2020  . Pneumothorax on left 02/07/2020  . Dehydration 08/21/2019  . Port-A-Cath in place 05/19/2019  . Goals of care, counseling/discussion 05/15/2019  . 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 Liposarcoma of left lower extremity ([place]) 04/25/2019  . Leg mass, left 08/02/2018  . Localized swelling of left lower leg 08/01/2018  . Food impaction of  esophagus 06/27/2017  . NSAID induced gastritis   . Esophageal dysphagia     Past Surgical History:  Procedure Laterality Date  . CHOLECYSTECTOMY    . ESOPHAGEAL DILATION N/A 06/27/2017   Procedure: ESOPHAGEAL DILATION;  Surgeon: Danie Binder, MD;  Location[facility];  Service: Endoscopy;  Laterality: N/A;  . ESOPHAGOGASTRODUODENOSCOPY N/A 06/27/2017   Procedure: ESOPHAGOGASTRODUODENOSCOPY (EGD);  Surgeon: Danie Binder, MD;  Location: [facility];  Service: Endoscopy;  Laterality: N/A;  . IR IMAGING GUIDED PORT INSERTION  05/04/2019  . knee sx         Family History  Problem Relation Age of Onset  . Heart disease Father   . Cancer Maternal Aunt   . Cancer Maternal Uncle   . Cancer Paternal Uncle   . Vision loss Maternal Grandfather   . Colon cancer Neg Hx   . Colon polyps Neg Hx     Social History   Tobacco Use  . Smoking status: Former Research scientist (life sciences)  . Smokeless tobacco: Never Used  Vaping Use  . Vaping Use: Never used  Substance Use Topics  . Alcohol use: Yes    Comment: 1-2 drinks per month  . Drug use: Never    Home Medications Prior to Admission medications   Medication Sig Start Date End Date Taking? Authorizing Provider  ALPRAZolam (XANAX) 0.25 MG tablet Take 1 tablet (0.25 mg total) by mouth 2 (two) times daily as needed for anxiety. Patient not taking: Reported on 02/20/2020 10/30/19   [name], MD  aluminum-magnesium hydroxide-simethicone (MAALOX) 200-200-20 MG/5ML SUSP Swish  and swallow 5 ml four times daily as needed for mouth sores. 09/21/19   [name], MD  benzonatate (TESSALON) 200 MG capsule Take 1 capsule (200 mg total) by mouth 3 (three) times daily. 02/20/20   [name], MD  escitalopram (LEXAPRO) 10 MG tablet Take 1 tablet (10 mg total) by mouth daily. 12/25/19   Jacquelin Hawking, NP  gabapentin (NEURONTIN) 300 MG capsule Take 1 capsule (300 mg total) by mouth 3 (three) times daily. 02/16/20 03/17/20  Mikhail, Velta Addison,  DO  HYDROcodone-homatropine (HYCODAN) 5-1.5 MG/5ML syrup Take 10 mLs by mouth every 6 (six) hours as needed for cough. SMARTSIG:1 Teaspoon By Mouth Every 6 Hours PRN 02/20/20   [name], MD  megestrol (MEGACE) 400 MG/10ML suspension Take 10 mLs (400 mg total) by mouth 2 (two) times daily. 01/31/20   [name], MD  Oxycodone HCl 10 MG TABS Take 0.5 tablets (5 mg total) by mouth every 8 (eight) hours as needed. Patient taking differently: Take 5 mg by mouth every 8 (eight) hours as needed (severe pain). 01/03/20   Tanner, Lyndon Code., PA-C  pantoprazole (PROTONIX) 40 MG tablet Take 1 tablet (40 mg total) by mouth 2 (two) times daily before a meal. 02/16/20 03/17/20  Mikhail, Velta Addison, DO  prochlorperazine (COMPAZINE) 10 MG tablet TAKE (1) TABLET BY MOUTH EVERY (6) HOURS AS NEEDED. Patient taking differently: Take 10 mg by mouth every 6 (six) hours as needed for nausea or vomiting. 01/02/20   Benay Pike, MD  zolpidem (AMBIEN) 10 MG tablet Take 1 tablet (10 mg total) by mouth at bedtime as needed for sleep. 01/31/20 03/01/20  [name], MD    Allergies    Patient has no known allergies.  Review of Systems   Review of Systems  Respiratory: Positive for shortness of breath.   Cardiovascular: Negative for chest pain.  All other systems reviewed and are negative.   Physical Exam Updated Vital Signs BP 129/88   Pulse 89   Temp 98.9 F (37.2 C)   Resp (!) 22   Ht 5\' 10"  (1.778 m)   Wt 93 kg   SpO2 97%   BMI 29.41 kg/m   Physical Exam Vitals and nursing note reviewed.  Constitutional:      General: He is not in acute distress.    Appearance: He is well-developed and well-nourished.  HENT:     Head: Normocephalic and atraumatic.     Mouth/Throat:     Mouth: Oropharynx is clear and moist.  Eyes:     Extraocular Movements: EOM normal.     Conjunctiva/sclera: Conjunctivae normal.     Pupils: Pupils are equal, round, and reactive to light.  Cardiovascular:      Rate and Rhythm: Normal rate and regular rhythm.     Heart sounds: Normal heart sounds.  Pulmonary:     Effort: Pulmonary effort is normal. No respiratory distress.     Comments: Recent breath sounds at left base Abdominal:     General: There is no distension.     Palpations: Abdomen is soft.     Tenderness: There is no abdominal tenderness.  Musculoskeletal:        General: No deformity or edema. Normal range of motion.     Cervical back: Normal range of motion and neck supple.  Skin:    General: Skin is warm and dry.  Neurological:     Mental Status: He is alert and oriented to person, place, and time.  Psychiatric:  Mood and Affect: Mood and affect normal.     ED Results / Procedures / Treatments   Labs (all labs ordered are listed, but only abnormal results are displayed) Labs Reviewed  COMPREHENSIVE METABOLIC PANEL - Abnormal; Notable for the following components:      Result Value   CO2 21 (*)    Albumin 3.4 (*)    AST 43 (*)    Alkaline Phosphatase 133 (*)    All other components within normal limits  CBC WITH DIFFERENTIAL/PLATELET - Abnormal; Notable for the following components:   RDW 17.2 (*)    Platelets 415 (*)    All other components within normal limits  RESP PANEL BY RT-PCR (FLU A&B, COVID) ARPGX2  PROTIME-INR    EKG None  Radiology DG Chest 2 View  Result Date: 02/20/2020 CLINICAL DATA:  Metastatic sarcoma.  Follow-up pneumothorax EXAM: CHEST - 2 VIEW COMPARISON:  02/15/2020 FINDINGS: Progression of left pneumothorax now approximately 30-40%. Small amount of pleural fluid in the left lung base. Gas in the chest wall on the left is unchanged. Multiple bilateral lung nodules most compatible with metastatic disease, unchanged. Port-A-Cath tip SVC unchanged. Small right pleural effusion. IMPRESSION: Interval progression of left pneumothorax approximately 30-40%. These results were called by telephone at the time of interpretation on 02/20/2020 at 3:53 pm  to provider [facility] , who verbally acknowledged these results. Electronically Signed   By: Franchot Gallo M.D.   On: 02/20/2020 15:53   DG Chest Port 1 View  Result Date: 02/20/2020 CLINICAL DATA:  Pneumothorax follow-up EXAM: PORTABLE CHEST 1 VIEW COMPARISON:  February 20, 2020 FINDINGS: There is a persistent unchanged left-sided pneumothorax. There is subcutaneous gas along the patient's left flank. There are airspace opacities involving the left upper lung zone and right lung base. There is a well-positioned right-sided Port-A-Cath. No definite right-sided pneumothorax. There is a small right-sided pleural effusion. IMPRESSION: No significant interval change. Persistent bilateral airspace opacities in addition to a moderate-sized left-sided pneumothorax. Electronically Signed   By: Constance Holster M.D.   On: 02/20/2020 18:47    Procedures Procedures   Medications Ordered in ED Medications  lidocaine-EPINEPHrine (XYLOCAINE W/EPI) 1 %-1:100000 (with pres) injection 10 mL (10 mLs Infiltration Given 02/20/20 1840)    ED Course  I have reviewed the triage vital signs and the nursing notes.  Pertinent labs & imaging results that were available during my care of the patient were reviewed by me and considered in my medical decision making (see chart for details).    MDM Rules/Calculators/A&P                          MDM  Screen complete  Jef Futch [place] was evaluated in Emergency Department on 02/20/2020 for the symptoms described in the history of present illness. He was evaluated in the context of the global COVID-19 pandemic, which necessitated consideration that the patient might be at risk for infection with the SARS-CoV-2 virus that causes COVID-19. Institutional protocols and algorithms that pertain to the evaluation of patients at risk for COVID-19 are in a state of rapid change based on information released by regulatory bodies including the CDC and federal and state  organizations. These policies and algorithms were followed during the patient's care in the ED.   Patient is presenting with recurrent left-sided pneumothorax.  Patient is status post recent admission and treatment of spontaneous left-sided pneumothorax.  Patient with 2 chest tubes placed last week.  Initial chest tube with pigtail.  Patient then ended up with a larger chest tube after failure of pneumothorax resolution with pigtail alone.   Patient now has recurrent left-sided pneumothorax.  Suspect that pneumothorax found today may be recurrent after patient reported coughing early this morning.  Case and films reviewed with Dr. Servando Snare of CT surgery.  Per Dr. Servando Snare, patient does not require emergent placement of new chest tube if patient is stable and with minimal symptoms.  Patient should be admitted for overnight observation and repeat chest x-ray in the a.m.  Patient is not a CT surgical candidate per Dr. Servando Snare.    Plan of care extensively discussed with patient.  Patient was offered placement of pigtail tonight.  However, patient is not particular excited about placement of repeat pigtail catheter given his lack of significant symptoms.  Patient is agreeable with plan for overnight observation.  Per Dr. Servando Snare, patient may be candidate for PleurX catheter placement -- If patient's pneumothorax appears to be worsening over the next 12 to 24 hours.  Medicine service Jonelle Sidle) is aware case and will evaluate for admission.    Final Clinical Impression(s) / ED Diagnoses Final diagnoses:  Pneumothorax, unspecified type    Rx / DC Orders ED Discharge Orders    None       Valarie Merino, MD 02/20/20 [phone]

## 2020-02-21 ENCOUNTER — Ambulatory Visit: Payer: 59 | Admitting: Surgery

## 2020-02-21 ENCOUNTER — Ambulatory Visit (HOSPITAL_COMMUNITY): Payer: 59 | Admitting: Hematology

## 2020-02-21 ENCOUNTER — Inpatient Hospital Stay (HOSPITAL_COMMUNITY): Payer: 59

## 2020-02-21 ENCOUNTER — Other Ambulatory Visit (HOSPITAL_COMMUNITY): Payer: Self-pay

## 2020-02-21 DIAGNOSIS — R1319 Other dysphagia: Secondary | ICD-10-CM | POA: Diagnosis not present

## 2020-02-21 DIAGNOSIS — R059 Cough, unspecified: Secondary | ICD-10-CM

## 2020-02-21 DIAGNOSIS — E860 Dehydration: Secondary | ICD-10-CM | POA: Diagnosis not present

## 2020-02-21 DIAGNOSIS — J9383 Other pneumothorax: Secondary | ICD-10-CM

## 2020-02-21 DIAGNOSIS — J948 Other specified pleural conditions: Secondary | ICD-10-CM

## 2020-02-21 DIAGNOSIS — R918 Other nonspecific abnormal finding of lung field: Secondary | ICD-10-CM

## 2020-02-21 LAB — CBC
HCT: 37.7 % — ABNORMAL LOW (ref 39.0–52.0)
Hemoglobin: 12.5 g/dL — ABNORMAL LOW (ref 13.0–17.0)
MCH: 32.4 pg (ref 26.0–34.0)
MCHC: 33.2 g/dL (ref 30.0–36.0)
MCV: 97.7 fL (ref 80.0–100.0)
Platelets: 365 10*3/uL (ref 150–400)
RBC: 3.86 MIL/uL — ABNORMAL LOW (ref 4.22–5.81)
RDW: 17.2 % — ABNORMAL HIGH (ref 11.5–15.5)
WBC: 7.6 10*3/uL (ref 4.0–10.5)
nRBC: 0 % (ref 0.0–0.2)

## 2020-02-21 LAB — COMPREHENSIVE METABOLIC PANEL
ALT: 28 U/L (ref 0–44)
AST: 40 U/L (ref 15–41)
Albumin: 2.9 g/dL — ABNORMAL LOW (ref 3.5–5.0)
Alkaline Phosphatase: 118 U/L (ref 38–126)
Anion gap: 9 (ref 5–15)
BUN: 9 mg/dL (ref 6–20)
CO2: 23 mmol/L (ref 22–32)
Calcium: 8.7 mg/dL — ABNORMAL LOW (ref 8.9–10.3)
Chloride: 107 mmol/L (ref 98–111)
Creatinine, Ser: 0.69 mg/dL (ref 0.61–1.24)
GFR, Estimated: >60 mL/min (ref >60)
Glucose, Bld: 84 mg/dL (ref 70–99)
Potassium: 3.7 mmol/L (ref 3.5–5.1)
Sodium: 139 mmol/L (ref 135–145)
Total Bilirubin: 0.9 mg/dL (ref 0.3–1.2)
Total Protein: 6.1 g/dL — ABNORMAL LOW (ref 6.5–8.1)

## 2020-02-21 MED ORDER — FENTANYL CITRATE (PF) 100 MCG/2ML IJ SOLN
25.0000 ug | Freq: Once | INTRAMUSCULAR | Status: AC
  Administered 2020-02-21: 25 ug via INTRAVENOUS
  Filled 2020-02-21: qty 2

## 2020-02-21 MED ORDER — BENZONATATE 100 MG PO CAPS
100.0000 mg | ORAL_CAPSULE | Freq: Three times a day (TID) | ORAL | Status: DC | PRN
  Administered 2020-02-22 (×2): 100 mg via ORAL
  Filled 2020-02-21 (×2): qty 1

## 2020-02-21 MED ORDER — CHLORHEXIDINE GLUCONATE CLOTH 2 % EX PADS
6.0000 | MEDICATED_PAD | Freq: Every day | CUTANEOUS | Status: DC
  Administered 2020-02-22 – 2020-02-29 (×7): 6 via TOPICAL

## 2020-02-21 MED ORDER — SODIUM CHLORIDE 0.9% FLUSH
10.0000 mL | Freq: Three times a day (TID) | INTRAVENOUS | Status: DC
  Administered 2020-02-21 – 2020-02-27 (×14): 10 mL

## 2020-02-21 MED ORDER — BENZONATATE 100 MG PO CAPS: 200.0000 mg | ORAL_CAPSULE | Freq: Three times a day (TID) | ORAL | Status: DC

## 2020-02-21 MED ORDER — HYDROMORPHONE HCL 1 MG/ML IJ SOLN
1.0000 mg | INTRAMUSCULAR | Status: DC | PRN
  Administered 2020-02-21 – 2020-02-24 (×10): 1 mg via INTRAVENOUS
  Filled 2020-02-21 (×10): qty 1

## 2020-02-21 MED ORDER — LIDOCAINE HCL (PF) 1 % IJ SOLN: 5.0000 mL | Freq: Once | INTRAMUSCULAR | Status: AC

## 2020-02-21 MED ORDER — HYDROCODONE-HOMATROPINE 5-1.5 MG/5ML PO SYRP: 10.0000 mL | ORAL_SOLUTION | Freq: Once | ORAL | Status: DC | PRN

## 2020-02-21 MED ORDER — LIDOCAINE HCL (PF) 1 % IJ SOLN
INTRAMUSCULAR | Status: AC
  Administered 2020-02-21: 5 mL
  Filled 2020-02-21: qty 30

## 2020-02-21 NOTE — ED Notes (Addendum)
Chest tube: Signed consent questions, v/s,1/10 pain, supplemental oxygen 2lpm, site marked, sterile procedure, provider requested fentanyl to be given post procedure, however, this is a painful procedure (pt at 10/10) and we agreed to give 50 mcg of fentanyl, procedure ongoing, tube in place and being sutured. Pain 5/10. Procedure complete. Stat cxr ordered. Site and dressing, clean, dry and intact.

## 2020-02-21 NOTE — Consult Note (Signed)
NAME:  Leonard [name], MRN:  [id number], DOB:  08/28/1960, LOS: 1 ADMISSION DATE:  02/20/2020, CONSULTATION DATE:  02/21/20 REFERRING MD:  Tana Coast, CHIEF COMPLAINT:  Shortness of breath   Brief History:  60 y.o. M with PMH of metastatic Liposarcoma s/p 7 cycles chemotherapy now on palliation therapy with recurrent LLL hydropneumothorax.      History of Present Illness:  60 y.o. M with PMH of metastatic Liposarcoma s/p 7 cycles chemotherapy now on palliation therapy with recurrent LLL hydropneumothorax.   Hospitalized 1/26-2/4 requiring pigtail chest tube and then large bore chest tube.  Pt reports he was feeling fairly well after discharge, coughed hard the morning of 2/8 and felt worsening shortness of breath and had some drainage from the site of prior chest tube.  He saw his oncologist who did outpatient CXR showing re-accumulation of pneumothorax, so was sent to the ED.   He was been stable on room air.  CT surgery consulted by ED and did not think that pt was a surgical candidate and recommended admission for observation.    CXR on 2/9 with stable pneumothorax without any tension component, pt does feel slightly worse this morning, but remains on room air.   PCCM consulted for possible repeat chest tube vs. Pleurx.   Past Medical History:   has a past medical history of Cancer ([place]) and Port-A-Cath in place (05/19/2019).  Metastatic Liposarcoma with recurrent necrotic L-sided hydro-pneumothorax s/p multiple chest tubes, L kidney Lesion   Significant Hospital Events:  2/8 admit to hospitalists 2/9 PCCM consult  Consults:  PCCM  Procedures:    Significant Diagnostic Tests:  2/9 CXR>>Stable left-sided pneumothorax that tension component. Multiple mass lesions throughout the lungs again noted, largest left upper lobe consistent with known neoplasm. Atelectasis right mid lung. No new opacity evident compared to 1 day prior. Stable cardiac silhouette.   Micro Data:  2/8 Covid.19,  flu>>negative   Antimicrobials:    Interim History / Subjective:   Remains on room air, cough slightly worse  Objective   Blood pressure 126/83, pulse 86, temperature 98.9 F (37.2 C), resp. rate 13, height 5\' 10"  (1.778 m), weight 93 kg, SpO2 95 %.       No intake or output data in the 24 hours ending 02/21/20 0837 Filed Weights   02/20/20 1735  Weight: 93 kg   General:  Well-nourished M, fatigued-appearing, no distress HEENT: MM pink/moist,  Neuro: awake and alert, oriented conversational CV: s1s2 rrr, no m/r/g PULM:  Slight expiratory wheeze throughout, decreased air movement in LLL with no dried serosanguinous drainage at the site of prior chest tube, no respiratory distress GI: soft, bsx4 active  Extremities: warm/dry, no edema  Skin: no rashes or lesions   Resolved Hospital Problem list     Assessment & Plan:     Left lower extremity Liposarcoma with metastatic disease to the lung, LLL necrotic mass with recurrent hydropneumothorax Diagnosed 2020, follows with [facility].  S/p 7 cycles gemcitabine and docetaxel with progression.  Oncology felt that further chemotherapy would not be helpful.   Plan to have Guardant 360 mutation testing at [facility] or [facility].   Also mention of pleurodesis with Dr. Cyndia Bent for recurrent pneumothorax.   Recent admission 1/26-2/4 requiring pigtail and then large bore chest tube.  P: -Pt not a surgical candidate per TCTS overnight, discussion with patient whether he would want repeat chest tubes placed, pt states he is open to whatever needs to be done to deal  with the pneumothorax -Dr. Elsworth Soho to discuss with TCTS, PCCM will likely place repeat pigtail catheter today. -Rest of care plan per primary team    Goals of Care:  Last date of multidisciplinary goals of care discussion:per primary Family and staff present:  Summary of discussion:  Follow up goals of care discussion due:  Code Status: Full code, pt states he wants to  pursue further intervention as needed, continue aggressive care  Labs   CBC: Recent Labs  Lab 02/20/20 1811 02/21/20 0010  WBC 8.5 7.6  NEUTROABS 6.3  --   HGB 14.1 12.5*  HCT 43.3 37.7*  MCV 98.2 97.7  PLT 415* 834    Basic Metabolic Panel: Recent Labs  Lab 02/20/20 1811 02/21/20 0010  NA 137 139  K 3.8 3.7  CL 106 107  CO2 21* 23  GLUCOSE 99 84  BUN 9 9  CREATININE 0.75 0.69  CALCIUM 9.2 8.7*   GFR: Estimated Creatinine Clearance: 112.5 mL/min (by C-G formula based on SCr of 0.69 mg/dL). Recent Labs  Lab 02/20/20 1811 02/21/20 0010  WBC 8.5 7.6    Liver Function Tests: Recent Labs  Lab 02/20/20 1811 02/21/20 0010  AST 43* 40  ALT 32 28  ALKPHOS 133* 118  BILITOT 0.8 0.9  PROT 7.0 6.1*  ALBUMIN 3.4* 2.9*   No results for input(s): LIPASE, AMYLASE in the last 168 hours. No results for input(s): AMMONIA in the last 168 hours.  ABG No results found for: PHART, PCO2ART, PO2ART, HCO3, TCO2, ACIDBASEDEF, O2SAT   Coagulation Profile: Recent Labs  Lab 02/20/20 1811  INR 1.1    Cardiac Enzymes: No results for input(s): CKTOTAL, CKMB, CKMBINDEX, TROPONINI in the last 168 hours.  HbA1C: Hgb A1c MFr Bld  Date/Time Value Ref Range Status  08/31/2019 10:17 AM 5.3 4.8 - 5.6 % Final    Comment:    (NOTE) Pre diabetes:          5.7%-6.4%  Diabetes:              >6.4%  Glycemic control for   <7.0% adults with diabetes   08/01/2018 12:11 PM 5.5 4.8 - 5.6 % Final    Comment:    (NOTE) Pre diabetes:          5.7%-6.4% Diabetes:              >6.4% Glycemic control for   <7.0% adults with diabetes     CBG: No results for input(s): GLUCAP in the last 168 hours.  Review of Systems:   Negative except as noted in HPI.   Past Medical History:  He,  has a past medical history of Cancer ([place]) and Port-A-Cath in place (05/19/2019).   Surgical History:   Past Surgical History:  Procedure Laterality Date  . CHOLECYSTECTOMY    . ESOPHAGEAL  DILATION N/A 06/27/2017   Procedure: ESOPHAGEAL DILATION;  Surgeon: Danie Binder, MD;  [facility];  Service: Endoscopy;  Laterality: N/A;  . ESOPHAGOGASTRODUODENOSCOPY N/A 06/27/2017   Procedure: ESOPHAGOGASTRODUODENOSCOPY (EGD);  Surgeon: Danie Binder, MD;  [facility];  Service: Endoscopy;  Laterality: N/A;  . IR IMAGING GUIDED PORT INSERTION  05/04/2019  . knee sx       Social History:   reports that he has quit smoking. He has never used smokeless tobacco. He reports current alcohol use. He reports that he does not use drugs.   Family History:  His family history includes Cancer in his maternal  aunt, maternal uncle, and paternal uncle; Heart disease in his father; Vision loss in his maternal grandfather. There is no history of Colon cancer or Colon polyps.   Allergies No Known Allergies   Home Medications  Prior to Admission medications   Medication Sig Start Date End Date Taking? Authorizing Provider  ALPRAZolam (XANAX) 0.25 MG tablet Take 1 tablet (0.25 mg total) by mouth 2 (two) times daily as needed for anxiety. Patient taking differently: Take 0.25 mg by mouth See admin instructions. Take 0.25 mg by mouth at bedtime and an additional 0.25 mg once a day as needed for anxiety 10/30/19  Yes [name], MD  benzonatate (TESSALON) 200 MG capsule Take 1 capsule (200 mg total) by mouth 3 (three) times daily. 02/20/20  Yes [name], MD  escitalopram (LEXAPRO) 10 MG tablet Take 1 tablet (10 mg total) by mouth daily. Patient taking differently: Take 10 mg by mouth at bedtime. 12/25/19  Yes Jacquelin Hawking, NP  gabapentin (NEURONTIN) 300 MG capsule Take 1 capsule (300 mg total) by mouth 3 (three) times daily. Patient taking differently: Take 300 mg by mouth in the morning and at bedtime. 02/16/20 03/17/20 Yes Mikhail, Velta Addison, DO  HYDROcodone-homatropine (HYCODAN) 5-1.5 MG/5ML syrup Take 10 mLs by mouth every 6 (six) hours as needed for cough.  SMARTSIG:1 Teaspoon By Mouth Every 6 Hours PRN Patient taking differently: Take 10 mLs by mouth every 6 (six) hours as needed for cough. 02/20/20  Yes [name], MD  Oxycodone HCl 10 MG TABS Take 0.5 tablets (5 mg total) by mouth every 8 (eight) hours as needed. Patient taking differently: Take 5 mg by mouth every 8 (eight) hours as needed (for moderate to severe pain). 01/03/20  Yes Tanner, Lyndon Code., PA-C  pantoprazole (PROTONIX) 40 MG tablet Take 1 tablet (40 mg total) by mouth 2 (two) times daily before a meal. Patient taking differently: Take 40 mg by mouth 2 (two) times daily as needed (for reflux). 02/16/20 03/17/20 Yes Mikhail, Velta Addison, DO  prochlorperazine (COMPAZINE) 10 MG tablet TAKE (1) TABLET BY MOUTH EVERY (6) HOURS AS NEEDED. Patient taking differently: Take 10 mg by mouth every 6 (six) hours as needed for nausea or vomiting. 01/02/20  Yes Iruku, Arletha Pili, MD  zolpidem (AMBIEN) 10 MG tablet Take 1 tablet (10 mg total) by mouth at bedtime as needed for sleep. Patient taking differently: Take 10 mg by mouth at bedtime. 01/31/20 03/01/20 Yes [name], MD  aluminum-magnesium hydroxide-simethicone (MAALOX) 200-200-20 MG/5ML SUSP Swish and swallow 5 ml four times daily as needed for mouth sores. Patient not taking: No sig reported 09/21/19   [name], MD  megestrol (MEGACE) 400 MG/10ML suspension Take 10 mLs (400 mg total) by mouth 2 (two) times daily. Patient not taking: No sig reported 01/31/20   [name], MD     Critical care time: 45 minutes    Otilio Carpen Victoriya Pol, PA-C [facility] See Amion for pager If no response to pager , please call [phone] until 7pm After 7:00 pm call Elink  [phone]?[place]

## 2020-02-21 NOTE — Plan of Care (Signed)
  Problem: Education: Goal: Knowledge of General Education information will improve Description: Including pain rating scale, medication(s)/side effects and non-pharmacologic comfort measures Outcome: Progressing   Problem: Health Behavior/Discharge Planning: Goal: Ability to manage health-related needs will improve Outcome: Progressing   Problem: Clinical Measurements: Goal: Respiratory complications will improve Outcome: Progressing   Problem: Clinical Measurements: Goal: Cardiovascular complication will be avoided Outcome: Progressing   Problem: Nutrition: Goal: Adequate nutrition will be maintained Outcome: Progressing

## 2020-02-21 NOTE — Procedures (Signed)
Insertion of Chest Tube Procedure Note  Leonard [name]  [id number]  11-24-1960  Date:02/21/20  Time:10:53 AM    Provider Performing: Otilio Carpen [name]   Procedure: Chest Tube Insertion (46219)  Indication(s) Pneumothorax  Consent Risks of the procedure as well as the alternatives and risks of each were explained to the patient and/or caregiver.  Consent for the procedure was obtained and is signed in the bedside chart  Anesthesia Topical only with 1% lidocaine    Time Out Verified patient identification, verified procedure, site/side was marked, verified correct patient position, special equipment/implants available, medications/allergies/relevant history reviewed, required imaging and test results available.   Sterile Technique Maximal sterile technique including full sterile barrier drape, hand hygiene, sterile gown, sterile gloves, mask, hair covering, sterile ultrasound probe cover (if used).   Procedure Description Ultrasound not used to identify appropriate pleural anatomy for placement and overlying skin marked. Area of placement cleaned and draped in sterile fashion.  A 14 French pigtail pleural catheter was placed into the left pleural space using Seldinger technique. Appropriate return of air was obtained.  The tube was connected to atrium and placed on -20 cm H2O wall suction.   Complications/Tolerance None; patient tolerated the procedure well. Chest X-ray is ordered to verify placement.   EBL Minimal  Specimen(s) none  Otilio Carpen Trude Cansler, PA-C

## 2020-02-21 NOTE — Progress Notes (Signed)
[facility]                                                                              Patient Demographics  Leonard [name], is a 60 y.o. male, DOB - Dec 28, 1960, RKY:[id number]  Admit date - 02/20/2020   Admitting Physician Elwyn Reach, MD  Outpatient Primary MD for the patient is Celene Squibb, MD  Outpatient specialists:   LOS - 1  days   Medical records reviewed and are as summarized below:    Chief Complaint  Patient presents with  . 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  At that time he had more than 50% left-sided pneumothorax that was spontaneous.  Patient apparently had been doing fine until the morning of admission, when he had a coughing spell, started feeling more short of breath and pain on the left side of the chest.  Patient went to see his oncologist, x-ray showed reaccumulation of pneumothorax.  Patient had hydropneumothorax associated with the necrotic malignant mass. TCTS was consulted by EDP, recommended admission for observation. Chest x-ray showed persistent bilateral airspace opacity in addition to a moderate-sized left sided hydropneumothorax.  Assessment & Plan    Principal Problem:   Spontaneous hydropneumothorax, recurrent in the setting of metastatic disease to the lung, LLL malignant necrotic mass -Currently stable, sats 97% on room air 97% on room air.  Recent admission 1/26-2/4 required pigtail catheter and large bore chest tube. -EDP consulted to [facility], Dr. 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  Oncology felt further chemotherapy would not be helpful due to progression of the disease. -high risk of readmissions, recurrent hydropneumothorax, metastatic malignancy.  Palliative care was consulted during previous admission, not seen on 2/1, recommended outpatient palliative to follow    Esophageal dysphagia -Continue current diet     Dehydration -Placed on IV fluid hydration  Anxiety, depression -Continue Lexapro, Xanax   Code Status: Full CODE STATUS DVT Prophylaxis:  heparin injection 5,000 Units Start: 02/20/20 2345   Level of Care: Level of care: Telemetry Surgical Family Communication: Discussed all imaging results, lab results, explained to the patient   Disposition Plan:     Status is: Inpatient  Remains inpatient appropriate because:Inpatient level of care appropriate due to severity of illness   Dispo: The patient is from: Home              Anticipated d/c is to: Home              Anticipated d/c date is: > 3 days              Patient currently is not  medically stable to d/c.   Difficult to place patient No      Time Spent in minutes   35 minutes  Procedures:  None  Consultants:   TCTS PCCM  Antimicrobials:   Anti-infectives (From admission, onward)   None          Medications  Scheduled Meds: . ALPRAZolam  0.25 mg Oral QHS  . benzonatate  200 mg Oral TID  . escitalopram  10 mg Oral QHS  . gabapentin  300 mg Oral TID  . heparin  5,000 Units Subcutaneous Q8H  . lidocaine (PF)  5 mL Other Once  . lidocaine (PF)      . lidocaine-EPINEPHrine  10 mL Infiltration Once  . zolpidem  10 mg Oral QHS   Continuous Infusions: . sodium chloride 75 mL/hr at 02/21/20 0015   PRN Meds:.ALPRAZolam, fentaNYL (SUBLIMAZE) injection, HYDROcodone-homatropine, ondansetron **OR** ondansetron (ZOFRAN) IV, oxyCODONE, pantoprazole,  prochlorperazine      Subjective:   Leonard [name] was seen and examined today.  No complaints by the patient, no worsening of shortness of breath, chest pain.  O2 sats stable, off O2.  Pain controlled. Patient denies  abdominal pain, N/V/D/C, new weakness, numbess, tingling.  + Wheezing  Objective:   Vitals:   02/21/20 0645 02/21/20 1015 02/21/20 1016 02/21/20 1018  BP: 126/83 130/78    Pulse: 86 85    Resp: 13  19   Temp:      SpO2: 95% 97%  99%  Weight:      Height:       No intake or output data in the 24 hours ending 02/21/20 1049   Wt Readings from Last 3 Encounters:  02/20/20 93 kg  02/20/20 93 kg  02/16/20 94.4 kg     Exam  General: Alert and oriented x 3, NAD  Cardiovascular: S1 S2 auscultated, no murmurs, RRR  Respiratory: Expiratory wheezing throughout, decreased breath sounds in LLL  Gastrointestinal: Soft, nontender, nondistended, + bowel sounds  Ext: no pedal edema bilaterally  Neuro: no new deficits  Musculoskeletal: No digital cyanosis, clubbing  Skin: No rashes  Psych: Normal affect and demeanor, alert and oriented x3    Data Reviewed:  I have personally reviewed following labs and imaging studies  Micro Results Recent Results (from the past 240 hour(s))  Resp Panel by RT-PCR (Flu A&B, Covid) Nasopharyngeal Swab     Status: None   Collection Time: 02/20/20  6:30 PM   Specimen: Nasopharyngeal Swab; Nasopharyngeal(NP) swabs in vial transport medium  Result Value Ref Range Status   SARS Coronavirus 2 by RT PCR NEGATIVE NEGATIVE Final    Comment: (NOTE) SARS-CoV-2 target nucleic acids are NOT DETECTED.  The SARS-CoV-2 RNA is generally detectable in upper respiratory specimens during the acute phase of infection. The lowest concentration of SARS-CoV-2 viral copies this assay can detect is 138 copies/mL. A negative result does not preclude SARS-Cov-2 infection and should not be used as the sole basis for treatment or other patient  management decisions. A negative result may occur with  improper specimen collection/handling, submission of specimen other than nasopharyngeal swab, presence of viral mutation(s) within the areas targeted by this assay, and inadequate number of viral copies(<138 copies/mL). A negative result must be combined with clinical observations, patient history, and epidemiological information. The expected result is Negative.  Fact Sheet for Patients:  EntrepreneurPulse.com.au  Fact Sheet for Healthcare Providers:  IncredibleEmployment.be  This test is no t yet approved or cleared by the [place] FDA  and  has been authorized for detection and/or diagnosis of SARS-CoV-2 by FDA under an Emergency Use Authorization (EUA). This EUA will remain  in effect (meaning this test can be used) for the duration of the COVID-19 declaration under Section 564(b)(1) of the Act, 21 U.S.C.section 360bbb-3(b)(1), unless the authorization is terminated  or revoked sooner.       Influenza A by PCR NEGATIVE NEGATIVE Final   Influenza B by PCR NEGATIVE NEGATIVE Final    Comment: (NOTE) The Xpert Xpress SARS-CoV-2/FLU/RSV plus assay is intended as an aid in the diagnosis of influenza from Nasopharyngeal swab specimens and should not be used as a sole basis for treatment. Nasal washings and aspirates are unacceptable for Xpert Xpress SARS-CoV-2/FLU/RSV testing.  Fact Sheet for Patients: EntrepreneurPulse.com.au  Fact Sheet for Healthcare Providers: IncredibleEmployment.be  This test is not yet approved or cleared by the [place] FDA and has been authorized for detection and/or diagnosis of SARS-CoV-2 by FDA under an Emergency Use Authorization (EUA). This EUA will remain in effect (meaning this test can be used) for the duration of the COVID-19 declaration under Section 564(b)(1) of the Act, 21 U.S.C. section 360bbb-3(b)(1),  unless the authorization is terminated or revoked.  Performed at [facility], [place] [address], [place], [place] [address]     Radiology Reports DG Chest 2 View  Result Date: 02/20/2020 CLINICAL DATA:  Metastatic sarcoma.  Follow-up pneumothorax EXAM: CHEST - 2 VIEW COMPARISON:  02/15/2020 FINDINGS: Progression of left pneumothorax now approximately 30-40%. Small amount of pleural fluid in the left lung base. Gas in the chest wall on the left is unchanged. Multiple bilateral lung nodules most compatible with metastatic disease, unchanged. Port-A-Cath tip SVC unchanged. Small right pleural effusion. IMPRESSION: Interval progression of left pneumothorax approximately 30-40%. These results were called by telephone at the time of interpretation on 02/20/2020 at 3:53 pm to provider [facility] , who verbally acknowledged these results. Electronically Signed   By: Franchot Gallo M.D.   On: 02/20/2020 15:53   DG Chest 2 View  Result Date: 02/15/2020 CLINICAL DATA:  Pneumothorax, recent chest tube removal, history of lung cancer EXAM: CHEST - 2 VIEW COMPARISON:  Radiograph 02/14/2020, CT 02/06/2020 FINDINGS: Accessed right IJ approach Port-A-Cath tip terminates in the lower SVC. Telemetry leads and support devices overlie the chest. Cholecystectomy clips in the right upper quadrant. Persistent subcutaneous emphysema extending across the left chest wall. Suspect small residual left apical visceral pleural line with additional increasing lucency along the left heart and mediastinal borders which could reflect a pneumomediastinum or less likely medial pneumothorax. Small bilateral effusions. Persistent heterogeneous opacities are again seen in the mid to lower lungs with additional bilateral pulmonary masses, largest in the left lung apex. No acute or worrisome osseous abnormality. IMPRESSION: 1. Suspect small residual left apical visceral pleural line/apical pneumothorax. Additional increasing lucency  along the left heart and mediastinal borders which could reflect a pneumomediastinum or less likely medial pneumothorax component. Small bilateral effusions. 2. Extensive subcutaneous emphysema remains of the left chest wall. 3. Persistent heterogeneous opacities in the mid to lower lungs, favor edema and atelectasis. 4. Redemonstrated pulmonary masses, largest in the left apex. Electronically Signed   By: Lovena Le M.D.   On: 02/15/2020 06:46   DG Chest 2 View  Result Date: 02/14/2020 CLINICAL DATA:  Pneumothorax EXAM: CHEST - 2 VIEW COMPARISON:  Chest x-ray 02/13/2020 FINDINGS: Right chest wall Port-A-Cath and left chest tube in stable appropriate position. The heart size and  mediastinal contours are within normal limits. Persistent small apical left pneumothorax. Persistent bilateral pulmonary masses again noted. Possible trace right pleural effusion. No definite left pleural effusion. No acute osseous abnormality. IMPRESSION: 1. Grossly stable small left apical pneumothorax. 2. Trace right pleural effusion. 3. Persistent bilateral pulmonary masses. 4. Lines and tubes in stable position. Electronically Signed   By: Iven Finn M.D.   On: 02/14/2020 06:54   DG Chest 2 View  Result Date: 02/13/2020 CLINICAL DATA:  Chest tube. EXAM: CHEST - 2 VIEW COMPARISON:  02/12/2020. FINDINGS: Left chest tube in stable position. Left pneumothorax improved from prior exam with tiny residual left apical pneumothorax. Persistent bilateral pulmonary masses again noted. Small right pleural effusion. Heart size stable. Degenerative change thoracic spine. IMPRESSION: 1. Left chest tube in stable position. Left pneumothorax improved from prior exam with tiny residual left apical pneumothorax. 2. Persistent bilateral pulmonary masses again noted. Small right pleural effusion. Electronically Signed   By: Marcello Moores  Register   On: 02/13/2020 07:00   CT CHEST ABDOMEN PELVIS W CONTRAST  Result Date: 02/06/2020 CLINICAL DATA:   60 year old male with history of liposarcoma in the left lower extremity. EXAM: CT CHEST, ABDOMEN, AND PELVIS WITH CONTRAST TECHNIQUE: Multidetector CT imaging of the chest, abdomen and pelvis was performed following the standard protocol during bolus administration of intravenous contrast. CONTRAST:  143mL OMNIPAQUE IOHEXOL 300 MG/ML  SOLN COMPARISON:  CT the chest, abdomen and pelvis 11/01/2019. FINDINGS: CT CHEST FINDINGS Cardiovascular: Heart size is normal. Trace amount of pericardial fluid and/or thickening, unlikely to be of hemodynamic significance at this time. No associated pericardial calcification. There is aortic atherosclerosis, as well as atherosclerosis of the great vessels of the mediastinum and the coronary arteries, including calcified atherosclerotic plaque in the left main, left anterior descending and right coronary arteries.Right internal jugular single-lumen porta cath with tip terminating at the superior cavoatrial junction. Mediastinum/Nodes: No pathologically enlarged mediastinal or hilar lymph nodes. Esophagus is unremarkable in appearance. No axillary lymphadenopathy. Lungs/Pleura: Multiple large pulmonary nodules and masses are again noted in the lungs bilaterally, generally increased in size compared to the prior examination. The largest of these is in the left upper lobe near the apex (axial image 17 of series 2) measuring 5.5 x 5.6 cm, significantly increased from 2.1 x 1.8 cm on the prior examination. This lesion is centrally low-attenuation, suggesting internal areas of necrosis. Small bilateral pleural effusions. New large left pneumothorax with considerable atelectasis in the left lung. Musculoskeletal: There are no aggressive appearing lytic or blastic lesions noted in the visualized portions of the skeleton. CT ABDOMEN PELVIS FINDINGS Hepatobiliary: No suspicious cystic or solid hepatic lesions. No intra or extrahepatic biliary ductal dilatation. Status post cholecystectomy.  Pancreas: No pancreatic mass. No pancreatic ductal dilatation. No pancreatic or peripancreatic fluid collections or inflammatory changes. Spleen: Unremarkable. Adrenals/Urinary Tract: In the upper pole the left kidney there is enhancing lesion measuring 1.7 cm in diameter, increased in size compared to the prior study (previously 1.2 cm), concerning for neoplasm. Exophytic 1.2 cm low-attenuation lesion in the lower pole the left kidney, compatible with a small simple cyst. Right kidney and bilateral adrenal glands are normal in appearance. No hydroureteronephrosis. Urinary bladder is normal in appearance. Stomach/Bowel: The appearance of the stomach is normal. There is no pathologic dilatation of small bowel or colon. Normal appendix. Vascular/Lymphatic: Aortic atherosclerosis, without evidence of aneurysm or dissection in the abdominal or pelvic vasculature. No lymphadenopathy noted in the abdomen or pelvis. Reproductive: Prostate gland and seminal  vesicles are unremarkable in appearance. Other: No significant volume of ascites.  No pneumoperitoneum. Musculoskeletal: There are no aggressive appearing lytic or blastic lesions noted in the visualized portions of the skeleton. IMPRESSION: 1. New large left hydropneumothorax. 2. Widespread metastatic disease to the lungs appears progressive compared to the prior study, as above. 3. Enlarging probably enhancing lesion in the upper pole the left kidney concerning for neoplasm, either metastatic or primary. 4. Small right pleural effusion. 5. Aortic atherosclerosis, in addition to left main and 2 vessel coronary artery disease. Please note that although the presence of coronary artery calcium documents the presence of coronary artery disease, the severity of this disease and any potential stenosis cannot be assessed on this non-gated CT examination. Assessment for potential risk factor modification, dietary therapy or pharmacologic therapy may be warranted, if clinically  indicated. 6. Additional incidental findings, as above. Critical Value/emergent results were called by telephone at the time of interpretation on 02/06/2020 at 11:35 am to provider [facility] , who verbally acknowledged these results. Electronically Signed   By: Vinnie Langton M.D.   On: 02/06/2020 11:36   DG CHEST PORT 1 VIEW  Result Date: 02/21/2020 CLINICAL DATA:  Known pneumothorax. History of lower extremity liposarcoma EXAM: PORTABLE CHEST 1 VIEW COMPARISON:  February 20, 2020 chest radiograph and chest CT February 06, 2020 FINDINGS: Previously noted pneumothorax on the left is unchanged without appreciable tension component. Subcutaneous air noted along the lateral left hemithorax. Multiple mass lesions throughout the lungs, largest in the left upper lobe, remain without change. Atelectatic change noted in right mid lung. No new opacity evident. Heart is upper normal in size with pulmonary vascularity within normal limits. No adenopathy evident. Port-A-Cath tip in superior vena cava. No bone lesions. IMPRESSION: Stable left-sided pneumothorax that tension component. Multiple mass lesions throughout the lungs again noted, largest left upper lobe consistent with known neoplasm. Atelectasis right mid lung. No new opacity evident compared to 1 day prior. Stable cardiac silhouette. Electronically Signed   By: Lowella Grip III M.D.   On: 02/21/2020 08:13   DG Chest Port 1 View  Result Date: 02/20/2020 CLINICAL DATA:  Pneumothorax follow-up EXAM: PORTABLE CHEST 1 VIEW COMPARISON:  February 20, 2020 FINDINGS: There is a persistent unchanged left-sided pneumothorax. There is subcutaneous gas along the patient's left flank. There are airspace opacities involving the left upper lung zone and right lung base. There is a well-positioned right-sided Port-A-Cath. No definite right-sided pneumothorax. There is a small right-sided pleural effusion. IMPRESSION: No significant interval change. Persistent  bilateral airspace opacities in addition to a moderate-sized left-sided pneumothorax. Electronically Signed   By: Constance Holster M.D.   On: 02/20/2020 18:47   DG Chest Port 1 View  Result Date: 02/12/2020 CLINICAL DATA:  60 year old male with history of pneumothorax status post chest tube placement. EXAM: PORTABLE CHEST 1 VIEW COMPARISON:  Chest x-ray 02/09/2020. FINDINGS: Right internal jugular single-lumen power porta cath with tip terminating at the superior cavoatrial junction. Left-sided chest tube in position with tip and side port projecting over the lower left hemithorax. Small persistent left pneumothorax, increased slightly in size compared to the prior study. No right-sided pneumothorax. Multiple pulmonary nodules and masses again noted, better demonstrated on prior chest CT 02/06/2020. No definite acute consolidative airspace disease. No pleural effusions. No evidence of pulmonary edema. Heart size is normal. Upper mediastinal contours are within normal limits. Extensive subcutaneous emphysema in the left chest wall again noted. IMPRESSION: 1. Support apparatus, as above. 2. Increased  size of small left-sided pneumothorax. 3. Multiple pulmonary nodules and masses, similar to prior examinations, presumably reflective of metastatic disease. Electronically Signed   By: Vinnie Langton M.D.   On: 02/12/2020 08:16   DG CHEST PORT 1 VIEW  Result Date: 02/09/2020 CLINICAL DATA:  Follow-up hydropneumothorax. Status post chest tube removal. EXAM: PORTABLE CHEST 1 VIEW COMPARISON:  02/09/2020 FINDINGS: Right chest wall port a catheter is noted with tip at the cavoatrial junction. The left-sided chest tube has been removed. Small left apical pneumothorax persists. Unchanged appearance of gas within the soft tissues of the left chest wall. Masslike opacities within the left upper lobe and right base are unchanged. IMPRESSION: Persistent small left apical pneumothorax status post left-sided chest tube  removal. Electronically Signed   By: Kerby Moors M.D.   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 Recent left pneumothorax. EXAM: PORTABLE CHEST 1 VIEW COMPARISON:  Portable chest 02/08/2020 and earlier. FINDINGS: Portable AP upright view at 0816 hours. Left chest tube is in place although there may be side holes outside of the left pleural space (arrow). A small volume of left chest wall gas has increased. Small residual left pneumothorax. No mediastinal shift. Masslike bilateral pulmonary opacities compatible with known metastases. Stable cardiac size and mediastinal contours. Visualized tracheal air column is within normal limits. Stable right chest porta cath. Negative visible bowel gas pattern. Stable visualized osseous structures. IMPRESSION: 1. Left chest tube in place although there may be tube side holes outside of the pleural space. A small volume of chest wall gas has increased. 2. Stable small residual pneumothorax. 3. Stable pulmonary metastases. Electronically Signed   By: Genevie Ann M.D.   On: 02/09/2020 08:25   DG CHEST PORT 1 VIEW  Result Date: 02/08/2020 CLINICAL DATA:  Hydropneumothorax, chest tube on LEFT EXAM: PORTABLE CHEST 1 VIEW COMPARISON:  Portable exam 4193 hours compared to 02/07/2020 FINDINGS: Pigtail RIGHT thoracostomy tube, partially withdrawn since previous exam with tip projecting at LEFT costal margin. Persistent small LEFT apex pneumothorax. Basilar LEFT pleural effusion. Normal heart size mediastinal contours. RIGHT jugular Port-A-Cath stable tip projecting over SVC. Atelectasis versus infiltrate at RIGHT base. BILATERAL pulmonary nodules, largest a mass at LEFT apex. Bones demineralized. IMPRESSION: Persistent small LEFT hydropneumothorax. LEFT thoracostomy tube has been partially withdrawn with the pigtail now at LEFT costal margin. Electronically Signed   By: Lavonia Dana M.D.   On:  02/08/2020 15:11   DG Chest Portable 1 View  Result Date: 02/07/2020 CLINICAL DATA:  Follow-up left pneumothorax.  Lung carcinoma. EXAM: PORTABLE CHEST 1 VIEW COMPARISON:  Prior today FINDINGS: There has been placement of a left pleural pigtail catheter since previous study, with near complete resolution of left pneumothorax since prior exam. Multiple pulmonary nodules and masses are again seen in both lungs, largest in the left upper lobe and right lower lobe. Small right pleural effusion remains stable. Right-sided Port-A-Cath remains in appropriate position. IMPRESSION: Near complete resolution of left pneumothorax following left pleural catheter placement. Electronically Signed   By: Marlaine Hind M.D.   On: 02/07/2020 14:55   DG Chest Portable 1 View  Result Date: 02/07/2020 CLINICAL DATA:  Left pneumothorax. EXAM: PORTABLE CHEST 1 VIEW COMPARISON:  CT 02/06/2020. FINDINGS: Mediastinum hilar structures normal. Borderline cardiomegaly. Large left pneumothorax noted. Similar finding noted on prior CT. Prominent atelectasis left lung. Multiple prominent bilateral pulmonary masses again noted as noted on prior  CT. No acute bony abnormality identified. Contrast in the colon. IMPRESSION: 1. Large left pneumothorax. Similar finding noted on prior CT. Prominent atelectasis left lung. 2. Multiple prominent bilateral pulmonary masses again noted as noted on prior CT. Electronically Signed   By: Marcello Moores  Register   On: 02/07/2020 13:50   DG Chest Port 1V same Day  Result Date: 02/09/2020 CLINICAL DATA:  Shortness of breath, history of metastatic lung carcinoma EXAM: PORTABLE CHEST 1 VIEW COMPARISON:  Film from earlier in the same day. FINDINGS: Cardiac shadow is stable. Left-sided chest tube is again identified. Small left apical pneumothorax is again identified stable in appearance. Stable left upper lobe mass lesion is noted. Nodules are noted in the right lung as well as patchy atelectatic changes in the  bases bilaterally. Considerable subcutaneous emphysema is noted. No bony abnormality is seen. Right chest wall port is noted. IMPRESSION: Stable left apical pneumothorax with chest tube in place. Changes of metastatic disease within the lungs. Electronically Signed   By: Inez Catalina M.D.   On: 02/09/2020 23:23   DG Chest Port 1V same Day  Result Date: 02/09/2020 CLINICAL DATA:  Pneumothorax.  Chest tube placement. EXAM: PORTABLE CHEST 1 VIEW COMPARISON:  February 09, 2020 FINDINGS: There has been interval placement of a left-sided chest tube. There is a small residual left-sided pneumothorax, improved from prior study. The right-sided Port-A-Cath is well position. Bilateral airspace opacities are again noted, not substantially changed from prior study. There is subcutaneous emphysema along the patient's left flank, stable to improved from prior study. The heart size is unchanged. IMPRESSION: 1. Interval placement of a left-sided chest tube. 2. Small residual left-sided pneumothorax, improved from prior study. 3. Otherwise, no significant short interval change. Electronically Signed   By: Constance Holster M.D.   On: 02/09/2020 16:25   DG Chest Port 1V same Day  Result Date: 02/09/2020 CLINICAL DATA:  Chest tube removal EXAM: PORTABLE CHEST 1 VIEW COMPARISON:  02/09/2020 FINDINGS: Further increased size of the left pneumothorax, now noted laterally and at the left apex, approximately 15%. Right Port-A-Cath remains in place, unchanged. Airspace opacity at the right lung base is unchanged. Heart is borderline in size. Subcutaneous emphysema throughout the left chest wall. IMPRESSION: Enlarging left pneumothorax, now approximately 15%. Otherwise no change. These results will be called to the ordering clinician or representative by the Radiologist Assistant, and communication documented in the PACS or [employer]. Electronically Signed   By: Rolm Baptise M.D.   On: 02/09/2020 15:33    Lab  Data:  CBC: Recent Labs  Lab 02/20/20 1811 02/21/20 0010  WBC 8.5 7.6  NEUTROABS 6.3  --   HGB 14.1 12.5*  HCT 43.3 37.7*  MCV 98.2 97.7  PLT 415* 347   Basic Metabolic Panel: Recent Labs  Lab 02/20/20 1811 02/21/20 0010  NA 137 139  K 3.8 3.7  CL 106 107  CO2 21* 23  GLUCOSE 99 84  BUN 9 9  CREATININE 0.75 0.69  CALCIUM 9.2 8.7*   GFR: Estimated Creatinine Clearance: 112.5 mL/min (by C-G formula based on SCr of 0.69 mg/dL). Liver Function Tests: Recent Labs  Lab 02/20/20 1811 02/21/20 0010  AST 43* 40  ALT 32 28  ALKPHOS 133* 118  BILITOT 0.8 0.9  PROT 7.0 6.1*  ALBUMIN 3.4* 2.9*   No results for input(s): LIPASE, AMYLASE in the last 168 hours. No results for input(s): AMMONIA in the last 168 hours. Coagulation Profile: Recent Labs  Lab 02/20/20 1811  INR 1.1   Cardiac Enzymes: No results for input(s): CKTOTAL, CKMB, CKMBINDEX, TROPONINI in the last 168 hours. BNP (last 3 results) No results for input(s): PROBNP in the last 8760 hours. HbA1C: No results for input(s): HGBA1C in the last 72 hours. CBG: No results for input(s): GLUCAP in the last 168 hours. Lipid Profile: No results for input(s): CHOL, HDL, LDLCALC, TRIG, CHOLHDL, LDLDIRECT in the last 72 hours. Thyroid Function Tests: No results for input(s): TSH, T4TOTAL, FREET4, T3FREE, THYROIDAB in the last 72 hours. Anemia Panel: No results for input(s): VITAMINB12, FOLATE, FERRITIN, TIBC, IRON, RETICCTPCT in the last 72 hours. Urine analysis: No results found for: COLORURINE, APPEARANCEUR, LABSPEC, PHURINE, GLUCOSEU, HGBUR, BILIRUBINUR, KETONESUR, PROTEINUR, UROBILINOGEN, NITRITE, LEUKOCYTESUR   Sahira Cataldi M.D. [facility] 02/21/2020, 10:49 AM   Call night coverage person covering after 7pm

## 2020-02-21 NOTE — ED Notes (Signed)
Sandwich bag and graham crackers and water given to pt

## 2020-02-21 NOTE — ED Notes (Signed)
This [facility] pharmacy for verification of PRN Hycodan per pt request & pt stating that his cough has returned

## 2020-02-21 NOTE — ED Notes (Signed)
Witnessed waste of 50 mcg of Fentanyl with Domingo Mend, RN. Pt has gone to the floor and no longer available in [facility] to waste medication.

## 2020-02-21 NOTE — ED Notes (Signed)
Wasted 50 mcg of fentanyl with Zoe, Therapist, sports. Pt upstairs, no longer visible in pyxis.

## 2020-02-21 NOTE — ED Notes (Signed)
Lunch Tray Ordered @ 1046. 

## 2020-02-22 ENCOUNTER — Inpatient Hospital Stay (HOSPITAL_COMMUNITY): Payer: 59

## 2020-02-22 DIAGNOSIS — E860 Dehydration: Secondary | ICD-10-CM | POA: Diagnosis not present

## 2020-02-22 DIAGNOSIS — R1319 Other dysphagia: Secondary | ICD-10-CM | POA: Diagnosis not present

## 2020-02-22 DIAGNOSIS — J948 Other specified pleural conditions: Secondary | ICD-10-CM | POA: Diagnosis not present

## 2020-02-22 DIAGNOSIS — J9383 Other pneumothorax: Secondary | ICD-10-CM | POA: Diagnosis not present

## 2020-02-22 LAB — BASIC METABOLIC PANEL
Anion gap: 8 (ref 5–15)
BUN: 9 mg/dL (ref 6–20)
CO2: 23 mmol/L (ref 22–32)
Calcium: 8.4 mg/dL — ABNORMAL LOW (ref 8.9–10.3)
Chloride: 107 mmol/L (ref 98–111)
Creatinine, Ser: 0.68 mg/dL (ref 0.61–1.24)
GFR, Estimated: >60 mL/min (ref >60)
Glucose, Bld: 101 mg/dL — ABNORMAL HIGH (ref 70–99)
Potassium: 3.8 mmol/L (ref 3.5–5.1)
Sodium: 138 mmol/L (ref 135–145)

## 2020-02-22 LAB — CBC
HCT: 34.9 % — ABNORMAL LOW (ref 39.0–52.0)
Hemoglobin: 11 g/dL — ABNORMAL LOW (ref 13.0–17.0)
MCH: 30.8 pg (ref 26.0–34.0)
MCHC: 31.5 g/dL (ref 30.0–36.0)
MCV: 97.8 fL (ref 80.0–100.0)
Platelets: 315 10*3/uL (ref 150–400)
RBC: 3.57 MIL/uL — ABNORMAL LOW (ref 4.22–5.81)
RDW: 16.4 % — ABNORMAL HIGH (ref 11.5–15.5)
WBC: 6.1 10*3/uL (ref 4.0–10.5)
nRBC: 0 % (ref 0.0–0.2)

## 2020-02-22 NOTE — Progress Notes (Signed)
[facility]                                                                              Patient Demographics  Leonard [name], is a 60 y.o. male, DOB - 03-Jul-1960, JGG:[id number]  Admit date - 02/20/2020   Admitting Physician Elwyn Reach, MD  Outpatient Primary MD for the patient is Celene Squibb, MD  Outpatient specialists:   LOS - 2  days   Medical records reviewed and are as summarized below:    Chief Complaint  Patient presents with  . 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  At that time he had more than 50% left-sided pneumothorax that was spontaneous.  Patient apparently had been doing fine until the morning of admission, when he had a coughing spell, started feeling more short of breath and pain on the left side of the chest.  Patient went to see his oncologist, x-ray showed reaccumulation of pneumothorax.  Patient had hydropneumothorax associated with the necrotic malignant mass. TCTS was consulted by EDP, recommended admission for observation. Chest x-ray showed persistent bilateral airspace opacity in addition to a moderate-sized left sided hydropneumothorax.  Assessment & Plan    Principal Problem:   Spontaneous hydropneumothorax, recurrent in the setting of metastatic disease to the lung, LLL malignant necrotic mass -Stable, sats 96% on room air, status post chest tube placement on 2/9 -CCM following, repeat chest x-ray in a.m. 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  Oncology felt further chemotherapy would not be helpful due to progression of the disease. -high risk of readmissions, recurrent hydropneumothorax, metastatic malignancy.  Palliative care was consulted during previous admission, not seen on 2/1, recommended outpatient palliative to follow    Esophageal dysphagia -Tolerating regular diet, no issues    Dehydration -Continue gentle hydration today  Anxiety, depression -Continue Lexapro, Xanax   Code Status: Full CODE STATUS DVT Prophylaxis:     Level of Care: Level of care: Telemetry Surgical Family Communication: Discussed all imaging results, lab results, explained to the patient   Disposition Plan:     Status is: Inpatient  Remains inpatient appropriate because:Inpatient level of care appropriate due to severity of illness   Dispo: The patient is from: Home              Anticipated d/c is to: Home              Anticipated d/c date is: > 3 days              Patient currently is not medically stable to d/c.   Difficult to place patient No      Time  Spent in minutes   25 minutes  Procedures:  Chest tube placement 02/21/2020  Consultants:   TCTS PCCM  Antimicrobials:   Anti-infectives (From admission, onward)   None         Medications  Scheduled Meds: . ALPRAZolam  0.25 mg Oral QHS  . benzonatate  200 mg Oral TID  . Chlorhexidine Gluconate Cloth  6 each Topical Daily  . escitalopram  10 mg Oral QHS  . gabapentin  300 mg Oral TID  . lidocaine-EPINEPHrine  10 mL Infiltration Once  . sodium chloride flush  10 mL Intracatheter Q8H  . zolpidem  10 mg Oral QHS   Continuous Infusions: . sodium chloride 75 mL/hr at 02/22/20 0031   PRN Meds:.ALPRAZolam, benzonatate, HYDROcodone-homatropine, HYDROmorphone (DILAUDID) injection, ondansetron **OR** ondansetron (ZOFRAN) IV, oxyCODONE, pantoprazole, prochlorperazine      Subjective:   Fuller Reise was seen and examined today.  No complaints.   Status post chest tube placement.  Pain better controlled with the Dilaudid.  Wheezing Rt lung..  Patient denies  abdominal pain, N/V/D/C, new weakness, numbess, tingling.  No fevers  Objective:   Vitals:   02/21/20 1211 02/21/20 1924 02/22/20 0504 02/22/20 0900  BP: 121/80 119/79 108/75 121/83  Pulse: 84 82 91 88  Resp: 18 20 18 20   Temp: 97.7 F (36.5 C) 98.3 F (36.8 C) 97.9 F (36.6 C) 98.1 F (36.7 C)  TempSrc: Oral Oral Oral Oral  SpO2: 97% 94% 94% 96%  Weight:      Height:        Intake/Output Summary (Last 24 hours) at 02/22/2020 1248 Last data filed at 02/22/2020 1100 Gross per 24 hour  Intake 240 ml  Output 1694 ml  Net -1454 ml     Wt Readings from Last 3 Encounters:  02/20/20 93 kg  02/20/20 93 kg  02/16/20 94.4 kg   Physical Exam  General: Alert and oriented x 3, NAD  Cardiovascular: S1 S2 clear, RRR. No pedal edema b/l  Respiratory: Fairly clear BS on left, expiratory wheezing on Rt, no air leak on chest tube  Gastrointestinal: Soft, nontender, nondistended, NBS  Ext: no pedal edema bilaterally  Neuro: no new deficits  Musculoskeletal: No cyanosis, clubbing  Skin: No rashes  Psych: Normal affect and demeanor, alert and oriented x3    Data Reviewed:  I have personally reviewed following labs and imaging studies  Micro Results Recent Results (from the past 240 hour(s))  Resp Panel by RT-PCR (Flu A&B, Covid) Nasopharyngeal Swab     Status: None   Collection Time: 02/20/20  6:30 PM   Specimen: Nasopharyngeal Swab; Nasopharyngeal(NP) swabs in vial transport medium  Result Value Ref Range Status   SARS Coronavirus 2 by RT PCR NEGATIVE NEGATIVE Final    Comment: (NOTE) SARS-CoV-2 target nucleic acids are NOT DETECTED.  The SARS-CoV-2 RNA is generally detectable in upper respiratory specimens during the acute phase of infection. The lowest concentration of SARS-CoV-2 viral copies this assay can detect is 138 copies/mL. A negative result does  not preclude SARS-Cov-2 infection and should not be used as the sole basis for treatment or other patient management decisions. A negative result may occur with  improper specimen collection/handling, submission of specimen other than nasopharyngeal swab, presence of viral mutation(s) within the areas targeted by this assay, and inadequate number of viral copies(<138 copies/mL). A negative result must be combined with clinical observations, patient history, and epidemiological information. The expected result is Negative.  Fact Sheet for Patients:  EntrepreneurPulse.com.au  Fact Sheet for Healthcare Providers:  IncredibleEmployment.be  This test is no t yet approved or cleared by the [place] FDA and  has been authorized for detection and/or diagnosis of SARS-CoV-2 by FDA under an Emergency Use Authorization (EUA). This EUA will remain  in effect (meaning this test can be used) for the duration of the COVID-19 declaration under Section 564(b)(1) of the Act, 21 U.S.C.section 360bbb-3(b)(1), unless the authorization is terminated  or revoked sooner.       Influenza A by PCR NEGATIVE NEGATIVE Final   Influenza B by PCR NEGATIVE NEGATIVE Final    Comment: (NOTE) The Xpert Xpress SARS-CoV-2/FLU/RSV plus assay is intended as an aid in the diagnosis of influenza from Nasopharyngeal swab specimens and should not be used as a sole basis for treatment. Nasal washings and aspirates are unacceptable for Xpert Xpress SARS-CoV-2/FLU/RSV testing.  Fact Sheet for Patients: EntrepreneurPulse.com.au  Fact Sheet for Healthcare Providers: IncredibleEmployment.be  This test is not yet approved or cleared by the [place] FDA and has been authorized for detection and/or diagnosis of SARS-CoV-2 by FDA under an Emergency Use Authorization (EUA). This EUA will remain in effect (meaning this test can be used) for the  duration of the COVID-19 declaration under Section 564(b)(1) of the Act, 21 U.S.C. section 360bbb-3(b)(1), unless the authorization is terminated or revoked.  Performed at [facility], [place] [address] [place], [place] [address]     Radiology Reports DG Chest 2 View  Result Date: 02/20/2020 CLINICAL DATA:  Metastatic sarcoma.  Follow-up pneumothorax EXAM: CHEST - 2 VIEW COMPARISON:  02/15/2020 FINDINGS: Progression of left pneumothorax now approximately 30-40%. Small amount of pleural fluid in the left lung base. Gas in the chest wall on the left is unchanged. Multiple bilateral lung nodules most compatible with metastatic disease, unchanged. Port-A-Cath tip SVC unchanged. Small right pleural effusion. IMPRESSION: Interval progression of left pneumothorax approximately 30-40%. These results were called by telephone at the time of interpretation on 02/20/2020 at 3:53 pm to provider [facility] , who verbally acknowledged these results. Electronically Signed   By: Franchot Gallo M.D.   On: 02/20/2020 15:53   DG Chest 2 View  Result Date: 02/15/2020 CLINICAL DATA:  Pneumothorax, recent chest tube removal, history of lung cancer EXAM: CHEST - 2 VIEW COMPARISON:  Radiograph 02/14/2020, CT 02/06/2020 FINDINGS: Accessed right IJ approach Port-A-Cath tip terminates in the lower SVC. Telemetry leads and support devices overlie the chest. Cholecystectomy clips in the right upper quadrant. Persistent subcutaneous emphysema extending across the left chest wall. Suspect small residual left apical visceral pleural line with additional increasing lucency along the left heart and mediastinal borders which could reflect a pneumomediastinum or less likely medial pneumothorax. Small bilateral effusions. Persistent heterogeneous opacities are again seen in the mid to lower lungs with additional bilateral pulmonary masses, largest in the left lung apex. No acute or worrisome osseous abnormality. IMPRESSION: 1.  Suspect small residual left apical visceral pleural line/apical pneumothorax. Additional increasing lucency along the left heart and mediastinal borders which could reflect a pneumomediastinum or less likely medial pneumothorax component. Small bilateral effusions. 2. Extensive subcutaneous emphysema remains of the left chest wall. 3. Persistent heterogeneous opacities in the mid to lower lungs, favor edema and atelectasis. 4. Redemonstrated pulmonary masses, largest in the left apex. Electronically Signed   By: Lovena Le M.D.   On: 02/15/2020 06:46   DG Chest 2 View  Result Date: 02/14/2020 CLINICAL DATA:  Pneumothorax EXAM: CHEST - 2 VIEW  COMPARISON:  Chest x-ray 02/13/2020 FINDINGS: Right chest wall Port-A-Cath and left chest tube in stable appropriate position. The heart size and mediastinal contours are within normal limits. Persistent small apical left pneumothorax. Persistent bilateral pulmonary masses again noted. Possible trace right pleural effusion. No definite left pleural effusion. No acute osseous abnormality. IMPRESSION: 1. Grossly stable small left apical pneumothorax. 2. Trace right pleural effusion. 3. Persistent bilateral pulmonary masses. 4. Lines and tubes in stable position. Electronically Signed   By: Iven Finn M.D.   On: 02/14/2020 06:54   DG Chest 2 View  Result Date: 02/13/2020 CLINICAL DATA:  Chest tube. EXAM: CHEST - 2 VIEW COMPARISON:  02/12/2020. FINDINGS: Left chest tube in stable position. Left pneumothorax improved from prior exam with tiny residual left apical pneumothorax. Persistent bilateral pulmonary masses again noted. Small right pleural effusion. Heart size stable. Degenerative change thoracic spine. IMPRESSION: 1. Left chest tube in stable position. Left pneumothorax improved from prior exam with tiny residual left apical pneumothorax. 2. Persistent bilateral pulmonary masses again noted. Small right pleural effusion. Electronically Signed   By: Marcello Moores   Register   On: 02/13/2020 07:00   CT CHEST ABDOMEN PELVIS W CONTRAST  Result Date: 02/06/2020 CLINICAL DATA:  60 year old male with history of liposarcoma in the left lower extremity. EXAM: CT CHEST, ABDOMEN, AND PELVIS WITH CONTRAST TECHNIQUE: Multidetector CT imaging of the chest, abdomen and pelvis was performed following the standard protocol during bolus administration of intravenous contrast. CONTRAST:  150mL OMNIPAQUE IOHEXOL 300 MG/ML  SOLN COMPARISON:  CT the chest, abdomen and pelvis 11/01/2019. FINDINGS: CT CHEST FINDINGS Cardiovascular: Heart size is normal. Trace amount of pericardial fluid and/or thickening, unlikely to be of hemodynamic significance at this time. No associated pericardial calcification. There is aortic atherosclerosis, as well as atherosclerosis of the great vessels of the mediastinum and the coronary arteries, including calcified atherosclerotic plaque in the left main, left anterior descending and right coronary arteries.Right internal jugular single-lumen porta cath with tip terminating at the superior cavoatrial junction. Mediastinum/Nodes: No pathologically enlarged mediastinal or hilar lymph nodes. Esophagus is unremarkable in appearance. No axillary lymphadenopathy. Lungs/Pleura: Multiple large pulmonary nodules and masses are again noted in the lungs bilaterally, generally increased in size compared to the prior examination. The largest of these is in the left upper lobe near the apex (axial image 17 of series 2) measuring 5.5 x 5.6 cm, significantly increased from 2.1 x 1.8 cm on the prior examination. This lesion is centrally low-attenuation, suggesting internal areas of necrosis. Small bilateral pleural effusions. New large left pneumothorax with considerable atelectasis in the left lung. Musculoskeletal: There are no aggressive appearing lytic or blastic lesions noted in the visualized portions of the skeleton. CT ABDOMEN PELVIS FINDINGS Hepatobiliary: No suspicious  cystic or solid hepatic lesions. No intra or extrahepatic biliary ductal dilatation. Status post cholecystectomy. Pancreas: No pancreatic mass. No pancreatic ductal dilatation. No pancreatic or peripancreatic fluid collections or inflammatory changes. Spleen: Unremarkable. Adrenals/Urinary Tract: In the upper pole the left kidney there is enhancing lesion measuring 1.7 cm in diameter, increased in size compared to the prior study (previously 1.2 cm), concerning for neoplasm. Exophytic 1.2 cm low-attenuation lesion in the lower pole the left kidney, compatible with a small simple cyst. Right kidney and bilateral adrenal glands are normal in appearance. No hydroureteronephrosis. Urinary bladder is normal in appearance. Stomach/Bowel: The appearance of the stomach is normal. There is no pathologic dilatation of small bowel or colon. Normal appendix. Vascular/Lymphatic: Aortic atherosclerosis, without evidence of  aneurysm or dissection in the abdominal or pelvic vasculature. No lymphadenopathy noted in the abdomen or pelvis. Reproductive: Prostate gland and seminal vesicles are unremarkable in appearance. Other: No significant volume of ascites.  No pneumoperitoneum. Musculoskeletal: There are no aggressive appearing lytic or blastic lesions noted in the visualized portions of the skeleton. IMPRESSION: 1. New large left hydropneumothorax. 2. Widespread metastatic disease to the lungs appears progressive compared to the prior study, as above. 3. Enlarging probably enhancing lesion in the upper pole the left kidney concerning for neoplasm, either metastatic or primary. 4. Small right pleural effusion. 5. Aortic atherosclerosis, in addition to left main and 2 vessel coronary artery disease. Please note that although the presence of coronary artery calcium documents the presence of coronary artery disease, the severity of this disease and any potential stenosis cannot be assessed on this non-gated CT examination. Assessment  for potential risk factor modification, dietary therapy or pharmacologic therapy may be warranted, if clinically indicated. 6. Additional incidental findings, as above. Critical Value/emergent results were called by telephone at the time of interpretation on 02/06/2020 at 11:35 am to provider [place] , who verbally acknowledged these results. Electronically Signed   By: Vinnie Langton M.D.   On: 02/06/2020 11:36   DG CHEST PORT 1 VIEW  Result Date: 02/22/2020 CLINICAL DATA:  Follow-up chest tube EXAM: PORTABLE CHEST 1 VIEW COMPARISON:  02/21/2020 FINDINGS: Cardiac shadow is enlarged but stable. Right chest wall port is again seen. Bilateral metastatic lesions are again identified and stable. Pigtail catheter is noted on the left. Small pneumothorax is again seen. No new focal abnormality is noted. IMPRESSION: Stable small left pneumothorax. Multifocal pulmonary metastatic disease as described. Electronically Signed   By: Inez Catalina M.D.   On: 02/22/2020 10:06   DG Chest Portable 1 View  Result Date: 02/21/2020 CLINICAL DATA:  Chest tube placement for pneumothorax EXAM: PORTABLE CHEST 1 VIEW COMPARISON:  February 21, 2020 study obtained earlier in the day FINDINGS: Chest tube now present on the left with tip directed medially. Small residual lateral pneumothorax without tension component. Subcutaneous air noted on the left. Multiple mass lesions again noted throughout the lungs, largest left upper lobe region. Atelectatic change again noted in right mid and lower lung regions. Heart size and pulmonary vascularity are normal. No adenopathy evident. Port-A-Cath tip in superior vena cava. IMPRESSION: Left-sided pneumothorax much smaller after chest tube placement. No tension component. 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 Atelectatic change again noted on the right. Stable cardiac silhouette. Electronically Signed   By: Lowella Grip III M.D.   On: 02/21/2020 11:25   DG  CHEST PORT 1 VIEW  Result Date: 02/21/2020 CLINICAL DATA:  Known pneumothorax. History of lower extremity liposarcoma EXAM: PORTABLE CHEST 1 VIEW COMPARISON:  February 20, 2020 chest radiograph and chest CT February 06, 2020 FINDINGS: Previously noted pneumothorax on the left is unchanged without appreciable tension component. Subcutaneous air noted along the lateral left hemithorax. Multiple mass lesions throughout the lungs, largest in the left upper lobe, remain without change. Atelectatic change noted in right mid lung. No new opacity evident. Heart is upper normal in size with pulmonary vascularity within normal limits. No adenopathy evident. Port-A-Cath tip in superior vena cava. No bone lesions. IMPRESSION: Stable left-sided pneumothorax that tension component. Multiple mass lesions throughout the lungs again noted, largest left upper lobe consistent with known neoplasm. Atelectasis right mid lung. No new opacity evident compared to 1 day prior. Stable cardiac silhouette. Electronically Signed  By: Lowella Grip III M.D.   On: 02/21/2020 08:13   DG Chest Port 1 View  Result Date: 02/20/2020 CLINICAL DATA:  Pneumothorax follow-up EXAM: PORTABLE CHEST 1 VIEW COMPARISON:  February 20, 2020 FINDINGS: There is a persistent unchanged left-sided pneumothorax. There is subcutaneous gas along the patient's left flank. There are airspace opacities involving the left upper lung zone and right lung base. There is a well-positioned right-sided Port-A-Cath. No definite right-sided pneumothorax. There is a small right-sided pleural effusion. IMPRESSION: No significant interval change. Persistent bilateral airspace opacities in addition to a moderate-sized left-sided pneumothorax. Electronically Signed   By: Constance Holster M.D.   On: 02/20/2020 18:47   DG Chest Port 1 View  Result Date: 02/12/2020 CLINICAL DATA:  60 year old male with history of pneumothorax status post chest tube placement. EXAM: PORTABLE  CHEST 1 VIEW COMPARISON:  Chest x-ray 02/09/2020. FINDINGS: Right internal jugular single-lumen power porta cath with tip terminating at the superior cavoatrial junction. Left-sided chest tube in position with tip and side port projecting over the lower left hemithorax. Small persistent left pneumothorax, increased slightly in size compared to the prior study. No right-sided pneumothorax. Multiple pulmonary nodules and masses again noted, better demonstrated on prior chest CT 02/06/2020. No definite acute consolidative airspace disease. No pleural effusions. No evidence of pulmonary edema. Heart size is normal. Upper mediastinal contours are within normal limits. Extensive subcutaneous emphysema in the left chest wall again noted. IMPRESSION: 1. Support apparatus, as above. 2. Increased size of small left-sided pneumothorax. 3. Multiple pulmonary nodules and masses, similar to prior examinations, presumably reflective of metastatic disease. Electronically Signed   By: Vinnie Langton M.D.   On: 02/12/2020 08:16   DG CHEST PORT 1 VIEW  Result Date: 02/09/2020 CLINICAL DATA:  Follow-up hydropneumothorax. Status post chest tube removal. EXAM: PORTABLE CHEST 1 VIEW COMPARISON:  02/09/2020 FINDINGS: Right chest wall port a catheter is noted with tip at the cavoatrial junction. The left-sided chest tube has been removed. Small left apical pneumothorax persists. Unchanged appearance of gas within the soft tissues of the left chest wall. Masslike opacities within the left upper lobe and right base are unchanged. IMPRESSION: Persistent small left apical pneumothorax status post left-sided chest tube removal. Electronically Signed   By: Kerby Moors M.D.   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 Recent left pneumothorax. EXAM: PORTABLE CHEST 1 VIEW COMPARISON:  Portable chest 02/08/2020 and earlier. FINDINGS: Portable AP upright  view at 0816 hours. Left chest tube is in place although there may be side holes outside of the left pleural space (arrow). A small volume of left chest wall gas has increased. Small residual left pneumothorax. No mediastinal shift. Masslike bilateral pulmonary opacities compatible with known metastases. Stable cardiac size and mediastinal contours. Visualized tracheal air column is within normal limits. Stable right chest porta cath. Negative visible bowel gas pattern. Stable visualized osseous structures. IMPRESSION: 1. Left chest tube in place although there may be tube side holes outside of the pleural space. A small volume of chest wall gas has increased. 2. Stable small residual pneumothorax. 3. Stable pulmonary metastases. Electronically Signed   By: Genevie Ann M.D.   On: 02/09/2020 08:25   DG CHEST PORT 1 VIEW  Result Date: 02/08/2020 CLINICAL DATA:  Hydropneumothorax, chest tube on LEFT EXAM: PORTABLE CHEST 1 VIEW COMPARISON:  Portable exam 1449 hours compared to 02/07/2020 FINDINGS: Pigtail RIGHT thoracostomy  tube, partially withdrawn since previous exam with tip projecting at LEFT costal margin. Persistent small LEFT apex pneumothorax. Basilar LEFT pleural effusion. Normal heart size mediastinal contours. RIGHT jugular Port-A-Cath stable tip projecting over SVC. Atelectasis versus infiltrate at RIGHT base. BILATERAL pulmonary nodules, largest a mass at LEFT apex. Bones demineralized. IMPRESSION: Persistent small LEFT hydropneumothorax. LEFT thoracostomy tube has been partially withdrawn with the pigtail now at LEFT costal margin. Electronically Signed   By: Lavonia Dana M.D.   On: 02/08/2020 15:11   DG Chest Portable 1 View  Result Date: 02/07/2020 CLINICAL DATA:  Follow-up left pneumothorax.  Lung carcinoma. EXAM: PORTABLE CHEST 1 VIEW COMPARISON:  Prior today FINDINGS: There has been placement of a left pleural pigtail catheter since previous study, with near complete resolution of left pneumothorax  since prior exam. Multiple pulmonary nodules and masses are again seen in both lungs, largest in the left upper lobe and right lower lobe. Small right pleural effusion remains stable. Right-sided Port-A-Cath remains in appropriate position. IMPRESSION: Near complete resolution of left pneumothorax following left pleural catheter placement. Electronically Signed   By: Marlaine Hind M.D.   On: 02/07/2020 14:55   DG Chest Portable 1 View  Result Date: 02/07/2020 CLINICAL DATA:  Left pneumothorax. EXAM: PORTABLE CHEST 1 VIEW COMPARISON:  CT 02/06/2020. FINDINGS: Mediastinum hilar structures normal. Borderline cardiomegaly. Large left pneumothorax noted. Similar finding noted on prior CT. Prominent atelectasis left lung. Multiple prominent bilateral pulmonary masses again noted as noted on prior CT. No acute bony abnormality identified. Contrast in the colon. IMPRESSION: 1. Large left pneumothorax. Similar finding noted on prior CT. Prominent atelectasis left lung. 2. Multiple prominent bilateral pulmonary masses again noted as noted on prior CT. Electronically Signed   By: Marcello Moores  Register   On: 02/07/2020 13:50   DG Chest Port 1V same Day  Result Date: 02/09/2020 CLINICAL DATA:  Shortness of breath, history of metastatic lung carcinoma EXAM: PORTABLE CHEST 1 VIEW COMPARISON:  Film from earlier in the same day. FINDINGS: Cardiac shadow is stable. Left-sided chest tube is again identified. Small left apical pneumothorax is again identified stable in appearance. Stable left upper lobe mass lesion is noted. Nodules are noted in the right lung as well as patchy atelectatic changes in the bases bilaterally. Considerable subcutaneous emphysema is noted. No bony abnormality is seen. Right chest wall port is noted. IMPRESSION: Stable left apical pneumothorax with chest tube in place. Changes of metastatic disease within the lungs. Electronically Signed   By: Inez Catalina M.D.   On: 02/09/2020 23:23   DG Chest Port 1V  same Day  Result Date: 02/09/2020 CLINICAL DATA:  Pneumothorax.  Chest tube placement. EXAM: PORTABLE CHEST 1 VIEW COMPARISON:  February 09, 2020 FINDINGS: There has been interval placement of a left-sided chest tube. There is a small residual left-sided pneumothorax, improved from prior study. The right-sided Port-A-Cath is well position. Bilateral airspace opacities are again noted, not substantially changed from prior study. There is subcutaneous emphysema along the patient's left flank, stable to improved from prior study. The heart size is unchanged. IMPRESSION: 1. Interval placement of a left-sided chest tube. 2. Small residual left-sided pneumothorax, improved from prior study. 3. Otherwise, no significant short interval change. Electronically Signed   By: Constance Holster M.D.   On: 02/09/2020 16:25   DG Chest Port 1V same Day  Result Date: 02/09/2020 CLINICAL DATA:  Chest tube removal EXAM: PORTABLE CHEST 1 VIEW COMPARISON:  02/09/2020 FINDINGS: Further increased size of the left  pneumothorax, now noted laterally and at the left apex, approximately 15%. Right Port-A-Cath remains in place, unchanged. Airspace opacity at the right lung base is unchanged. Heart is borderline in size. Subcutaneous emphysema throughout the left chest wall. IMPRESSION: Enlarging left pneumothorax, now approximately 15%. Otherwise no change. These results will be called to the ordering clinician or representative by the Radiologist Assistant, and communication documented in the PACS or [employer]. Electronically Signed   By: Rolm Baptise M.D.   On: 02/09/2020 15:33    Lab Data:  CBC: Recent Labs  Lab 02/20/20 1811 02/21/20 0010 02/22/20 0222  WBC 8.5 7.6 6.1  NEUTROABS 6.3  --   --   HGB 14.1 12.5* 11.0*  HCT 43.3 37.7* 34.9*  MCV 98.2 97.7 97.8  PLT 415* 365 338   Basic Metabolic Panel: Recent Labs  Lab 02/20/20 1811 02/21/20 0010 02/22/20 0222  NA 137 139 138  K 3.8 3.7 3.8  CL 106 107 107   CO2 21* 23 23  GLUCOSE 99 84 101*  BUN 9 9 9   CREATININE 0.75 0.69 0.68  CALCIUM 9.2 8.7* 8.4*   GFR: Estimated Creatinine Clearance: 112.5 mL/min (by C-G formula based on SCr of 0.68 mg/dL). Liver Function Tests: Recent Labs  Lab 02/20/20 1811 02/21/20 0010  AST 43* 40  ALT 32 28  ALKPHOS 133* 118  BILITOT 0.8 0.9  PROT 7.0 6.1*  ALBUMIN 3.4* 2.9*   No results for input(s): LIPASE, AMYLASE in the last 168 hours. No results for input(s): AMMONIA in the last 168 hours. Coagulation Profile: Recent Labs  Lab 02/20/20 1811  INR 1.1   Cardiac Enzymes: No results for input(s): CKTOTAL, CKMB, CKMBINDEX, TROPONINI in the last 168 hours. BNP (last 3 results) No results for input(s): PROBNP in the last 8760 hours. HbA1C: No results for input(s): HGBA1C in the last 72 hours. CBG: No results for input(s): GLUCAP in the last 168 hours. Lipid Profile: No results for input(s): CHOL, HDL, LDLCALC, TRIG, CHOLHDL, LDLDIRECT in the last 72 hours. Thyroid Function Tests: No results for input(s): TSH, T4TOTAL, FREET4, T3FREE, THYROIDAB in the last 72 hours. Anemia Panel: No results for input(s): VITAMINB12, FOLATE, FERRITIN, TIBC, IRON, RETICCTPCT in the last 72 hours. Urine analysis: No results found for: COLORURINE, APPEARANCEUR, LABSPEC, PHURINE, GLUCOSEU, HGBUR, BILIRUBINUR, KETONESUR, PROTEINUR, UROBILINOGEN, NITRITE, LEUKOCYTESUR   Milburn Freeney M.D. [facility] 02/22/2020, 12:48 PM   Call night coverage person covering after 7pm

## 2020-02-22 NOTE — Progress Notes (Signed)
NAME:  Leonard [name], MRN:  [id number], DOB:  Jan 13, 1961, LOS: 2 ADMISSION DATE:  02/20/2020, CONSULTATION DATE:  02/22/20 REFERRING MD:  Tana Coast, CHIEF COMPLAINT:  Shortness of breath   Brief History:  60 y.o. M with PMH of metastatic Liposarcoma s/p 7 cycles chemotherapy now on palliation therapy with recurrent LLL hydropneumothorax.      History of Present Illness:  60 y.o. M with PMH of metastatic Liposarcoma s/p 7 cycles chemotherapy now on palliation therapy with recurrent LLL hydropneumothorax.   Hospitalized 1/26-2/4 requiring pigtail chest tube and then large bore chest tube.  presented with left pneumothorax 1/26, left pigtail placed, air leak resolved and pigtail was removed on 28th with collapse of left lung requiring placement of large bore chest tube.  Air leak again resolved, this tube was clamped for 3 days prior to removal.   Pt reports he was feeling fairly well after discharge, coughed hard the morning of 2/8 and felt worsening shortness of breath and had some drainage from the site of prior chest tube.  He saw his oncologist who did outpatient CXR showing re-accumulation of pneumothorax, so was sent to the ED.   He was been stable on room air.  CT surgery consulted by ED and did not think that pt was a surgical candidate and recommended admission for observation.    CXR on 2/9 with stable pneumothorax without any tension component, pt does feel slightly worse this morning, but remains on room air.   PCCM consulted for possible repeat chest tube vs. Pleurx.   Past Medical History:   has a past medical history of Cancer ([place]) and Port-A-Cath in place (05/19/2019).  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 Multiple mass lesions throughout the lungs again noted, largest left upper lobe consistent with known neoplasm. Atelectasis right mid lung. No new opacity evident compared to 1 day prior. Stable cardiac silhouette.   Micro Data:  2/8 Covid.19, flu>>negative   Antimicrobials:    Interim History / Subjective:  Complains of blood-tinged sputum No dyspnea   Objective   Blood pressure 121/83, pulse 88, temperature 98.1 F (36.7 C), temperature source Oral, resp. rate 20, height 5\' 10"  (1.778 m), weight 93 kg, SpO2 96 %.        Intake/Output Summary (Last 24 hours) at 02/22/2020 1115 Last data filed at 02/22/2020 1100 Gross per 24 hour  Intake 240 ml  Output 1694 ml  Net -1454 ml   Filed Weights   02/20/20 1735  Weight: 93 kg   General:  Well-nourished M, fatigued-appearing, no distress HEENT: MM pink/moist,  Neuro: awake and alert, oriented conversational CV: s1s2 rrr, no m/r/g PULM: No accessory muscle use, no air leak on chest tube, clear breath sounds on left, amber-yellow fluid drainage GI: soft, bsx4 active  Extremities: warm/dry, no edema  Skin: no rashes or lesions  Chest tube drainage about 120 cc Chest x-ray 2/10 independently reviewed shows small left pneumothorax especially apical and lung mets as prior  Resolved Hospital Problem list     Assessment & Plan:   Recurrent left secondary pneumothorax -Place pigtail to suction for 24 hours, repeat chest x-ray in a.m., if lung is expanded, then proceed with talc pleurodesis before removing tube  Left lower extremity Liposarcoma  with metastatic disease to the lung, LLL necrotic mass with recurrent hydropneumothorax Diagnosed 2020, follows with [place].  S/p 7 cycles gemcitabine and docetaxel with progression.  Oncology felt that further chemotherapy would not be helpful.   Plan to have Guardant 360 mutation testing at [facility] or [facility].    Hemoptysis -related to lung mets. DC subcu  heparin     Goals of Care:  Last date of multidisciplinary goals of care discussion:per primary Family and staff present:  Summary of discussion:  Follow up goals of care discussion due:  Code Status: Full code, pt states he wants to pursue further intervention as needed, continue aggressive care  Labs   CBC: Recent Labs  Lab 02/20/20 1811 02/21/20 0010 02/22/20 0222  WBC 8.5 7.6 6.1  NEUTROABS 6.3  --   --   HGB 14.1 12.5* 11.0*  HCT 43.3 37.7* 34.9*  MCV 98.2 97.7 97.8  PLT 415* 365 130    Basic Metabolic Panel: Recent Labs  Lab 02/20/20 1811 02/21/20 0010 02/22/20 0222  NA 137 139 138  K 3.8 3.7 3.8  CL 106 107 107  CO2 21* 23 23  GLUCOSE 99 84 101*  BUN 9 9 9   CREATININE 0.75 0.69 0.68  CALCIUM 9.2 8.7* 8.4*   GFR: Estimated Creatinine Clearance: 112.5 mL/min (by C-G formula based on SCr of 0.68 mg/dL). Recent Labs  Lab 02/20/20 1811 02/21/20 0010 02/22/20 0222  WBC 8.5 7.6 6.1    Liver Function Tests: Recent Labs  Lab 02/20/20 1811 02/21/20 0010  AST 43* 40  ALT 32 28  ALKPHOS 133* 118  BILITOT 0.8 0.9  PROT 7.0 6.1*  ALBUMIN 3.4* 2.9*   No results for input(s): LIPASE, AMYLASE in the last 168 hours. No results for input(s): AMMONIA in the last 168 hours.    Kara Mead MD. [facility]. [facility] Pager : [phone]  If no response to pager , please call [phone] until 7 pm After 7:00 pm call Elink  [phone]   02/22/2020

## 2020-02-22 NOTE — Consult Note (Signed)
   [facility] Inpatient Consult   02/22/2020  Michaelpaul Apo Gens 12/05/1960 [id number]  [place] [facility] [[facility]] Patient: [facility] [place] Medicaid UHC noted   Patient screened for hospitalization with noted high risk score for unplanned readmission risk and with less than 7 days readmission hospitalization.  Reviewed to assess for potential [place] service needs for post hospital transition.    Plan:  Continue to follow progress and disposition to assess for post hospital care management needs.  No current community follow up needs assessed at this time.  For questions contact:   Natividad Brood, RN BSN [facility] Liaison  [phone] business mobile phone Toll free office [phone]  Fax number: [phone] [email or web address]

## 2020-02-22 NOTE — Plan of Care (Signed)

## 2020-02-23 ENCOUNTER — Inpatient Hospital Stay (HOSPITAL_COMMUNITY): Payer: 59

## 2020-02-23 DIAGNOSIS — C7802 Secondary malignant neoplasm of left lung: Principal | ICD-10-CM

## 2020-02-23 DIAGNOSIS — J9312 Secondary spontaneous pneumothorax: Secondary | ICD-10-CM | POA: Diagnosis not present

## 2020-02-23 DIAGNOSIS — C499 Malignant neoplasm of connective and soft tissue, unspecified: Secondary | ICD-10-CM

## 2020-02-23 DIAGNOSIS — Z9689 Presence of other specified functional implants: Secondary | ICD-10-CM

## 2020-02-23 DIAGNOSIS — J9383 Other pneumothorax: Secondary | ICD-10-CM | POA: Diagnosis not present

## 2020-02-23 DIAGNOSIS — E860 Dehydration: Secondary | ICD-10-CM | POA: Diagnosis not present

## 2020-02-23 DIAGNOSIS — R1319 Other dysphagia: Secondary | ICD-10-CM | POA: Diagnosis not present

## 2020-02-23 LAB — CBC
HCT: 32.9 % — ABNORMAL LOW (ref 39.0–52.0)
Hemoglobin: 11 g/dL — ABNORMAL LOW (ref 13.0–17.0)
MCH: 32.4 pg (ref 26.0–34.0)
MCHC: 33.4 g/dL (ref 30.0–36.0)
MCV: 96.8 fL (ref 80.0–100.0)
Platelets: 297 10*3/uL (ref 150–400)
RBC: 3.4 MIL/uL — ABNORMAL LOW (ref 4.22–5.81)
RDW: 16.4 % — ABNORMAL HIGH (ref 11.5–15.5)
WBC: 5.5 10*3/uL (ref 4.0–10.5)
nRBC: 0 % (ref 0.0–0.2)

## 2020-02-23 LAB — BASIC METABOLIC PANEL
Anion gap: 7 (ref 5–15)
BUN: 9 mg/dL (ref 6–20)
CO2: 24 mmol/L (ref 22–32)
Calcium: 8.3 mg/dL — ABNORMAL LOW (ref 8.9–10.3)
Chloride: 106 mmol/L (ref 98–111)
Creatinine, Ser: 0.6 mg/dL — ABNORMAL LOW (ref 0.61–1.24)
GFR, Estimated: >60 mL/min (ref >60)
Glucose, Bld: 107 mg/dL — ABNORMAL HIGH (ref 70–99)
Potassium: 3.5 mmol/L (ref 3.5–5.1)
Sodium: 137 mmol/L (ref 135–145)

## 2020-02-23 MED ORDER — SENNOSIDES-DOCUSATE SODIUM 8.6-50 MG PO TABS
1.0000 | ORAL_TABLET | Freq: Every day | ORAL | Status: DC
  Administered 2020-02-23 – 2020-02-28 (×2): 1 via ORAL
  Filled 2020-02-23 (×2): qty 1

## 2020-02-23 MED ORDER — OXYCODONE HCL 5 MG PO TABS: 10.0000 mg | ORAL_TABLET | Freq: Three times a day (TID) | ORAL | Status: DC | PRN

## 2020-02-23 MED ORDER — POLYETHYLENE GLYCOL 3350 17 G PO PACK
17.0000 g | PACK | Freq: Every day | ORAL | Status: DC
  Administered 2020-02-23 – 2020-02-25 (×3): 17 g via ORAL
  Filled 2020-02-23 (×5): qty 1

## 2020-02-23 MED ORDER — OXYCODONE HCL 5 MG PO TABS
10.0000 mg | ORAL_TABLET | Freq: Four times a day (QID) | ORAL | Status: DC | PRN
  Administered 2020-02-23 – 2020-02-29 (×11): 10 mg via ORAL
  Filled 2020-02-23 (×11): qty 2

## 2020-02-23 NOTE — Progress Notes (Signed)
NAME:  Leonard [name], MRN:  [id number], DOB:  06/22/1960, LOS: 3 ADMISSION DATE:  02/20/2020, CONSULTATION DATE:  02/23/20 REFERRING MD:  Tana Coast, CHIEF COMPLAINT:  Shortness of breath   Brief History:  60 y.o. M with PMH of metastatic Liposarcoma s/p 7 cycles chemotherapy now on palliation therapy with recurrent LLL hydropneumothorax.      History of Present Illness:  60 y.o. M with PMH of metastatic Liposarcoma s/p 7 cycles chemotherapy now on palliation therapy with recurrent LLL hydropneumothorax.   Hospitalized 1/26-2/4 requiring pigtail chest tube and then large bore chest tube.  presented with left pneumothorax 1/26, left pigtail placed, air leak resolved and pigtail was removed on 28th with collapse of left lung requiring placement of large bore chest tube.  Air leak again resolved, this tube was clamped for 3 days prior to removal.   Pt reports he was feeling fairly well after discharge, coughed hard the morning of 2/8 and felt worsening shortness of breath and had some drainage from the site of prior chest tube.  He saw his oncologist who did outpatient CXR showing re-accumulation of pneumothorax, so was sent to the ED.   He was been stable on room air.  CT surgery consulted by ED and did not think that pt was a surgical candidate and recommended admission for observation.    CXR on 2/9 with stable pneumothorax without any tension component, pt does feel slightly worse this morning, but remains on room air.   PCCM consulted for possible repeat chest tube vs. Pleurx.   Past Medical History:   has a past medical history of Cancer ([place]) and Port-A-Cath in place (05/19/2019).  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 Multiple mass lesions throughout the lungs again noted, largest left upper lobe consistent with known neoplasm. Atelectasis right mid lung. No new opacity evident compared to 1 day prior. Stable cardiac silhouette.   Micro Data:  2/8 Covid.19, flu>>negative   Antimicrobials:    Interim History / Subjective:  No complaints at this time   Objective   Blood pressure 115/77, pulse 88, temperature 98.4 F (36.9 C), resp. rate 18, height 5\' 10"  (1.778 m), weight 93 kg, SpO2 93 %.        Intake/Output Summary (Last 24 hours) at 02/23/2020 1056 Last data filed at 02/23/2020 0805 Gross per 24 hour  Intake 460 ml  Output 4415 ml  Net -3955 ml   Filed Weights   02/20/20 1735  Weight: 93 kg   General: Well-nourished well-developed male no acute distress HEENT: MM pink/moist no JVD or lymphadenopathy is appreciated Neuro: Grossly intact without focal defect CV: Heart sounds are regular PULM: Decreased breath sounds at bases left chest tube without air leak with scant drainage  GI: soft, bsx4 active  GU: Voids Extremities: warm/dry,  edema  Skin: no rashes or lesions   No documented chest tube drainage on 02/22/2018 Chest x-ray 02/23/2020 small pneumothorax chest tube in adequate position  Resolved Hospital Problem list     Assessment & Plan:   Recurrent left secondary pneumothorax Pigtail catheter currently in place Small trace pneumothorax is noted Questionable timing of talc pleurodesis Chest tube does not demonstrate air leak at this time  Left lower extremity Liposarcoma with metastatic disease  to the lung, LLL necrotic mass with recurrent hydropneumothorax Diagnosed 2020, follows with [place].  S/p 7 cycles gemcitabine and docetaxel with progression.  Oncology felt that further chemotherapy would not be helpful.   Plan to have Guardant 360 mutation testing at [facility] or [facility].    Hemoptysis -related to lung mets. Discontinue  anticoagulants     Goals of Care:  Last date of multidisciplinary goals of care discussion:per primary Family and staff present:  Summary of discussion:  Follow up goals of care discussion due:  Code Status: Full code, pt states he wants to pursue further intervention as needed, continue aggressive care  Labs   CBC: Recent Labs  Lab 02/20/20 1811 02/21/20 0010 02/22/20 0222 02/23/20 0233  WBC 8.5 7.6 6.1 5.5  NEUTROABS 6.3  --   --   --   HGB 14.1 12.5* 11.0* 11.0*  HCT 43.3 37.7* 34.9* 32.9*  MCV 98.2 97.7 97.8 96.8  PLT 415* 365 315 211    Basic Metabolic Panel: Recent Labs  Lab 02/20/20 1811 02/21/20 0010 02/22/20 0222 02/23/20 0233  NA 137 139 138 137  K 3.8 3.7 3.8 3.5  CL 106 107 107 106  CO2 21* 23 23 24   GLUCOSE 99 84 101* 107*  BUN 9 9 9 9   CREATININE 0.75 0.69 0.68 0.60*  CALCIUM 9.2 8.7* 8.4* 8.3*   GFR: Estimated Creatinine Clearance: 112.5 mL/min (A) (by C-G formula based on SCr of 0.6 mg/dL (L)). Recent Labs  Lab 02/20/20 1811 02/21/20 0010 02/22/20 0222 02/23/20 0233  WBC 8.5 7.6 6.1 5.5    Liver Function Tests: Recent Labs  Lab 02/20/20 1811 02/21/20 0010  AST 43* 40  ALT 32 28  ALKPHOS 133* 118  BILITOT 0.8 0.9  PROT 7.0 6.1*  ALBUMIN 3.4* 2.9*   No results for input(s): LIPASE, AMYLASE in the last 168 hours. No results for input(s): AMMONIA in the last 168 hours.    [name] ACNP Acute Care Nurse Practitioner [place] Please consult Amion 02/23/2020, 10:56 AM

## 2020-02-23 NOTE — Progress Notes (Signed)
[facility]                                                                              Patient Demographics  Leonard [name], is a 60 y.o. male, DOB - 1960-03-15, BDZ:[id number]  Admit date - 02/20/2020   Admitting Physician Elwyn Reach, MD  Outpatient Primary MD for the patient is Celene Squibb, MD  Outpatient specialists:   LOS - 3  days   Medical records reviewed and are as summarized below:    Chief Complaint  Patient presents with  . 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  At that time he had more than 50% left-sided pneumothorax that was spontaneous.  Patient apparently had been doing fine until the morning of admission, when he had a coughing spell, started feeling more short of breath and pain on the left side of the chest.  Patient went to see his oncologist, x-ray showed reaccumulation of pneumothorax.  Patient had hydropneumothorax associated with the necrotic malignant mass. TCTS was consulted by EDP, recommended admission for observation. Chest x-ray showed persistent bilateral airspace opacity in addition to a moderate-sized left sided hydropneumothorax.  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  Oncology felt further chemotherapy would not be helpful due to progression of the disease. -high risk of  readmissions, recurrent hydropneumothorax, metastatic malignancy.  Palliative care was consulted during previous admission, not seen on 2/1, recommended outpatient palliative to follow    Esophageal dysphagia -No issues, currently on regular diet     Dehydration -Placed on IV fluid hydration  Anxiety, depression -Stable, continue Lexapro, Xanax   Code Status: Full CODE STATUS DVT Prophylaxis:     Level of Care: Level of care: Telemetry Surgical Family Communication: Discussed all imaging results, lab results, explained to the patient   Disposition Plan:     Status is: Inpatient  Remains inpatient appropriate because:Inpatient level of care appropriate due to severity of illness   Dispo: The patient is from: Home              Anticipated d/c is to: Home              Anticipated d/c date is: > 3 days              Patient currently is not medically stable to d/c.  Currently has chest tube   Difficult to place patient No   Time Spent in minutes   35 minutes  Procedures:  Pigtail catheter  placement on 02/21/20  Consultants:   TCTS PCCM  Antimicrobials:   Anti-infectives (From admission, onward)   None         Medications  Scheduled Meds: . ALPRAZolam  0.25 mg Oral QHS  . benzonatate  200 mg Oral TID  . Chlorhexidine Gluconate Cloth  6 each Topical Daily  . escitalopram  10 mg Oral QHS  . gabapentin  300 mg Oral TID  . lidocaine-EPINEPHrine  10 mL Infiltration Once  . sodium chloride flush  10 mL Intracatheter Q8H  . zolpidem  10 mg Oral QHS   Continuous Infusions: . sodium chloride 75 mL/hr at 02/23/20 0247   PRN Meds:.ALPRAZolam, HYDROcodone-homatropine, HYDROmorphone (DILAUDID) injection, ondansetron **OR** ondansetron (ZOFRAN) IV, oxyCODONE, pantoprazole, prochlorperazine      Subjective:   Adrian Gordan was seen and examined today.  Stable, + chest tube, pain 5/10, better controlled with current regimen.   Patient denies  abdominal pain, N/V/D/C,  new weakness, numbess, tingling.  Wheezing improved  Objective:   Vitals:   02/22/20 1347 02/22/20 1600 02/22/20 2148 02/23/20 0452  BP: 125/77 127/84 134/80 115/77  Pulse: 88 88 84 88  Resp: 16 20 18 18   Temp: 98.1 F (36.7 C) 98.2 F (36.8 C) 98.1 F (36.7 C) 98.4 F (36.9 C)  TempSrc:  Oral    SpO2: 94% 95% 95% 93%  Weight:      Height:        Intake/Output Summary (Last 24 hours) at 02/23/2020 1229 Last data filed at 02/23/2020 0805 Gross per 24 hour  Intake 310 ml  Output 3715 ml  Net -3405 ml     Wt Readings from Last 3 Encounters:  02/20/20 93 kg  02/20/20 93 kg  02/16/20 94.4 kg   Physical Exam  General: Alert and oriented x 3, NAD  Cardiovascular: S1 S2 clear, RRR. No pedal edema b/l  Respiratory: Decreased breath sounds at the bases, no wheezing today, chest tube with no air leak  Gastrointestinal: Soft, nontender, nondistended, NBS  Ext: no pedal edema bilaterally  Neuro: no new deficits  Musculoskeletal: No cyanosis, clubbing  Skin: No rashes  Psych: Normal affect and demeanor, alert and oriented x3       Data Reviewed:  I have personally reviewed following labs and imaging studies  Micro Results Recent Results (from the past 240 hour(s))  Resp Panel by RT-PCR (Flu A&B, Covid) Nasopharyngeal Swab     Status: None   Collection Time: 02/20/20  6:30 PM   Specimen: Nasopharyngeal Swab; Nasopharyngeal(NP) swabs in vial transport medium  Result Value Ref Range Status   SARS Coronavirus 2 by RT PCR NEGATIVE NEGATIVE Final    Comment: (NOTE) SARS-CoV-2 target nucleic acids are NOT DETECTED.  The SARS-CoV-2 RNA is generally detectable in upper respiratory specimens during the acute phase of infection. The lowest concentration of SARS-CoV-2 viral copies this assay can detect is 138 copies/mL. A negative result does not preclude SARS-Cov-2 infection and should not be used as the sole basis for treatment or other patient management decisions. A  negative result may occur with  improper specimen collection/handling, submission of specimen other than nasopharyngeal swab, presence of viral mutation(s) within the areas targeted by this assay, and inadequate number of viral copies(<138 copies/mL). A negative result must be combined with clinical observations, patient history, and epidemiological information. The expected result is Negative.  Fact Sheet for Patients:  EntrepreneurPulse.com.au  Fact Sheet for Healthcare Providers:  IncredibleEmployment.be  This test is no t yet approved  or cleared by the [place] and  has been authorized for detection and/or diagnosis of SARS-CoV-2 by FDA under an Emergency Use Authorization (EUA). This EUA will remain  in effect (meaning this test can be used) for the duration of the COVID-19 declaration under Section 564(b)(1) of the Act, 21 U.S.C.section 360bbb-3(b)(1), unless the authorization is terminated  or revoked sooner.       Influenza A by PCR NEGATIVE NEGATIVE Final   Influenza B by PCR NEGATIVE NEGATIVE Final    Comment: (NOTE) The Xpert Xpress SARS-CoV-2/FLU/RSV plus assay is intended as an aid in the diagnosis of influenza from Nasopharyngeal swab specimens and should not be used as a sole basis for treatment. Nasal washings and aspirates are unacceptable for Xpert Xpress SARS-CoV-2/FLU/RSV testing.  Fact Sheet for Patients: EntrepreneurPulse.com.au  Fact Sheet for Healthcare Providers: IncredibleEmployment.be  This test is not yet approved or cleared by the [place] FDA and has been authorized for detection and/or diagnosis of SARS-CoV-2 by FDA under an Emergency Use Authorization (EUA). This EUA will remain in effect (meaning this test can be used) for the duration of the COVID-19 declaration under Section 564(b)(1) of the Act, 21 U.S.C. section 360bbb-3(b)(1), unless the authorization  is terminated or revoked.  Performed at [facility], [place] [address], [place], [place] [address]     Radiology Reports DG Chest 2 View  Result Date: 02/20/2020 CLINICAL DATA:  Metastatic sarcoma.  Follow-up pneumothorax EXAM: CHEST - 2 VIEW COMPARISON:  02/15/2020 FINDINGS: Progression of left pneumothorax now approximately 30-40%. Small amount of pleural fluid in the left lung base. Gas in the chest wall on the left is unchanged. Multiple bilateral lung nodules most compatible with metastatic disease, unchanged. Port-A-Cath tip SVC unchanged. Small right pleural effusion. IMPRESSION: Interval progression of left pneumothorax approximately 30-40%. These results were called by telephone at the time of interpretation on 02/20/2020 at 3:53 pm to provider [facility] , who verbally acknowledged these results. Electronically Signed   By: Franchot Gallo M.D.   On: 02/20/2020 15:53   DG Chest 2 View  Result Date: 02/15/2020 CLINICAL DATA:  Pneumothorax, recent chest tube removal, history of lung cancer EXAM: CHEST - 2 VIEW COMPARISON:  Radiograph 02/14/2020, CT 02/06/2020 FINDINGS: Accessed right IJ approach Port-A-Cath tip terminates in the lower SVC. Telemetry leads and support devices overlie the chest. Cholecystectomy clips in the right upper quadrant. Persistent subcutaneous emphysema extending across the left chest wall. Suspect small residual left apical visceral pleural line with additional increasing lucency along the left heart and mediastinal borders which could reflect a pneumomediastinum or less likely medial pneumothorax. Small bilateral effusions. Persistent heterogeneous opacities are again seen in the mid to lower lungs with additional bilateral pulmonary masses, largest in the left lung apex. No acute or worrisome osseous abnormality. IMPRESSION: 1. Suspect small residual left apical visceral pleural line/apical pneumothorax. Additional increasing lucency along the left heart and  mediastinal borders which could reflect a pneumomediastinum or less likely medial pneumothorax component. Small bilateral effusions. 2. Extensive subcutaneous emphysema remains of the left chest wall. 3. Persistent heterogeneous opacities in the mid to lower lungs, favor edema and atelectasis. 4. Redemonstrated pulmonary masses, largest in the left apex. Electronically Signed   By: Lovena Le M.D.   On: 02/15/2020 06:46   DG Chest 2 View  Result Date: 02/14/2020 CLINICAL DATA:  Pneumothorax EXAM: CHEST - 2 VIEW COMPARISON:  Chest x-ray 02/13/2020 FINDINGS: Right chest wall Port-A-Cath and left chest tube in  stable appropriate position. The heart size and mediastinal contours are within normal limits. Persistent small apical left pneumothorax. Persistent bilateral pulmonary masses again noted. Possible trace right pleural effusion. No definite left pleural effusion. No acute osseous abnormality. IMPRESSION: 1. Grossly stable small left apical pneumothorax. 2. Trace right pleural effusion. 3. Persistent bilateral pulmonary masses. 4. Lines and tubes in stable position. Electronically Signed   By: Iven Finn M.D.   On: 02/14/2020 06:54   DG Chest 2 View  Result Date: 02/13/2020 CLINICAL DATA:  Chest tube. EXAM: CHEST - 2 VIEW COMPARISON:  02/12/2020. FINDINGS: Left chest tube in stable position. Left pneumothorax improved from prior exam with tiny residual left apical pneumothorax. Persistent bilateral pulmonary masses again noted. Small right pleural effusion. Heart size stable. Degenerative change thoracic spine. IMPRESSION: 1. Left chest tube in stable position. Left pneumothorax improved from prior exam with tiny residual left apical pneumothorax. 2. Persistent bilateral pulmonary masses again noted. Small right pleural effusion. Electronically Signed   By: Marcello Moores  Register   On: 02/13/2020 07:00   CT CHEST ABDOMEN PELVIS W CONTRAST  Result Date: 02/06/2020 CLINICAL DATA:  60 year old male with  history of liposarcoma in the left lower extremity. EXAM: CT CHEST, ABDOMEN, AND PELVIS WITH CONTRAST TECHNIQUE: Multidetector CT imaging of the chest, abdomen and pelvis was performed following the standard protocol during bolus administration of intravenous contrast. CONTRAST:  123mL OMNIPAQUE IOHEXOL 300 MG/ML  SOLN COMPARISON:  CT the chest, abdomen and pelvis 11/01/2019. FINDINGS: CT CHEST FINDINGS Cardiovascular: Heart size is normal. Trace amount of pericardial fluid and/or thickening, unlikely to be of hemodynamic significance at this time. No associated pericardial calcification. There is aortic atherosclerosis, as well as atherosclerosis of the great vessels of the mediastinum and the coronary arteries, including calcified atherosclerotic plaque in the left main, left anterior descending and right coronary arteries.Right internal jugular single-lumen porta cath with tip terminating at the superior cavoatrial junction. Mediastinum/Nodes: No pathologically enlarged mediastinal or hilar lymph nodes. Esophagus is unremarkable in appearance. No axillary lymphadenopathy. Lungs/Pleura: Multiple large pulmonary nodules and masses are again noted in the lungs bilaterally, generally increased in size compared to the prior examination. The largest of these is in the left upper lobe near the apex (axial image 17 of series 2) measuring 5.5 x 5.6 cm, significantly increased from 2.1 x 1.8 cm on the prior examination. This lesion is centrally low-attenuation, suggesting internal areas of necrosis. Small bilateral pleural effusions. New large left pneumothorax with considerable atelectasis in the left lung. Musculoskeletal: There are no aggressive appearing lytic or blastic lesions noted in the visualized portions of the skeleton. CT ABDOMEN PELVIS FINDINGS Hepatobiliary: No suspicious cystic or solid hepatic lesions. No intra or extrahepatic biliary ductal dilatation. Status post cholecystectomy. Pancreas: No pancreatic  mass. No pancreatic ductal dilatation. No pancreatic or peripancreatic fluid collections or inflammatory changes. Spleen: Unremarkable. Adrenals/Urinary Tract: In the upper pole the left kidney there is enhancing lesion measuring 1.7 cm in diameter, increased in size compared to the prior study (previously 1.2 cm), concerning for neoplasm. Exophytic 1.2 cm low-attenuation lesion in the lower pole the left kidney, compatible with a small simple cyst. Right kidney and bilateral adrenal glands are normal in appearance. No hydroureteronephrosis. Urinary bladder is normal in appearance. Stomach/Bowel: The appearance of the stomach is normal. There is no pathologic dilatation of small bowel or colon. Normal appendix. Vascular/Lymphatic: Aortic atherosclerosis, without evidence of aneurysm or dissection in the abdominal or pelvic vasculature. No lymphadenopathy noted in the abdomen  or pelvis. Reproductive: Prostate gland and seminal vesicles are unremarkable in appearance. Other: No significant volume of ascites.  No pneumoperitoneum. Musculoskeletal: There are no aggressive appearing lytic or blastic lesions noted in the visualized portions of the skeleton. IMPRESSION: 1. New large left hydropneumothorax. 2. Widespread metastatic disease to the lungs appears progressive compared to the prior study, as above. 3. Enlarging probably enhancing lesion in the upper pole the left kidney concerning for neoplasm, either metastatic or primary. 4. Small right pleural effusion. 5. Aortic atherosclerosis, in addition to left main and 2 vessel coronary artery disease. Please note that although the presence of coronary artery calcium documents the presence of coronary artery disease, the severity of this disease and any potential stenosis cannot be assessed on this non-gated CT examination. Assessment for potential risk factor modification, dietary therapy or pharmacologic therapy may be warranted, if clinically indicated. 6. Additional  incidental findings, as above. Critical Value/emergent results were called by telephone at the time of interpretation on 02/06/2020 at 11:35 am to provider [facility] , who verbally acknowledged these results. Electronically Signed   By: Vinnie Langton M.D.   On: 02/06/2020 11:36   DG Chest Port 1 View  Result Date: 02/23/2020 CLINICAL DATA:  60 year old male with metastatic sarcoma. Pneumothorax, chest tube. EXAM: PORTABLE CHEST 1 VIEW COMPARISON:  Portable chest 02/22/2020 and earlier. FINDINGS: Stable left chest tube. Small left pneumothorax has decreased, trace residual with pleural edge visible laterally. Small volume left chest wall gas has decreased. Underlying pulmonary metastatic disease, stable bilateral pulmonary masses. Stable cardiac size and mediastinal contours. Stable right chest power port. Visualized tracheal air column is within normal limits. No new pulmonary opacity. Stable visualized osseous structures. IMPRESSION: 1. Stable left chest tube. Decreased and now trace left pneumothorax. 2. Stable pulmonary metastatic disease. Electronically Signed   By: Genevie Ann M.D.   On: 02/23/2020 06:34   DG CHEST PORT 1 VIEW  Result Date: 02/22/2020 CLINICAL DATA:  Follow-up chest tube EXAM: PORTABLE CHEST 1 VIEW COMPARISON:  02/21/2020 FINDINGS: Cardiac shadow is enlarged but stable. Right chest wall port is again seen. Bilateral metastatic lesions are again identified and stable. Pigtail catheter is noted on the left. Small pneumothorax is again seen. No new focal abnormality is noted. IMPRESSION: Stable small left pneumothorax. Multifocal pulmonary metastatic disease as described. Electronically Signed   By: Inez Catalina M.D.   On: 02/22/2020 10:06   DG Chest Portable 1 View  Result Date: 02/21/2020 CLINICAL DATA:  Chest tube placement for pneumothorax EXAM: PORTABLE CHEST 1 VIEW COMPARISON:  February 21, 2020 study obtained earlier in the day FINDINGS: Chest tube now present on the left  with tip directed medially. Small residual lateral pneumothorax without tension component. Subcutaneous air noted on the left. Multiple mass lesions again noted throughout the lungs, largest left upper lobe region. Atelectatic change again noted in right mid and lower lung regions. Heart size and pulmonary vascularity are normal. No adenopathy evident. Port-A-Cath tip in superior vena cava. IMPRESSION: Left-sided pneumothorax much smaller after chest tube placement. No tension component. 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 Atelectatic change again noted on the right. Stable cardiac silhouette. Electronically Signed   By: Lowella Grip III M.D.   On: 02/21/2020 11:25   DG CHEST PORT 1 VIEW  Result Date: 02/21/2020 CLINICAL DATA:  Known pneumothorax. History of lower extremity liposarcoma EXAM: PORTABLE CHEST 1 VIEW COMPARISON:  February 20, 2020 chest radiograph and chest CT February 06, 2020 FINDINGS: Previously noted  pneumothorax on the left is unchanged without appreciable tension component. Subcutaneous air noted along the lateral left hemithorax. Multiple mass lesions throughout the lungs, largest in the left upper lobe, remain without change. Atelectatic change noted in right mid lung. No new opacity evident. Heart is upper normal in size with pulmonary vascularity within normal limits. No adenopathy evident. Port-A-Cath tip in superior vena cava. No bone lesions. IMPRESSION: Stable left-sided pneumothorax that tension component. Multiple mass lesions throughout the lungs again noted, largest left upper lobe consistent with known neoplasm. Atelectasis right mid lung. No new opacity evident compared to 1 day prior. Stable cardiac silhouette. Electronically Signed   By: Lowella Grip III M.D.   On: 02/21/2020 08:13   DG Chest Port 1 View  Result Date: 02/20/2020 CLINICAL DATA:  Pneumothorax follow-up EXAM: PORTABLE CHEST 1 VIEW COMPARISON:  February 20, 2020 FINDINGS: There is a  persistent unchanged left-sided pneumothorax. There is subcutaneous gas along the patient's left flank. There are airspace opacities involving the left upper lung zone and right lung base. There is a well-positioned right-sided Port-A-Cath. No definite right-sided pneumothorax. There is a small right-sided pleural effusion. IMPRESSION: No significant interval change. Persistent bilateral airspace opacities in addition to a moderate-sized left-sided pneumothorax. Electronically Signed   By: Constance Holster M.D.   On: 02/20/2020 18:47   DG Chest Port 1 View  Result Date: 02/12/2020 CLINICAL DATA:  60 year old male with history of pneumothorax status post chest tube placement. EXAM: PORTABLE CHEST 1 VIEW COMPARISON:  Chest x-ray 02/09/2020. FINDINGS: Right internal jugular single-lumen power porta cath with tip terminating at the superior cavoatrial junction. Left-sided chest tube in position with tip and side port projecting over the lower left hemithorax. Small persistent left pneumothorax, increased slightly in size compared to the prior study. No right-sided pneumothorax. Multiple pulmonary nodules and masses again noted, better demonstrated on prior chest CT 02/06/2020. No definite acute consolidative airspace disease. No pleural effusions. No evidence of pulmonary edema. Heart size is normal. Upper mediastinal contours are within normal limits. Extensive subcutaneous emphysema in the left chest wall again noted. IMPRESSION: 1. Support apparatus, as above. 2. Increased size of small left-sided pneumothorax. 3. Multiple pulmonary nodules and masses, similar to prior examinations, presumably reflective of metastatic disease. Electronically Signed   By: Vinnie Langton M.D.   On: 02/12/2020 08:16   DG CHEST PORT 1 VIEW  Result Date: 02/09/2020 CLINICAL DATA:  Follow-up hydropneumothorax. Status post chest tube removal. EXAM: PORTABLE CHEST 1 VIEW COMPARISON:  02/09/2020 FINDINGS: Right chest wall port a  catheter is noted with tip at the cavoatrial junction. The left-sided chest tube has been removed. Small left apical pneumothorax persists. Unchanged appearance of gas within the soft tissues of the left chest wall. Masslike opacities within the left upper lobe and right base are unchanged. IMPRESSION: Persistent small left apical pneumothorax status post left-sided chest tube removal. Electronically Signed   By: Kerby Moors M.D.   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 Recent left pneumothorax. EXAM: PORTABLE CHEST 1 VIEW COMPARISON:  Portable chest 02/08/2020 and earlier. FINDINGS: Portable AP upright view at 0816 hours. Left chest tube is in place although there may be side holes outside of the left pleural space (arrow). A small volume of left chest wall gas has increased. Small residual left pneumothorax. No mediastinal shift. Masslike bilateral pulmonary opacities compatible with known metastases. Stable cardiac size and mediastinal  contours. Visualized tracheal air column is within normal limits. Stable right chest porta cath. Negative visible bowel gas pattern. Stable visualized osseous structures. IMPRESSION: 1. Left chest tube in place although there may be tube side holes outside of the pleural space. A small volume of chest wall gas has increased. 2. Stable small residual pneumothorax. 3. Stable pulmonary metastases. Electronically Signed   By: Genevie Ann M.D.   On: 02/09/2020 08:25   DG CHEST PORT 1 VIEW  Result Date: 02/08/2020 CLINICAL DATA:  Hydropneumothorax, chest tube on LEFT EXAM: PORTABLE CHEST 1 VIEW COMPARISON:  Portable exam 1884 hours compared to 02/07/2020 FINDINGS: Pigtail RIGHT thoracostomy tube, partially withdrawn since previous exam with tip projecting at LEFT costal margin. Persistent small LEFT apex pneumothorax. Basilar LEFT pleural effusion. Normal heart size mediastinal contours. RIGHT  jugular Port-A-Cath stable tip projecting over SVC. Atelectasis versus infiltrate at RIGHT base. BILATERAL pulmonary nodules, largest a mass at LEFT apex. Bones demineralized. IMPRESSION: Persistent small LEFT hydropneumothorax. LEFT thoracostomy tube has been partially withdrawn with the pigtail now at LEFT costal margin. Electronically Signed   By: Lavonia Dana M.D.   On: 02/08/2020 15:11   DG Chest Portable 1 View  Result Date: 02/07/2020 CLINICAL DATA:  Follow-up left pneumothorax.  Lung carcinoma. EXAM: PORTABLE CHEST 1 VIEW COMPARISON:  Prior today FINDINGS: There has been placement of a left pleural pigtail catheter since previous study, with near complete resolution of left pneumothorax since prior exam. Multiple pulmonary nodules and masses are again seen in both lungs, largest in the left upper lobe and right lower lobe. Small right pleural effusion remains stable. Right-sided Port-A-Cath remains in appropriate position. IMPRESSION: Near complete resolution of left pneumothorax following left pleural catheter placement. Electronically Signed   By: Marlaine Hind M.D.   On: 02/07/2020 14:55   DG Chest Portable 1 View  Result Date: 02/07/2020 CLINICAL DATA:  Left pneumothorax. EXAM: PORTABLE CHEST 1 VIEW COMPARISON:  CT 02/06/2020. FINDINGS: Mediastinum hilar structures normal. Borderline cardiomegaly. Large left pneumothorax noted. Similar finding noted on prior CT. Prominent atelectasis left lung. Multiple prominent bilateral pulmonary masses again noted as noted on prior CT. No acute bony abnormality identified. Contrast in the colon. IMPRESSION: 1. Large left pneumothorax. Similar finding noted on prior CT. Prominent atelectasis left lung. 2. Multiple prominent bilateral pulmonary masses again noted as noted on prior CT. Electronically Signed   By: Marcello Moores  Register   On: 02/07/2020 13:50   DG Chest Port 1V same Day  Result Date: 02/09/2020 CLINICAL DATA:  Shortness of breath, history of  metastatic lung carcinoma EXAM: PORTABLE CHEST 1 VIEW COMPARISON:  Film from earlier in the same day. FINDINGS: Cardiac shadow is stable. Left-sided chest tube is again identified. Small left apical pneumothorax is again identified stable in appearance. Stable left upper lobe mass lesion is noted. Nodules are noted in the right lung as well as patchy atelectatic changes in the bases bilaterally. Considerable subcutaneous emphysema is noted. No bony abnormality is seen. Right chest wall port is noted. IMPRESSION: Stable left apical pneumothorax with chest tube in place. Changes of metastatic disease within the lungs. Electronically Signed   By: Inez Catalina M.D.   On: 02/09/2020 23:23   DG Chest Port 1V same Day  Result Date: 02/09/2020 CLINICAL DATA:  Pneumothorax.  Chest tube placement. EXAM: PORTABLE CHEST 1 VIEW COMPARISON:  February 09, 2020 FINDINGS: There has been interval placement of a left-sided chest tube. There is a small residual left-sided pneumothorax, improved from prior  study. The right-sided Port-A-Cath is well position. Bilateral airspace opacities are again noted, not substantially changed from prior study. There is subcutaneous emphysema along the patient's left flank, stable to improved from prior study. The heart size is unchanged. IMPRESSION: 1. Interval placement of a left-sided chest tube. 2. Small residual left-sided pneumothorax, improved from prior study. 3. Otherwise, no significant short interval change. Electronically Signed   By: Constance Holster M.D.   On: 02/09/2020 16:25   DG Chest Port 1V same Day  Result Date: 02/09/2020 CLINICAL DATA:  Chest tube removal EXAM: PORTABLE CHEST 1 VIEW COMPARISON:  02/09/2020 FINDINGS: Further increased size of the left pneumothorax, now noted laterally and at the left apex, approximately 15%. Right Port-A-Cath remains in place, unchanged. Airspace opacity at the right lung base is unchanged. Heart is borderline in size. Subcutaneous  emphysema throughout the left chest wall. IMPRESSION: Enlarging left pneumothorax, now approximately 15%. Otherwise no change. These results will be called to the ordering clinician or representative by the Radiologist Assistant, and communication documented in the PACS or [employer]. Electronically Signed   By: Rolm Baptise M.D.   On: 02/09/2020 15:33    Lab Data:  CBC: Recent Labs  Lab 02/20/20 1811 02/21/20 0010 02/22/20 0222 02/23/20 0233  WBC 8.5 7.6 6.1 5.5  NEUTROABS 6.3  --   --   --   HGB 14.1 12.5* 11.0* 11.0*  HCT 43.3 37.7* 34.9* 32.9*  MCV 98.2 97.7 97.8 96.8  PLT 415* 365 315 462   Basic Metabolic Panel: Recent Labs  Lab 02/20/20 1811 02/21/20 0010 02/22/20 0222 02/23/20 0233  NA 137 139 138 137  K 3.8 3.7 3.8 3.5  CL 106 107 107 106  CO2 21* 23 23 24   GLUCOSE 99 84 101* 107*  BUN 9 9 9 9   CREATININE 0.75 0.69 0.68 0.60*  CALCIUM 9.2 8.7* 8.4* 8.3*   GFR: Estimated Creatinine Clearance: 112.5 mL/min (A) (by C-G formula based on SCr of 0.6 mg/dL (L)). Liver Function Tests: Recent Labs  Lab 02/20/20 1811 02/21/20 0010  AST 43* 40  ALT 32 28  ALKPHOS 133* 118  BILITOT 0.8 0.9  PROT 7.0 6.1*  ALBUMIN 3.4* 2.9*   No results for input(s): LIPASE, AMYLASE in the last 168 hours. No results for input(s): AMMONIA in the last 168 hours. Coagulation Profile: Recent Labs  Lab 02/20/20 1811  INR 1.1   Cardiac Enzymes: No results for input(s): CKTOTAL, CKMB, CKMBINDEX, TROPONINI in the last 168 hours. BNP (last 3 results) No results for input(s): PROBNP in the last 8760 hours. HbA1C: No results for input(s): HGBA1C in the last 72 hours. CBG: No results for input(s): GLUCAP in the last 168 hours. Lipid Profile: No results for input(s): CHOL, HDL, LDLCALC, TRIG, CHOLHDL, LDLDIRECT in the last 72 hours. Thyroid Function Tests: No results for input(s): TSH, T4TOTAL, FREET4, T3FREE, THYROIDAB in the last 72 hours. Anemia Panel: No results for  input(s): VITAMINB12, FOLATE, FERRITIN, TIBC, IRON, RETICCTPCT in the last 72 hours. Urine analysis: No results found for: COLORURINE, APPEARANCEUR, LABSPEC, PHURINE, GLUCOSEU, HGBUR, BILIRUBINUR, KETONESUR, PROTEINUR, UROBILINOGEN, NITRITE, LEUKOCYTESUR   Ripudeep Rai M.D. [facility] 02/23/2020, 12:29 PM   Call night coverage person covering after 7pm

## 2020-02-24 ENCOUNTER — Inpatient Hospital Stay (HOSPITAL_COMMUNITY): Payer: 59

## 2020-02-24 DIAGNOSIS — E860 Dehydration: Secondary | ICD-10-CM | POA: Diagnosis not present

## 2020-02-24 DIAGNOSIS — Z9689 Presence of other specified functional implants: Secondary | ICD-10-CM | POA: Diagnosis not present

## 2020-02-24 DIAGNOSIS — J9312 Secondary spontaneous pneumothorax: Secondary | ICD-10-CM | POA: Diagnosis not present

## 2020-02-24 DIAGNOSIS — R1319 Other dysphagia: Secondary | ICD-10-CM | POA: Diagnosis not present

## 2020-02-24 DIAGNOSIS — J9383 Other pneumothorax: Secondary | ICD-10-CM | POA: Diagnosis not present

## 2020-02-24 LAB — CBC
HCT: 35.7 % — ABNORMAL LOW (ref 39.0–52.0)
Hemoglobin: 11.5 g/dL — ABNORMAL LOW (ref 13.0–17.0)
MCH: 31.3 pg (ref 26.0–34.0)
MCHC: 32.2 g/dL (ref 30.0–36.0)
MCV: 97.3 fL (ref 80.0–100.0)
Platelets: 287 10*3/uL (ref 150–400)
RBC: 3.67 MIL/uL — ABNORMAL LOW (ref 4.22–5.81)
RDW: 16.1 % — ABNORMAL HIGH (ref 11.5–15.5)
WBC: 6.4 10*3/uL (ref 4.0–10.5)
nRBC: 0 % (ref 0.0–0.2)

## 2020-02-24 LAB — BASIC METABOLIC PANEL
Anion gap: 8 (ref 5–15)
BUN: 8 mg/dL (ref 6–20)
CO2: 23 mmol/L (ref 22–32)
Calcium: 8.7 mg/dL — ABNORMAL LOW (ref 8.9–10.3)
Chloride: 107 mmol/L (ref 98–111)
Creatinine, Ser: 0.61 mg/dL (ref 0.61–1.24)
GFR, Estimated: >60 mL/min (ref >60)
Glucose, Bld: 93 mg/dL (ref 70–99)
Potassium: 3.8 mmol/L (ref 3.5–5.1)
Sodium: 138 mmol/L (ref 135–145)

## 2020-02-24 MED ORDER — TALC (STERITALC) POWDER FOR INTRAPLEURAL USE
4.0000 g | Freq: Once | INTRAVENOUS | Status: AC
  Administered 2020-02-24: 4 g via INTRAPLEURAL
  Filled 2020-02-24: qty 4

## 2020-02-24 MED ORDER — SODIUM CHLORIDE 0.9% FLUSH
10.0000 mL | Freq: Two times a day (BID) | INTRAVENOUS | Status: DC
  Administered 2020-02-24 – 2020-02-29 (×11): 10 mL

## 2020-02-24 MED ORDER — ACETAMINOPHEN 500 MG PO TABS
1000.0000 mg | ORAL_TABLET | Freq: Once | ORAL | Status: AC
  Administered 2020-02-24: 1000 mg via ORAL
  Filled 2020-02-24: qty 2

## 2020-02-24 MED ORDER — HYDROMORPHONE HCL 1 MG/ML IJ SOLN
1.5000 mg | INTRAMUSCULAR | Status: DC | PRN
  Administered 2020-02-24 – 2020-02-25 (×3): 1.5 mg via INTRAVENOUS
  Filled 2020-02-24 (×3): qty 2

## 2020-02-24 MED ORDER — HYDROMORPHONE HCL 1 MG/ML IJ SOLN
1.0000 mg | INTRAMUSCULAR | Status: AC | PRN
  Administered 2020-02-24 (×3): 1 mg via INTRAVENOUS
  Filled 2020-02-24 (×3): qty 1

## 2020-02-24 MED ORDER — LIDOCAINE HCL (PF) 1 % IJ SOLN
250.0000 mg | Freq: Once | INTRAMUSCULAR | Status: DC
  Filled 2020-02-24: qty 30

## 2020-02-24 MED ORDER — SODIUM CHLORIDE 0.9% FLUSH: 10.0000 mL | INTRAVENOUS | Status: DC | PRN

## 2020-02-24 NOTE — Procedures (Signed)
Chest Tube Talc Pleurodesis Note  Leonard [name]  [id number]  06/02/1960  Date:02/24/20  Time:2:17 PM   Provider Performing:Brooke Moshe Cipro, NP with Dr. Carlis Abbott  Procedure: Chemical Pleurodesis via Chest Tube (32560)  Indication(s) Induced scarring of pleural space, recurrent left pneumothorax   Consent Risks of the procedure as well as the alternatives and risks of each were explained to the patient and/or caregiver.  Consent for the procedure was obtained.   Anesthesia 250 mg of 1% Lidocaine instilled into left pigtail catheter prior to procedure.  Pre-medicated with dilaudid 1 mg IV with PRN doses q 10 minutes x 2  Time Out Verified patient identification, verified procedure, site/side was marked, verified correct patient position, special equipment/implants available, medications/allergies/relevant history reviewed, required imaging and test results available.   Sterile Technique Hand hygiene, gloves   Procedure Description Existing left pigtail pleural catheter was cleaned and accessed in sterile manner.  Talc 4g in 50cc saline slurry administered into pleural space.  The chest tube clamped and patient will rotate positions for 1 hour then chest tube will be placed to suction.   RN to open chest tube at 1510 and return to suction.    Complications/Tolerance Patient having SOB and significant pain after instillation of talc.  Ongoing prn dilaudid doses being admistered.    NRB for dsypnea.  Patient on bedside pulse ox and BP which has remained stable.  Can wean back to room air as pain subsides.   NO NSAIDS, steroids, or anti-inflammatory drugs.  Opiates/ tylenol for pain.   EBL None   Specimen(s) None     Kennieth Rad, ACNP [facility] 02/24/2020, 2:22 PM

## 2020-02-24 NOTE — Progress Notes (Addendum)
NAME:  Leonard [name], MRN:  [id number], DOB:  07/11/1960, LOS: 4 ADMISSION DATE:  02/20/2020, CONSULTATION DATE:  02/24/20 REFERRING MD:  Tana Coast, CHIEF COMPLAINT:  Shortness of breath   Brief History:  60 y.o. M with PMH of metastatic Liposarcoma s/p 7 cycles chemotherapy now on palliation therapy with recurrent LLL hydropneumothorax.      History of Present Illness:  60 y.o. M with PMH of metastatic Liposarcoma s/p 7 cycles chemotherapy now on palliation therapy with recurrent LLL hydropneumothorax.   Hospitalized 1/26-2/4 requiring pigtail chest tube and then large bore chest tube.  presented with left pneumothorax 1/26, left pigtail placed, air leak resolved and pigtail was removed on 28th with collapse of left lung requiring placement of large bore chest tube.  Air leak again resolved, this tube was clamped for 3 days prior to removal.   Pt reports he was feeling fairly well after discharge, coughed hard the morning of 2/8 and felt worsening shortness of breath and had some drainage from the site of prior chest tube.  He saw his oncologist who did outpatient CXR showing re-accumulation of pneumothorax, so was sent to the ED.   He was been stable on room air.  CT surgery consulted by ED and did not think that pt was a surgical candidate and recommended admission for observation.    CXR on 2/9 with stable pneumothorax without any tension component, pt does feel slightly worse this morning, but remains on room air.   PCCM consulted for possible repeat chest tube vs. Pleurx.   Past Medical History:   has a past medical history of Cancer ([facility]) and Port-A-Cath in place (05/19/2019).  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 Multiple mass lesions throughout the lungs again noted, largest left upper lobe consistent with known neoplasm. Atelectasis right mid lung. No new opacity evident compared to 1 day prior. Stable cardiac silhouette.   Micro Data:  2/8 Covid.19, flu>>negative   Antimicrobials:    Interim History / Subjective:  Agrees to talk pleurodesis   Objective   Blood pressure 105/76, pulse 83, temperature 98 F (36.7 C), resp. rate 19, height 5\' 10"  (1.778 m), weight 93 kg, SpO2 93 %.        Intake/Output Summary (Last 24 hours) at 02/24/2020 1056 Last data filed at 02/23/2020 1700 Gross per 24 hour  Intake 270 ml  Output 745 ml  Net -475 ml   Filed Weights   02/20/20 1735  Weight: 93 kg   General: Well-nourished well-developed male no acute distress  HEENT: MM pink/moist no JVD or lymphadenopathy Neuro: No focal defects CV: Heart sounds are regular PULM: Diminished left, left chest tube 20 cm no air leak and no pneumothorax per chest x-ray GI: soft, bsx4 active  GU: Voids Extremities: warm/dry,  edema  Skin: no rashes or lesions      Resolved Hospital Problem list     Assessment & Plan:   Recurrent left secondary pneumothorax Pigtail catheter is in place currently on suction at 20 cm Pneumothorax is resolved radiographically Weanable attempt talc pleurodesis today 02/24/2020. Talc ordered at 11:00 No overt air leak in chest tube      Left lower extremity Liposarcoma with metastatic disease to the lung, LLL necrotic mass with  recurrent hydropneumothorax Diagnosed 2020, follows with [facility].  S/p 7 cycles gemcitabine and docetaxel with progression.  Oncology felt that further chemotherapy would not be helpful.   Plan to have Guardant 360 mutation testing at [facility] or [facility].    Hemoptysis -related to lung mets. Currently off anticoagulants     Goals of Care:  Last date of multidisciplinary goals of care discussion:per primary Family and  staff present:  Summary of discussion:  Follow up goals of care discussion due:  Code Status: Full code, pt states he wants to pursue further intervention as needed, continue aggressive care  Labs   CBC: Recent Labs  Lab 02/20/20 1811 02/21/20 0010 02/22/20 0222 02/23/20 0233 02/24/20 0413  WBC 8.5 7.6 6.1 5.5 6.4  NEUTROABS 6.3  --   --   --   --   HGB 14.1 12.5* 11.0* 11.0* 11.5*  HCT 43.3 37.7* 34.9* 32.9* 35.7*  MCV 98.2 97.7 97.8 96.8 97.3  PLT 415* 365 315 297 007    Basic Metabolic Panel: Recent Labs  Lab 02/20/20 1811 02/21/20 0010 02/22/20 0222 02/23/20 0233 02/24/20 0413  NA 137 139 138 137 138  K 3.8 3.7 3.8 3.5 3.8  CL 106 107 107 106 107  CO2 21* 23 23 24 23   GLUCOSE 99 84 101* 107* 93  BUN 9 9 9 9 8   CREATININE 0.75 0.69 0.68 0.60* 0.61  CALCIUM 9.2 8.7* 8.4* 8.3* 8.7*   GFR: Estimated Creatinine Clearance: 112.5 mL/min (by C-G formula based on SCr of 0.61 mg/dL). Recent Labs  Lab 02/21/20 0010 02/22/20 0222 02/23/20 0233 02/24/20 0413  WBC 7.6 6.1 5.5 6.4    Liver Function Tests: Recent Labs  Lab 02/20/20 1811 02/21/20 0010  AST 43* 40  ALT 32 28  ALKPHOS 133* 118  BILITOT 0.8 0.9  PROT 7.0 6.1*  ALBUMIN 3.4* 2.9*   No results for input(s): LIPASE, AMYLASE in the last 168 hours. No results for input(s): AMMONIA in the last 168 hours.    Richardson Landry Reana Chacko ACNP Acute Care Nurse Practitioner [place] Please consult Amion 02/24/2020, 10:56 AM

## 2020-02-24 NOTE — Progress Notes (Addendum)
Uncontrolled pain after talc pleurodesis. 4mg  dilaudid given IV total in divided doses. I directly monitored him at bedside for 45 minutes until his pain was controlled. RN at bedside when I left- HR 94, saturating 98% on NRB, RR ~20, still waking up periodically and speaking in full sentences.  Increasing dilaudid to 1.5mg  Q3h PRN Giving 1mg  tylenol now. May need further increases in pain medication over the next 12-24 hours. NSAIDs should be avoided as they can decrease the efficacy of pleurodesis.  Julian Hy, DO 02/24/20 3:07 PM [facility]   I checked back on Leonard [name]. Still having pain, but tolerable. Saturating well, able to wake up and have a conversation before falling back asleep. Discussed with bedside RN. Would give oral pain meds before giving additional IV. She will call if pain becomes uncontrolled.  Julian Hy, DO 02/24/20 4:34 PM [facility]  From 7AM- 7PM if no response to pager, please call [phone]. After hours, 7PM- 7AM, please call Elink  [phone].

## 2020-02-24 NOTE — Progress Notes (Signed)
[facility]                                                                              Patient Demographics  Leonard [name], is a 60 y.o. male, DOB - [birth year], AJO:[id number]  Admit date - 02/20/2020   Admitting Physician Elwyn Reach, MD  Outpatient Primary MD for the patient is Celene Squibb, MD  Outpatient specialists:   LOS - 4  days   Medical records reviewed and are as summarized below:    Chief Complaint  Patient presents with  . 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  At that time he had more than 50% left-sided pneumothorax that was spontaneous.  Patient apparently had been doing fine until the morning of admission, when he had a coughing spell, started feeling more short of breath and pain on the left side of the chest.  Patient went to see his oncologist, x-ray showed reaccumulation of pneumothorax.  Patient had hydropneumothorax associated with the necrotic malignant mass. TCTS was consulted by EDP, recommended admission for observation. Chest x-ray showed persistent bilateral airspace opacity in addition to a moderate-sized left sided hydropneumothorax.  Assessment & Plan    Principal Problem:   Spontaneous hydropneumothorax, recurrent in the setting of metastatic disease to the lung, LLL malignant necrotic mass -Stable, O2 sats 93% on room air.   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  Oncology felt further chemotherapy would not be helpful due to progression of the disease. -high risk of  readmissions, recurrent hydropneumothorax, metastatic malignancy.  Palliative care was consulted during previous admission, not seen on 2/1, recommended outpatient palliative to follow    Esophageal dysphagia -No issues, currently on regular diet     Dehydration -Patient was placed on IV fluid hydration  Anxiety, depression -Stable, continue Lexapro, Xanax   Code Status: Full CODE STATUS DVT Prophylaxis: SCDs   Level of Care: Level of care: Telemetry Surgical Family Communication: Discussed all imaging results, lab results, explained to the patient   Disposition Plan:     Status is: Inpatient  Remains inpatient appropriate because:Inpatient level of care appropriate due to severity of illness   Dispo: The patient is from: Home              Anticipated d/c is to: Home              Anticipated d/c date is: > 3 days              Patient currently is not medically stable to d/c.  Currently has chest tube   Difficult to place patient No   Time Spent in minutes   35 minutes  Procedures:  Pigtail catheter placement on 02/21/20  Consultants:   TCTS PCCM  Antimicrobials:   Anti-infectives (From admission, onward)   None         Medications  Scheduled Meds: . ALPRAZolam  0.25 mg Oral QHS  . benzonatate  200 mg Oral TID  . Chlorhexidine Gluconate Cloth  6 each Topical Daily  . escitalopram  10 mg Oral QHS  . gabapentin  300 mg Oral TID  . lidocaine (PF)  250 mg Other Once  . polyethylene glycol  17 g Oral Daily  . senna-docusate  1 tablet Oral Q1400  . sodium chloride flush  10 mL Intracatheter Q8H  . sodium chloride flush  10-40 mL Intracatheter Q12H  . talc (STERITALC) slurry in NS  4 g Intrapleural Once  . zolpidem  10 mg Oral QHS   Continuous Infusions:  PRN Meds:.ALPRAZolam, HYDROcodone-homatropine, HYDROmorphone (DILAUDID) injection, HYDROmorphone (DILAUDID) injection, ondansetron **OR** ondansetron (ZOFRAN) IV, oxyCODONE, pantoprazole, prochlorperazine,  sodium chloride flush      Subjective:   Isael Circle was seen and examined today.  No acute complaints, still has chest tube.  Pain is controlled.   Patient denies  abdominal pain, N/V/D/C, new weakness, numbess, tingling.  No wheezing, fevers or chills. + cough  Objective:   Vitals:   02/23/20 0900 02/23/20 1735 02/23/20 2124 02/24/20 0551  BP:  117/77 116/73 105/76  Pulse:  78 83 83  Resp: 15 16 18 19   Temp:  98.3 F (36.8 C) 98.5 F (36.9 C) 98 F (36.7 C)  TempSrc:      SpO2:  94% 95% 93%  Weight:      Height:        Intake/Output Summary (Last 24 hours) at 02/24/2020 1335 Last data filed at 02/23/2020 1700 Gross per 24 hour  Intake --  Output 330 ml  Net -330 ml     Wt Readings from Last 3 Encounters:  02/20/20 93 kg  02/20/20 93 kg  02/16/20 94.4 kg   Physical Exam  General: Alert and oriented x 3, NAD  Cardiovascular: S1 S2 clear, RRR. No pedal edema b/l  Respiratory: dec BS at the bases, no wheezing, + chest tube  Gastrointestinal: Soft, nontender, nondistended, NBS  Ext: no pedal edema bilaterally  Neuro: no new deficits  Musculoskeletal: No cyanosis, clubbing  Skin: No rashes  Psych: Normal affect and demeanor, alert and oriented x3     Data Reviewed:  I have personally reviewed following labs and imaging studies  Micro Results Recent Results (from the past 240 hour(s))  Resp Panel by RT-PCR (Flu A&B, Covid) Nasopharyngeal Swab     Status: None   Collection Time: 02/20/20  6:30 PM   Specimen: Nasopharyngeal Swab; Nasopharyngeal(NP) swabs in vial transport medium  Result Value Ref Range Status   SARS Coronavirus 2 by RT PCR NEGATIVE NEGATIVE Final    Comment: (NOTE) SARS-CoV-2 target nucleic acids are NOT DETECTED.  The SARS-CoV-2 RNA is generally detectable in upper respiratory specimens during the acute phase of infection. The lowest concentration of SARS-CoV-2 viral copies this assay can detect is 138 copies/mL. A negative result  does not preclude SARS-Cov-2 infection and should not be used as the sole basis for treatment or other patient management decisions. A negative result may occur with  improper specimen collection/handling, submission of specimen other than nasopharyngeal swab, presence of viral mutation(s) within the areas targeted by this assay, and inadequate number of viral copies(<138 copies/mL). A negative result must be combined with clinical observations,  patient history, and epidemiological information. The expected result is Negative.  Fact Sheet for Patients:  EntrepreneurPulse.com.au  Fact Sheet for Healthcare Providers:  IncredibleEmployment.be  This test is no t yet approved or cleared by the [place] FDA and  has been authorized for detection and/or diagnosis of SARS-CoV-2 by FDA under an Emergency Use Authorization (EUA). This EUA will remain  in effect (meaning this test can be used) for the duration of the COVID-19 declaration under Section 564(b)(1) of the Act, 21 U.S.C.section 360bbb-3(b)(1), unless the authorization is terminated  or revoked sooner.       Influenza A by PCR NEGATIVE NEGATIVE Final   Influenza B by PCR NEGATIVE NEGATIVE Final    Comment: (NOTE) The Xpert Xpress SARS-CoV-2/FLU/RSV plus assay is intended as an aid in the diagnosis of influenza from Nasopharyngeal swab specimens and should not be used as a sole basis for treatment. Nasal washings and aspirates are unacceptable for Xpert Xpress SARS-CoV-2/FLU/RSV testing.  Fact Sheet for Patients: EntrepreneurPulse.com.au  Fact Sheet for Healthcare Providers: IncredibleEmployment.be  This test is not yet approved or cleared by the [place] FDA and has been authorized for detection and/or diagnosis of SARS-CoV-2 by FDA under an Emergency Use Authorization (EUA). This EUA will remain in effect (meaning this test can be used) for  the duration of the COVID-19 declaration under Section 564(b)(1) of the Act, 21 U.S.C. section 360bbb-3(b)(1), unless the authorization is terminated or revoked.  Performed at [facility], [place] [address], [place], [place] [address]     Radiology Reports DG Chest 2 View  Result Date: 02/20/2020 CLINICAL DATA:  Metastatic sarcoma.  Follow-up pneumothorax EXAM: CHEST - 2 VIEW COMPARISON:  02/15/2020 FINDINGS: Progression of left pneumothorax now approximately 30-40%. Small amount of pleural fluid in the left lung base. Gas in the chest wall on the left is unchanged. Multiple bilateral lung nodules most compatible with metastatic disease, unchanged. Port-A-Cath tip SVC unchanged. Small right pleural effusion. IMPRESSION: Interval progression of left pneumothorax approximately 30-40%. These results were called by telephone at the time of interpretation on 02/20/2020 at 3:53 pm to provider [facility] , who verbally acknowledged these results. Electronically Signed   By: Franchot Gallo M.D.   On: 02/20/2020 15:53   DG Chest 2 View  Result Date: 02/15/2020 CLINICAL DATA:  Pneumothorax, recent chest tube removal, history of lung cancer EXAM: CHEST - 2 VIEW COMPARISON:  Radiograph 02/14/2020, CT 02/06/2020 FINDINGS: Accessed right IJ approach Port-A-Cath tip terminates in the lower SVC. Telemetry leads and support devices overlie the chest. Cholecystectomy clips in the right upper quadrant. Persistent subcutaneous emphysema extending across the left chest wall. Suspect small residual left apical visceral pleural line with additional increasing lucency along the left heart and mediastinal borders which could reflect a pneumomediastinum or less likely medial pneumothorax. Small bilateral effusions. Persistent heterogeneous opacities are again seen in the mid to lower lungs with additional bilateral pulmonary masses, largest in the left lung apex. No acute or worrisome osseous abnormality.  IMPRESSION: 1. Suspect small residual left apical visceral pleural line/apical pneumothorax. Additional increasing lucency along the left heart and mediastinal borders which could reflect a pneumomediastinum or less likely medial pneumothorax component. Small bilateral effusions. 2. Extensive subcutaneous emphysema remains of the left chest wall. 3. Persistent heterogeneous opacities in the mid to lower lungs, favor edema and atelectasis. 4. Redemonstrated pulmonary masses, largest in the left apex. Electronically Signed   By: Lovena Le M.D.   On: 02/15/2020 06:46  DG Chest 2 View  Result Date: 02/14/2020 CLINICAL DATA:  Pneumothorax EXAM: CHEST - 2 VIEW COMPARISON:  Chest x-ray 02/13/2020 FINDINGS: Right chest wall Port-A-Cath and left chest tube in stable appropriate position. The heart size and mediastinal contours are within normal limits. Persistent small apical left pneumothorax. Persistent bilateral pulmonary masses again noted. Possible trace right pleural effusion. No definite left pleural effusion. No acute osseous abnormality. IMPRESSION: 1. Grossly stable small left apical pneumothorax. 2. Trace right pleural effusion. 3. Persistent bilateral pulmonary masses. 4. Lines and tubes in stable position. Electronically Signed   By: Iven Finn M.D.   On: 02/14/2020 06:54   DG Chest 2 View  Result Date: 02/13/2020 CLINICAL DATA:  Chest tube. EXAM: CHEST - 2 VIEW COMPARISON:  02/12/2020. FINDINGS: Left chest tube in stable position. Left pneumothorax improved from prior exam with tiny residual left apical pneumothorax. Persistent bilateral pulmonary masses again noted. Small right pleural effusion. Heart size stable. Degenerative change thoracic spine. IMPRESSION: 1. Left chest tube in stable position. Left pneumothorax improved from prior exam with tiny residual left apical pneumothorax. 2. Persistent bilateral pulmonary masses again noted. Small right pleural effusion. Electronically Signed   By:  Marcello Moores  Register   On: 02/13/2020 07:00   CT CHEST ABDOMEN PELVIS W CONTRAST  Result Date: 02/06/2020 CLINICAL DATA:  60 year old male with history of liposarcoma in the left lower extremity. EXAM: CT CHEST, ABDOMEN, AND PELVIS WITH CONTRAST TECHNIQUE: Multidetector CT imaging of the chest, abdomen and pelvis was performed following the standard protocol during bolus administration of intravenous contrast. CONTRAST:  144mL OMNIPAQUE IOHEXOL 300 MG/ML  SOLN COMPARISON:  CT the chest, abdomen and pelvis 11/01/2019. FINDINGS: CT CHEST FINDINGS Cardiovascular: Heart size is normal. Trace amount of pericardial fluid and/or thickening, unlikely to be of hemodynamic significance at this time. No associated pericardial calcification. There is aortic atherosclerosis, as well as atherosclerosis of the great vessels of the mediastinum and the coronary arteries, including calcified atherosclerotic plaque in the left main, left anterior descending and right coronary arteries.Right internal jugular single-lumen porta cath with tip terminating at the superior cavoatrial junction. Mediastinum/Nodes: No pathologically enlarged mediastinal or hilar lymph nodes. Esophagus is unremarkable in appearance. No axillary lymphadenopathy. Lungs/Pleura: Multiple large pulmonary nodules and masses are again noted in the lungs bilaterally, generally increased in size compared to the prior examination. The largest of these is in the left upper lobe near the apex (axial image 17 of series 2) measuring 5.5 x 5.6 cm, significantly increased from 2.1 x 1.8 cm on the prior examination. This lesion is centrally low-attenuation, suggesting internal areas of necrosis. Small bilateral pleural effusions. New large left pneumothorax with considerable atelectasis in the left lung. Musculoskeletal: There are no aggressive appearing lytic or blastic lesions noted in the visualized portions of the skeleton. CT ABDOMEN PELVIS FINDINGS Hepatobiliary: No  suspicious cystic or solid hepatic lesions. No intra or extrahepatic biliary ductal dilatation. Status post cholecystectomy. Pancreas: No pancreatic mass. No pancreatic ductal dilatation. No pancreatic or peripancreatic fluid collections or inflammatory changes. Spleen: Unremarkable. Adrenals/Urinary Tract: In the upper pole the left kidney there is enhancing lesion measuring 1.7 cm in diameter, increased in size compared to the prior study (previously 1.2 cm), concerning for neoplasm. Exophytic 1.2 cm low-attenuation lesion in the lower pole the left kidney, compatible with a small simple cyst. Right kidney and bilateral adrenal glands are normal in appearance. No hydroureteronephrosis. Urinary bladder is normal in appearance. Stomach/Bowel: The appearance of the stomach is normal. There  is no pathologic dilatation of small bowel or colon. Normal appendix. Vascular/Lymphatic: Aortic atherosclerosis, without evidence of aneurysm or dissection in the abdominal or pelvic vasculature. No lymphadenopathy noted in the abdomen or pelvis. Reproductive: Prostate gland and seminal vesicles are unremarkable in appearance. Other: No significant volume of ascites.  No pneumoperitoneum. Musculoskeletal: There are no aggressive appearing lytic or blastic lesions noted in the visualized portions of the skeleton. IMPRESSION: 1. New large left hydropneumothorax. 2. Widespread metastatic disease to the lungs appears progressive compared to the prior study, as above. 3. Enlarging probably enhancing lesion in the upper pole the left kidney concerning for neoplasm, either metastatic or primary. 4. Small right pleural effusion. 5. Aortic atherosclerosis, in addition to left main and 2 vessel coronary artery disease. Please note that although the presence of coronary artery calcium documents the presence of coronary artery disease, the severity of this disease and any potential stenosis cannot be assessed on this non-gated CT examination.  Assessment for potential risk factor modification, dietary therapy or pharmacologic therapy may be warranted, if clinically indicated. 6. Additional incidental findings, as above. Critical Value/emergent results were called by telephone at the time of interpretation on 02/06/2020 at 11:35 am to provider [facility] , who verbally acknowledged these results. Electronically Signed   By: Vinnie Langton M.D.   On: 02/06/2020 11:36   DG Chest Port 1 View  Result Date: 02/24/2020 CLINICAL DATA:  Liposarcoma. Chest tube in place for recent pneumothorax EXAM: PORTABLE CHEST 1 VIEW COMPARISON:  February 23, 2020 FINDINGS: Chest tube remains on the left, unchanged in position. There is subcutaneous air on the left is again noted. Port-A-Cath tip is in the superior vena cava near the cavoatrial junction. Currently there is no appreciable pneumothorax. Multiple mass lesions remain, largest in left upper lobe, consistent with metastatic foci. Atelectatic change noted in left base and right mid lung. Heart upper normal in size with pulmonary vascularity normal. No adenopathy appreciable by radiography. Degenerative change noted in each shoulder. IMPRESSION: Tube and catheter positions as described without appreciable pneumothorax. Pulmonary metastases again noted without change. There is left base and right midlung atelectatic change. No consolidation. Stable cardiac silhouette. Electronically Signed   By: Lowella Grip III M.D.   On: 02/24/2020 09:08   DG Chest Port 1 View  Result Date: 02/23/2020 CLINICAL DATA:  60 year old male with metastatic sarcoma. Pneumothorax, chest tube. EXAM: PORTABLE CHEST 1 VIEW COMPARISON:  Portable chest 02/22/2020 and earlier. FINDINGS: Stable left chest tube. Small left pneumothorax has decreased, trace residual with pleural edge visible laterally. Small volume left chest wall gas has decreased. Underlying pulmonary metastatic disease, stable bilateral pulmonary masses.  Stable cardiac size and mediastinal contours. Stable right chest power port. Visualized tracheal air column is within normal limits. No new pulmonary opacity. Stable visualized osseous structures. IMPRESSION: 1. Stable left chest tube. Decreased and now trace left pneumothorax. 2. Stable pulmonary metastatic disease. Electronically Signed   By: Genevie Ann M.D.   On: 02/23/2020 06:34   DG CHEST PORT 1 VIEW  Result Date: 02/22/2020 CLINICAL DATA:  Follow-up chest tube EXAM: PORTABLE CHEST 1 VIEW COMPARISON:  02/21/2020 FINDINGS: Cardiac shadow is enlarged but stable. Right chest wall port is again seen. Bilateral metastatic lesions are again identified and stable. Pigtail catheter is noted on the left. Small pneumothorax is again seen. No new focal abnormality is noted. IMPRESSION: Stable small left pneumothorax. Multifocal pulmonary metastatic disease as described. Electronically Signed   By: Linus Mako.D.  On: 02/22/2020 10:06   DG Chest Portable 1 View  Result Date: 02/21/2020 CLINICAL DATA:  Chest tube placement for pneumothorax EXAM: PORTABLE CHEST 1 VIEW COMPARISON:  February 21, 2020 study obtained earlier in the day FINDINGS: Chest tube now present on the left with tip directed medially. Small residual lateral pneumothorax without tension component. Subcutaneous air noted on the left. Multiple mass lesions again noted throughout the lungs, largest left upper lobe region. Atelectatic change again noted in right mid and lower lung regions. Heart size and pulmonary vascularity are normal. No adenopathy evident. Port-A-Cath tip in superior vena cava. IMPRESSION: Left-sided pneumothorax much smaller after chest tube placement. No tension component. 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 Atelectatic change again noted on the right. Stable cardiac silhouette. Electronically Signed   By: Lowella Grip III M.D.   On: 02/21/2020 11:25   DG CHEST PORT 1 VIEW  Result Date:  02/21/2020 CLINICAL DATA:  Known pneumothorax. History of lower extremity liposarcoma EXAM: PORTABLE CHEST 1 VIEW COMPARISON:  February 20, 2020 chest radiograph and chest CT February 06, 2020 FINDINGS: Previously noted pneumothorax on the left is unchanged without appreciable tension component. Subcutaneous air noted along the lateral left hemithorax. Multiple mass lesions throughout the lungs, largest in the left upper lobe, remain without change. Atelectatic change noted in right mid lung. No new opacity evident. Heart is upper normal in size with pulmonary vascularity within normal limits. No adenopathy evident. Port-A-Cath tip in superior vena cava. No bone lesions. IMPRESSION: Stable left-sided pneumothorax that tension component. Multiple mass lesions throughout the lungs again noted, largest left upper lobe consistent with known neoplasm. Atelectasis right mid lung. No new opacity evident compared to 1 day prior. Stable cardiac silhouette. Electronically Signed   By: Lowella Grip III M.D.   On: 02/21/2020 08:13   DG Chest Port 1 View  Result Date: 02/20/2020 CLINICAL DATA:  Pneumothorax follow-up EXAM: PORTABLE CHEST 1 VIEW COMPARISON:  February 20, 2020 FINDINGS: There is a persistent unchanged left-sided pneumothorax. There is subcutaneous gas along the patient's left flank. There are airspace opacities involving the left upper lung zone and right lung base. There is a well-positioned right-sided Port-A-Cath. No definite right-sided pneumothorax. There is a small right-sided pleural effusion. IMPRESSION: No significant interval change. Persistent bilateral airspace opacities in addition to a moderate-sized left-sided pneumothorax. Electronically Signed   By: Constance Holster M.D.   On: 02/20/2020 18:47   DG Chest Port 1 View  Result Date: 02/12/2020 CLINICAL DATA:  60 year old male with history of pneumothorax status post chest tube placement. EXAM: PORTABLE CHEST 1 VIEW COMPARISON:  Chest x-ray  02/09/2020. FINDINGS: Right internal jugular single-lumen power porta cath with tip terminating at the superior cavoatrial junction. Left-sided chest tube in position with tip and side port projecting over the lower left hemithorax. Small persistent left pneumothorax, increased slightly in size compared to the prior study. No right-sided pneumothorax. Multiple pulmonary nodules and masses again noted, better demonstrated on prior chest CT 02/06/2020. No definite acute consolidative airspace disease. No pleural effusions. No evidence of pulmonary edema. Heart size is normal. Upper mediastinal contours are within normal limits. Extensive subcutaneous emphysema in the left chest wall again noted. IMPRESSION: 1. Support apparatus, as above. 2. Increased size of small left-sided pneumothorax. 3. Multiple pulmonary nodules and masses, similar to prior examinations, presumably reflective of metastatic disease. Electronically Signed   By: Vinnie Langton M.D.   On: 02/12/2020 08:16   DG CHEST PORT 1 VIEW  Result  Date: 02/09/2020 CLINICAL DATA:  Follow-up hydropneumothorax. Status post chest tube removal. EXAM: PORTABLE CHEST 1 VIEW COMPARISON:  02/09/2020 FINDINGS: Right chest wall port a catheter is noted with tip at the cavoatrial junction. The left-sided chest tube has been removed. Small left apical pneumothorax persists. Unchanged appearance of gas within the soft tissues of the left chest wall. Masslike opacities within the left upper lobe and right base are unchanged. IMPRESSION: Persistent small left apical pneumothorax status post left-sided chest tube removal. Electronically Signed   By: Kerby Moors M.D.   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 Recent left pneumothorax. EXAM: PORTABLE CHEST 1 VIEW COMPARISON:  Portable chest 02/08/2020 and earlier. FINDINGS: Portable AP upright view at 0816 hours. Left chest tube is  in place although there may be side holes outside of the left pleural space (arrow). A small volume of left chest wall gas has increased. Small residual left pneumothorax. No mediastinal shift. Masslike bilateral pulmonary opacities compatible with known metastases. Stable cardiac size and mediastinal contours. Visualized tracheal air column is within normal limits. Stable right chest porta cath. Negative visible bowel gas pattern. Stable visualized osseous structures. IMPRESSION: 1. Left chest tube in place although there may be tube side holes outside of the pleural space. A small volume of chest wall gas has increased. 2. Stable small residual pneumothorax. 3. Stable pulmonary metastases. Electronically Signed   By: Genevie Ann M.D.   On: 02/09/2020 08:25   DG CHEST PORT 1 VIEW  Result Date: 02/08/2020 CLINICAL DATA:  Hydropneumothorax, chest tube on LEFT EXAM: PORTABLE CHEST 1 VIEW COMPARISON:  Portable exam 2637 hours compared to 02/07/2020 FINDINGS: Pigtail RIGHT thoracostomy tube, partially withdrawn since previous exam with tip projecting at LEFT costal margin. Persistent small LEFT apex pneumothorax. Basilar LEFT pleural effusion. Normal heart size mediastinal contours. RIGHT jugular Port-A-Cath stable tip projecting over SVC. Atelectasis versus infiltrate at RIGHT base. BILATERAL pulmonary nodules, largest a mass at LEFT apex. Bones demineralized. IMPRESSION: Persistent small LEFT hydropneumothorax. LEFT thoracostomy tube has been partially withdrawn with the pigtail now at LEFT costal margin. Electronically Signed   By: Lavonia Dana M.D.   On: 02/08/2020 15:11   DG Chest Portable 1 View  Result Date: 02/07/2020 CLINICAL DATA:  Follow-up left pneumothorax.  Lung carcinoma. EXAM: PORTABLE CHEST 1 VIEW COMPARISON:  Prior today FINDINGS: There has been placement of a left pleural pigtail catheter since previous study, with near complete resolution of left pneumothorax since prior exam. Multiple pulmonary  nodules and masses are again seen in both lungs, largest in the left upper lobe and right lower lobe. Small right pleural effusion remains stable. Right-sided Port-A-Cath remains in appropriate position. IMPRESSION: Near complete resolution of left pneumothorax following left pleural catheter placement. Electronically Signed   By: Marlaine Hind M.D.   On: 02/07/2020 14:55   DG Chest Portable 1 View  Result Date: 02/07/2020 CLINICAL DATA:  Left pneumothorax. EXAM: PORTABLE CHEST 1 VIEW COMPARISON:  CT 02/06/2020. FINDINGS: Mediastinum hilar structures normal. Borderline cardiomegaly. Large left pneumothorax noted. Similar finding noted on prior CT. Prominent atelectasis left lung. Multiple prominent bilateral pulmonary masses again noted as noted on prior CT. No acute bony abnormality identified. Contrast in the colon. IMPRESSION: 1. Large left pneumothorax. Similar finding noted on prior CT. Prominent atelectasis left lung. 2. Multiple prominent bilateral pulmonary masses again noted as noted on prior CT. Electronically Signed   By:  [place]   On: 02/07/2020 13:50   DG Chest Port 1V same Day  Result Date: 02/09/2020 CLINICAL DATA:  Shortness of breath, history of metastatic lung carcinoma EXAM: PORTABLE CHEST 1 VIEW COMPARISON:  Film from earlier in the same day. FINDINGS: Cardiac shadow is stable. Left-sided chest tube is again identified. Small left apical pneumothorax is again identified stable in appearance. Stable left upper lobe mass lesion is noted. Nodules are noted in the right lung as well as patchy atelectatic changes in the bases bilaterally. Considerable subcutaneous emphysema is noted. No bony abnormality is seen. Right chest wall port is noted. IMPRESSION: Stable left apical pneumothorax with chest tube in place. Changes of metastatic disease within the lungs. Electronically Signed   By: Inez Catalina M.D.   On: 02/09/2020 23:23   DG Chest Port 1V same Day  Result Date:  02/09/2020 CLINICAL DATA:  Pneumothorax.  Chest tube placement. EXAM: PORTABLE CHEST 1 VIEW COMPARISON:  February 09, 2020 FINDINGS: There has been interval placement of a left-sided chest tube. There is a small residual left-sided pneumothorax, improved from prior study. The right-sided Port-A-Cath is well position. Bilateral airspace opacities are again noted, not substantially changed from prior study. There is subcutaneous emphysema along the patient's left flank, stable to improved from prior study. The heart size is unchanged. IMPRESSION: 1. Interval placement of a left-sided chest tube. 2. Small residual left-sided pneumothorax, improved from prior study. 3. Otherwise, no significant short interval change. Electronically Signed   By: Constance Holster M.D.   On: 02/09/2020 16:25   DG Chest Port 1V same Day  Result Date: 02/09/2020 CLINICAL DATA:  Chest tube removal EXAM: PORTABLE CHEST 1 VIEW COMPARISON:  02/09/2020 FINDINGS: Further increased size of the left pneumothorax, now noted laterally and at the left apex, approximately 15%. Right Port-A-Cath remains in place, unchanged. Airspace opacity at the right lung base is unchanged. Heart is borderline in size. Subcutaneous emphysema throughout the left chest wall. IMPRESSION: Enlarging left pneumothorax, now approximately 15%. Otherwise no change. These results will be called to the ordering clinician or representative by the Radiologist Assistant, and communication documented in the PACS or [employer]. Electronically Signed   By: Rolm Baptise M.D.   On: 02/09/2020 15:33    Lab Data:  CBC: Recent Labs  Lab 02/20/20 1811 02/21/20 0010 02/22/20 0222 02/23/20 0233 02/24/20 0413  WBC 8.5 7.6 6.1 5.5 6.4  NEUTROABS 6.3  --   --   --   --   HGB 14.1 12.5* 11.0* 11.0* 11.5*  HCT 43.3 37.7* 34.9* 32.9* 35.7*  MCV 98.2 97.7 97.8 96.8 97.3  PLT 415* 365 315 297 174   Basic Metabolic Panel: Recent Labs  Lab 02/20/20 1811 02/21/20 0010  02/22/20 0222 02/23/20 0233 02/24/20 0413  NA 137 139 138 137 138  K 3.8 3.7 3.8 3.5 3.8  CL 106 107 107 106 107  CO2 21* 23 23 24 23   GLUCOSE 99 84 101* 107* 93  BUN 9 9 9 9 8   CREATININE 0.75 0.69 0.68 0.60* 0.61  CALCIUM 9.2 8.7* 8.4* 8.3* 8.7*   GFR: Estimated Creatinine Clearance: 112.5 mL/min (by C-G formula based on SCr of 0.61 mg/dL). Liver Function Tests: Recent Labs  Lab 02/20/20 1811 02/21/20 0010  AST 43* 40  ALT 32 28  ALKPHOS 133* 118  BILITOT 0.8 0.9  PROT 7.0 6.1*  ALBUMIN 3.4* 2.9*   No results for input(s): LIPASE, AMYLASE in the last 168 hours. No  results for input(s): AMMONIA in the last 168 hours. Coagulation Profile: Recent Labs  Lab 02/20/20 1811  INR 1.1   Cardiac Enzymes: No results for input(s): CKTOTAL, CKMB, CKMBINDEX, TROPONINI in the last 168 hours. BNP (last 3 results) No results for input(s): PROBNP in the last 8760 hours. HbA1C: No results for input(s): HGBA1C in the last 72 hours. CBG: No results for input(s): GLUCAP in the last 168 hours. Lipid Profile: No results for input(s): CHOL, HDL, LDLCALC, TRIG, CHOLHDL, LDLDIRECT in the last 72 hours. Thyroid Function Tests: No results for input(s): TSH, T4TOTAL, FREET4, T3FREE, THYROIDAB in the last 72 hours. Anemia Panel: No results for input(s): VITAMINB12, FOLATE, FERRITIN, TIBC, IRON, RETICCTPCT in the last 72 hours. Urine analysis: No results found for: COLORURINE, APPEARANCEUR, LABSPEC, PHURINE, GLUCOSEU, HGBUR, BILIRUBINUR, KETONESUR, PROTEINUR, UROBILINOGEN, NITRITE, LEUKOCYTESUR   Tierra Thoma M.D. [facility] 02/24/2020, 1:35 PM   Call night coverage person covering after 7pm

## 2020-02-25 ENCOUNTER — Inpatient Hospital Stay (HOSPITAL_COMMUNITY): Payer: 59

## 2020-02-25 DIAGNOSIS — J9312 Secondary spontaneous pneumothorax: Secondary | ICD-10-CM | POA: Diagnosis not present

## 2020-02-25 DIAGNOSIS — J9383 Other pneumothorax: Secondary | ICD-10-CM | POA: Diagnosis not present

## 2020-02-25 DIAGNOSIS — E860 Dehydration: Secondary | ICD-10-CM | POA: Diagnosis not present

## 2020-02-25 DIAGNOSIS — R0600 Dyspnea, unspecified: Secondary | ICD-10-CM

## 2020-02-25 DIAGNOSIS — Z9689 Presence of other specified functional implants: Secondary | ICD-10-CM | POA: Diagnosis not present

## 2020-02-25 DIAGNOSIS — R042 Hemoptysis: Secondary | ICD-10-CM | POA: Diagnosis not present

## 2020-02-25 LAB — MAGNESIUM: Magnesium: 2 mg/dL (ref 1.7–2.4)

## 2020-02-25 LAB — PHOSPHORUS: Phosphorus: 4.3 mg/dL (ref 2.5–4.6)

## 2020-02-25 LAB — BASIC METABOLIC PANEL
Anion gap: 8 (ref 5–15)
BUN: 12 mg/dL (ref 6–20)
CO2: 26 mmol/L (ref 22–32)
Calcium: 8.6 mg/dL — ABNORMAL LOW (ref 8.9–10.3)
Chloride: 103 mmol/L (ref 98–111)
Creatinine, Ser: 0.57 mg/dL — ABNORMAL LOW (ref 0.61–1.24)
GFR, Estimated: >60 mL/min (ref >60)
Glucose, Bld: 122 mg/dL — ABNORMAL HIGH (ref 70–99)
Potassium: 3.9 mmol/L (ref 3.5–5.1)
Sodium: 137 mmol/L (ref 135–145)

## 2020-02-25 LAB — CBC
HCT: 35.6 % — ABNORMAL LOW (ref 39.0–52.0)
Hemoglobin: 11.4 g/dL — ABNORMAL LOW (ref 13.0–17.0)
MCH: 31.7 pg (ref 26.0–34.0)
MCHC: 32 g/dL (ref 30.0–36.0)
MCV: 98.9 fL (ref 80.0–100.0)
Platelets: 261 10*3/uL (ref 150–400)
RBC: 3.6 MIL/uL — ABNORMAL LOW (ref 4.22–5.81)
RDW: 16.1 % — ABNORMAL HIGH (ref 11.5–15.5)
WBC: 8.3 10*3/uL (ref 4.0–10.5)
nRBC: 0 % (ref 0.0–0.2)

## 2020-02-25 MED ORDER — IPRATROPIUM-ALBUTEROL 0.5-2.5 (3) MG/3ML IN SOLN
3.0000 mL | Freq: Four times a day (QID) | RESPIRATORY_TRACT | Status: DC
  Administered 2020-02-25 – 2020-02-28 (×12): 3 mL via RESPIRATORY_TRACT
  Filled 2020-02-25 (×12): qty 3

## 2020-02-25 MED ORDER — ACETAMINOPHEN 500 MG PO TABS
1000.0000 mg | ORAL_TABLET | Freq: Three times a day (TID) | ORAL | Status: AC
  Administered 2020-02-25 – 2020-02-26 (×6): 1000 mg via ORAL
  Filled 2020-02-25 (×6): qty 2

## 2020-02-25 MED ORDER — IPRATROPIUM-ALBUTEROL 0.5-2.5 (3) MG/3ML IN SOLN
RESPIRATORY_TRACT | Status: AC
  Administered 2020-02-25: 3 mL via RESPIRATORY_TRACT
  Filled 2020-02-25: qty 3

## 2020-02-25 MED ORDER — HYDROMORPHONE HCL 1 MG/ML IJ SOLN: 2.0000 mg | INTRAMUSCULAR | Status: DC | PRN

## 2020-02-25 MED ORDER — METHYLPREDNISOLONE SODIUM SUCC 125 MG IJ SOLR
60.0000 mg | Freq: Two times a day (BID) | INTRAMUSCULAR | Status: DC
  Administered 2020-02-25 – 2020-02-26 (×3): 60 mg via INTRAVENOUS
  Filled 2020-02-25 (×3): qty 2

## 2020-02-25 MED ORDER — HYDROMORPHONE HCL 1 MG/ML IJ SOLN
1.5000 mg | INTRAMUSCULAR | Status: AC | PRN
Start: 2020-02-25 — End: 2020-02-26
  Administered 2020-02-25 – 2020-02-26 (×4): 1.5 mg via INTRAVENOUS
  Filled 2020-02-25 (×5): qty 2

## 2020-02-25 NOTE — Progress Notes (Signed)
NAME:  Leonard [name], MRN:  [id number], DOB:  July 15, 1960, LOS: 5 ADMISSION DATE:  02/20/2020, CONSULTATION DATE:  02/25/20 REFERRING MD:  Tana Coast, CHIEF COMPLAINT:  Shortness of breath   Brief History:  60 y.o. M with PMH of metastatic Liposarcoma s/p 7 cycles chemotherapy now on palliation therapy with recurrent LLL hydropneumothorax.      History of Present Illness:  60 y.o. M with PMH of metastatic Liposarcoma s/p 7 cycles chemotherapy now on palliation therapy with recurrent LLL hydropneumothorax.   Hospitalized 1/26-2/4 requiring pigtail chest tube and then large bore chest tube.  presented with left pneumothorax 1/26, left pigtail placed, air leak resolved and pigtail was removed on 28th with collapse of left lung requiring placement of large bore chest tube.  Air leak again resolved, this tube was clamped for 3 days prior to removal.   Pt reports he was feeling fairly well after discharge, coughed hard the morning of 2/8 and felt worsening shortness of breath and had some drainage from the site of prior chest tube.  He saw his oncologist who did outpatient CXR showing re-accumulation of pneumothorax, so was sent to the ED.   He was been stable on room air.  CT surgery consulted by ED and did not think that pt was a surgical candidate and recommended admission for observation.    CXR on 2/9 with stable pneumothorax without any tension component, pt does feel slightly worse this morning, but remains on room air.   PCCM consulted for possible repeat chest tube vs. Pleurx.   Past Medical History:   has a past medical history of Cancer ([place]) and Port-A-Cath in place (05/19/2019).  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 Multiple mass lesions throughout the lungs again noted, largest left upper lobe consistent with known neoplasm. Atelectasis right mid lung. No new opacity evident compared to 1 day prior. Stable cardiac silhouette.   Micro Data:  2/8 Covid.19, flu>>negative   Antimicrobials:    Interim History / Subjective:  Complaints of pain at site of tile produces  Objective   Blood pressure (!) 132/99, pulse 91, temperature 98.1 F (36.7 C), temperature source Oral, resp. rate (!) 22, height 5\' 10"  (1.778 m), weight 93 kg, SpO2 99 %.       No intake or output data in the 24 hours ending 02/25/20 1027 Filed Weights   02/20/20 1735  Weight: 93 kg   General: No acute distress HEENT: No JVD Neuro: Somewhat lethargic from multiple doses of narcotic CV: Heart sounds are regular PULM: Left chest tube in 20 cm no air leak minimal drainage per chest x-ray GI: soft, bsx4 active  GU: Voids Extremities: warm/dry,  edema  Skin: no rashes or lesions      Resolved Hospital Problem list     Assessment & Plan:   Recurrent left secondary pneumothorax Pigtail catheter is in place 20 cm of suction although is ordered for 40  Continue catheter Status post talc pleurodesis 02/24/2020 reports of chest pain and increased hemoptysis Continue catheter Pneumothorax is resolved no chest x-ray Continue to treat pain from talc pleurodesis and monitor chest tube output which is minimal at this time.       Left lower extremity Liposarcoma with metastatic disease to the  lung, LLL necrotic mass with recurrent hydropneumothorax Diagnosed 2020, follows with [facility].  S/p 7 cycles gemcitabine and docetaxel with progression.  Oncology felt that further chemotherapy would not be helpful.   Plan to have Guardant 360 mutation testing at [facility] or [facility].    Hemoptysis -related to lung mets. 2 episodes of hemoptysis following talc pleurodesis on 02/24/2020     Goals of Care:  Last  date of multidisciplinary goals of care discussion:per primary Family and staff present:  Summary of discussion:  Follow up goals of care discussion due:  Code Status: Full code, pt states he wants to pursue further intervention as needed, continue aggressive care  Labs   CBC: Recent Labs  Lab 02/20/20 1811 02/21/20 0010 02/22/20 0222 02/23/20 0233 02/24/20 0413 02/25/20 0427  WBC 8.5 7.6 6.1 5.5 6.4 8.3  NEUTROABS 6.3  --   --   --   --   --   HGB 14.1 12.5* 11.0* 11.0* 11.5* 11.4*  HCT 43.3 37.7* 34.9* 32.9* 35.7* 35.6*  MCV 98.2 97.7 97.8 96.8 97.3 98.9  PLT 415* 365 315 297 287 379    Basic Metabolic Panel: Recent Labs  Lab 02/21/20 0010 02/22/20 0222 02/23/20 0233 02/24/20 0413 02/25/20 0427  NA 139 138 137 138 137  K 3.7 3.8 3.5 3.8 3.9  CL 107 107 106 107 103  CO2 23 23 24 23 26   GLUCOSE 84 101* 107* 93 122*  BUN 9 9 9 8 12   CREATININE 0.69 0.68 0.60* 0.61 0.57*  CALCIUM 8.7* 8.4* 8.3* 8.7* 8.6*  MG  --   --   --   --  2.0  PHOS  --   --   --   --  4.3   GFR: Estimated Creatinine Clearance: 112.5 mL/min (A) (by C-G formula based on SCr of 0.57 mg/dL (L)). Recent Labs  Lab 02/22/20 0222 02/23/20 0233 02/24/20 0413 02/25/20 0427  WBC 6.1 5.5 6.4 8.3    Liver Function Tests: Recent Labs  Lab 02/20/20 1811 02/21/20 0010  AST 43* 40  ALT 32 28  ALKPHOS 133* 118  BILITOT 0.8 0.9  PROT 7.0 6.1*  ALBUMIN 3.4* 2.9*   No results for input(s): LIPASE, AMYLASE in the last 168 hours. No results for input(s): AMMONIA in the last 168 hours.    Richardson Landry Latishia Suitt ACNP Acute Care Nurse Practitioner [place] Please consult Amion 02/25/2020, 10:27 AM

## 2020-02-25 NOTE — Progress Notes (Signed)
[facility]                                                                              Patient Demographics  Leonard [name], is a 60 y.o. male, DOB - February 10, 1960, CBJ:[id number]  Admit date - 02/20/2020   Admitting Physician Elwyn Reach, MD  Outpatient Primary MD for the patient is Celene Squibb, MD  Outpatient specialists:   LOS - 5  days   Medical records reviewed and are as summarized below:    Chief Complaint  Patient presents with  . 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  At that time he had more than 50% left-sided pneumothorax that was spontaneous.  Patient apparently had been doing fine until the morning of admission, when he had a coughing spell, started feeling more short of breath and pain on the left side of the chest.  Patient went to see his oncologist, x-ray showed reaccumulation of pneumothorax.  Patient had hydropneumothorax associated with the necrotic malignant mass. TCTS was consulted by EDP, recommended admission for observation. Chest x-ray showed persistent bilateral airspace opacity in addition to a moderate-sized left sided hydropneumothorax.  Assessment & Plan    Principal Problem:   Spontaneous hydropneumothorax, recurrent in the setting of metastatic disease to the lung, LLL malignant necrotic mass - Very uncomfortable today with pain and diffusely wheezing, O2 sats 99% on room air.  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  Oncology felt further chemotherapy would not be helpful due to progression of the disease. -high risk of readmissions, recurrent hydropneumothorax, metastatic malignancy.  Palliative care was consulted during previous admission, not seen on 2/1, recommended outpatient palliative to follow -If no significant improvement in pain, will consult palliative medicine for pain management    Esophageal dysphagia -No issues, currently on regular diet    Dehydration -Improved, patient was placed on IV fluid hydration  Anxiety, depression -Stable, continue Lexapro, Xanax   Code Status: Full CODE STATUS DVT Prophylaxis:     Level of Care: Level of care: Telemetry Surgical Family Communication: Discussed all imaging results, lab results, explained to the  patient's wife Bennett Ram on phone [phone]  Disposition Plan:     Status is: Inpatient  Remains inpatient appropriate because:Inpatient level of care appropriate due to severity of illness   Dispo: The patient is from: Home              Anticipated d/c  is to: Home              Anticipated d/c date is: > 3 days              Patient currently is not medically stable to d/c.  Currently has chest tube   Difficult to place patient No   Time Spent in minutes   35 minutes  Procedures:  Pigtail catheter placement on 02/21/20, talc pleurodesis on 2/12  Consultants:   TCTS PCCM  Antimicrobials:   Anti-infectives (From admission, onward)   None         Medications  Scheduled Meds: . acetaminophen  1,000 mg Oral TID  . ALPRAZolam  0.25 mg Oral QHS  . benzonatate  200 mg Oral TID  . Chlorhexidine Gluconate Cloth  6 each Topical Daily  . escitalopram  10 mg Oral QHS  . gabapentin  300 mg Oral TID  . ipratropium-albuterol  3 mL Nebulization QID  .  lidocaine (PF)  250 mg Other Once  . methylPREDNISolone (SOLU-MEDROL) injection  60 mg Intravenous Q12H  . polyethylene glycol  17 g Oral Daily  . senna-docusate  1 tablet Oral Q1400  . sodium chloride flush  10 mL Intracatheter Q8H  . sodium chloride flush  10-40 mL Intracatheter Q12H  . zolpidem  10 mg Oral QHS   Continuous Infusions:  PRN Meds:.ALPRAZolam, HYDROcodone-homatropine, HYDROmorphone (DILAUDID) injection, ondansetron **OR** ondansetron (ZOFRAN) IV, oxyCODONE, pantoprazole, prochlorperazine, sodium chloride flush      Subjective:   [name] was seen and examined today. At the time of my examination this morning, feeling miserable today with intractable pain, states 10/10, diffusely wheezing. No vomiting abdominal pain or any diarrhea. No fevers  Objective:   Vitals:   02/24/20 2132 02/25/20 0637 02/25/20 0925 02/25/20 0928  BP: 132/84 133/85  (!) 132/99  Pulse: 84 91    Resp: 18   (!) 22  Temp: 98.7 F (37.1 C)   98.1 F (36.7 C)  TempSrc: Oral   Oral  SpO2: 95% 99% 95% 99%  Weight:      Height:       No intake or output data in the 24 hours ending 02/25/20 1109   Wt Readings from Last 3 Encounters:  02/20/20 93 kg  02/20/20 93 kg  02/16/20 94.4 kg   Physical Exam  General: Alert and oriented x 3, uncomfortable  Cardiovascular: S1 S2 clear, RRR. No pedal edema b/l  Respiratory: Bilateral wheezing, + left-sided chest tube, no air leak  Gastrointestinal: Soft, nontender, nondistended, NBS  Ext: no pedal edema bilaterally  Neuro: no new deficits  Musculoskeletal: No cyanosis, clubbing  Skin: No rashes  Psych: uncomfortable, with pain   Data Reviewed:  I have personally reviewed following labs and imaging studies  Micro Results Recent Results (from the past 240 hour(s))  Resp Panel by RT-PCR (Flu A&B, Covid) Nasopharyngeal Swab     Status: None   Collection Time: 02/20/20  6:30 PM   Specimen: Nasopharyngeal Swab; Nasopharyngeal(NP)  swabs in vial transport medium  Result Value Ref Range Status   SARS Coronavirus 2 by RT PCR NEGATIVE NEGATIVE Final    Comment: (NOTE) SARS-CoV-2 target nucleic acids are NOT DETECTED.  The SARS-CoV-2 RNA is generally detectable in upper respiratory specimens during the acute phase of infection. The lowest concentration of SARS-CoV-2 viral copies this assay can detect is 138 copies/mL. A negative result does not preclude SARS-Cov-2 infection and should not be used as the sole basis for  treatment or other patient management decisions. A negative result may occur with  improper specimen collection/handling, submission of specimen other than nasopharyngeal swab, presence of viral mutation(s) within the areas targeted by this assay, and inadequate number of viral copies(<138 copies/mL). A negative result must be combined with clinical observations, patient history, and epidemiological information. The expected result is Negative.  Fact Sheet for Patients:  EntrepreneurPulse.com.au  Fact Sheet for Healthcare Providers:  IncredibleEmployment.be  This test is no t yet approved or cleared by the [place] FDA and  has been authorized for detection and/or diagnosis of SARS-CoV-2 by FDA under an Emergency Use Authorization (EUA). This EUA will remain  in effect (meaning this test can be used) for the duration of the COVID-19 declaration under Section 564(b)(1) of the Act, 21 U.S.C.section 360bbb-3(b)(1), unless the authorization is terminated  or revoked sooner.       Influenza A by PCR NEGATIVE NEGATIVE Final   Influenza B by PCR NEGATIVE NEGATIVE Final    Comment: (NOTE) The Xpert Xpress SARS-CoV-2/FLU/RSV plus assay is intended as an aid in the diagnosis of influenza from Nasopharyngeal swab specimens and should not be used as a sole basis for treatment. Nasal washings and aspirates are unacceptable for Xpert Xpress  SARS-CoV-2/FLU/RSV testing.  Fact Sheet for Patients: EntrepreneurPulse.com.au  Fact Sheet for Healthcare Providers: IncredibleEmployment.be  This test is not yet approved or cleared by the [place] FDA and has been authorized for detection and/or diagnosis of SARS-CoV-2 by FDA under an Emergency Use Authorization (EUA). This EUA will remain in effect (meaning this test can be used) for the duration of the COVID-19 declaration under Section 564(b)(1) of the Act, 21 U.S.C. section 360bbb-3(b)(1), unless the authorization is terminated or revoked.  Performed at [facility], [place] [address] [place], [place] [address]     Radiology Reports DG Chest 2 View  Result Date: 02/20/2020 CLINICAL DATA:  Metastatic sarcoma.  Follow-up pneumothorax EXAM: CHEST - 2 VIEW COMPARISON:  02/15/2020 FINDINGS: Progression of left pneumothorax now approximately 30-40%. Small amount of pleural fluid in the left lung base. Gas in the chest wall on the left is unchanged. Multiple bilateral lung nodules most compatible with metastatic disease, unchanged. Port-A-Cath tip SVC unchanged. Small right pleural effusion. IMPRESSION: Interval progression of left pneumothorax approximately 30-40%. These results were called by telephone at the time of interpretation on 02/20/2020 at 3:53 pm to provider [facility] , who verbally acknowledged these results. Electronically Signed   By: Franchot Gallo M.D.   On: 02/20/2020 15:53   DG Chest 2 View  Result Date: 02/15/2020 CLINICAL DATA:  Pneumothorax, recent chest tube removal, history of lung cancer EXAM: CHEST - 2 VIEW COMPARISON:  Radiograph 02/14/2020, CT 02/06/2020 FINDINGS: Accessed right IJ approach Port-A-Cath tip terminates in the lower SVC. Telemetry leads and support devices overlie the chest. Cholecystectomy clips in the right upper quadrant. Persistent subcutaneous emphysema extending across the left chest wall.  Suspect small residual left apical visceral pleural line with additional increasing lucency along the left heart and mediastinal borders which could reflect a pneumomediastinum or less likely medial pneumothorax. Small bilateral effusions. Persistent heterogeneous opacities are again seen in the mid to lower lungs with additional bilateral pulmonary masses, largest in the left lung apex. No acute or worrisome osseous abnormality. IMPRESSION: 1. Suspect small residual left apical visceral pleural line/apical pneumothorax. Additional increasing lucency along the left heart and mediastinal borders which could reflect a pneumomediastinum or less likely medial pneumothorax component. Small  bilateral effusions. 2. Extensive subcutaneous emphysema remains of the left chest wall. 3. Persistent heterogeneous opacities in the mid to lower lungs, favor edema and atelectasis. 4. Redemonstrated pulmonary masses, largest in the left apex. Electronically Signed   By: Lovena Le M.D.   On: 02/15/2020 06:46   DG Chest 2 View  Result Date: 02/14/2020 CLINICAL DATA:  Pneumothorax EXAM: CHEST - 2 VIEW COMPARISON:  Chest x-ray 02/13/2020 FINDINGS: Right chest wall Port-A-Cath and left chest tube in stable appropriate position. The heart size and mediastinal contours are within normal limits. Persistent small apical left pneumothorax. Persistent bilateral pulmonary masses again noted. Possible trace right pleural effusion. No definite left pleural effusion. No acute osseous abnormality. IMPRESSION: 1. Grossly stable small left apical pneumothorax. 2. Trace right pleural effusion. 3. Persistent bilateral pulmonary masses. 4. Lines and tubes in stable position. Electronically Signed   By: Iven Finn M.D.   On: 02/14/2020 06:54   DG Chest 2 View  Result Date: 02/13/2020 CLINICAL DATA:  Chest tube. EXAM: CHEST - 2 VIEW COMPARISON:  02/12/2020. FINDINGS: Left chest tube in stable position. Left pneumothorax improved from prior  exam with tiny residual left apical pneumothorax. Persistent bilateral pulmonary masses again noted. Small right pleural effusion. Heart size stable. Degenerative change thoracic spine. IMPRESSION: 1. Left chest tube in stable position. Left pneumothorax improved from prior exam with tiny residual left apical pneumothorax. 2. Persistent bilateral pulmonary masses again noted. Small right pleural effusion. Electronically Signed   By: Marcello Moores  Register   On: 02/13/2020 07:00   CT CHEST ABDOMEN PELVIS W CONTRAST  Result Date: 02/06/2020 CLINICAL DATA:  59 year old male with history of liposarcoma in the left lower extremity. EXAM: CT CHEST, ABDOMEN, AND PELVIS WITH CONTRAST TECHNIQUE: Multidetector CT imaging of the chest, abdomen and pelvis was performed following the standard protocol during bolus administration of intravenous contrast. CONTRAST:  188mL OMNIPAQUE IOHEXOL 300 MG/ML  SOLN COMPARISON:  CT the chest, abdomen and pelvis 11/01/2019. FINDINGS: CT CHEST FINDINGS Cardiovascular: Heart size is normal. Trace amount of pericardial fluid and/or thickening, unlikely to be of hemodynamic significance at this time. No associated pericardial calcification. There is aortic atherosclerosis, as well as atherosclerosis of the great vessels of the mediastinum and the coronary arteries, including calcified atherosclerotic plaque in the left main, left anterior descending and right coronary arteries.Right internal jugular single-lumen porta cath with tip terminating at the superior cavoatrial junction. Mediastinum/Nodes: No pathologically enlarged mediastinal or hilar lymph nodes. Esophagus is unremarkable in appearance. No axillary lymphadenopathy. Lungs/Pleura: Multiple large pulmonary nodules and masses are again noted in the lungs bilaterally, generally increased in size compared to the prior examination. The largest of these is in the left upper lobe near the apex (axial image 17 of series 2) measuring 5.5 x 5.6 cm,  significantly increased from 2.1 x 1.8 cm on the prior examination. This lesion is centrally low-attenuation, suggesting internal areas of necrosis. Small bilateral pleural effusions. New large left pneumothorax with considerable atelectasis in the left lung. Musculoskeletal: There are no aggressive appearing lytic or blastic lesions noted in the visualized portions of the skeleton. CT ABDOMEN PELVIS FINDINGS Hepatobiliary: No suspicious cystic or solid hepatic lesions. No intra or extrahepatic biliary ductal dilatation. Status post cholecystectomy. Pancreas: No pancreatic mass. No pancreatic ductal dilatation. No pancreatic or peripancreatic fluid collections or inflammatory changes. Spleen: Unremarkable. Adrenals/Urinary Tract: In the upper pole the left kidney there is enhancing lesion measuring 1.7 cm in diameter, increased in size compared to the prior study (  previously 1.2 cm), concerning for neoplasm. Exophytic 1.2 cm low-attenuation lesion in the lower pole the left kidney, compatible with a small simple cyst. Right kidney and bilateral adrenal glands are normal in appearance. No hydroureteronephrosis. Urinary bladder is normal in appearance. Stomach/Bowel: The appearance of the stomach is normal. There is no pathologic dilatation of small bowel or colon. Normal appendix. Vascular/Lymphatic: Aortic atherosclerosis, without evidence of aneurysm or dissection in the abdominal or pelvic vasculature. No lymphadenopathy noted in the abdomen or pelvis. Reproductive: Prostate gland and seminal vesicles are unremarkable in appearance. Other: No significant volume of ascites.  No pneumoperitoneum. Musculoskeletal: There are no aggressive appearing lytic or blastic lesions noted in the visualized portions of the skeleton. IMPRESSION: 1. New large left hydropneumothorax. 2. Widespread metastatic disease to the lungs appears progressive compared to the prior study, as above. 3. Enlarging probably enhancing lesion in the  upper pole the left kidney concerning for neoplasm, either metastatic or primary. 4. Small right pleural effusion. 5. Aortic atherosclerosis, in addition to left main and 2 vessel coronary artery disease. Please note that although the presence of coronary artery calcium documents the presence of coronary artery disease, the severity of this disease and any potential stenosis cannot be assessed on this non-gated CT examination. Assessment for potential risk factor modification, dietary therapy or pharmacologic therapy may be warranted, if clinically indicated. 6. Additional incidental findings, as above. Critical Value/emergent results were called by telephone at the time of interpretation on 02/06/2020 at 11:35 am to provider [facility] , who verbally acknowledged these results. Electronically Signed   By: Vinnie Langton M.D.   On: 02/06/2020 11:36   DG Chest Port 1 View  Result Date: 02/25/2020 CLINICAL DATA:  Chest tube present EXAM: PORTABLE CHEST 1 VIEW COMPARISON:  February 24, 2020. FINDINGS: Chest tube position on the left is unchanged. Port-A-Cath tip is at the cavoatrial junction, stable. Subcutaneous air remains on the left. No pneumothorax evident. Multiple mass lesions again noted consistent with known metastatic foci. Atelectatic change stable in left base and right mid lung regions. New mild atelectatic change left mid lung. Heart upper normal in size, stable, with pulmonary vascularity normal. No appreciable adenopathy. IMPRESSION: Stable tube and catheter positions. No pneumothorax. Metastatic disease again noted. Areas of atelectatic change in the right mid lung and left base noted. New mild atelectasis left mid lung. Stable cardiac silhouette. Electronically Signed   By: Lowella Grip III M.D.   On: 02/25/2020 10:09   DG Chest Port 1 View  Result Date: 02/24/2020 CLINICAL DATA:  Liposarcoma. Chest tube in place for recent pneumothorax EXAM: PORTABLE CHEST 1 VIEW COMPARISON:   February 23, 2020 FINDINGS: Chest tube remains on the left, unchanged in position. There is subcutaneous air on the left is again noted. Port-A-Cath tip is in the superior vena cava near the cavoatrial junction. Currently there is no appreciable pneumothorax. Multiple mass lesions remain, largest in left upper lobe, consistent with metastatic foci. Atelectatic change noted in left base and right mid lung. Heart upper normal in size with pulmonary vascularity normal. No adenopathy appreciable by radiography. Degenerative change noted in each shoulder. IMPRESSION: Tube and catheter positions as described without appreciable pneumothorax. Pulmonary metastases again noted without change. There is left base and right midlung atelectatic change. No consolidation. Stable cardiac silhouette. Electronically Signed   By: Lowella Grip III M.D.   On: 02/24/2020 09:08   DG Chest Port 1 View  Result Date: 02/23/2020 CLINICAL DATA:  60 year old male with  metastatic sarcoma. Pneumothorax, chest tube. EXAM: PORTABLE CHEST 1 VIEW COMPARISON:  Portable chest 02/22/2020 and earlier. FINDINGS: Stable left chest tube. Small left pneumothorax has decreased, trace residual with pleural edge visible laterally. Small volume left chest wall gas has decreased. Underlying pulmonary metastatic disease, stable bilateral pulmonary masses. Stable cardiac size and mediastinal contours. Stable right chest power port. Visualized tracheal air column is within normal limits. No new pulmonary opacity. Stable visualized osseous structures. IMPRESSION: 1. Stable left chest tube. Decreased and now trace left pneumothorax. 2. Stable pulmonary metastatic disease. Electronically Signed   By: Genevie Ann M.D.   On: 02/23/2020 06:34   DG CHEST PORT 1 VIEW  Result Date: 02/22/2020 CLINICAL DATA:  Follow-up chest tube EXAM: PORTABLE CHEST 1 VIEW COMPARISON:  02/21/2020 FINDINGS: Cardiac shadow is enlarged but stable. Right chest wall port is again seen.  Bilateral metastatic lesions are again identified and stable. Pigtail catheter is noted on the left. Small pneumothorax is again seen. No new focal abnormality is noted. IMPRESSION: Stable small left pneumothorax. Multifocal pulmonary metastatic disease as described. Electronically Signed   By: Inez Catalina M.D.   On: 02/22/2020 10:06   DG Chest Portable 1 View  Result Date: 02/21/2020 CLINICAL DATA:  Chest tube placement for pneumothorax EXAM: PORTABLE CHEST 1 VIEW COMPARISON:  February 21, 2020 study obtained earlier in the day FINDINGS: Chest tube now present on the left with tip directed medially. Small residual lateral pneumothorax without tension component. Subcutaneous air noted on the left. Multiple mass lesions again noted throughout the lungs, largest left upper lobe region. Atelectatic change again noted in right mid and lower lung regions. Heart size and pulmonary vascularity are normal. No adenopathy evident. Port-A-Cath tip in superior vena cava. IMPRESSION: Left-sided pneumothorax much smaller after chest tube placement. No tension component. 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 Atelectatic change again noted on the right. Stable cardiac silhouette. Electronically Signed   By: Lowella Grip III M.D.   On: 02/21/2020 11:25   DG CHEST PORT 1 VIEW  Result Date: 02/21/2020 CLINICAL DATA:  Known pneumothorax. History of lower extremity liposarcoma EXAM: PORTABLE CHEST 1 VIEW COMPARISON:  February 20, 2020 chest radiograph and chest CT February 06, 2020 FINDINGS: Previously noted pneumothorax on the left is unchanged without appreciable tension component. Subcutaneous air noted along the lateral left hemithorax. Multiple mass lesions throughout the lungs, largest in the left upper lobe, remain without change. Atelectatic change noted in right mid lung. No new opacity evident. Heart is upper normal in size with pulmonary vascularity within normal limits. No adenopathy evident.  Port-A-Cath tip in superior vena cava. No bone lesions. IMPRESSION: Stable left-sided pneumothorax that tension component. Multiple mass lesions throughout the lungs again noted, largest left upper lobe consistent with known neoplasm. Atelectasis right mid lung. No new opacity evident compared to 1 day prior. Stable cardiac silhouette. Electronically Signed   By: Lowella Grip III M.D.   On: 02/21/2020 08:13   DG Chest Port 1 View  Result Date: 02/20/2020 CLINICAL DATA:  Pneumothorax follow-up EXAM: PORTABLE CHEST 1 VIEW COMPARISON:  February 20, 2020 FINDINGS: There is a persistent unchanged left-sided pneumothorax. There is subcutaneous gas along the patient's left flank. There are airspace opacities involving the left upper lung zone and right lung base. There is a well-positioned right-sided Port-A-Cath. No definite right-sided pneumothorax. There is a small right-sided pleural effusion. IMPRESSION: No significant interval change. Persistent bilateral airspace opacities in addition to a moderate-sized left-sided pneumothorax. Electronically Signed  By: Constance Holster M.D.   On: 02/20/2020 18:47   DG Chest Port 1 View  Result Date: 02/12/2020 CLINICAL DATA:  60 year old male with history of pneumothorax status post chest tube placement. EXAM: PORTABLE CHEST 1 VIEW COMPARISON:  Chest x-ray 02/09/2020. FINDINGS: Right internal jugular single-lumen power porta cath with tip terminating at the superior cavoatrial junction. Left-sided chest tube in position with tip and side port projecting over the lower left hemithorax. Small persistent left pneumothorax, increased slightly in size compared to the prior study. No right-sided pneumothorax. Multiple pulmonary nodules and masses again noted, better demonstrated on prior chest CT 02/06/2020. No definite acute consolidative airspace disease. No pleural effusions. No evidence of pulmonary edema. Heart size is normal. Upper mediastinal contours are within  normal limits. Extensive subcutaneous emphysema in the left chest wall again noted. IMPRESSION: 1. Support apparatus, as above. 2. Increased size of small left-sided pneumothorax. 3. Multiple pulmonary nodules and masses, similar to prior examinations, presumably reflective of metastatic disease. Electronically Signed   By: Vinnie Langton M.D.   On: 02/12/2020 08:16   DG CHEST PORT 1 VIEW  Result Date: 02/09/2020 CLINICAL DATA:  Follow-up hydropneumothorax. Status post chest tube removal. EXAM: PORTABLE CHEST 1 VIEW COMPARISON:  02/09/2020 FINDINGS: Right chest wall port a catheter is noted with tip at the cavoatrial junction. The left-sided chest tube has been removed. Small left apical pneumothorax persists. Unchanged appearance of gas within the soft tissues of the left chest wall. Masslike opacities within the left upper lobe and right base are unchanged. IMPRESSION: Persistent small left apical pneumothorax status post left-sided chest tube removal. Electronically Signed   By: Kerby Moors M.D.   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 Recent left pneumothorax. EXAM: PORTABLE CHEST 1 VIEW COMPARISON:  Portable chest 02/08/2020 and earlier. FINDINGS: Portable AP upright view at 0816 hours. Left chest tube is in place although there may be side holes outside of the left pleural space (arrow). A small volume of left chest wall gas has increased. Small residual left pneumothorax. No mediastinal shift. Masslike bilateral pulmonary opacities compatible with known metastases. Stable cardiac size and mediastinal contours. Visualized tracheal air column is within normal limits. Stable right chest porta cath. Negative visible bowel gas pattern. Stable visualized osseous structures. IMPRESSION: 1. Left chest tube in place although there may be tube side holes outside of the pleural space. A small volume of chest wall gas has  increased. 2. Stable small residual pneumothorax. 3. Stable pulmonary metastases. Electronically Signed   By: Genevie Ann M.D.   On: 02/09/2020 08:25   DG CHEST PORT 1 VIEW  Result Date: 02/08/2020 CLINICAL DATA:  Hydropneumothorax, chest tube on LEFT EXAM: PORTABLE CHEST 1 VIEW COMPARISON:  Portable exam 0086 hours compared to 02/07/2020 FINDINGS: Pigtail RIGHT thoracostomy tube, partially withdrawn since previous exam with tip projecting at LEFT costal margin. Persistent small LEFT apex pneumothorax. Basilar LEFT pleural effusion. Normal heart size mediastinal contours. RIGHT jugular Port-A-Cath stable tip projecting over SVC. Atelectasis versus infiltrate at RIGHT base. BILATERAL pulmonary nodules, largest a mass at LEFT apex. Bones demineralized. IMPRESSION: Persistent small LEFT hydropneumothorax. LEFT thoracostomy tube has been partially withdrawn with the pigtail now at LEFT costal margin. Electronically Signed   By: Lavonia Dana M.D.   On: 02/08/2020 15:11   DG Chest Portable 1 View  Result Date: 02/07/2020 CLINICAL DATA:  Follow-up left pneumothorax.  Lung  carcinoma. EXAM: PORTABLE CHEST 1 VIEW COMPARISON:  Prior today FINDINGS: There has been placement of a left pleural pigtail catheter since previous study, with near complete resolution of left pneumothorax since prior exam. Multiple pulmonary nodules and masses are again seen in both lungs, largest in the left upper lobe and right lower lobe. Small right pleural effusion remains stable. Right-sided Port-A-Cath remains in appropriate position. IMPRESSION: Near complete resolution of left pneumothorax following left pleural catheter placement. Electronically Signed   By: Marlaine Hind M.D.   On: 02/07/2020 14:55   DG Chest Portable 1 View  Result Date: 02/07/2020 CLINICAL DATA:  Left pneumothorax. EXAM: PORTABLE CHEST 1 VIEW COMPARISON:  CT 02/06/2020. FINDINGS: Mediastinum hilar structures normal. Borderline cardiomegaly. Large left pneumothorax  noted. Similar finding noted on prior CT. Prominent atelectasis left lung. Multiple prominent bilateral pulmonary masses again noted as noted on prior CT. No acute bony abnormality identified. Contrast in the colon. IMPRESSION: 1. Large left pneumothorax. Similar finding noted on prior CT. Prominent atelectasis left lung. 2. Multiple prominent bilateral pulmonary masses again noted as noted on prior CT. Electronically Signed   By: Marcello Moores  Register   On: 02/07/2020 13:50   DG Chest Port 1V same Day  Result Date: 02/09/2020 CLINICAL DATA:  Shortness of breath, history of metastatic lung carcinoma EXAM: PORTABLE CHEST 1 VIEW COMPARISON:  Film from earlier in the same day. FINDINGS: Cardiac shadow is stable. Left-sided chest tube is again identified. Small left apical pneumothorax is again identified stable in appearance. Stable left upper lobe mass lesion is noted. Nodules are noted in the right lung as well as patchy atelectatic changes in the bases bilaterally. Considerable subcutaneous emphysema is noted. No bony abnormality is seen. Right chest wall port is noted. IMPRESSION: Stable left apical pneumothorax with chest tube in place. Changes of metastatic disease within the lungs. Electronically Signed   By: Inez Catalina M.D.   On: 02/09/2020 23:23   DG Chest Port 1V same Day  Result Date: 02/09/2020 CLINICAL DATA:  Pneumothorax.  Chest tube placement. EXAM: PORTABLE CHEST 1 VIEW COMPARISON:  February 09, 2020 FINDINGS: There has been interval placement of a left-sided chest tube. There is a small residual left-sided pneumothorax, improved from prior study. The right-sided Port-A-Cath is well position. Bilateral airspace opacities are again noted, not substantially changed from prior study. There is subcutaneous emphysema along the patient's left flank, stable to improved from prior study. The heart size is unchanged. IMPRESSION: 1. Interval placement of a left-sided chest tube. 2. Small residual left-sided  pneumothorax, improved from prior study. 3. Otherwise, no significant short interval change. Electronically Signed   By: Constance Holster M.D.   On: 02/09/2020 16:25   DG Chest Port 1V same Day  Result Date: 02/09/2020 CLINICAL DATA:  Chest tube removal EXAM: PORTABLE CHEST 1 VIEW COMPARISON:  02/09/2020 FINDINGS: Further increased size of the left pneumothorax, now noted laterally and at the left apex, approximately 15%. Right Port-A-Cath remains in place, unchanged. Airspace opacity at the right lung base is unchanged. Heart is borderline in size. Subcutaneous emphysema throughout the left chest wall. IMPRESSION: Enlarging left pneumothorax, now approximately 15%. Otherwise no change. These results will be called to the ordering clinician or representative by the Radiologist Assistant, and communication documented in the PACS or [employer]. Electronically Signed   By: Rolm Baptise M.D.   On: 02/09/2020 15:33    Lab Data:  CBC: Recent Labs  Lab 02/20/20 1811 02/21/20 0010 02/22/20 0222 02/23/20 2229  02/24/20 0413 02/25/20 0427  WBC 8.5 7.6 6.1 5.5 6.4 8.3  NEUTROABS 6.3  --   --   --   --   --   HGB 14.1 12.5* 11.0* 11.0* 11.5* 11.4*  HCT 43.3 37.7* 34.9* 32.9* 35.7* 35.6*  MCV 98.2 97.7 97.8 96.8 97.3 98.9  PLT 415* 365 315 297 287 915   Basic Metabolic Panel: Recent Labs  Lab 02/21/20 0010 02/22/20 0222 02/23/20 0233 02/24/20 0413 02/25/20 0427  NA 139 138 137 138 137  K 3.7 3.8 3.5 3.8 3.9  CL 107 107 106 107 103  CO2 23 23 24 23 26   GLUCOSE 84 101* 107* 93 122*  BUN 9 9 9 8 12   CREATININE 0.69 0.68 0.60* 0.61 0.57*  CALCIUM 8.7* 8.4* 8.3* 8.7* 8.6*  MG  --   --   --   --  2.0  PHOS  --   --   --   --  4.3   GFR: Estimated Creatinine Clearance: 112.5 mL/min (A) (by C-G formula based on SCr of 0.57 mg/dL (L)). Liver Function Tests: Recent Labs  Lab 02/20/20 1811 02/21/20 0010  AST 43* 40  ALT 32 28  ALKPHOS 133* 118  BILITOT 0.8 0.9  PROT 7.0 6.1*   ALBUMIN 3.4* 2.9*   No results for input(s): LIPASE, AMYLASE in the last 168 hours. No results for input(s): AMMONIA in the last 168 hours. Coagulation Profile: Recent Labs  Lab 02/20/20 1811  INR 1.1   Cardiac Enzymes: No results for input(s): CKTOTAL, CKMB, CKMBINDEX, TROPONINI in the last 168 hours. BNP (last 3 results) No results for input(s): PROBNP in the last 8760 hours. HbA1C: No results for input(s): HGBA1C in the last 72 hours. CBG: No results for input(s): GLUCAP in the last 168 hours. Lipid Profile: No results for input(s): CHOL, HDL, LDLCALC, TRIG, CHOLHDL, LDLDIRECT in the last 72 hours. Thyroid Function Tests: No results for input(s): TSH, T4TOTAL, FREET4, T3FREE, THYROIDAB in the last 72 hours. Anemia Panel: No results for input(s): VITAMINB12, FOLATE, FERRITIN, TIBC, IRON, RETICCTPCT in the last 72 hours. Urine analysis: No results found for: COLORURINE, APPEARANCEUR, LABSPEC, PHURINE, GLUCOSEU, HGBUR, BILIRUBINUR, KETONESUR, PROTEINUR, UROBILINOGEN, NITRITE, LEUKOCYTESUR   Ripudeep Rai M.D. [facility] 02/25/2020, 11:09 AM   Call night coverage person covering after 7pm

## 2020-02-26 ENCOUNTER — Inpatient Hospital Stay (HOSPITAL_COMMUNITY): Payer: 59

## 2020-02-26 DIAGNOSIS — J9383 Other pneumothorax: Secondary | ICD-10-CM | POA: Diagnosis not present

## 2020-02-26 DIAGNOSIS — Z9689 Presence of other specified functional implants: Secondary | ICD-10-CM | POA: Diagnosis not present

## 2020-02-26 DIAGNOSIS — E860 Dehydration: Secondary | ICD-10-CM | POA: Diagnosis not present

## 2020-02-26 DIAGNOSIS — J9312 Secondary spontaneous pneumothorax: Secondary | ICD-10-CM | POA: Diagnosis not present

## 2020-02-26 LAB — CBC WITH DIFFERENTIAL/PLATELET
Abs Immature Granulocytes: 0.06 10*3/uL (ref 0.00–0.07)
Basophils Absolute: 0 10*3/uL (ref 0.0–0.1)
Basophils Relative: 0 %
Eosinophils Absolute: 0 10*3/uL (ref 0.0–0.5)
Eosinophils Relative: 0 %
HCT: 33.8 % — ABNORMAL LOW (ref 39.0–52.0)
Hemoglobin: 11.3 g/dL — ABNORMAL LOW (ref 13.0–17.0)
Immature Granulocytes: 0 %
Lymphocytes Relative: 1 %
Lymphs Abs: 0.2 10*3/uL — ABNORMAL LOW (ref 0.7–4.0)
MCH: 32.3 pg (ref 26.0–34.0)
MCHC: 33.4 g/dL (ref 30.0–36.0)
MCV: 96.6 fL (ref 80.0–100.0)
Monocytes Absolute: 0.2 10*3/uL (ref 0.1–1.0)
Monocytes Relative: 2 %
Neutro Abs: 13 10*3/uL — ABNORMAL HIGH (ref 1.7–7.7)
Neutrophils Relative %: 97 %
Platelets: 262 10*3/uL (ref 150–400)
RBC: 3.5 MIL/uL — ABNORMAL LOW (ref 4.22–5.81)
RDW: 16.2 % — ABNORMAL HIGH (ref 11.5–15.5)
WBC: 13.5 10*3/uL — ABNORMAL HIGH (ref 4.0–10.5)
nRBC: 0 % (ref 0.0–0.2)

## 2020-02-26 LAB — BASIC METABOLIC PANEL
Anion gap: 9 (ref 5–15)
BUN: 13 mg/dL (ref 6–20)
CO2: 24 mmol/L (ref 22–32)
Calcium: 8.9 mg/dL (ref 8.9–10.3)
Chloride: 104 mmol/L (ref 98–111)
Creatinine, Ser: 0.55 mg/dL — ABNORMAL LOW (ref 0.61–1.24)
GFR, Estimated: >60 mL/min (ref >60)
Glucose, Bld: 170 mg/dL — ABNORMAL HIGH (ref 70–99)
Potassium: 4.7 mmol/L (ref 3.5–5.1)
Sodium: 137 mmol/L (ref 135–145)

## 2020-02-26 LAB — MAGNESIUM: Magnesium: 2.1 mg/dL (ref 1.7–2.4)

## 2020-02-26 LAB — PHOSPHORUS: Phosphorus: 3.5 mg/dL (ref 2.5–4.6)

## 2020-02-26 MED ORDER — PANTOPRAZOLE SODIUM 40 MG PO TBEC
40.0000 mg | DELAYED_RELEASE_TABLET | Freq: Two times a day (BID) | ORAL | Status: DC
  Administered 2020-02-26 – 2020-02-29 (×6): 40 mg via ORAL
  Filled 2020-02-26 (×7): qty 1

## 2020-02-26 MED ORDER — DOCUSATE SODIUM 100 MG PO CAPS
100.0000 mg | ORAL_CAPSULE | Freq: Two times a day (BID) | ORAL | Status: DC
  Administered 2020-02-26 – 2020-02-28 (×3): 100 mg via ORAL
  Filled 2020-02-26 (×5): qty 1

## 2020-02-26 NOTE — Progress Notes (Signed)
NAME:  Leonard [name], MRN:  [id number], DOB:  1960-06-23, LOS: 6 ADMISSION DATE:  02/20/2020, CONSULTATION DATE:  02/26/20 REFERRING MD:  Tana Coast, CHIEF COMPLAINT:  Shortness of breath   Brief History:  60 y.o. M with PMH of metastatic Liposarcoma s/p 7 cycles chemotherapy now on palliation therapy with recurrent LLL hydropneumothorax.      History of Present Illness:  60 y.o. M with PMH of metastatic Liposarcoma s/p 7 cycles chemotherapy now on palliation therapy with recurrent LLL hydropneumothorax.   Hospitalized 1/26-2/4 requiring pigtail chest tube and then large bore chest tube.  presented with left pneumothorax 1/26, left pigtail placed, air leak resolved and pigtail was removed on 28th with collapse of left lung requiring placement of large bore chest tube.  Air leak again resolved, this tube was clamped for 3 days prior to removal.   Pt reports he was feeling fairly well after discharge, coughed hard the morning of 2/8 and felt worsening shortness of breath and had some drainage from the site of prior chest tube.  He saw his oncologist who did outpatient CXR showing re-accumulation of pneumothorax, so was sent to the ED.   He was been stable on room air.  CT surgery consulted by ED and did not think that pt was a surgical candidate and recommended admission for observation.    CXR on 2/9 with stable pneumothorax without any tension component, pt does feel slightly worse this morning, but remains on room air.   PCCM consulted for possible repeat chest tube vs. Pleurx.   Past Medical History:   has a past medical history of Cancer ([place]) and Port-A-Cath in place (05/19/2019).  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 Multiple mass lesions throughout the lungs again noted, largest left upper lobe consistent with known neoplasm. Atelectasis right mid lung. No new opacity evident compared to 1 day prior. Stable cardiac silhouette.   Micro Data:  2/8 Covid.19, flu>>negative   Antimicrobials:   Interim History / Subjective:  Some pain at site, but pain improved after increased dilaudid dose Still small amount of hemoptysis- unchanged over several days Reports he gets choked/ having difficulty swallowing at times   Objective   Blood pressure 115/77, pulse (!) 105, temperature 98.2 F (36.8 C), resp. rate 17, height 5\' 10"  (1.778 m), weight 93 kg, SpO2 93 %.    FiO2 (%):  [21 %] 21 %   Intake/Output Summary (Last 24 hours) at 02/26/2020 1448 Last data filed at 02/26/2020 0900 Gross per 24 hour  Intake 10 ml  Output 1175 ml  Net -1165 ml   Filed Weights   02/20/20 1735  Weight: 93 kg   General:  Chronically ill, pale, older appearing pleasant male lying in bed in NAD HEENT: MM pink/moist Neuro: AOx4, MAE CV: rr, port in right chest accessed PULM:  Non labored, CTA, diminished in bases, left pigtail CT- no airleak or tidaling, remains on RA GI: obese, soft, bs+ Extremities: warm/dry, trace LE edema  Skin: no rashes  Resolved Hospital Problem list    Assessment & Plan:   Recurrent left secondary pneumothorax - s/p talc pleurodesis 2/12 with ongoing pain issues/ coughing - CXR showing small left apical PTX, difficult to know if this is new vs residual.  Will place to waterseal and repeat CXR in am, or sooner if more symptomatic or any changes - continue oxy IR, and dilaudid prn (dose was increased 2/13) -no significant output, 150 ml cumulative since talc pleurodesis (450 in [facility]rium at time of procedure) - Avoid NSAIDs or any antiinflammatory medications s/p talc pleurodesis   Left lower extremity Liposarcoma with metastatic disease to the lung, LLL necrotic mass with recurrent  hydropneumothorax - Diagnosed 2020, follows with [place].  S/p 7 cycles gemcitabine and docetaxel with progression.  Oncology felt that further chemotherapy would not be helpful.   Plan to have Guardant 360 mutation testing at [facility] or [facility].    Hemoptysis -related to lung mets. - no increase in amount, stable at this point.  - continue to hold [facility] - consider embolization if massive hemoptysis - cough suppression ongoing with tessalon, hycodan,  Reported dysphagia- patient reports gets choked with liquids and difficulty swallowing at times  - SLP consult already placed  Goals of Care:  Last date of multidisciplinary goals of care discussion:per primary Family and staff present:  Summary of discussion:  Follow up goals of care discussion due:  Code Status: Full code, pt states he wants to pursue further intervention as needed, continue aggressive care  Labs   CBC: Recent Labs  Lab 02/20/20 1811 02/21/20 0010 02/22/20 0222 02/23/20 0233 02/24/20 0413 02/25/20 0427 02/26/20 0644  WBC 8.5   < > 6.1 5.5 6.4 8.3 13.5*  NEUTROABS 6.3  --   --   --   --   --  13.0*  HGB 14.1   < > 11.0* 11.0* 11.5* 11.4* 11.3*  HCT 43.3   < > 34.9* 32.9* 35.7* 35.6* 33.8*  MCV 98.2   < > 97.8 96.8 97.3 98.9 96.6  PLT 415*   < > 315 297 287 261 262   < > = values in this interval not displayed.    Basic Metabolic Panel: Recent Labs  Lab 02/22/20 0222 02/23/20 0233 02/24/20 0413 02/25/20 0427 02/26/20 0644  NA 138 137 138 137 137  K 3.8 3.5 3.8 3.9 4.7  CL 107 106 107 103 104  CO2 23 24 23 26 24   GLUCOSE 101* 107* 93 122* 170*  BUN 9 9 8 12 13   CREATININE 0.68 0.60* 0.61 0.57* 0.55*  CALCIUM 8.4* 8.3* 8.7* 8.6* 8.9  MG  --   --   --  2.0 2.1  PHOS  --   --   --  4.3 3.5   GFR: Estimated Creatinine Clearance: 112.5 mL/min (A) (by C-G formula based on SCr of 0.55 mg/dL (L)). Recent Labs  Lab 02/23/20 0233 02/24/20 0413 02/25/20 0427 02/26/20 0644  WBC 5.5 6.4 8.3  13.5*    Liver Function Tests: Recent Labs  Lab 02/20/20 1811 02/21/20 0010  AST 43* 40  ALT 32 28  ALKPHOS 133* 118  BILITOT 0.8 0.9  PROT 7.0 6.1*  ALBUMIN 3.4* 2.9*   No results for input(s): LIPASE, AMYLASE in the last 168 hours. No results for input(s): AMMONIA in the last 168 hours.   Kennieth Rad, ACNP [facility] 02/26/2020, 2:48 PM

## 2020-02-26 NOTE — Progress Notes (Signed)
[facility]                                                                              Patient Demographics  Leonard [name], is a 60 y.o. male, DOB - [birth year], TSV:[id number]  Admit date - 02/20/2020   Admitting Physician Elwyn Reach, MD  Outpatient Primary MD for the patient is Celene Squibb, MD  Outpatient specialists:   LOS - 6  days   Medical records reviewed and are as summarized below:    Chief Complaint  Patient presents with  . 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  At that time he had more than 50% left-sided pneumothorax that was spontaneous.  Patient apparently had been doing fine until the morning of admission, when he had a coughing spell, started feeling more short of breath and pain on the left side of the chest.  Patient went to see his oncologist, x-ray showed reaccumulation of pneumothorax.  Patient had hydropneumothorax associated with the necrotic malignant mass. TCTS was consulted by EDP, recommended admission for observation. Chest x-ray showed persistent bilateral airspace opacity in addition to a moderate-sized left sided hydropneumothorax.  Assessment & Plan    Principal Problem:   Spontaneous hydropneumothorax, recurrent in the setting of metastatic disease to the lung, LLL malignant necrotic mass - Very uncomfortable today with pain and diffusely wheezing, O2 sats 99% on room air.  - Status post chest tube placement on 2/9, talc pleurodesis on 2/12 -Wheezing improved, continue IV Solu-Medrol, nebs -Pain currently controlled, continue pain medication regimen.  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  Oncology felt further chemotherapy would not be helpful due to progression of the disease. -high risk of readmissions, recurrent hydropneumothorax, metastatic malignancy.  Palliative care was consulted during previous admission, not seen on 2/1, recommended outpatient palliative to follow -Pain currently controlled    Esophageal dysphagia -Currently on regular diet, states he was having difficulty with water this morning, will have SLP evaluation    Dehydration -Improved, patient was placed on IV fluid hydration  Anxiety, depression -Stable, continue Lexapro, Xanax   Code Status: Full CODE STATUS DVT Prophylaxis:     Level of Care: Level of care: Telemetry Surgical Family Communication: Discussed all imaging results, lab results, explained to the  patient's wife Yuta Cipollone on phone [phone] on 2/13  Disposition Plan:     Status is: Inpatient  Remains inpatient appropriate because:Inpatient level of care appropriate due to severity of illness   Dispo: The patient is from: Home              Anticipated d/c is to: Home              Anticipated d/c date is: >  3 days              Patient currently is not medically stable to d/c.  Currently has chest tube   Difficult to place patient No   Time Spent in minutes   35 minutes  Procedures:  Pigtail catheter placement on 02/21/20, talc pleurodesis on 2/12  Consultants:   TCTS PCCM  Antimicrobials:   Anti-infectives (From admission, onward)   None         Medications  Scheduled Meds: . acetaminophen  1,000 mg Oral TID  . ALPRAZolam  0.25 mg Oral QHS  . benzonatate  200 mg Oral TID  . Chlorhexidine Gluconate Cloth  6 each Topical Daily  . escitalopram  10 mg Oral QHS  . gabapentin  300 mg Oral TID  . ipratropium-albuterol  3 mL Nebulization QID  . lidocaine (PF)  250 mg Other Once  .  methylPREDNISolone (SOLU-MEDROL) injection  60 mg Intravenous Q12H  . polyethylene glycol  17 g Oral Daily  . senna-docusate  1 tablet Oral Q1400  . sodium chloride flush  10 mL Intracatheter Q8H  . sodium chloride flush  10-40 mL Intracatheter Q12H  . zolpidem  10 mg Oral QHS   Continuous Infusions:  PRN Meds:.ALPRAZolam, HYDROcodone-homatropine, HYDROmorphone (DILAUDID) injection, ondansetron **OR** ondansetron (ZOFRAN) IV, oxyCODONE, pantoprazole, prochlorperazine, sodium chloride flush      Subjective:   Leonard [name] was seen and examined today.  Chest pain better controlled today, wheezing improved.  No acute shortness of breath, fevers or chills.  + Constipation.  No vomiting abdominal pain or any diarrhea.  Objective:   Vitals:   02/25/20 2043 02/26/20 0616 02/26/20 0835 02/26/20 1151  BP: 137/70 121/80    Pulse: (!) 118 92    Resp: 18 18    Temp: 98.2 F (36.8 C) 98.4 F (36.9 C)    TempSrc: Oral Oral    SpO2: 95% 93% 95% 94%  Weight:      Height:        Intake/Output Summary (Last 24 hours) at 02/26/2020 1231 Last data filed at 02/26/2020 0900 Gross per 24 hour  Intake 10 ml  Output 1175 ml  Net -1165 ml     Wt Readings from Last 3 Encounters:  02/20/20 93 kg  02/20/20 93 kg  02/16/20 94.4 kg   Physical Exam  General: Alert and oriented x 3, NAD  Cardiovascular: S1 S2 clear, RRR. No pedal edema b/l  Respiratory: decreased breath sound at the bases, + chest tube  Gastrointestinal: Soft, nontender, nondistended, NBS  Ext: no pedal edema bilaterally  Neuro: no new deficits  Musculoskeletal: No cyanosis, clubbing  Skin: No rashes  Psych: Normal affect and demeanor, alert and oriented x3    Data Reviewed:  I have personally reviewed following labs and imaging studies  Micro Results Recent Results (from the past 240 hour(s))  Resp Panel by RT-PCR (Flu A&B, Covid) Nasopharyngeal Swab     Status: None   Collection Time: 02/20/20  6:30 PM    Specimen: Nasopharyngeal Swab; Nasopharyngeal(NP) swabs in vial transport medium  Result Value Ref Range Status   SARS Coronavirus 2 by RT PCR NEGATIVE NEGATIVE Final    Comment: (NOTE) SARS-CoV-2 target nucleic acids are NOT DETECTED.  The SARS-CoV-2 RNA is generally detectable in upper respiratory specimens during the acute phase of infection. The lowest concentration of SARS-CoV-2 viral copies this assay can detect is 138 copies/mL. A negative result does not preclude SARS-Cov-2 infection and should not be  used as the sole basis for treatment or other patient management decisions. A negative result may occur with  improper specimen collection/handling, submission of specimen other than nasopharyngeal swab, presence of viral mutation(s) within the areas targeted by this assay, and inadequate number of viral copies(<138 copies/mL). A negative result must be combined with clinical observations, patient history, and epidemiological information. The expected result is Negative.  Fact Sheet for Patients:  EntrepreneurPulse.com.au  Fact Sheet for Healthcare Providers:  IncredibleEmployment.be  This test is no t yet approved or cleared by the [place] FDA and  has been authorized for detection and/or diagnosis of SARS-CoV-2 by FDA under an Emergency Use Authorization (EUA). This EUA will remain  in effect (meaning this test can be used) for the duration of the COVID-19 declaration under Section 564(b)(1) of the Act, 21 U.S.C.section 360bbb-3(b)(1), unless the authorization is terminated  or revoked sooner.       Influenza A by PCR NEGATIVE NEGATIVE Final   Influenza B by PCR NEGATIVE NEGATIVE Final    Comment: (NOTE) The Xpert Xpress SARS-CoV-2/FLU/RSV plus assay is intended as an aid in the diagnosis of influenza from Nasopharyngeal swab specimens and should not be used as a sole basis for treatment. Nasal washings and aspirates are  unacceptable for Xpert Xpress SARS-CoV-2/FLU/RSV testing.  Fact Sheet for Patients: EntrepreneurPulse.com.au  Fact Sheet for Healthcare Providers: IncredibleEmployment.be  This test is not yet approved or cleared by the [place] FDA and has been authorized for detection and/or diagnosis of SARS-CoV-2 by FDA under an Emergency Use Authorization (EUA). This EUA will remain in effect (meaning this test can be used) for the duration of the COVID-19 declaration under Section 564(b)(1) of the Act, 21 U.S.C. section 360bbb-3(b)(1), unless the authorization is terminated or revoked.  Performed at [facility], [place] [address], [place], [place] [address]     Radiology Reports DG Chest 2 View  Result Date: 02/20/2020 CLINICAL DATA:  Metastatic sarcoma.  Follow-up pneumothorax EXAM: CHEST - 2 VIEW COMPARISON:  02/15/2020 FINDINGS: Progression of left pneumothorax now approximately 30-40%. Small amount of pleural fluid in the left lung base. Gas in the chest wall on the left is unchanged. Multiple bilateral lung nodules most compatible with metastatic disease, unchanged. Port-A-Cath tip SVC unchanged. Small right pleural effusion. IMPRESSION: Interval progression of left pneumothorax approximately 30-40%. These results were called by telephone at the time of interpretation on 02/20/2020 at 3:53 pm to provider [facility] , who verbally acknowledged these results. Electronically Signed   By: Franchot Gallo M.D.   On: 02/20/2020 15:53   DG Chest 2 View  Result Date: 02/15/2020 CLINICAL DATA:  Pneumothorax, recent chest tube removal, history of lung cancer EXAM: CHEST - 2 VIEW COMPARISON:  Radiograph 02/14/2020, CT 02/06/2020 FINDINGS: Accessed right IJ approach Port-A-Cath tip terminates in the lower SVC. Telemetry leads and support devices overlie the chest. Cholecystectomy clips in the right upper quadrant. Persistent subcutaneous emphysema extending  across the left chest wall. Suspect small residual left apical visceral pleural line with additional increasing lucency along the left heart and mediastinal borders which could reflect a pneumomediastinum or less likely medial pneumothorax. Small bilateral effusions. Persistent heterogeneous opacities are again seen in the mid to lower lungs with additional bilateral pulmonary masses, largest in the left lung apex. No acute or worrisome osseous abnormality. IMPRESSION: 1. Suspect small residual left apical visceral pleural line/apical pneumothorax. Additional increasing lucency along the left heart and mediastinal borders which could reflect a pneumomediastinum or  less likely medial pneumothorax component. Small bilateral effusions. 2. Extensive subcutaneous emphysema remains of the left chest wall. 3. Persistent heterogeneous opacities in the mid to lower lungs, favor edema and atelectasis. 4. Redemonstrated pulmonary masses, largest in the left apex. Electronically Signed   By: Lovena Le M.D.   On: 02/15/2020 06:46   DG Chest 2 View  Result Date: 02/14/2020 CLINICAL DATA:  Pneumothorax EXAM: CHEST - 2 VIEW COMPARISON:  Chest x-ray 02/13/2020 FINDINGS: Right chest wall Port-A-Cath and left chest tube in stable appropriate position. The heart size and mediastinal contours are within normal limits. Persistent small apical left pneumothorax. Persistent bilateral pulmonary masses again noted. Possible trace right pleural effusion. No definite left pleural effusion. No acute osseous abnormality. IMPRESSION: 1. Grossly stable small left apical pneumothorax. 2. Trace right pleural effusion. 3. Persistent bilateral pulmonary masses. 4. Lines and tubes in stable position. Electronically Signed   By: Iven Finn M.D.   On: 02/14/2020 06:54   DG Chest 2 View  Result Date: 02/13/2020 CLINICAL DATA:  Chest tube. EXAM: CHEST - 2 VIEW COMPARISON:  02/12/2020. FINDINGS: Left chest tube in stable position. Left  pneumothorax improved from prior exam with tiny residual left apical pneumothorax. Persistent bilateral pulmonary masses again noted. Small right pleural effusion. Heart size stable. Degenerative change thoracic spine. IMPRESSION: 1. Left chest tube in stable position. Left pneumothorax improved from prior exam with tiny residual left apical pneumothorax. 2. Persistent bilateral pulmonary masses again noted. Small right pleural effusion. Electronically Signed   By: Marcello Moores  Register   On: 02/13/2020 07:00   CT CHEST ABDOMEN PELVIS W CONTRAST  Result Date: 02/06/2020 CLINICAL DATA:  60 year old male with history of liposarcoma in the left lower extremity. EXAM: CT CHEST, ABDOMEN, AND PELVIS WITH CONTRAST TECHNIQUE: Multidetector CT imaging of the chest, abdomen and pelvis was performed following the standard protocol during bolus administration of intravenous contrast. CONTRAST:  173mL OMNIPAQUE IOHEXOL 300 MG/ML  SOLN COMPARISON:  CT the chest, abdomen and pelvis 11/01/2019. FINDINGS: CT CHEST FINDINGS Cardiovascular: Heart size is normal. Trace amount of pericardial fluid and/or thickening, unlikely to be of hemodynamic significance at this time. No associated pericardial calcification. There is aortic atherosclerosis, as well as atherosclerosis of the great vessels of the mediastinum and the coronary arteries, including calcified atherosclerotic plaque in the left main, left anterior descending and right coronary arteries.Right internal jugular single-lumen porta cath with tip terminating at the superior cavoatrial junction. Mediastinum/Nodes: No pathologically enlarged mediastinal or hilar lymph nodes. Esophagus is unremarkable in appearance. No axillary lymphadenopathy. Lungs/Pleura: Multiple large pulmonary nodules and masses are again noted in the lungs bilaterally, generally increased in size compared to the prior examination. The largest of these is in the left upper lobe near the apex (axial image 17 of  series 2) measuring 5.5 x 5.6 cm, significantly increased from 2.1 x 1.8 cm on the prior examination. This lesion is centrally low-attenuation, suggesting internal areas of necrosis. Small bilateral pleural effusions. New large left pneumothorax with considerable atelectasis in the left lung. Musculoskeletal: There are no aggressive appearing lytic or blastic lesions noted in the visualized portions of the skeleton. CT ABDOMEN PELVIS FINDINGS Hepatobiliary: No suspicious cystic or solid hepatic lesions. No intra or extrahepatic biliary ductal dilatation. Status post cholecystectomy. Pancreas: No pancreatic mass. No pancreatic ductal dilatation. No pancreatic or peripancreatic fluid collections or inflammatory changes. Spleen: Unremarkable. Adrenals/Urinary Tract: In the upper pole the left kidney there is enhancing lesion measuring 1.7 cm in diameter, increased in  size compared to the prior study (previously 1.2 cm), concerning for neoplasm. Exophytic 1.2 cm low-attenuation lesion in the lower pole the left kidney, compatible with a small simple cyst. Right kidney and bilateral adrenal glands are normal in appearance. No hydroureteronephrosis. Urinary bladder is normal in appearance. Stomach/Bowel: The appearance of the stomach is normal. There is no pathologic dilatation of small bowel or colon. Normal appendix. Vascular/Lymphatic: Aortic atherosclerosis, without evidence of aneurysm or dissection in the abdominal or pelvic vasculature. No lymphadenopathy noted in the abdomen or pelvis. Reproductive: Prostate gland and seminal vesicles are unremarkable in appearance. Other: No significant volume of ascites.  No pneumoperitoneum. Musculoskeletal: There are no aggressive appearing lytic or blastic lesions noted in the visualized portions of the skeleton. IMPRESSION: 1. New large left hydropneumothorax. 2. Widespread metastatic disease to the lungs appears progressive compared to the prior study, as above. 3.  Enlarging probably enhancing lesion in the upper pole the left kidney concerning for neoplasm, either metastatic or primary. 4. Small right pleural effusion. 5. Aortic atherosclerosis, in addition to left main and 2 vessel coronary artery disease. Please note that although the presence of coronary artery calcium documents the presence of coronary artery disease, the severity of this disease and any potential stenosis cannot be assessed on this non-gated CT examination. Assessment for potential risk factor modification, dietary therapy or pharmacologic therapy may be warranted, if clinically indicated. 6. Additional incidental findings, as above. Critical Value/emergent results were called by telephone at the time of interpretation on 02/06/2020 at 11:35 am to provider [facility] , who verbally acknowledged these results. Electronically Signed   By: Vinnie Langton M.D.   On: 02/06/2020 11:36   DG CHEST PORT 1 VIEW  Result Date: 02/26/2020 CLINICAL DATA:  Shortness of breath. Chest tube present. Known liposarcoma EXAM: PORTABLE CHEST 1 VIEW COMPARISON:  February 25, 2020. FINDINGS: Chest tube position the left is stable. Trace pneumothorax noted on the left. Port-A-Cath tip is at the cavoatrial junction. Multiple mass lesions remain. Left base and right midlung atelectatic changes are stable. Heart is upper normal in size with pulmonary vascularity normal. No adenopathy is appreciable by radiography. No bone lesions. IMPRESSION: Chest tube on the left with trace apical pneumothorax on the left. Multiple mass lesions remain. Areas of atelectatic change are essentially stable. Stable cardiac silhouette. Electronically Signed   By: Lowella Grip III M.D.   On: 02/26/2020 08:10   DG Chest Port 1 View  Result Date: 02/25/2020 CLINICAL DATA:  Chest tube present EXAM: PORTABLE CHEST 1 VIEW COMPARISON:  February 24, 2020. FINDINGS: Chest tube position on the left is unchanged. Port-A-Cath tip is at the  cavoatrial junction, stable. Subcutaneous air remains on the left. No pneumothorax evident. Multiple mass lesions again noted consistent with known metastatic foci. Atelectatic change stable in left base and right mid lung regions. New mild atelectatic change left mid lung. Heart upper normal in size, stable, with pulmonary vascularity normal. No appreciable adenopathy. IMPRESSION: Stable tube and catheter positions. No pneumothorax. Metastatic disease again noted. Areas of atelectatic change in the right mid lung and left base noted. New mild atelectasis left mid lung. Stable cardiac silhouette. Electronically Signed   By: Lowella Grip III M.D.   On: 02/25/2020 10:09   DG Chest Port 1 View  Result Date: 02/24/2020 CLINICAL DATA:  Liposarcoma. Chest tube in place for recent pneumothorax EXAM: PORTABLE CHEST 1 VIEW COMPARISON:  February 23, 2020 FINDINGS: Chest tube remains on the left, unchanged in position.  There is subcutaneous air on the left is again noted. Port-A-Cath tip is in the superior vena cava near the cavoatrial junction. Currently there is no appreciable pneumothorax. Multiple mass lesions remain, largest in left upper lobe, consistent with metastatic foci. Atelectatic change noted in left base and right mid lung. Heart upper normal in size with pulmonary vascularity normal. No adenopathy appreciable by radiography. Degenerative change noted in each shoulder. IMPRESSION: Tube and catheter positions as described without appreciable pneumothorax. Pulmonary metastases again noted without change. There is left base and right midlung atelectatic change. No consolidation. Stable cardiac silhouette. Electronically Signed   By: Lowella Grip III M.D.   On: 02/24/2020 09:08   DG Chest Port 1 View  Result Date: 02/23/2020 CLINICAL DATA:  60 year old male with metastatic sarcoma. Pneumothorax, chest tube. EXAM: PORTABLE CHEST 1 VIEW COMPARISON:  Portable chest 02/22/2020 and earlier. FINDINGS:  Stable left chest tube. Small left pneumothorax has decreased, trace residual with pleural edge visible laterally. Small volume left chest wall gas has decreased. Underlying pulmonary metastatic disease, stable bilateral pulmonary masses. Stable cardiac size and mediastinal contours. Stable right chest power port. Visualized tracheal air column is within normal limits. No new pulmonary opacity. Stable visualized osseous structures. IMPRESSION: 1. Stable left chest tube. Decreased and now trace left pneumothorax. 2. Stable pulmonary metastatic disease. Electronically Signed   By: Genevie Ann M.D.   On: 02/23/2020 06:34   DG CHEST PORT 1 VIEW  Result Date: 02/22/2020 CLINICAL DATA:  Follow-up chest tube EXAM: PORTABLE CHEST 1 VIEW COMPARISON:  02/21/2020 FINDINGS: Cardiac shadow is enlarged but stable. Right chest wall port is again seen. Bilateral metastatic lesions are again identified and stable. Pigtail catheter is noted on the left. Small pneumothorax is again seen. No new focal abnormality is noted. IMPRESSION: Stable small left pneumothorax. Multifocal pulmonary metastatic disease as described. Electronically Signed   By: Inez Catalina M.D.   On: 02/22/2020 10:06   DG Chest Portable 1 View  Result Date: 02/21/2020 CLINICAL DATA:  Chest tube placement for pneumothorax EXAM: PORTABLE CHEST 1 VIEW COMPARISON:  February 21, 2020 study obtained earlier in the day FINDINGS: Chest tube now present on the left with tip directed medially. Small residual lateral pneumothorax without tension component. Subcutaneous air noted on the left. Multiple mass lesions again noted throughout the lungs, largest left upper lobe region. Atelectatic change again noted in right mid and lower lung regions. Heart size and pulmonary vascularity are normal. No adenopathy evident. Port-A-Cath tip in superior vena cava. IMPRESSION: Left-sided pneumothorax much smaller after chest tube placement. No tension component. Multiple mass lesions  consistent with known liposarcoma. Atelectatic change again noted on the right. Stable cardiac silhouette. Electronically Signed   By: Lowella Grip III M.D.   On: 02/21/2020 11:25   DG CHEST PORT 1 VIEW  Result Date: 02/21/2020 CLINICAL DATA:  Known pneumothorax. History of lower extremity liposarcoma EXAM: PORTABLE CHEST 1 VIEW COMPARISON:  February 20, 2020 chest radiograph and chest CT February 06, 2020 FINDINGS: Previously noted pneumothorax on the left is unchanged without appreciable tension component. Subcutaneous air noted along the lateral left hemithorax. Multiple mass lesions throughout the lungs, largest in the left upper lobe, remain without change. Atelectatic change noted in right mid lung. No new opacity evident. Heart is upper normal in size with pulmonary vascularity within normal limits. No adenopathy evident. Port-A-Cath tip in superior vena cava. No bone lesions. IMPRESSION: Stable left-sided pneumothorax that tension component. Multiple mass lesions throughout the lungs  again noted, largest left upper lobe consistent with known neoplasm. Atelectasis right mid lung. No new opacity evident compared to 1 day prior. Stable cardiac silhouette. Electronically Signed   By: Lowella Grip III M.D.   On: 02/21/2020 08:13   DG Chest Port 1 View  Result Date: 02/20/2020 CLINICAL DATA:  Pneumothorax follow-up EXAM: PORTABLE CHEST 1 VIEW COMPARISON:  February 20, 2020 FINDINGS: There is a persistent unchanged left-sided pneumothorax. There is subcutaneous gas along the patient's left flank. There are airspace opacities involving the left upper lung zone and right lung base. There is a well-positioned right-sided Port-A-Cath. No definite right-sided pneumothorax. There is a small right-sided pleural effusion. IMPRESSION: No significant interval change. Persistent bilateral airspace opacities in addition to a moderate-sized left-sided pneumothorax. Electronically Signed   By: Constance Holster  M.D.   On: 02/20/2020 18:47   DG Chest Port 1 View  Result Date: 02/12/2020 CLINICAL DATA:  60 year old male with history of pneumothorax status post chest tube placement. EXAM: PORTABLE CHEST 1 VIEW COMPARISON:  Chest x-ray 02/09/2020. FINDINGS: Right internal jugular single-lumen power porta cath with tip terminating at the superior cavoatrial junction. Left-sided chest tube in position with tip and side port projecting over the lower left hemithorax. Small persistent left pneumothorax, increased slightly in size compared to the prior study. No right-sided pneumothorax. Multiple pulmonary nodules and masses again noted, better demonstrated on prior chest CT 02/06/2020. No definite acute consolidative airspace disease. No pleural effusions. No evidence of pulmonary edema. Heart size is normal. Upper mediastinal contours are within normal limits. Extensive subcutaneous emphysema in the left chest wall again noted. IMPRESSION: 1. Support apparatus, as above. 2. Increased size of small left-sided pneumothorax. 3. Multiple pulmonary nodules and masses, similar to prior examinations, presumably reflective of metastatic disease. Electronically Signed   By: Vinnie Langton M.D.   On: 02/12/2020 08:16   DG CHEST PORT 1 VIEW  Result Date: 02/09/2020 CLINICAL DATA:  Follow-up hydropneumothorax. Status post chest tube removal. EXAM: PORTABLE CHEST 1 VIEW COMPARISON:  02/09/2020 FINDINGS: Right chest wall port a catheter is noted with tip at the cavoatrial junction. The left-sided chest tube has been removed. Small left apical pneumothorax persists. Unchanged appearance of gas within the soft tissues of the left chest wall. Masslike opacities within the left upper lobe and right base are unchanged. IMPRESSION: Persistent small left apical pneumothorax status post left-sided chest tube removal. Electronically Signed   By: Kerby Moors M.D.   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 Recent left pneumothorax. EXAM: PORTABLE CHEST 1 VIEW COMPARISON:  Portable chest 02/08/2020 and earlier. FINDINGS: Portable AP upright view at 0816 hours. Left chest tube is in place although there may be side holes outside of the left pleural space (arrow). A small volume of left chest wall gas has increased. Small residual left pneumothorax. No mediastinal shift. Masslike bilateral pulmonary opacities compatible with known metastases. Stable cardiac size and mediastinal contours. Visualized tracheal air column is within normal limits. Stable right chest porta cath. Negative visible bowel gas pattern. Stable visualized osseous structures. IMPRESSION: 1. Left chest tube in place although there may be tube side holes outside of the pleural space. A small volume of chest wall gas has increased. 2. Stable small residual pneumothorax. 3. Stable pulmonary metastases. Electronically Signed   By: Genevie Ann M.D.   On: 02/09/2020 08:25   DG CHEST PORT 1 VIEW  Result Date: 02/08/2020 CLINICAL DATA:  Hydropneumothorax, chest tube on LEFT EXAM: PORTABLE CHEST 1 VIEW COMPARISON:  Portable exam 7673 hours compared to 02/07/2020 FINDINGS: Pigtail RIGHT thoracostomy tube, partially withdrawn since previous exam with tip projecting at LEFT costal margin. Persistent small LEFT apex pneumothorax. Basilar LEFT pleural effusion. Normal heart size mediastinal contours. RIGHT jugular Port-A-Cath stable tip projecting over SVC. Atelectasis versus infiltrate at RIGHT base. BILATERAL pulmonary nodules, largest a mass at LEFT apex. Bones demineralized. IMPRESSION: Persistent small LEFT hydropneumothorax. LEFT thoracostomy tube has been partially withdrawn with the pigtail now at LEFT costal margin. Electronically Signed   By: Lavonia Dana M.D.   On: 02/08/2020 15:11   DG Chest Portable 1 View  Result Date: 02/07/2020 CLINICAL DATA:  Follow-up left pneumothorax.  Lung  carcinoma. EXAM: PORTABLE CHEST 1 VIEW COMPARISON:  Prior today FINDINGS: There has been placement of a left pleural pigtail catheter since previous study, with near complete resolution of left pneumothorax since prior exam. Multiple pulmonary nodules and masses are again seen in both lungs, largest in the left upper lobe and right lower lobe. Small right pleural effusion remains stable. Right-sided Port-A-Cath remains in appropriate position. IMPRESSION: Near complete resolution of left pneumothorax following left pleural catheter placement. Electronically Signed   By: Marlaine Hind M.D.   On: 02/07/2020 14:55   DG Chest Portable 1 View  Result Date: 02/07/2020 CLINICAL DATA:  Left pneumothorax. EXAM: PORTABLE CHEST 1 VIEW COMPARISON:  CT 02/06/2020. FINDINGS: Mediastinum hilar structures normal. Borderline cardiomegaly. Large left pneumothorax noted. Similar finding noted on prior CT. Prominent atelectasis left lung. Multiple prominent bilateral pulmonary masses again noted as noted on prior CT. No acute bony abnormality identified. Contrast in the colon. IMPRESSION: 1. Large left pneumothorax. Similar finding noted on prior CT. Prominent atelectasis left lung. 2. Multiple prominent bilateral pulmonary masses again noted as noted on prior CT. Electronically Signed   By: Marcello Moores  Register   On: 02/07/2020 13:50   DG Chest Port 1V same Day  Result Date: 02/09/2020 CLINICAL DATA:  Shortness of breath, history of metastatic lung carcinoma EXAM: PORTABLE CHEST 1 VIEW COMPARISON:  Film from earlier in the same day. FINDINGS: Cardiac shadow is stable. Left-sided chest tube is again identified. Small left apical pneumothorax is again identified stable in appearance. Stable left upper lobe mass lesion is noted. Nodules are noted in the right lung as well as patchy atelectatic changes in the bases bilaterally. Considerable subcutaneous emphysema is noted. No bony abnormality is seen. Right chest wall port is noted.  IMPRESSION: Stable left apical pneumothorax with chest tube in place. Changes of metastatic disease within the lungs. Electronically Signed   By: Inez Catalina M.D.   On: 02/09/2020 23:23   DG Chest Port 1V same Day  Result Date: 02/09/2020 CLINICAL DATA:  Pneumothorax.  Chest tube placement. EXAM: PORTABLE CHEST 1 VIEW COMPARISON:  February 09, 2020 FINDINGS: There has been interval placement of a left-sided chest tube. There is a small residual left-sided pneumothorax, improved from prior study. The right-sided Port-A-Cath is well position. Bilateral airspace opacities are again noted, not substantially changed from prior study. There is subcutaneous emphysema along the patient's left flank, stable to improved from prior study. The heart size is unchanged. IMPRESSION: 1. Interval placement of a left-sided chest tube. 2. Small residual left-sided pneumothorax, improved from prior study. 3. Otherwise, no significant short interval change. Electronically Signed   By: Constance Holster M.D.   On: 02/09/2020 16:25   DG Chest  Port 1V same Day  Result Date: 02/09/2020 CLINICAL DATA:  Chest tube removal EXAM: PORTABLE CHEST 1 VIEW COMPARISON:  02/09/2020 FINDINGS: Further increased size of the left pneumothorax, now noted laterally and at the left apex, approximately 15%. Right Port-A-Cath remains in place, unchanged. Airspace opacity at the right lung base is unchanged. Heart is borderline in size. Subcutaneous emphysema throughout the left chest wall. IMPRESSION: Enlarging left pneumothorax, now approximately 15%. Otherwise no change. These results will be called to the ordering clinician or representative by the Radiologist Assistant, and communication documented in the PACS or [employer]. Electronically Signed   By: Rolm Baptise M.D.   On: 02/09/2020 15:33    Lab Data:  CBC: Recent Labs  Lab 02/20/20 1811 02/21/20 0010 02/22/20 0222 02/23/20 0233 02/24/20 0413 02/25/20 0427 02/26/20 0644   WBC 8.5   < > 6.1 5.5 6.4 8.3 13.5*  NEUTROABS 6.3  --   --   --   --   --  13.0*  HGB 14.1   < > 11.0* 11.0* 11.5* 11.4* 11.3*  HCT 43.3   < > 34.9* 32.9* 35.7* 35.6* 33.8*  MCV 98.2   < > 97.8 96.8 97.3 98.9 96.6  PLT 415*   < > 315 297 287 261 262   < > = values in this interval not displayed.   Basic Metabolic Panel: Recent Labs  Lab 02/22/20 0222 02/23/20 0233 02/24/20 0413 02/25/20 0427 02/26/20 0644  NA 138 137 138 137 137  K 3.8 3.5 3.8 3.9 4.7  CL 107 106 107 103 104  CO2 23 24 23 26 24   GLUCOSE 101* 107* 93 122* 170*  BUN 9 9 8 12 13   CREATININE 0.68 0.60* 0.61 0.57* 0.55*  CALCIUM 8.4* 8.3* 8.7* 8.6* 8.9  MG  --   --   --  2.0 2.1  PHOS  --   --   --  4.3 3.5   GFR: Estimated Creatinine Clearance: 112.5 mL/min (A) (by C-G formula based on SCr of 0.55 mg/dL (L)). Liver Function Tests: Recent Labs  Lab 02/20/20 1811 02/21/20 0010  AST 43* 40  ALT 32 28  ALKPHOS 133* 118  BILITOT 0.8 0.9  PROT 7.0 6.1*  ALBUMIN 3.4* 2.9*   No results for input(s): LIPASE, AMYLASE in the last 168 hours. No results for input(s): AMMONIA in the last 168 hours. Coagulation Profile: Recent Labs  Lab 02/20/20 1811  INR 1.1   Cardiac Enzymes: No results for input(s): CKTOTAL, CKMB, CKMBINDEX, TROPONINI in the last 168 hours. BNP (last 3 results) No results for input(s): PROBNP in the last 8760 hours. HbA1C: No results for input(s): HGBA1C in the last 72 hours. CBG: No results for input(s): GLUCAP in the last 168 hours. Lipid Profile: No results for input(s): CHOL, HDL, LDLCALC, TRIG, CHOLHDL, LDLDIRECT in the last 72 hours. Thyroid Function Tests: No results for input(s): TSH, T4TOTAL, FREET4, T3FREE, THYROIDAB in the last 72 hours. Anemia Panel: No results for input(s): VITAMINB12, FOLATE, FERRITIN, TIBC, IRON, RETICCTPCT in the last 72 hours. Urine analysis: No results found for: COLORURINE, APPEARANCEUR, LABSPEC, PHURINE, GLUCOSEU, HGBUR, BILIRUBINUR, KETONESUR,  PROTEINUR, UROBILINOGEN, NITRITE, LEUKOCYTESUR   Meosha Castanon M.D. [facility] 02/26/2020, 12:31 PM   Call night coverage person covering after 7pm

## 2020-02-27 ENCOUNTER — Inpatient Hospital Stay (HOSPITAL_COMMUNITY): Payer: 59

## 2020-02-27 DIAGNOSIS — E860 Dehydration: Secondary | ICD-10-CM | POA: Diagnosis not present

## 2020-02-27 DIAGNOSIS — R042 Hemoptysis: Secondary | ICD-10-CM | POA: Diagnosis not present

## 2020-02-27 DIAGNOSIS — Z9689 Presence of other specified functional implants: Secondary | ICD-10-CM | POA: Diagnosis not present

## 2020-02-27 DIAGNOSIS — J9383 Other pneumothorax: Secondary | ICD-10-CM | POA: Diagnosis not present

## 2020-02-27 DIAGNOSIS — R0600 Dyspnea, unspecified: Secondary | ICD-10-CM | POA: Diagnosis not present

## 2020-02-27 LAB — BASIC METABOLIC PANEL
Anion gap: 6 (ref 5–15)
BUN: 17 mg/dL (ref 6–20)
CO2: 25 mmol/L (ref 22–32)
Calcium: 8.8 mg/dL — ABNORMAL LOW (ref 8.9–10.3)
Chloride: 104 mmol/L (ref 98–111)
Creatinine, Ser: 0.66 mg/dL (ref 0.61–1.24)
GFR, Estimated: >60 mL/min (ref >60)
Glucose, Bld: 150 mg/dL — ABNORMAL HIGH (ref 70–99)
Potassium: 4.4 mmol/L (ref 3.5–5.1)
Sodium: 135 mmol/L (ref 135–145)

## 2020-02-27 LAB — CBC
HCT: 33.1 % — ABNORMAL LOW (ref 39.0–52.0)
Hemoglobin: 10.7 g/dL — ABNORMAL LOW (ref 13.0–17.0)
MCH: 31.8 pg (ref 26.0–34.0)
MCHC: 32.3 g/dL (ref 30.0–36.0)
MCV: 98.2 fL (ref 80.0–100.0)
Platelets: 268 10*3/uL (ref 150–400)
RBC: 3.37 MIL/uL — ABNORMAL LOW (ref 4.22–5.81)
RDW: 16.1 % — ABNORMAL HIGH (ref 11.5–15.5)
WBC: 16.4 10*3/uL — ABNORMAL HIGH (ref 4.0–10.5)
nRBC: 0 % (ref 0.0–0.2)

## 2020-02-27 NOTE — Progress Notes (Signed)
[facility]                                                                              Patient Demographics  Leonard [name], is a 60 y.o. male, DOB - October 22, 1960, CBJ:[id number]  Admit date - 02/20/2020   Admitting Physician Elwyn Reach, MD  Outpatient Primary MD for the patient is Celene Squibb, MD  Outpatient specialists:   LOS - 7  days   Medical records reviewed and are as summarized below:    Chief Complaint  Patient presents with  . 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  At that time he had more than 50% left-sided pneumothorax that was spontaneous.  Patient apparently had been doing fine until the morning of admission, when he had a coughing spell, started feeling more short of breath and pain on the left side of the chest.  Patient went to see his oncologist, x-ray showed reaccumulation of pneumothorax.  Patient had hydropneumothorax associated with the necrotic malignant mass. TCTS was consulted by EDP, recommended admission for observation. Chest x-ray showed persistent bilateral airspace opacity in addition to a moderate-sized left sided hydropneumothorax.  Chest tube placed on 2/9, talc pleurodesis on 2/12.  Plan to remove chest tube today Hopefully DC home in a.m. if pain is controlled  Assessment & Plan    Principal Problem:   Spontaneous hydropneumothorax, recurrent in the setting of metastatic disease to the lung, LLL malignant necrotic mass - Very uncomfortable today with pain and diffusely wheezing, O2 sats 99% on room air.  - Status post chest tube placement on 2/9, talc pleurodesis on 2/12 -Scant hemoptysis on 2/13, resolved -PCCM removing chest tube today, hoping pain will improve after chest tube removal -Start PT  OT, out of bed to chair, increase mobility.  Home O2 evaluation -d/w patient regarding disposition, requested to go home in the morning if pain is controlled.   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  Oncology felt further chemotherapy would not be helpful due to progression of the disease. -high risk of readmissions, recurrent hydropneumothorax, metastatic malignancy.  Palliative care was consulted during previous admission, not seen on 2/1, recommended outpatient palliative to follow     Esophageal dysphagia -Patient complaining of having difficulty with water (choking on liquids ) and difficulty swallowing -will obtain SLP eval    Dehydration -Improved, patient was placed on IV fluid hydration  Anxiety, depression -Stable, continue Lexapro, Xanax   Code Status: Full CODE STATUS DVT Prophylaxis:     Level of Care: Level of care: Telemetry Surgical Family Communication: Discussed all imaging results, lab results, explained to the  patient's wife Lc Joynt on phone [phone] today  Disposition Plan:     Status is: Inpatient  Remains inpatient appropriate because:Inpatient level of care  appropriate due to severity of illness   Dispo: The patient is from: Home              Anticipated d/c is to: Home              Anticipated d/c date is: > 3 days              Patient currently is not medically stable to d/c.  Pending SLP eval, PT eval, home O2 eval, likely DC home in a.m. if pain is controlled   Difficult to place patient No   Time Spent in minutes   25 minutes  Procedures:  Pigtail catheter placement on 02/21/20, talc pleurodesis on 2/12  Consultants:   TCTS PCCM  Antimicrobials:   Anti-infectives (From admission, onward)   None         Medications  Scheduled Meds: . ALPRAZolam  0.25 mg Oral QHS  . benzonatate   200 mg Oral TID  . Chlorhexidine Gluconate Cloth  6 each Topical Daily  . docusate sodium  100 mg Oral BID  . escitalopram  10 mg Oral QHS  . gabapentin  300 mg Oral TID  . ipratropium-albuterol  3 mL Nebulization QID  . lidocaine (PF)  250 mg Other Once  . pantoprazole  40 mg Oral BID AC  . polyethylene glycol  17 g Oral Daily  . senna-docusate  1 tablet Oral Q1400  . sodium chloride flush  10-40 mL Intracatheter Q12H  . zolpidem  10 mg Oral QHS   Continuous Infusions:  PRN Meds:.ALPRAZolam, HYDROcodone-homatropine, ondansetron **OR** ondansetron (ZOFRAN) IV, oxyCODONE, prochlorperazine, sodium chloride flush      Subjective:   Clavin Westrich was seen and examined today.  Complaining of chest pain with movement and coughing.  No abdominal pain, fevers or chills.  No nausea or vomiting.  Objective:   Vitals:   02/27/20 0500 02/27/20 0549 02/27/20 0911 02/27/20 1249  BP:  110/60    Pulse:  100  91  Resp:  18  18  Temp:  97.8 F (36.6 C)    TempSrc:  Oral    SpO2:  96% 97% 98%  Weight: 94.7 kg     Height:        Intake/Output Summary (Last 24 hours) at 02/27/2020 1335 Last data filed at 02/27/2020 0500 Gross per 24 hour  Intake --  Output 650 ml  Net -650 ml     Wt Readings from Last 3 Encounters:  02/27/20 94.7 kg  02/20/20 93 kg  02/16/20 94.4 kg    Physical Exam  General: Alert and oriented x 3, NAD  Cardiovascular: S1 S2 clear, RRR. No pedal edema b/l  Respiratory: Diminished breath sound at the bases+ chest tube on my encounter  Gastrointestinal: Soft, nontender, nondistended, NBS  Ext: no pedal edema bilaterally  Neuro: no new deficits  Musculoskeletal: No cyanosis, clubbing  Skin: No rashes  Psych: Normal affect and demeanor, alert and oriented x3    Data Reviewed:  I have personally reviewed following labs and imaging studies  Micro Results Recent Results (from the past 240 hour(s))  Resp Panel by RT-PCR (Flu A&B, Covid) Nasopharyngeal  Swab     Status: None   Collection Time: 02/20/20  6:30 PM   Specimen: Nasopharyngeal Swab; Nasopharyngeal(NP) swabs in vial transport medium  Result Value Ref Range Status   SARS Coronavirus 2 by RT PCR NEGATIVE NEGATIVE Final    Comment: (NOTE) SARS-CoV-2 target nucleic acids are NOT DETECTED.  The  SARS-CoV-2 RNA is generally detectable in upper respiratory specimens during the acute phase of infection. The lowest concentration of SARS-CoV-2 viral copies this assay can detect is 138 copies/mL. A negative result does not preclude SARS-Cov-2 infection and should not be used as the sole basis for treatment or other patient management decisions. A negative result may occur with  improper specimen collection/handling, submission of specimen other than nasopharyngeal swab, presence of viral mutation(s) within the areas targeted by this assay, and inadequate number of viral copies(<138 copies/mL). A negative result must be combined with clinical observations, patient history, and epidemiological information. The expected result is Negative.  Fact Sheet for Patients:  EntrepreneurPulse.com.au  Fact Sheet for Healthcare Providers:  IncredibleEmployment.be  This test is no t yet approved or cleared by the [place] FDA and  has been authorized for detection and/or diagnosis of SARS-CoV-2 by FDA under an Emergency Use Authorization (EUA). This EUA will remain  in effect (meaning this test can be used) for the duration of the COVID-19 declaration under Section 564(b)(1) of the Act, 21 U.S.C.section 360bbb-3(b)(1), unless the authorization is terminated  or revoked sooner.       Influenza A by PCR NEGATIVE NEGATIVE Final   Influenza B by PCR NEGATIVE NEGATIVE Final    Comment: (NOTE) The Xpert Xpress SARS-CoV-2/FLU/RSV plus assay is intended as an aid in the diagnosis of influenza from Nasopharyngeal swab specimens and should not be used as a sole  basis for treatment. Nasal washings and aspirates are unacceptable for Xpert Xpress SARS-CoV-2/FLU/RSV testing.  Fact Sheet for Patients: EntrepreneurPulse.com.au  Fact Sheet for Healthcare Providers: IncredibleEmployment.be  This test is not yet approved or cleared by the [place] FDA and has been authorized for detection and/or diagnosis of SARS-CoV-2 by FDA under an Emergency Use Authorization (EUA). This EUA will remain in effect (meaning this test can be used) for the duration of the COVID-19 declaration under Section 564(b)(1) of the Act, 21 U.S.C. section 360bbb-3(b)(1), unless the authorization is terminated or revoked.  Performed at [facility], [place] [address] [place], [place] [address]     Radiology Reports DG Chest 2 View  Result Date: 02/20/2020 CLINICAL DATA:  Metastatic sarcoma.  Follow-up pneumothorax EXAM: CHEST - 2 VIEW COMPARISON:  02/15/2020 FINDINGS: Progression of left pneumothorax now approximately 30-40%. Small amount of pleural fluid in the left lung base. Gas in the chest wall on the left is unchanged. Multiple bilateral lung nodules most compatible with metastatic disease, unchanged. Port-A-Cath tip SVC unchanged. Small right pleural effusion. IMPRESSION: Interval progression of left pneumothorax approximately 30-40%. These results were called by telephone at the time of interpretation on 02/20/2020 at 3:53 pm to provider [facility] , who verbally acknowledged these results. Electronically Signed   By: Franchot Gallo M.D.   On: 02/20/2020 15:53   DG Chest 2 View  Result Date: 02/15/2020 CLINICAL DATA:  Pneumothorax, recent chest tube removal, history of lung cancer EXAM: CHEST - 2 VIEW COMPARISON:  Radiograph 02/14/2020, CT 02/06/2020 FINDINGS: Accessed right IJ approach Port-A-Cath tip terminates in the lower SVC. Telemetry leads and support devices overlie the chest. Cholecystectomy clips in the right upper  quadrant. Persistent subcutaneous emphysema extending across the left chest wall. Suspect small residual left apical visceral pleural line with additional increasing lucency along the left heart and mediastinal borders which could reflect a pneumomediastinum or less likely medial pneumothorax. Small bilateral effusions. Persistent heterogeneous opacities are again seen in the mid to lower lungs with additional bilateral  pulmonary masses, largest in the left lung apex. No acute or worrisome osseous abnormality. IMPRESSION: 1. Suspect small residual left apical visceral pleural line/apical pneumothorax. Additional increasing lucency along the left heart and mediastinal borders which could reflect a pneumomediastinum or less likely medial pneumothorax component. Small bilateral effusions. 2. Extensive subcutaneous emphysema remains of the left chest wall. 3. Persistent heterogeneous opacities in the mid to lower lungs, favor edema and atelectasis. 4. Redemonstrated pulmonary masses, largest in the left apex. Electronically Signed   By: Lovena Le M.D.   On: 02/15/2020 06:46   DG Chest 2 View  Result Date: 02/14/2020 CLINICAL DATA:  Pneumothorax EXAM: CHEST - 2 VIEW COMPARISON:  Chest x-ray 02/13/2020 FINDINGS: Right chest wall Port-A-Cath and left chest tube in stable appropriate position. The heart size and mediastinal contours are within normal limits. Persistent small apical left pneumothorax. Persistent bilateral pulmonary masses again noted. Possible trace right pleural effusion. No definite left pleural effusion. No acute osseous abnormality. IMPRESSION: 1. Grossly stable small left apical pneumothorax. 2. Trace right pleural effusion. 3. Persistent bilateral pulmonary masses. 4. Lines and tubes in stable position. Electronically Signed   By: Iven Finn M.D.   On: 02/14/2020 06:54   DG Chest 2 View  Result Date: 02/13/2020 CLINICAL DATA:  Chest tube. EXAM: CHEST - 2 VIEW COMPARISON:  02/12/2020.  FINDINGS: Left chest tube in stable position. Left pneumothorax improved from prior exam with tiny residual left apical pneumothorax. Persistent bilateral pulmonary masses again noted. Small right pleural effusion. Heart size stable. Degenerative change thoracic spine. IMPRESSION: 1. Left chest tube in stable position. Left pneumothorax improved from prior exam with tiny residual left apical pneumothorax. 2. Persistent bilateral pulmonary masses again noted. Small right pleural effusion. Electronically Signed   By: Marcello Moores  Register   On: 02/13/2020 07:00   CT CHEST ABDOMEN PELVIS W CONTRAST  Result Date: 02/06/2020 CLINICAL DATA:  60 year old male with history of liposarcoma in the left lower extremity. EXAM: CT CHEST, ABDOMEN, AND PELVIS WITH CONTRAST TECHNIQUE: Multidetector CT imaging of the chest, abdomen and pelvis was performed following the standard protocol during bolus administration of intravenous contrast. CONTRAST:  124mL OMNIPAQUE IOHEXOL 300 MG/ML  SOLN COMPARISON:  CT the chest, abdomen and pelvis 11/01/2019. FINDINGS: CT CHEST FINDINGS Cardiovascular: Heart size is normal. Trace amount of pericardial fluid and/or thickening, unlikely to be of hemodynamic significance at this time. No associated pericardial calcification. There is aortic atherosclerosis, as well as atherosclerosis of the great vessels of the mediastinum and the coronary arteries, including calcified atherosclerotic plaque in the left main, left anterior descending and right coronary arteries.Right internal jugular single-lumen porta cath with tip terminating at the superior cavoatrial junction. Mediastinum/Nodes: No pathologically enlarged mediastinal or hilar lymph nodes. Esophagus is unremarkable in appearance. No axillary lymphadenopathy. Lungs/Pleura: Multiple large pulmonary nodules and masses are again noted in the lungs bilaterally, generally increased in size compared to the prior examination. The largest of these is in  the left upper lobe near the apex (axial image 17 of series 2) measuring 5.5 x 5.6 cm, significantly increased from 2.1 x 1.8 cm on the prior examination. This lesion is centrally low-attenuation, suggesting internal areas of necrosis. Small bilateral pleural effusions. New large left pneumothorax with considerable atelectasis in the left lung. Musculoskeletal: There are no aggressive appearing lytic or blastic lesions noted in the visualized portions of the skeleton. CT ABDOMEN PELVIS FINDINGS Hepatobiliary: No suspicious cystic or solid hepatic lesions. No intra or extrahepatic biliary ductal dilatation. Status  post cholecystectomy. Pancreas: No pancreatic mass. No pancreatic ductal dilatation. No pancreatic or peripancreatic fluid collections or inflammatory changes. Spleen: Unremarkable. Adrenals/Urinary Tract: In the upper pole the left kidney there is enhancing lesion measuring 1.7 cm in diameter, increased in size compared to the prior study (previously 1.2 cm), concerning for neoplasm. Exophytic 1.2 cm low-attenuation lesion in the lower pole the left kidney, compatible with a small simple cyst. Right kidney and bilateral adrenal glands are normal in appearance. No hydroureteronephrosis. Urinary bladder is normal in appearance. Stomach/Bowel: The appearance of the stomach is normal. There is no pathologic dilatation of small bowel or colon. Normal appendix. Vascular/Lymphatic: Aortic atherosclerosis, without evidence of aneurysm or dissection in the abdominal or pelvic vasculature. No lymphadenopathy noted in the abdomen or pelvis. Reproductive: Prostate gland and seminal vesicles are unremarkable in appearance. Other: No significant volume of ascites.  No pneumoperitoneum. Musculoskeletal: There are no aggressive appearing lytic or blastic lesions noted in the visualized portions of the skeleton. IMPRESSION: 1. New large left hydropneumothorax. 2. Widespread metastatic disease to the lungs appears  progressive compared to the prior study, as above. 3. Enlarging probably enhancing lesion in the upper pole the left kidney concerning for neoplasm, either metastatic or primary. 4. Small right pleural effusion. 5. Aortic atherosclerosis, in addition to left main and 2 vessel coronary artery disease. Please note that although the presence of coronary artery calcium documents the presence of coronary artery disease, the severity of this disease and any potential stenosis cannot be assessed on this non-gated CT examination. Assessment for potential risk factor modification, dietary therapy or pharmacologic therapy may be warranted, if clinically indicated. 6. Additional incidental findings, as above. Critical Value/emergent results were called by telephone at the time of interpretation on 02/06/2020 at 11:35 am to provider [facility] , who verbally acknowledged these results. Electronically Signed   By: Vinnie Langton M.D.   On: 02/06/2020 11:36   DG Chest Port 1 View  Result Date: 02/27/2020 CLINICAL DATA:  Pneumothorax.  History of liposarcoma. EXAM: PORTABLE CHEST 1 VIEW COMPARISON:  02/26/2020. 02/25/2020. 02/22/2020. CT chest 02/06/2020. FINDINGS: PowerPort catheter and left chest tube in stable position. Tiny left apical pneumothorax again noted without interim change. Multiple bilateral pulmonary masses again noted consistent metastatic disease. No pleural effusion. Stable cardiomegaly. IMPRESSION: 1. PowerPort catheter and left chest tube in stable position. Tiny left apical pneumothorax again noted without interim change. 2. Multiple bilateral pulmonary masses again noted without interim change. Electronically Signed   By: Marcello Moores  Register   On: 02/27/2020 05:44   DG CHEST PORT 1 VIEW  Result Date: 02/26/2020 CLINICAL DATA:  Shortness of breath. Chest tube present. Known liposarcoma EXAM: PORTABLE CHEST 1 VIEW COMPARISON:  February 25, 2020. FINDINGS: Chest tube position the left is stable.  Trace pneumothorax noted on the left. Port-A-Cath tip is at the cavoatrial junction. Multiple mass lesions remain. Left base and right midlung atelectatic changes are stable. Heart is upper normal in size with pulmonary vascularity normal. No adenopathy is appreciable by radiography. No bone lesions. IMPRESSION: Chest tube on the left with trace apical pneumothorax on the left. Multiple mass lesions remain. Areas of atelectatic change are essentially stable. Stable cardiac silhouette. Electronically Signed   By: Lowella Grip III M.D.   On: 02/26/2020 08:10   DG Chest Port 1 View  Result Date: 02/25/2020 CLINICAL DATA:  Chest tube present EXAM: PORTABLE CHEST 1 VIEW COMPARISON:  February 24, 2020. FINDINGS: Chest tube position on the left is unchanged.  Port-A-Cath tip is at the cavoatrial junction, stable. Subcutaneous air remains on the left. No pneumothorax evident. Multiple mass lesions again noted consistent with known metastatic foci. Atelectatic change stable in left base and right mid lung regions. New mild atelectatic change left mid lung. Heart upper normal in size, stable, with pulmonary vascularity normal. No appreciable adenopathy. IMPRESSION: Stable tube and catheter positions. No pneumothorax. Metastatic disease again noted. Areas of atelectatic change in the right mid lung and left base noted. New mild atelectasis left mid lung. Stable cardiac silhouette. Electronically Signed   By: Lowella Grip III M.D.   On: 02/25/2020 10:09   DG Chest Port 1 View  Result Date: 02/24/2020 CLINICAL DATA:  Liposarcoma. Chest tube in place for recent pneumothorax EXAM: PORTABLE CHEST 1 VIEW COMPARISON:  February 23, 2020 FINDINGS: Chest tube remains on the left, unchanged in position. There is subcutaneous air on the left is again noted. Port-A-Cath tip is in the superior vena cava near the cavoatrial junction. Currently there is no appreciable pneumothorax. Multiple mass lesions remain, largest in  left upper lobe, consistent with metastatic foci. Atelectatic change noted in left base and right mid lung. Heart upper normal in size with pulmonary vascularity normal. No adenopathy appreciable by radiography. Degenerative change noted in each shoulder. IMPRESSION: Tube and catheter positions as described without appreciable pneumothorax. Pulmonary metastases again noted without change. There is left base and right midlung atelectatic change. No consolidation. Stable cardiac silhouette. Electronically Signed   By: Lowella Grip III M.D.   On: 02/24/2020 09:08   DG Chest Port 1 View  Result Date: 02/23/2020 CLINICAL DATA:  60 year old male with metastatic sarcoma. Pneumothorax, chest tube. EXAM: PORTABLE CHEST 1 VIEW COMPARISON:  Portable chest 02/22/2020 and earlier. FINDINGS: Stable left chest tube. Small left pneumothorax has decreased, trace residual with pleural edge visible laterally. Small volume left chest wall gas has decreased. Underlying pulmonary metastatic disease, stable bilateral pulmonary masses. Stable cardiac size and mediastinal contours. Stable right chest power port. Visualized tracheal air column is within normal limits. No new pulmonary opacity. Stable visualized osseous structures. IMPRESSION: 1. Stable left chest tube. Decreased and now trace left pneumothorax. 2. Stable pulmonary metastatic disease. Electronically Signed   By: Genevie Ann M.D.   On: 02/23/2020 06:34   DG CHEST PORT 1 VIEW  Result Date: 02/22/2020 CLINICAL DATA:  Follow-up chest tube EXAM: PORTABLE CHEST 1 VIEW COMPARISON:  02/21/2020 FINDINGS: Cardiac shadow is enlarged but stable. Right chest wall port is again seen. Bilateral metastatic lesions are again identified and stable. Pigtail catheter is noted on the left. Small pneumothorax is again seen. No new focal abnormality is noted. IMPRESSION: Stable small left pneumothorax. Multifocal pulmonary metastatic disease as described. Electronically Signed   By: Inez Catalina M.D.   On: 02/22/2020 10:06   DG Chest Portable 1 View  Result Date: 02/21/2020 CLINICAL DATA:  Chest tube placement for pneumothorax EXAM: PORTABLE CHEST 1 VIEW COMPARISON:  February 21, 2020 study obtained earlier in the day FINDINGS: Chest tube now present on the left with tip directed medially. Small residual lateral pneumothorax without tension component. Subcutaneous air noted on the left. Multiple mass lesions again noted throughout the lungs, largest left upper lobe region. Atelectatic change again noted in right mid and lower lung regions. Heart size and pulmonary vascularity are normal. No adenopathy evident. Port-A-Cath tip in superior vena cava. IMPRESSION: Left-sided pneumothorax much smaller after chest tube placement. No tension component. Multiple mass lesions consistent with known  liposarcoma. Atelectatic change again noted on the right. Stable cardiac silhouette. Electronically Signed   By: Lowella Grip III M.D.   On: 02/21/2020 11:25   DG CHEST PORT 1 VIEW  Result Date: 02/21/2020 CLINICAL DATA:  Known pneumothorax. History of lower extremity liposarcoma EXAM: PORTABLE CHEST 1 VIEW COMPARISON:  February 20, 2020 chest radiograph and chest CT February 06, 2020 FINDINGS: Previously noted pneumothorax on the left is unchanged without appreciable tension component. Subcutaneous air noted along the lateral left hemithorax. Multiple mass lesions throughout the lungs, largest in the left upper lobe, remain without change. Atelectatic change noted in right mid lung. No new opacity evident. Heart is upper normal in size with pulmonary vascularity within normal limits. No adenopathy evident. Port-A-Cath tip in superior vena cava. No bone lesions. IMPRESSION: Stable left-sided pneumothorax that tension component. Multiple mass lesions throughout the lungs again noted, largest left upper lobe consistent with known neoplasm. Atelectasis right mid lung. No new opacity evident compared to 1 day  prior. Stable cardiac silhouette. Electronically Signed   By: Lowella Grip III M.D.   On: 02/21/2020 08:13   DG Chest Port 1 View  Result Date: 02/20/2020 CLINICAL DATA:  Pneumothorax follow-up EXAM: PORTABLE CHEST 1 VIEW COMPARISON:  February 20, 2020 FINDINGS: There is a persistent unchanged left-sided pneumothorax. There is subcutaneous gas along the patient's left flank. There are airspace opacities involving the left upper lung zone and right lung base. There is a well-positioned right-sided Port-A-Cath. No definite right-sided pneumothorax. There is a small right-sided pleural effusion. IMPRESSION: No significant interval change. Persistent bilateral airspace opacities in addition to a moderate-sized left-sided pneumothorax. Electronically Signed   By: Constance Holster M.D.   On: 02/20/2020 18:47   DG Chest Port 1 View  Result Date: 02/12/2020 CLINICAL DATA:  60 year old male with history of pneumothorax status post chest tube placement. EXAM: PORTABLE CHEST 1 VIEW COMPARISON:  Chest x-ray 02/09/2020. FINDINGS: Right internal jugular single-lumen power porta cath with tip terminating at the superior cavoatrial junction. Left-sided chest tube in position with tip and side port projecting over the lower left hemithorax. Small persistent left pneumothorax, increased slightly in size compared to the prior study. No right-sided pneumothorax. Multiple pulmonary nodules and masses again noted, better demonstrated on prior chest CT 02/06/2020. No definite acute consolidative airspace disease. No pleural effusions. No evidence of pulmonary edema. Heart size is normal. Upper mediastinal contours are within normal limits. Extensive subcutaneous emphysema in the left chest wall again noted. IMPRESSION: 1. Support apparatus, as above. 2. Increased size of small left-sided pneumothorax. 3. Multiple pulmonary nodules and masses, similar to prior examinations, presumably reflective of metastatic disease.  Electronically Signed   By: Vinnie Langton M.D.   On: 02/12/2020 08:16   DG CHEST PORT 1 VIEW  Result Date: 02/09/2020 CLINICAL DATA:  Follow-up hydropneumothorax. Status post chest tube removal. EXAM: PORTABLE CHEST 1 VIEW COMPARISON:  02/09/2020 FINDINGS: Right chest wall port a catheter is noted with tip at the cavoatrial junction. The left-sided chest tube has been removed. Small left apical pneumothorax persists. Unchanged appearance of gas within the soft tissues of the left chest wall. Masslike opacities within the left upper lobe and right base are unchanged. IMPRESSION: Persistent small left apical pneumothorax status post left-sided chest tube removal. Electronically Signed   By: Kerby Moors M.D.   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 Recent left pneumothorax. EXAM:  PORTABLE CHEST 1 VIEW COMPARISON:  Portable chest 02/08/2020 and earlier. FINDINGS: Portable AP upright view at 0816 hours. Left chest tube is in place although there may be side holes outside of the left pleural space (arrow). A small volume of left chest wall gas has increased. Small residual left pneumothorax. No mediastinal shift. Masslike bilateral pulmonary opacities compatible with known metastases. Stable cardiac size and mediastinal contours. Visualized tracheal air column is within normal limits. Stable right chest porta cath. Negative visible bowel gas pattern. Stable visualized osseous structures. IMPRESSION: 1. Left chest tube in place although there may be tube side holes outside of the pleural space. A small volume of chest wall gas has increased. 2. Stable small residual pneumothorax. 3. Stable pulmonary metastases. Electronically Signed   By: Genevie Ann M.D.   On: 02/09/2020 08:25   DG CHEST PORT 1 VIEW  Result Date: 02/08/2020 CLINICAL DATA:  Hydropneumothorax, chest tube on LEFT EXAM: PORTABLE CHEST 1 VIEW COMPARISON:   Portable exam 0947 hours compared to 02/07/2020 FINDINGS: Pigtail RIGHT thoracostomy tube, partially withdrawn since previous exam with tip projecting at LEFT costal margin. Persistent small LEFT apex pneumothorax. Basilar LEFT pleural effusion. Normal heart size mediastinal contours. RIGHT jugular Port-A-Cath stable tip projecting over SVC. Atelectasis versus infiltrate at RIGHT base. BILATERAL pulmonary nodules, largest a mass at LEFT apex. Bones demineralized. IMPRESSION: Persistent small LEFT hydropneumothorax. LEFT thoracostomy tube has been partially withdrawn with the pigtail now at LEFT costal margin. Electronically Signed   By: Lavonia Dana M.D.   On: 02/08/2020 15:11   DG Chest Portable 1 View  Result Date: 02/07/2020 CLINICAL DATA:  Follow-up left pneumothorax.  Lung carcinoma. EXAM: PORTABLE CHEST 1 VIEW COMPARISON:  Prior today FINDINGS: There has been placement of a left pleural pigtail catheter since previous study, with near complete resolution of left pneumothorax since prior exam. Multiple pulmonary nodules and masses are again seen in both lungs, largest in the left upper lobe and right lower lobe. Small right pleural effusion remains stable. Right-sided Port-A-Cath remains in appropriate position. IMPRESSION: Near complete resolution of left pneumothorax following left pleural catheter placement. Electronically Signed   By: Marlaine Hind M.D.   On: 02/07/2020 14:55   DG Chest Portable 1 View  Result Date: 02/07/2020 CLINICAL DATA:  Left pneumothorax. EXAM: PORTABLE CHEST 1 VIEW COMPARISON:  CT 02/06/2020. FINDINGS: Mediastinum hilar structures normal. Borderline cardiomegaly. Large left pneumothorax noted. Similar finding noted on prior CT. Prominent atelectasis left lung. Multiple prominent bilateral pulmonary masses again noted as noted on prior CT. No acute bony abnormality identified. Contrast in the colon. IMPRESSION: 1. Large left pneumothorax. Similar finding noted on prior CT.  Prominent atelectasis left lung. 2. Multiple prominent bilateral pulmonary masses again noted as noted on prior CT. Electronically Signed   By: Marcello Moores  Register   On: 02/07/2020 13:50   DG Chest Port 1V same Day  Result Date: 02/09/2020 CLINICAL DATA:  Shortness of breath, history of metastatic lung carcinoma EXAM: PORTABLE CHEST 1 VIEW COMPARISON:  Film from earlier in the same day. FINDINGS: Cardiac shadow is stable. Left-sided chest tube is again identified. Small left apical pneumothorax is again identified stable in appearance. Stable left upper lobe mass lesion is noted. Nodules are noted in the right lung as well as patchy atelectatic changes in the bases bilaterally. Considerable subcutaneous emphysema is noted. No bony abnormality is seen. Right chest wall port is noted. IMPRESSION: Stable left apical pneumothorax with chest tube in place. Changes of metastatic disease  within the lungs. Electronically Signed   By: Inez Catalina M.D.   On: 02/09/2020 23:23   DG Chest Port 1V same Day  Result Date: 02/09/2020 CLINICAL DATA:  Pneumothorax.  Chest tube placement. EXAM: PORTABLE CHEST 1 VIEW COMPARISON:  February 09, 2020 FINDINGS: There has been interval placement of a left-sided chest tube. There is a small residual left-sided pneumothorax, improved from prior study. The right-sided Port-A-Cath is well position. Bilateral airspace opacities are again noted, not substantially changed from prior study. There is subcutaneous emphysema along the patient's left flank, stable to improved from prior study. The heart size is unchanged. IMPRESSION: 1. Interval placement of a left-sided chest tube. 2. Small residual left-sided pneumothorax, improved from prior study. 3. Otherwise, no significant short interval change. Electronically Signed   By: Constance Holster M.D.   On: 02/09/2020 16:25   DG Chest Port 1V same Day  Result Date: 02/09/2020 CLINICAL DATA:  Chest tube removal EXAM: PORTABLE CHEST 1 VIEW  COMPARISON:  02/09/2020 FINDINGS: Further increased size of the left pneumothorax, now noted laterally and at the left apex, approximately 15%. Right Port-A-Cath remains in place, unchanged. Airspace opacity at the right lung base is unchanged. Heart is borderline in size. Subcutaneous emphysema throughout the left chest wall. IMPRESSION: Enlarging left pneumothorax, now approximately 15%. Otherwise no change. These results will be called to the ordering clinician or representative by the Radiologist Assistant, and communication documented in the PACS or [employer]. Electronically Signed   By: Rolm Baptise M.D.   On: 02/09/2020 15:33    Lab Data:  CBC: Recent Labs  Lab 02/20/20 1811 02/21/20 0010 02/23/20 0233 02/24/20 0413 02/25/20 0427 02/26/20 0644 02/27/20 0440  WBC 8.5   < > 5.5 6.4 8.3 13.5* 16.4*  NEUTROABS 6.3  --   --   --   --  13.0*  --   HGB 14.1   < > 11.0* 11.5* 11.4* 11.3* 10.7*  HCT 43.3   < > 32.9* 35.7* 35.6* 33.8* 33.1*  MCV 98.2   < > 96.8 97.3 98.9 96.6 98.2  PLT 415*   < > 297 287 261 262 268   < > = values in this interval not displayed.   Basic Metabolic Panel: Recent Labs  Lab 02/23/20 0233 02/24/20 0413 02/25/20 0427 02/26/20 0644 02/27/20 0440  NA 137 138 137 137 135  K 3.5 3.8 3.9 4.7 4.4  CL 106 107 103 104 104  CO2 24 23 26 24 25   GLUCOSE 107* 93 122* 170* 150*  BUN 9 8 12 13 17   CREATININE 0.60* 0.61 0.57* 0.55* 0.66  CALCIUM 8.3* 8.7* 8.6* 8.9 8.8*  MG  --   --  2.0 2.1  --   PHOS  --   --  4.3 3.5  --    GFR: Estimated Creatinine Clearance: 113.5 mL/min (by C-G formula based on SCr of 0.66 mg/dL). Liver Function Tests: Recent Labs  Lab 02/20/20 1811 02/21/20 0010  AST 43* 40  ALT 32 28  ALKPHOS 133* 118  BILITOT 0.8 0.9  PROT 7.0 6.1*  ALBUMIN 3.4* 2.9*   No results for input(s): LIPASE, AMYLASE in the last 168 hours. No results for input(s): AMMONIA in the last 168 hours. Coagulation Profile: Recent Labs  Lab  02/20/20 1811  INR 1.1   Cardiac Enzymes: No results for input(s): CKTOTAL, CKMB, CKMBINDEX, TROPONINI in the last 168 hours. BNP (last 3 results) No results for input(s): PROBNP in the last 8760 hours.  HbA1C: No results for input(s): HGBA1C in the last 72 hours. CBG: No results for input(s): GLUCAP in the last 168 hours. Lipid Profile: No results for input(s): CHOL, HDL, LDLCALC, TRIG, CHOLHDL, LDLDIRECT in the last 72 hours. Thyroid Function Tests: No results for input(s): TSH, T4TOTAL, FREET4, T3FREE, THYROIDAB in the last 72 hours. Anemia Panel: No results for input(s): VITAMINB12, FOLATE, FERRITIN, TIBC, IRON, RETICCTPCT in the last 72 hours. Urine analysis: No results found for: COLORURINE, APPEARANCEUR, LABSPEC, PHURINE, GLUCOSEU, HGBUR, BILIRUBINUR, KETONESUR, PROTEINUR, UROBILINOGEN, NITRITE, LEUKOCYTESUR   Geraldo Haris M.D. [facility] 02/27/2020, 1:35 PM   Call night coverage person covering after 7pm

## 2020-02-27 NOTE — Evaluation (Signed)
Physical Therapy Evaluation Patient Details Name: Leonard [name] MRN: [id number] DOB: 1960/01/28 Today's Date: 02/27/2020   History of Present Illness  Pt is a 60 y/o male admitted secondary to spontaneous L pneumothorax. Pt is s/p chest tube placement and had it removed 2/15. Pt had recent admission for same issue. PMH includes metastatic liposarcoma.  Clinical Impression  Pt admitted secondary to problem above with deficits below. Overall tolerated mobility well. Demonstrated mild SOB throughout and reported some fatigue. Required standing rest X2 this session. Overall requiring min guard A for safety. Discussed HHPT recommendations, however, pt reports he would like to discuss with his wife first. Will continue to follow acutely.     Follow Up Recommendations Home health PT    Equipment Recommendations  None recommended by PT    Recommendations for Other Services       Precautions / Restrictions Precautions Precautions: Fall Restrictions Weight Bearing Restrictions: No      Mobility  Bed Mobility Overal bed mobility: Modified Independent                  Transfers Overall transfer level: Needs assistance Equipment used: Straight cane Transfers: Sit to/from Stand Sit to Stand: Min guard         General transfer comment: Min guard for safety.  Ambulation/Gait Ambulation/Gait assistance: Min guard Gait Distance (Feet): 300 Feet Assistive device: Straight cane Gait Pattern/deviations: Step-through pattern;Decreased stride length Gait velocity: Decreased   General Gait Details: Functional weakness noted in RLE. Min guard for safety. Required standing rest X2 this session.  Stairs            Wheelchair Mobility    Modified Rankin (Stroke Patients Only)       Balance Overall balance assessment: Needs assistance Sitting-balance support: No upper extremity supported;Feet supported Sitting balance-Leahy Scale: Good     Standing balance support:  Single extremity supported;No upper extremity supported Standing balance-Leahy Scale: Fair Standing balance comment: Able to maintain static standing without UE support                             Pertinent Vitals/Pain Pain Assessment: 0-10 Pain Score: 5  Faces Pain Scale: No hurt Pain Location: chest tube site Pain Descriptors / Indicators: Sore Pain Intervention(s): Limited activity within patient's tolerance;Monitored during session;Repositioned    Home Living Family/patient expects to be discharged to:: Private residence Living Arrangements: Spouse/significant other Available Help at Discharge: Family Type of Home: House Home Access: Stairs to enter Entrance Stairs-Rails: None Entrance Stairs-Number of Steps: 2 Home Layout: One level Home Equipment: Cane - single point;Shower seat      Prior Function Level of Independence: Independent with assistive device(s)         Comments: Uses cane for ambulation     Hand Dominance        Extremity/Trunk Assessment   Upper Extremity Assessment Upper Extremity Assessment: Overall WFL for tasks assessed    Lower Extremity Assessment Lower Extremity Assessment: Generalized weakness    Cervical / Trunk Assessment Cervical / Trunk Assessment: Normal  Communication   Communication: No difficulties  Cognition Arousal/Alertness: Awake/alert Behavior During Therapy: WFL for tasks assessed/performed Overall Cognitive Status: Within Functional Limits for tasks assessed                                        General Comments  Exercises     Assessment/Plan    PT Assessment Patient needs continued PT services  PT Problem List Cardiopulmonary status limiting activity;Decreased strength;Decreased balance;Decreased mobility       PT Treatment Interventions Gait training;DME instruction;Functional mobility training;Stair training;Therapeutic activities;Therapeutic exercise;Balance  training;Patient/family education    PT Goals (Current goals can be found in the Care Plan section)  Acute Rehab PT Goals Patient Stated Goal: to walk better PT Goal Formulation: With patient Time For Goal Achievement: 03/12/20 Potential to Achieve Goals: Good    Frequency Min 3X/week   Barriers to discharge        Co-evaluation               AM-PAC PT "6 Clicks" Mobility  Outcome Measure Help needed turning from your back to your side while in a flat bed without using bedrails?: None Help needed moving from lying on your back to sitting on the side of a flat bed without using bedrails?: None Help needed moving to and from a bed to a chair (including a wheelchair)?: A Little Help needed standing up from a chair using your arms (e.g., wheelchair or bedside chair)?: A Little Help needed to walk in hospital room?: A Little Help needed climbing 3-5 steps with a railing? : A Lot 6 Click Score: 19    End of Session   Activity Tolerance: Patient tolerated treatment well Patient left: in bed;with call bell/phone within reach;Other (comment) (with respiratory therapy staff present) Nurse Communication: Mobility status PT Visit Diagnosis: Muscle weakness (generalized) (M62.81);Unsteadiness on feet (R26.81)    Time: 2426-8341 PT Time Calculation (min) (ACUTE ONLY): 18 min   Charges:   PT Evaluation $PT Eval Moderate Complexity: 1 Mod          Reuel Derby, PT, DPT  Acute Rehabilitation Services  Pager: [phone] Office: [phone]   Rudean Hitt 02/27/2020, 1:39 PM

## 2020-02-27 NOTE — Evaluation (Signed)
Clinical/Bedside Swallow Evaluation Patient Details  Name: Leonard [name] MRN: [id number] Date of Birth: 1960/11/28  Today's Date: 02/27/2020 Time: SLP Start Time (ACUTE ONLY): 0907 SLP Stop Time (ACUTE ONLY): 0932 SLP Time Calculation (min) (ACUTE ONLY): 25 min  Past Medical History:  Past Medical History:  Diagnosis Date  . Cancer [facility])    metastatic lung cancer  . Port-A-Cath in place 05/19/2019   right   Past Surgical History:  Past Surgical History:  Procedure Laterality Date  . CHOLECYSTECTOMY    . ESOPHAGEAL DILATION N/A 06/27/2017   Procedure: ESOPHAGEAL DILATION;  Surgeon: Danie Binder, MD;  Location: [facility];  Service: Endoscopy;  Laterality: N/A;  . ESOPHAGOGASTRODUODENOSCOPY N/A 06/27/2017   Procedure: ESOPHAGOGASTRODUODENOSCOPY (EGD);  Surgeon: Danie Binder, MD;  Location: [facility];  Service: Endoscopy;  Laterality: N/A;  . IR IMAGING GUIDED PORT INSERTION  05/04/2019  . knee sx     HPI:  Pt is a 60 y.o. male presenting with spontaneous pneumothorax. CXR from 2/8 revealed interval progression of left pneumothorax approximately 30-40%.; persistent bilateral airspace and opacities in addition to a moderate-sized left-sided pneumothorax. 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 Overall, pt appears to demonstrate a functional oropharygeal swallow with good airway protection. Pt's difficulties appear to be more esophageal as he reports solids and liquids feel stuck and belching was also noted following all PO trials. Provided education for strategies such as sitting upright for 30 minutes after meals and following solids with liquids. Recomend  continuation of regular diet and thin liquids and will continue to f/u given pt's esophageal history and subjective symptoms.  SLP Visit Diagnosis: Dysphagia, unspecified (R13.10)    Aspiration Risk  Mild aspiration risk    Diet Recommendation Regular;Thin liquid   Liquid Administration via: Cup;Straw Medication Administration: Whole meds with liquid Supervision: Patient able to self feed;Intermittent supervision to cue for compensatory strategies Compensations: Follow solids with liquid Postural Changes: Seated upright at 90 degrees;Remain upright for at least 30 minutes after po intake    Other  Recommendations Recommended Consults: Consider GI evaluation Oral Care Recommendations: Oral care BID   Follow up Recommendations None      Frequency and Duration min 1 x/week  1 week       Prognosis Prognosis for Safe Diet Advancement: Good      Swallow Study   General HPI: Pt is a 60 y.o. male presenting with spontaneous pneumothorax. CXR from 2/8 revealed interval progression of left pneumothorax approximately 30-40%.; persistent bilateral airspace and opacities in addition to a moderate-sized left-sided pneumothorax. 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, as well as EGD with esophageal dilation (2019) Type of Study: Bedside Swallow Evaluation Previous Swallow Assessment: None in chart Diet Prior to this Study: Regular;Thin liquids Temperature Spikes Noted: No Respiratory Status: Room air History of Recent Intubation: No Behavior/Cognition: Alert;Pleasant mood;Cooperative Oral Cavity Assessment: Within Functional Limits Oral Care Completed by SLP: No Oral Cavity - Dentition: Adequate natural dentition Vision: Functional for self-feeding Self-Feeding Abilities: Able to feed self Patient Positioning: Upright in bed Baseline Vocal Quality: Normal Volitional Cough: Strong Volitional Swallow: Able to elicit  Oral/Motor/Sensory  Function Overall Oral Motor/Sensory Function: Within functional limits   Ice Chips Ice chips: Not tested   Thin Liquid Thin Liquid: Impaired Presentation: Straw Pharyngeal  Phase Impairments: Throat Clearing - Immediate    Nectar Thick Nectar Thick Liquid: Not tested   Honey Thick Honey Thick Liquid: Not tested   Puree Puree: Not tested   Solid     Solid: Impaired Presentation: Self Fed Oral Phase Functional Implications: Prolonged oral transit      Jeanine Luz., SLP Student 02/27/2020,10:16 AM

## 2020-02-27 NOTE — Progress Notes (Signed)
NAME:  Leonard [name], MRN:  [id number], DOB:  06-10-1960, LOS: 7 ADMISSION DATE:  02/20/2020, CONSULTATION DATE:  02/27/20 REFERRING MD:  Tana Coast, CHIEF COMPLAINT:  Shortness of breath   Brief History:  60 y.o. M with PMH of metastatic Liposarcoma s/p 7 cycles chemotherapy now on palliation therapy with recurrent LLL hydropneumothorax.      History of Present Illness:  60 y.o. M with PMH of metastatic Liposarcoma s/p 7 cycles chemotherapy now on palliation therapy with recurrent LLL hydropneumothorax.   Hospitalized 1/26-2/4 requiring pigtail chest tube and then large bore chest tube.  presented with left pneumothorax 1/26, left pigtail placed, air leak resolved and pigtail was removed on 28th with collapse of left lung requiring placement of large bore chest tube.  Air leak again resolved, this tube was clamped for 3 days prior to removal.   Pt reports he was feeling fairly well after discharge, coughed hard the morning of 2/8 and felt worsening shortness of breath and had some drainage from the site of prior chest tube.  He saw his oncologist who did outpatient CXR showing re-accumulation of pneumothorax, so was sent to the ED.   He was been stable on room air.  CT surgery consulted by ED and did not think that pt was a surgical candidate and recommended admission for observation.    CXR on 2/9 with stable pneumothorax without any tension component, pt does feel slightly worse this morning, but remains on room air.   PCCM consulted for possible repeat chest tube vs. Pleurx.   Past Medical History:   has a past medical history of Cancer ([place]) and Port-A-Cath in place (05/19/2019).  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 Multiple mass lesions throughout the lungs again noted, largest left upper lobe consistent with known neoplasm. Atelectasis right mid lung. No new opacity evident compared to 1 day prior. Stable cardiac silhouette.   Micro Data:  2/8 Covid.19, flu>>negative   Antimicrobials:   Interim History / Subjective:  No new symptoms this morning.  Objective   Blood pressure 110/60, pulse 100, temperature 97.8 F (36.6 C), temperature source Oral, resp. rate 18, height 5\' 10"  (1.778 m), weight 94.7 kg, SpO2 96 %.    FiO2 (%):  [21 %] 21 %   Intake/Output Summary (Last 24 hours) at 02/27/2020 0901 Last data filed at 02/27/2020 0500 Gross per 24 hour  Intake --  Output 650 ml  Net -650 ml   Filed Weights   02/20/20 1735 02/27/20 0500  Weight: 93 kg 94.7 kg   General:  Chronically ill appearing man sitting up eating breakfast HEENT: [place]/AT eyes anicteric Neuro: awake, answering questions appropriately, moving all extremities spontaneously, moving around in bed independently PULM:  Breathing comfortably on RA, no air leak with vigorous coughing GI: obese, soft, NT Extremities: no edema Skin: warm, dry, no rashes  CXR stable-- minimal apical pneumothorax  Resolved Hospital Problem list    Assessment & Plan:   Recurrent left secondary pneumothorax - s/p talc pleurodesis 2/12 with ongoing pain issues - chest tube removed. No additional imaging required unless change in pulmonary symptoms.  -con't pain control per primary- should be significantly better with chest tube out.  Left lower extremity Liposarcoma with metastatic disease to the  lung, LLL necrotic mass with recurrent hydropneumothorax - Diagnosed 2020, follows with [facility].  S/p 7 cycles gemcitabine and docetaxel with progression.  Oncology felt that further chemotherapy would not be helpful.   Plan to have Guardant 360 mutation testing at [facility] or [facility].    Hemoptysis -related to lung mets. - con't  holding AC; I anticipate this is likely to worsen over time with progression of his ccancer. Unless it is massive, there are no helpful interventions. Embolization is indicated if massive hemoptysis.   Reported dysphagia- patient reports gets choked with liquids and difficulty swallowing at times  - SLP consult this morning   PCCM will sign off. Please call with questions.  Goals of Care:  Last date of multidisciplinary goals of care discussion:per primary Family and staff present:  Summary of discussion:  Follow up goals of care discussion due:  Code Status: Full code, pt states he wants to pursue further intervention as needed, continue aggressive care  Labs   CBC: Recent Labs  Lab 02/20/20 1811 02/21/20 0010 02/23/20 0233 02/24/20 0413 02/25/20 0427 02/26/20 0644 02/27/20 0440  WBC 8.5   < > 5.5 6.4 8.3 13.5* 16.4*  NEUTROABS 6.3  --   --   --   --  13.0*  --   HGB 14.1   < > 11.0* 11.5* 11.4* 11.3* 10.7*  HCT 43.3   < > 32.9* 35.7* 35.6* 33.8* 33.1*  MCV 98.2   < > 96.8 97.3 98.9 96.6 98.2  PLT 415*   < > 297 287 261 262 268   < > = values in this interval not displayed.    Basic Metabolic Panel: Recent Labs  Lab 02/23/20 0233 02/24/20 0413 02/25/20 0427 02/26/20 0644 02/27/20 0440  NA 137 138 137 137 135  K 3.5 3.8 3.9 4.7 4.4  CL 106 107 103 104 104  CO2 24 23 26 24 25   GLUCOSE 107* 93 122* 170* 150*  BUN 9 8 12 13 17   CREATININE 0.60* 0.61 0.57* 0.55* 0.66  CALCIUM 8.3* 8.7* 8.6* 8.9 8.8*  MG  --   --  2.0 2.1  --   PHOS  --   --  4.3 3.5  --    GFR: Estimated Creatinine Clearance: 113.5 mL/min (by C-G formula based on SCr of 0.66 mg/dL). Recent Labs  Lab 02/24/20 0413 02/25/20 0427 02/26/20 0644 02/27/20 0440  WBC 6.4 8.3 13.5* 16.4*    Liver Function Tests: Recent Labs  Lab 02/20/20 1811 02/21/20 0010  AST 43* 40  ALT 32 28  ALKPHOS 133* 118  BILITOT 0.8 0.9  PROT 7.0 6.1*  ALBUMIN 3.4* 2.9*   No results for input(s): LIPASE,  AMYLASE in the last 168 hours. No results for input(s): AMMONIA in the last 168 hours.   Julian Hy, DO 02/27/20 9:09 AM [facility]  From 7AM- 7PM if no response to pager, please call [phone]. After hours, 7PM- 7AM, please call Elink  [phone].

## 2020-02-28 DIAGNOSIS — J9383 Other pneumothorax: Secondary | ICD-10-CM | POA: Diagnosis not present

## 2020-02-28 LAB — BASIC METABOLIC PANEL
Anion gap: 8 (ref 5–15)
BUN: 15 mg/dL (ref 6–20)
CO2: 26 mmol/L (ref 22–32)
Calcium: 8.7 mg/dL — ABNORMAL LOW (ref 8.9–10.3)
Chloride: 105 mmol/L (ref 98–111)
Creatinine, Ser: 0.6 mg/dL — ABNORMAL LOW (ref 0.61–1.24)
GFR, Estimated: >60 mL/min (ref >60)
Glucose, Bld: 92 mg/dL (ref 70–99)
Potassium: 3.8 mmol/L (ref 3.5–5.1)
Sodium: 139 mmol/L (ref 135–145)

## 2020-02-28 LAB — CBC
HCT: 32.2 % — ABNORMAL LOW (ref 39.0–52.0)
Hemoglobin: 10.7 g/dL — ABNORMAL LOW (ref 13.0–17.0)
MCH: 32.1 pg (ref 26.0–34.0)
MCHC: 33.2 g/dL (ref 30.0–36.0)
MCV: 96.7 fL (ref 80.0–100.0)
Platelets: 242 10*3/uL (ref 150–400)
RBC: 3.33 MIL/uL — ABNORMAL LOW (ref 4.22–5.81)
RDW: 16.2 % — ABNORMAL HIGH (ref 11.5–15.5)
WBC: 8.4 10*3/uL (ref 4.0–10.5)
nRBC: 0 % (ref 0.0–0.2)

## 2020-02-28 MED ORDER — POTASSIUM CHLORIDE CRYS ER 20 MEQ PO TBCR
40.0000 meq | EXTENDED_RELEASE_TABLET | Freq: Once | ORAL | Status: AC
  Administered 2020-02-28: 40 meq via ORAL
  Filled 2020-02-28: qty 2

## 2020-02-28 MED ORDER — IPRATROPIUM-ALBUTEROL 0.5-2.5 (3) MG/3ML IN SOLN
3.0000 mL | Freq: Three times a day (TID) | RESPIRATORY_TRACT | Status: DC
  Administered 2020-02-28 (×2): 3 mL via RESPIRATORY_TRACT
  Filled 2020-02-28 (×2): qty 3

## 2020-02-28 NOTE — Progress Notes (Signed)
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  At that time he had more than 50% left-sided pneumothorax that was spontaneous.  Patient apparently had been doing fine until the morning of admission, when he had a coughing spell, started feeling more short of breath and pain on the left side of the chest.  Patient went to see his oncologist, x-ray showed reaccumulation of pneumothorax.  Patient had hydropneumothorax associated with the necrotic malignant mass. TCTS was consulted by EDP, recommended admission for observation. Chest x-ray showed persistent bilateral airspace opacity in addition to a moderate-sized left sided hydropneumothorax.  Chest tube placed on 2/9, talc pleurodesis on 2/12. Chest tube removed 02/27/2020.   The plan had been to discharge the patient to home, but the patient was hypokalemic this morning and is complaining of having multiple loose stools. No discharge today.  Consultants  . Gastroenterology . PCCM  Procedures  . Pleurodesis . CT placement and removal.  Antibiotics   Anti-infectives (From admission, onward)   None    .  Subjective  The patient is resting comfortably. He is complaining of dysphagia which has been evaluated by SLP and has been determined to be esophageal in origin. GI has been consulted and they plan on EGD, although timing is as of yet undetermined. Pt also is complaining of having multiple loose BM's.   Objective   Vitals:  Vitals:   02/28/20 1201 02/28/20 1317  BP: 117/73   Pulse: 85 80  Resp: 18 18  Temp: 98 F (36.7 C)   SpO2: 93% 99%    Exam:  Exam:  Constitutional:  The patient is awake, alert, and oriented x 3. Mild distress and  weakness from diarrhea. Respiratory:  . No increased work of breathing. . No wheezes, rales, or rhonchi . No tactile fremitus Cardiovascular:  . Regular rate and rhythm . No murmurs, ectopy, or gallups. . No lateral PMI. No thrills. Abdomen:  . Abdomen is soft, non-tender, slightly distended . No hernias, masses, or organomegaly . Hyperactive bowel sounds.  Musculoskeletal:  . No cyanosis, clubbing, or edema Skin:  . No rashes, lesions, ulcers . palpation of skin: no induration or nodules Neurologic:  . CN 2-12 intact . Sensation all 4 extremities intact Psychiatric:  . Mental status o Mood, affect appropriate o Orientation to person, place, time  . judgment and insight appear intact  I have personally reviewed the following:   Today's Data  . Vitals, CBC, BMP  Micro Data  . Enteric pathogen panel is pending. .   Imaging  . Abdominal film pending  Cardiology Data  . EKG  Scheduled Meds: . ALPRAZolam  0.25 mg Oral QHS  . benzonatate  200 mg Oral TID  . Chlorhexidine Gluconate Cloth  6 each Topical Daily  . docusate sodium  100 mg Oral BID  . escitalopram  10 mg Oral QHS  . gabapentin  300 mg Oral TID  . ipratropium-albuterol  3 mL Nebulization TID  . lidocaine (PF)  250 mg Other Once  . pantoprazole  40 mg Oral BID AC  . polyethylene glycol  17 g Oral Daily  . potassium chloride  40 mEq Oral Once  . senna-docusate  1 tablet  Oral Q1400  . sodium chloride flush  10-40 mL Intracatheter Q12H  . zolpidem  10 mg Oral QHS   Continuous Infusions:  Principal Problem:   Spontaneous pneumothorax Active Problems:   Esophageal dysphagia   Liposarcoma of left lower extremity (HCC)   Dehydration   Left-sided hydropneumothorax--associated with necrotic malignant mass   LOS: 8 days   A & P   Diarrhea: Pt is complaining of having multiple loose BM's this morning. Stool will be sent for enteric pathogens. Pt has not had antibiotics this admission. There is no  abdominal pain, nausea, vomiting or leukocytosis.  I doubt C Diff colitis, but will check it if diarrhea continues. Discharge for today has been cancelled.  Hypokalemia: Related to diarrhea. Supplemented. Will monitor and continue to supplement as necessary.  Spontaneous hydropneumothorax, recurrent in the setting of metastatic disease to the lung, LLL malignant necrotic mass: The patient is status post chest tube placement on 2/9 and talc pleurodesis on 2/12. Chest tube removed on 02/27/2020. Pt states that his pain is less severe today. He is not wheezing. He is saturating 99% on room air. He had a little hemoptysis on 02/25/2020, but this has resolved. The patient has been evaluated by PT/OT and their recommendation is for [facility] PT[facility]OT.   Left lower extremity liposarcoma with metastatic disease to the lung, lytic lesions:  -Was diagnosed in 2020, follows oncology at [facility] s/p 7 cycles of chemotherapy.  Oncology felt further chemotherapy would not be helpful due to progression of the disease. -high risk of readmissions, recurrent hydropneumothorax, metastatic malignancy.  Palliative care was consulted during previous admission, not seen on 2/1, recommended outpatient palliative to follow  Esophageal dysphagia: SLP determined that dysphagia was esophageal in origin. GI has been consulted. They plan on EGD, although timing is tbd.   Dehydration: Resolved. However this is at risk for recurrence with diarrhea. Monitor.  Anxiety, depression: Noted. Continue Lexapro, Xanax.  I have seen and examined this patient myself. I have spent 36 minutes in his evaluation and care.  Code Status: Full CODE STATUS DVT Prophylaxis:  SCD's Level of Care: Level of care: Telemetry Surgical Family Communication: None available Disposition Plan:     Status is: Inpatient  Remains inpatient appropriate because:Inpatient level of care appropriate due to severity of illness  Dispo: The patient is from: Home   Anticipated d/c is to: Home  Anticipated d/c date is: 2 days  Patient currently is not medically stable to dc. Pending  enteric pathogen panel, and resolution, improvement of  diarrhea. d/c.                   Difficult to place patient No  Lyrical Sowle, DO [facility] Direct contact: see [email or web address]  7PM-7AM contact night coverage as above 02/28/2020, 2:23 PM  LOS: 8 days

## 2020-02-28 NOTE — Consult Note (Addendum)
[facility] Consult: 11:15 AM 02/28/2020  LOS: 8 days    Referring Provider: Dr. Benny Lennert Primary Care Physician:  Celene Squibb, MD in [place]. Primary Gastroenterologist:  Dr. Trinda Pascal, [facility].   Reason for Consultation: Dysphagia.   HPI: Leonard [name] is a 60 y.o. male.  PMH LLE liposarcoma w lung mets.  Treated with 3 cycles of doxorubicin 06/01/2019 through 07/02/2019, gemcitabine, docetaxel 07/2019 -01/24/2020.  Current therapy is palliative.  SBRT to right lung in 11/2019.  S/p cholecystectomy.   Received 2 PRBCs in 11/2019 when Hb dropped to 5.9, platelets 62K at the time.  06/2017 EGD with esophageal dilatation. Study performed for food impaction.  Food impaction removed.  2 areas of benign-appearing stenosis at mid esophagus were dilated with Savary dilators up to 17 mm.  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  Gastric erythema/congestion, biopsies obtained.  Multiple small sessile, nonbleeding gastric polyps, 1 of these resected/retrieved. Path: Gastric polyp: Fundic gland polyp.  Esophageal biopsy at 40 cm: aquamous mucosa with increased eosinophils up to 50/high-powered field, could be due to reflux esophagitis versus eosinophilic esophagitis..  Esophageal biopsy at 20 cm: benign squamous mucosa and no increased eosinophils. Dr. Oneida Alar did not add any therapies for eosinophilic esophagitis.  She prescribed viscous lidocaine for chest pain along with Protonix 40 twice daily for 3 months, then drop to once daily.  Pt no longer using PPI.  Denies reflux symptoms, nausea, abdominal pain, altered bowel habits.  Patient never followed up with Dr. Oneida Alar so has not had repeat EGD nor has he ever had colonoscopy.   1/26 -2/4 admission with pneumothorax, treated with chest tube.     RE-Admitted a week ago w recurrent left hydropneumothorax, symptomatic with shortness of breath.  However he is stable on room air.  Chest tube inserted but now removed.  02/24/2020 talc pleurodesis.  Residual vs new, small left apical PTX.  Chest pain associated with the pneumothorax.  Noncritical hemoptysis, on cough suppressants.  Patient reporting dysphagia, choking with PO.  It has been happening intermittently but not that frequently over the last month.  Food and liquid seem to be impeded from the passing beyond region of upper cervical esophagus.  Occasionally has had to spit out the food in his mouth but not necessarily regurgitate.  He did not see it as a big problem.  However it got more prominent during this current hospital stay.  SLP performed bedside swallow eval 2/15.  Appears to have functional oropharyngeal swallow, good airway protection.  His difficulty swallowing appears to be esophageal in nature.  Felt to have mild aspiration risk but approved for regular diet with thin liquids.  GI consultation suggested.    Past Medical History:  Diagnosis Date  . Cancer [facility])    metastatic lung cancer  . Port-A-Cath in place 05/19/2019   right    Past Surgical History:  Procedure Laterality Date  . CHOLECYSTECTOMY    . ESOPHAGEAL DILATION N/A 06/27/2017   Procedure: ESOPHAGEAL DILATION;  Surgeon: Danie Binder, MD;  Location: [facility];  Service: Endoscopy;  Laterality: N/A;  . ESOPHAGOGASTRODUODENOSCOPY N/A 06/27/2017   Procedure: ESOPHAGOGASTRODUODENOSCOPY (EGD);  Surgeon: Danie Binder, MD;  Location: [facility] SUITE;  Service: Endoscopy;  Laterality: N/A;  . IR IMAGING GUIDED PORT INSERTION  05/04/2019  . knee sx      Prior to Admission medications   Medication Sig Start Date End Date Taking? Authorizing Provider  ALPRAZolam (XANAX) 0.25 MG tablet Take 1 tablet (0.25 mg total) by mouth 2 (two) times daily as needed for anxiety. Patient taking differently: Take 0.25 mg  by mouth See admin instructions. Take 0.25 mg by mouth at bedtime and an additional 0.25 mg once a day as needed for anxiety 10/30/19  Yes [name], MD  benzonatate (TESSALON) 200 MG capsule Take 1 capsule (200 mg total) by mouth 3 (three) times daily. 02/20/20  Yes [name], MD  escitalopram (LEXAPRO) 10 MG tablet Take 1 tablet (10 mg total) by mouth daily. Patient taking differently: Take 10 mg by mouth at bedtime. 12/25/19  Yes Jacquelin Hawking, NP  gabapentin (NEURONTIN) 300 MG capsule Take 1 capsule (300 mg total) by mouth 3 (three) times daily. Patient taking differently: Take 300 mg by mouth in the morning and at bedtime. 02/16/20 03/17/20 Yes Mikhail, Velta Addison, DO  HYDROcodone-homatropine (HYCODAN) 5-1.5 MG/5ML syrup Take 10 mLs by mouth every 6 (six) hours as needed for cough. SMARTSIG:1 Teaspoon By Mouth Every 6 Hours PRN Patient taking differently: Take 10 mLs by mouth every 6 (six) hours as needed for cough. 02/20/20  Yes [name], MD  Oxycodone HCl 10 MG TABS Take 0.5 tablets (5 mg total) by mouth every 8 (eight) hours as needed. Patient taking differently: Take 5 mg by mouth every 8 (eight) hours as needed (for moderate to severe pain). 01/03/20  Yes Tanner, Lyndon Code., PA-C  pantoprazole (PROTONIX) 40 MG tablet Take 1 tablet (40 mg total) by mouth 2 (two) times daily before a meal. Patient taking differently: Take 40 mg by mouth 2 (two) times daily as needed (for reflux). 02/16/20 03/17/20 Yes Mikhail, Velta Addison, DO  prochlorperazine (COMPAZINE) 10 MG tablet TAKE (1) TABLET BY MOUTH EVERY (6) HOURS AS NEEDED. Patient taking differently: Take 10 mg by mouth every 6 (six) hours as needed for nausea or vomiting. 01/02/20  Yes Iruku, Arletha Pili, MD  zolpidem (AMBIEN) 10 MG tablet Take 1 tablet (10 mg total) by mouth at bedtime as needed for sleep. Patient taking differently: Take 10 mg by mouth at bedtime. 01/31/20 03/01/20 Yes [name], MD  aluminum-magnesium  hydroxide-simethicone (MAALOX) 200-200-20 MG/5ML SUSP Swish and swallow 5 ml four times daily as needed for mouth sores. Patient not taking: No sig reported 09/21/19   [name], MD  megestrol (MEGACE) 400 MG/10ML suspension Take 10 mLs (400 mg total) by mouth 2 (two) times daily. Patient not taking: No sig reported 01/31/20   [name], MD    Scheduled Meds: . ALPRAZolam  0.25 mg Oral QHS  . benzonatate  200 mg Oral TID  . Chlorhexidine Gluconate Cloth  6 each Topical Daily  . docusate sodium  100 mg Oral BID  . escitalopram  10 mg Oral QHS  . gabapentin  300 mg Oral TID  . ipratropium-albuterol  3 mL Nebulization TID  . lidocaine (PF)  250 mg Other Once  . pantoprazole  40 mg Oral BID AC  . polyethylene glycol  17 g Oral Daily  . senna-docusate  1 tablet Oral Q1400  . sodium chloride flush  10-40  mL Intracatheter Q12H  . zolpidem  10 mg Oral QHS   Infusions:  PRN Meds: ALPRAZolam, HYDROcodone-homatropine, ondansetron **OR** ondansetron (ZOFRAN) IV, oxyCODONE, prochlorperazine, sodium chloride flush   Allergies as of 02/20/2020  . (No Known Allergies)    Family History  Problem Relation Age of Onset  . Heart disease Father   . Cancer Maternal Aunt   . Cancer Maternal Uncle   . Cancer Paternal Uncle   . Vision loss Maternal Grandfather   . Colon cancer Neg Hx   . Colon polyps Neg Hx     Social History   Socioeconomic History  . Marital status: Married    Spouse name: Not on file  . Number of children: 1  . Years of education: Not on file  . Highest education level: Not on file  Occupational History  . Occupation: Unemployed  Tobacco Use  . Smoking status: Former Research scientist (life sciences)  . Smokeless tobacco: Never Used  Vaping Use  . Vaping Use: Never used  Substance and Sexual Activity  . Alcohol use: Yes    Comment: 1-2 drinks per month  . Drug use: Never  . Sexual activity: Not Currently  Other Topics Concern  . Not on file  Social History Narrative    DOES CONSTRUCTION. MARRIED. RARE ETOH. NO TOBACCO.   Social Determinants of Health   Financial Resource Strain: Medium Risk  . Difficulty of Paying Living Expenses: Somewhat hard  Food Insecurity: No Food Insecurity  . Worried About Charity fundraiser in the Last Year: Never true  . Ran Out of Food in the Last Year: Never true  Transportation Needs: No Transportation Needs  . Lack of Transportation (Medical): No  . Lack of Transportation (Non-Medical): No  Physical Activity: Inactive  . Days of Exercise per Week: 0 days  . Minutes of Exercise per Session: 0 min  Stress: Stress Concern Present  . Feeling of Stress : To some extent  Social Connections: Moderately Integrated  . Frequency of Communication with Friends and Family: More than three times a week  . Frequency of Social Gatherings with Friends and Family: Once a week  . Attends Religious Services: More than 4 times per year  . Active Member of Clubs or Organizations: No  . Attends Archivist Meetings: Never  . Marital Status: Married  Human resources officer Violence: Not At Risk  . Fear of Current or Ex-Partner: No  . Emotionally Abused: No  . Physically Abused: No  . Sexually Abused: No    REVIEW OF SYSTEMS: Constitutional: Some fatigue and weakness. ENT:  No nose bleeds Pulm: Improved dyspnea but still having some pleuritic pain.  Cough productive of minor sputum. CV:  No palpitations, + LE edema.  No angina GU:  No hematuria, no frequency GI: See HPI.  Stools are brown.  No diarrhea or rectal bleeding.  No abdominal pain.  No nausea. Heme: Denies unusual or excessive bleeding or bruising. Transfusions: Received PRBCs in 11/2019 when Hb dropped to 5.9, platelets 60 2K at the time. Neuro:  No headaches, no peripheral tingling or numbness Derm:  No itching, no rash or sores.  Endocrine:  No sweats or chills.  No polyuria or dysuria Immunization: Not queried. Travel:  None beyond local counties in last few  months.    PHYSICAL EXAM: Vital signs in last 24 hours: Vitals:   02/28/20 0509 02/28/20 0802  BP: 120/79   Pulse: 68 88  Resp: 20 20  Temp: 97.8 F (36.6 C)  SpO2: 97% 99%   Wt Readings from Last 3 Encounters:  02/27/20 94.7 kg  02/20/20 93 kg  02/16/20 94.4 kg    General: Pleasant, somewhat pale, moderately ill-appearing, comfortable Head: No facial asymmetry or swelling.  No signs of head trauma. Eyes: Conjunctiva slightly pale.  No scleral icterus.  EOMI Ears: No hearing difficulties Nose: No congestion or discharge. Mouth: Tongue midline.  Oral mucosa moist, pink, clear.  Tongue midline.  No pharyngeal pathology. Neck: No JVD, no masses, no thyromegaly Lungs: Diminished breath sounds but clear bilaterally.  Occasional slightly mucoid cough. Heart: RRR.  No MRG.  S1, S2 present Abdomen: Soft without distention.  Slight tenderness in the left abdomen without guarding or rebound.  Active bowel sounds.  No HSM, masses, bruits, hernias..   Rectal: Deferred Musc/Skeltl: No joint swelling or deformities. Extremities: Left greater than right pedal edema. Neurologic: Alert.  Oriented x3.  Moves all 4 limbs without tremor, strength not tested. Skin: No rash.  There is a sore on the anterior left ankle which is bandaged, bandage not removed for evaluation. Tattoos: Professional quality tattoo on the upper left chest.   Psych: Pleasant, calm, cooperative  Intake/Output from previous day: 02/15 0701 - 02/16 0700 In: -  Out: 300 [Urine:300] Intake/Output this shift: No intake/output data recorded.  LAB RESULTS: Recent Labs    02/26/20 0644 02/27/20 0440 02/28/20 0330  WBC 13.5* 16.4* 8.4  HGB 11.3* 10.7* 10.7*  HCT 33.8* 33.1* 32.2*  PLT 262 268 242   BMET Lab Results  Component Value Date   NA 139 02/28/2020   NA 135 02/27/2020   NA 137 02/26/2020   K 3.8 02/28/2020   K 4.4 02/27/2020   K 4.7 02/26/2020   CL 105 02/28/2020   CL 104 02/27/2020   CL 104  02/26/2020   CO2 26 02/28/2020   CO2 25 02/27/2020   CO2 24 02/26/2020   GLUCOSE 92 02/28/2020   GLUCOSE 150 (H) 02/27/2020   GLUCOSE 170 (H) 02/26/2020   BUN 15 02/28/2020   BUN 17 02/27/2020   BUN 13 02/26/2020   CREATININE 0.60 (L) 02/28/2020   CREATININE 0.66 02/27/2020   CREATININE 0.55 (L) 02/26/2020   CALCIUM 8.7 (L) 02/28/2020   CALCIUM 8.8 (L) 02/27/2020   CALCIUM 8.9 02/26/2020   LFT No results for input(s): PROT, ALBUMIN, AST, ALT, ALKPHOS, BILITOT, BILIDIR, IBILI in the last 72 hours. PT/INR Lab Results  Component Value Date   INR 1.1 02/20/2020   INR 0.9 05/04/2019   Hepatitis Panel No results for input(s): HEPBSAG, HCVAB, HEPAIGM, HEPBIGM in the last 72 hours. C-Diff No components found for: CDIFF Lipase     Component Value Date/Time   LIPASE 26 04/24/2019 1151    Drugs of Abuse  No results found for: LABOPIA, COCAINSCRNUR, LABBENZ, AMPHETMU, THCU, LABBARB   RADIOLOGY STUDIES: DG Chest Port 1 View  Result Date: 02/27/2020 CLINICAL DATA:  Pneumothorax.  History of liposarcoma. EXAM: PORTABLE CHEST 1 VIEW COMPARISON:  02/26/2020. 02/25/2020. 02/22/2020. CT chest 02/06/2020. FINDINGS: PowerPort catheter and left chest tube in stable position. Tiny left apical pneumothorax again noted without interim change. Multiple bilateral pulmonary masses again noted consistent metastatic disease. No pleural effusion. Stable cardiomegaly. IMPRESSION: 1. PowerPort catheter and left chest tube in stable position. Tiny left apical pneumothorax again noted without interim change. 2. Multiple bilateral pulmonary masses again noted without interim change. Electronically Signed   By: Marcello Moores  Register   On: 02/27/2020 05:44     IMPRESSION:   *  Dysphagia.  Food impaction in 06/2017.  EGD at that time showed benign fundic gland gastric polyps, possible eosinophilic esophagitis, benign-appearing esophageal strictures at midesophagus were dilated..    No subsequent GI follow-up.   Not using H2 blocker or PPI at home.  Currently being treated with Protonix 40 po/bid.    *    Spontaneous left pneumothorax.  Chest tumor removed 2/15.  *    Metastatic liposarcoma.  Oncologist: Dr Delton Coombes  *   [place] anemia.  Hgb stable.  Did require PRBCs x2 in 08/2019.    PLAN:     *   EGD with potential esophageal dilatation, timing TBD   Azucena Freed  02/28/2020, 11:15 AM Phone [phone]     Attending Physician Note   I have taken a history, examined the patient and reviewed the chart. I agree with the Advanced Practitioner's note, impression and recommendations.  Intermittent solid food dysphagia. History of esophageal stricture, eosinophilic esophagitis. Metastatic liposarcoma, recent spontaneous pneuomothorax with chest tube removed yesterday. His dysphagia is intermittent and he has had no problems the past few days. He prefers to wait on EGD until he is feeling better and I agree. Recommend pantoprazole 40 mg qd, repeat EGD as outpatient and care with eating meat, bread until EGD. As Dr. Oneida Alar is no longer practicing with [facility] he can schedule with Drs. Rourk or Set designer in that practice as outpatient. GI signing off.   Lucio Edward, MD FACG [phone]

## 2020-02-29 DIAGNOSIS — J9383 Other pneumothorax: Secondary | ICD-10-CM | POA: Diagnosis not present

## 2020-02-29 LAB — BASIC METABOLIC PANEL
Anion gap: 9 (ref 5–15)
BUN: 15 mg/dL (ref 6–20)
CO2: 25 mmol/L (ref 22–32)
Calcium: 8.6 mg/dL — ABNORMAL LOW (ref 8.9–10.3)
Chloride: 104 mmol/L (ref 98–111)
Creatinine, Ser: 0.75 mg/dL (ref 0.61–1.24)
GFR, Estimated: >60 mL/min (ref >60)
Glucose, Bld: 95 mg/dL (ref 70–99)
Potassium: 4.1 mmol/L (ref 3.5–5.1)
Sodium: 138 mmol/L (ref 135–145)

## 2020-02-29 LAB — CBC
HCT: 34.1 % — ABNORMAL LOW (ref 39.0–52.0)
Hemoglobin: 11.5 g/dL — ABNORMAL LOW (ref 13.0–17.0)
MCH: 32.7 pg (ref 26.0–34.0)
MCHC: 33.7 g/dL (ref 30.0–36.0)
MCV: 96.9 fL (ref 80.0–100.0)
Platelets: 233 10*3/uL (ref 150–400)
RBC: 3.52 MIL/uL — ABNORMAL LOW (ref 4.22–5.81)
RDW: 16 % — ABNORMAL HIGH (ref 11.5–15.5)
WBC: 8.3 10*3/uL (ref 4.0–10.5)
nRBC: 0 % (ref 0.0–0.2)

## 2020-02-29 MED ORDER — DOCUSATE SODIUM 100 MG PO CAPS: 100.0000 mg | ORAL_CAPSULE | Freq: Two times a day (BID) | ORAL | 0 refills | Status: DC

## 2020-02-29 MED ORDER — IPRATROPIUM-ALBUTEROL 0.5-2.5 (3) MG/3ML IN SOLN: 3.0000 mL | Freq: Four times a day (QID) | RESPIRATORY_TRACT | Status: DC | PRN

## 2020-02-29 MED ORDER — POLYETHYLENE GLYCOL 3350 17 G PO PACK: 17.0000 g | PACK | Freq: Every day | ORAL | 0 refills | Status: DC

## 2020-02-29 MED ORDER — HEPARIN SOD (PORK) LOCK FLUSH 100 UNIT/ML IV SOLN
500.0000 [IU] | INTRAVENOUS | Status: AC | PRN
  Administered 2020-02-29: 500 [IU]
  Filled 2020-02-29: qty 5

## 2020-02-29 NOTE — Progress Notes (Signed)
  Speech Language Pathology Treatment: Dysphagia  Patient Details Name: Leonard [name] MRN: [id number] DOB: 1960/03/17 Today's Date: 02/29/2020 Time: 2060-1561 SLP Time Calculation (min) (ACUTE ONLY): 10 min  Assessment / Plan / Recommendation Clinical Impression  Pt was seen for dysphagia treatment and was cooperative throughout the session. Pt and nursing reported that the pt has been tolerating the current diet without overt s/sx of aspiration. Pt denied any difficulty with regular texture meats or with pills and he denied globus sensation. Pt tolerated regular texture solids, and thin liquids via straw using consecutive swallows without symptoms of oropharyngeal dysphagia. It is recommended that the current diet be continued. Further skilled SLP services are not clinically indicated at this time.    HPI HPI: Pt is a 60 y.o. male presenting with spontaneous pneumothorax. CXR from 2/8 revealed interval progression of left pneumothorax approximately 30-40%.; persistent bilateral airspace and opacities in addition to a moderate-sized left-sided pneumothorax. 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, as well as EGD with esophageal dilation (2019)      SLP Plan  Discharge SLP treatment due to (comment);All goals met       Recommendations  Diet recommendations: Regular;Thin liquid Liquids provided via: Cup;Straw Medication Administration: Whole meds with liquid Supervision: Patient able to self feed Compensations: Follow solids with liquid Postural Changes and/or Swallow Maneuvers: Seated upright 90 degrees                Oral Care Recommendations: Oral care BID Follow up Recommendations: None SLP Visit Diagnosis: Dysphagia, unspecified (R13.10) Plan: Discharge SLP treatment due to (comment);All goals met       Umberto Pavek I. Hardin Negus, [place], [place] Office number [phone] Pager [place] 02/29/2020, 10:37 AM

## 2020-02-29 NOTE — Progress Notes (Signed)
Physical Therapy Treatment Patient Details Name: Leonard [name] MRN: [id number] DOB: 03-Jun-1960 Today's Date: 02/29/2020    History of Present Illness Pt is a 60 y/o male admitted secondary to spontaneous L pneumothorax. Pt is s/p chest tube placement and had it removed 2/15. Pt had recent admission for same issue. PMH includes metastatic liposarcoma.    PT Comments    Patient has been using cane due to LLE weakness, however was using cane in his LUE. Educated that cane is too tall for him (non-adjustable) and educated to use in his RUE. Pt with excellent return demonstration. He refuses RW but does agree to HHPT.     Follow Up Recommendations  Home health PT (pt agrees with need for HHPT (also desires aide due to his wife's illness and she normally assists him))     Equipment Recommendations  None recommended by PT    Recommendations for Other Services       Precautions / Restrictions Precautions Precautions: Fall Precaution Comments: pt denies falls at home    Mobility  Bed Mobility Overal bed mobility: Modified Independent             General bed mobility comments: incr time and effort    Transfers Overall transfer level: Needs assistance Equipment used: Straight cane Transfers: Sit to/from Stand Sit to Stand: Modified independent (Device/Increase time)            Ambulation/Gait Ambulation/Gait assistance: Min guard Gait Distance (Feet): 140 Feet Assistive device: Straight cane Gait Pattern/deviations: Step-through pattern;Decreased stride length Gait velocity: Decreased   General Gait Details: Functional weakness noted in LLE. Min guard for safety due to sudden stabbing pains. Educated on use of cane in RUE due to LLE weakness. Pt with good return demonstration   Stairs             Wheelchair Mobility    Modified Rankin (Stroke Patients Only)       Balance Overall balance assessment: Needs assistance Sitting-balance support: No upper  extremity supported;Feet supported Sitting balance-Leahy Scale: Good     Standing balance support: Single extremity supported;No upper extremity supported Standing balance-Leahy Scale: Fair Standing balance comment: Able to maintain static standing without UE support                            Cognition Arousal/Alertness: Awake/alert Behavior During Therapy: WFL for tasks assessed/performed Overall Cognitive Status: Within Functional Limits for tasks assessed                                        Exercises      General Comments General comments (skin integrity, edema, etc.): Discussed possible need for RW due to sudden stabbing pains. Pt refusing need for RW at this time and reports he can get one if he needs one. Agreed to HHPT for safety evaluation.      Pertinent Vitals/Pain Pain Assessment: Faces Pain Score: 5  Faces Pain Scale: Hurts even more Pain Location: left side Pain Descriptors / Indicators: Stabbing Pain Intervention(s): Limited activity within patient's tolerance;Monitored during session    [place] expects to be discharged to:: Private residence Living Arrangements: Spouse/significant other                  Prior Function            PT Goals (current  goals can now be found in the care plan section) Acute Rehab PT Goals Patient Stated Goal: to walk better Time For Goal Achievement: 03/12/20 Potential to Achieve Goals: Good Progress towards PT goals: Progressing toward goals    Frequency    Min 3X/week      PT Plan Current plan remains appropriate    Co-evaluation              AM-PAC PT "6 Clicks" Mobility   Outcome Measure  Help needed turning from your back to your side while in a flat bed without using bedrails?: None Help needed moving from lying on your back to sitting on the side of a flat bed without using bedrails?: None Help needed moving to and from a bed to a chair  (including a wheelchair)?: None Help needed standing up from a chair using your arms (e.g., wheelchair or bedside chair)?: None Help needed to walk in hospital room?: A Little Help needed climbing 3-5 steps with a railing? : A Lot 6 Click Score: 21    End of Session Equipment Utilized During Treatment: Gait belt Activity Tolerance: Patient tolerated treatment well Patient left: in bed;with call bell/phone within reach   PT Visit Diagnosis: Muscle weakness (generalized) (M62.81);Unsteadiness on feet (R26.81)     Time: 6226-3335 PT Time Calculation (min) (ACUTE ONLY): 28 min  Charges:  $Gait Training: 8-22 mins $Self Care/Home Management: 8-22                      Arby Barrette, PT Pager [phone]    Rexanne Mano 02/29/2020, 11:44 AM

## 2020-02-29 NOTE — Discharge Summary (Signed)
Physician Discharge Summary  Leonard [name] ZCH:[id number] DOB: 1960/09/27 DOA: 02/20/2020  PCP: Celene Squibb, MD  Admit date: 02/20/2020 Discharge date: 02/29/2020  Recommendations for Outpatient Follow-up:  1. Discharge to home 2. Follow up with PCP in 7-10 days 3. Have CBC and chemistry checked on that visit and reported to PCP. 4. Follow up with oncology as directed. Keep all appointments. 5. Follow up with pulmonology in 2-4 weeks. 6. Embolization with interventional radiology for large volume hemoptysis.   Follow-up Information    Home, [facility] up.   Specialty: [facility] Contact information: [place] [address] [phone]              Discharge Diagnoses: Principal diagnosis is #1 1. Diarrhea: In actuality today when talking with the patient there was only one loose BM yesterday am and none since. There is no evidence whatsoever for diarrhea. Enteric pathogen panel has been cancelled. 2. Hypokalemia: Resolved. 3. 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 S/P CT placement and removal.  4. Hemoptysis: Due to metastases in lungs. Per PCCM currently there is no intervention that is warranted. Should the patient develop large volume hemoptysis, PCCM recommends angiography and embolization by IR. 5. 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 Recommendation is for outpatient pallliative therapy. 6. Esophageal dysphagia:   Discharge Condition: Fair  Disposition: Home with home health PT/OT.  Diet recommendation: Heart healthy  Filed Weights   02/20/20 1735 02/27/20 0500 02/29/20 0600  Weight: 93 kg 94.7 kg 91.8 kg    History of present illness: Leonard [name] is a 60 y.o. 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  At that time he had more than 50% left-sided pneumothorax that was spontaneous.  Patient apparently has been doing fine until this morning when he had a cough and started feeling more shortness of breath and pain on the left side of the chest.  He went to see his oncologist where he was seen evaluated and x-ray showed reaccumulation of the hemopneumothorax.  He was sent over to the hospital for further evaluation and treatment.  Patient had ER consultation with cardiothoracic surgery.  He has hydropneumothorax which is associated with a necrotic malignant mass.  He was offered a chance to reinsert the chest to versus observation.  Patient has chosen not to have the chest tube reinserted.  Recommendations to admit the patient for observation.  Repeat chest x-ray in the morning and if he gets worse further discussions regarding Pleurx catheter or reinsertion of chest tube will be made.  He is hemodynamically stable at the moment no obvious distress has some pain and has continued to have some cough.  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 At that time he had more than 50% left-sided pneumothorax that was spontaneous. Patient apparently had been doing fine until the morning of  admission, when he had a coughing spell, started feeling more short of breath and pain on the left side of the chest. Patient went to see his oncologist, x-ray showed reaccumulation of pneumothorax. Patient had hydropneumothorax associated with the necrotic malignant mass.  TCTS was consulted by EDP, recommended admission for observation. Chest x-ray showed persistent bilateral airspace opacity in addition to a moderate-sized left sided hydropneumothorax.  Chest tube placed on 2/9, talc pleurodesis on 2/12. Chest tube removed 02/27/2020.   The plan had been to discharge this patient to home on 02/28/2020. However, the patient complained that he had had multiple loose BM's that morning. For this reason discharge was cancelled, and enteric pathogen panel was ordered. This morning the patient now states that he had only one loose BM yesterday am, and he has not had a BM since. He will be discharged to home with home health.  Today's assessment: S: The patient is resting comfortably. He is complaining of ongoing hemoptysis. No other new complaints. O: Vitals:  Vitals:   02/28/20 2050 02/29/20 0756  BP:  (!) 144/89  Pulse:    Resp:  19  Temp:  98.3 F (36.8 C)  SpO2: 94% 95%   Exam:  Constitutional:  The patient is awake, alert, and oriented x 3. Mild distress and weakness from diarrhea. Respiratory:   No increased work of breathing.  No wheezes, rales, or rhonchi  No tactile fremitus Cardiovascular:   Regular rate and rhythm  No murmurs, ectopy, or gallups.  No lateral PMI. No thrills. Abdomen:   Abdomen is soft, non-tender, non-distended  No hernias, masses, or organomegaly  Normoactive bowel sounds.  Musculoskeletal:   No cyanosis, clubbing, or edema Skin:   No rashes, lesions, ulcers  palpation of skin: no induration or nodules Neurologic:   CN 2-12 intact  Sensation all 4 extremities intact Psychiatric:   Mental status ? Mood, affect appropriate ? Orientation to person, place, time   judgment and insight appear intact   Discharge Instructions  Discharge Instructions    Activity as tolerated - No restrictions   Complete by: As directed    Call MD for:   Complete by: As directed    Large volume hemoptysis.   Call  MD for:  difficulty breathing, headache or visual disturbances   Complete by: As directed    Call MD for:  extreme fatigue   Complete by: As directed    Call MD for:  persistant nausea and vomiting   Complete by: As directed    Call MD for:  severe uncontrolled pain   Complete by: As directed    Call MD for:  temperature >100.4   Complete by: As directed    Diet - low sodium heart healthy   Complete by: As directed    Discharge instructions   Complete by: As directed    Discharge to home Follow up with PCP in 7-10 days Have CBC and chemistry checked on that visit and reported to PCP. Follow up with oncology as directed. Keep all appointments. Follow up with pulmonology in 2-4 weeks. Embolization with interventional radiology for large volume hemoptysis.   Increase activity slowly   Complete by: As directed      Allergies as of 02/29/2020   No Known Allergies     Medication List    STOP taking these medications   megestrol 400 MG/10ML suspension Commonly known as: MEGACE     TAKE these medications   ALPRAZolam 0.25 MG tablet Commonly known as:  XANAX Take 1 tablet (0.25 mg total) by mouth 2 (two) times daily as needed for anxiety. What changed:   when to take this  additional instructions   aluminum-magnesium hydroxide-simethicone 644-034-74 MG/5ML Susp Commonly known as: MAALOX Swish and swallow 5 ml four times daily as needed for mouth sores.   benzonatate 200 MG capsule Commonly known as: TESSALON Take 1 capsule (200 mg total) by mouth 3 (three) times daily.   docusate sodium 100 MG capsule Commonly known as: COLACE Take 1 capsule (100 mg total) by mouth 2 (two) times daily. Hold for more than 2 bowel movements in 24 hours.   escitalopram 10 MG tablet Commonly known as: LEXAPRO Take 1 tablet (10 mg total) by mouth daily. What changed: when to take this   gabapentin 300 MG capsule Commonly known as: NEURONTIN Take 1 capsule (300 mg total) by mouth 3  (three) times daily. What changed: when to take this   HYDROcodone-homatropine 5-1.5 MG/5ML syrup Commonly known as: HYCODAN Take 10 mLs by mouth every 6 (six) hours as needed for cough. SMARTSIG:1 Teaspoon By Mouth Every 6 Hours PRN What changed: additional instructions   Oxycodone HCl 10 MG Tabs Take 0.5 tablets (5 mg total) by mouth every 8 (eight) hours as needed. What changed: reasons to take this   pantoprazole 40 MG tablet Commonly known as: PROTONIX Take 1 tablet (40 mg total) by mouth 2 (two) times daily before a meal. What changed:   when to take this  reasons to take this   polyethylene glycol 17 g packet Commonly known as: MIRALAX / GLYCOLAX Take 17 g by mouth daily. Start taking on: March 01, 2020   prochlorperazine 10 MG tablet Commonly known as: COMPAZINE TAKE (1) TABLET BY MOUTH EVERY (6) HOURS AS NEEDED. What changed:   how much to take  how to take this  when to take this  reasons to take this  additional instructions   zolpidem 10 MG tablet Commonly known as: AMBIEN Take 1 tablet (10 mg total) by mouth at bedtime as needed for sleep. What changed: when to take this      No Known Allergies  The results of significant diagnostics from this hospitalization (including imaging, microbiology, ancillary and laboratory) are listed below for reference.    Significant Diagnostic Studies: DG Chest 2 View  Result Date: 02/20/2020 CLINICAL DATA:  Metastatic sarcoma.  Follow-up pneumothorax EXAM: CHEST - 2 VIEW COMPARISON:  02/15/2020 FINDINGS: Progression of left pneumothorax now approximately 30-40%. Small amount of pleural fluid in the left lung base. Gas in the chest wall on the left is unchanged. Multiple bilateral lung nodules most compatible with metastatic disease, unchanged. Port-A-Cath tip SVC unchanged. Small right pleural effusion. IMPRESSION: Interval progression of left pneumothorax approximately 30-40%. These results were called by  telephone at the time of interpretation on 02/20/2020 at 3:53 pm to provider [facility] , who verbally acknowledged these results. Electronically Signed   By: Franchot Gallo M.D.   On: 02/20/2020 15:53   DG Chest 2 View  Result Date: 02/15/2020 CLINICAL DATA:  Pneumothorax, recent chest tube removal, history of lung cancer EXAM: CHEST - 2 VIEW COMPARISON:  Radiograph 02/14/2020, CT 02/06/2020 FINDINGS: Accessed right IJ approach Port-A-Cath tip terminates in the lower SVC. Telemetry leads and support devices overlie the chest. Cholecystectomy clips in the right upper quadrant. Persistent subcutaneous emphysema extending across the left chest wall. Suspect small residual left apical visceral pleural line with additional increasing lucency along the left heart  and mediastinal borders which could reflect a pneumomediastinum or less likely medial pneumothorax. Small bilateral effusions. Persistent heterogeneous opacities are again seen in the mid to lower lungs with additional bilateral pulmonary masses, largest in the left lung apex. No acute or worrisome osseous abnormality. IMPRESSION: 1. Suspect small residual left apical visceral pleural line/apical pneumothorax. Additional increasing lucency along the left heart and mediastinal borders which could reflect a pneumomediastinum or less likely medial pneumothorax component. Small bilateral effusions. 2. Extensive subcutaneous emphysema remains of the left chest wall. 3. Persistent heterogeneous opacities in the mid to lower lungs, favor edema and atelectasis. 4. Redemonstrated pulmonary masses, largest in the left apex. Electronically Signed   By: Lovena Le M.D.   On: 02/15/2020 06:46   DG Chest 2 View  Result Date: 02/14/2020 CLINICAL DATA:  Pneumothorax EXAM: CHEST - 2 VIEW COMPARISON:  Chest x-ray 02/13/2020 FINDINGS: Right chest wall Port-A-Cath and left chest tube in stable appropriate position. The heart size and mediastinal contours are within  normal limits. Persistent small apical left pneumothorax. Persistent bilateral pulmonary masses again noted. Possible trace right pleural effusion. No definite left pleural effusion. No acute osseous abnormality. IMPRESSION: 1. Grossly stable small left apical pneumothorax. 2. Trace right pleural effusion. 3. Persistent bilateral pulmonary masses. 4. Lines and tubes in stable position. Electronically Signed   By: Iven Finn M.D.   On: 02/14/2020 06:54   DG Chest 2 View  Result Date: 02/13/2020 CLINICAL DATA:  Chest tube. EXAM: CHEST - 2 VIEW COMPARISON:  02/12/2020. FINDINGS: Left chest tube in stable position. Left pneumothorax improved from prior exam with tiny residual left apical pneumothorax. Persistent bilateral pulmonary masses again noted. Small right pleural effusion. Heart size stable. Degenerative change thoracic spine. IMPRESSION: 1. Left chest tube in stable position. Left pneumothorax improved from prior exam with tiny residual left apical pneumothorax. 2. Persistent bilateral pulmonary masses again noted. Small right pleural effusion. Electronically Signed   By: Marcello Moores  Register   On: 02/13/2020 07:00   CT CHEST ABDOMEN PELVIS W CONTRAST  Result Date: 02/06/2020 CLINICAL DATA:  60 year old male with history of liposarcoma in the left lower extremity. EXAM: CT CHEST, ABDOMEN, AND PELVIS WITH CONTRAST TECHNIQUE: Multidetector CT imaging of the chest, abdomen and pelvis was performed following the standard protocol during bolus administration of intravenous contrast. CONTRAST:  143mL OMNIPAQUE IOHEXOL 300 MG/ML  SOLN COMPARISON:  CT the chest, abdomen and pelvis 11/01/2019. FINDINGS: CT CHEST FINDINGS Cardiovascular: Heart size is normal. Trace amount of pericardial fluid and/or thickening, unlikely to be of hemodynamic significance at this time. No associated pericardial calcification. There is aortic atherosclerosis, as well as atherosclerosis of the great vessels of the mediastinum and the  coronary arteries, including calcified atherosclerotic plaque in the left main, left anterior descending and right coronary arteries.Right internal jugular single-lumen porta cath with tip terminating at the superior cavoatrial junction. Mediastinum/Nodes: No pathologically enlarged mediastinal or hilar lymph nodes. Esophagus is unremarkable in appearance. No axillary lymphadenopathy. Lungs/Pleura: Multiple large pulmonary nodules and masses are again noted in the lungs bilaterally, generally increased in size compared to the prior examination. The largest of these is in the left upper lobe near the apex (axial image 17 of series 2) measuring 5.5 x 5.6 cm, significantly increased from 2.1 x 1.8 cm on the prior examination. This lesion is centrally low-attenuation, suggesting internal areas of necrosis. Small bilateral pleural effusions. New large left pneumothorax with considerable atelectasis in the left lung. Musculoskeletal: There are no aggressive appearing lytic  or blastic lesions noted in the visualized portions of the skeleton. CT ABDOMEN PELVIS FINDINGS Hepatobiliary: No suspicious cystic or solid hepatic lesions. No intra or extrahepatic biliary ductal dilatation. Status post cholecystectomy. Pancreas: No pancreatic mass. No pancreatic ductal dilatation. No pancreatic or peripancreatic fluid collections or inflammatory changes. Spleen: Unremarkable. Adrenals/Urinary Tract: In the upper pole the left kidney there is enhancing lesion measuring 1.7 cm in diameter, increased in size compared to the prior study (previously 1.2 cm), concerning for neoplasm. Exophytic 1.2 cm low-attenuation lesion in the lower pole the left kidney, compatible with a small simple cyst. Right kidney and bilateral adrenal glands are normal in appearance. No hydroureteronephrosis. Urinary bladder is normal in appearance. Stomach/Bowel: The appearance of the stomach is normal. There is no pathologic dilatation of small bowel or colon.  Normal appendix. Vascular/Lymphatic: Aortic atherosclerosis, without evidence of aneurysm or dissection in the abdominal or pelvic vasculature. No lymphadenopathy noted in the abdomen or pelvis. Reproductive: Prostate gland and seminal vesicles are unremarkable in appearance. Other: No significant volume of ascites.  No pneumoperitoneum. Musculoskeletal: There are no aggressive appearing lytic or blastic lesions noted in the visualized portions of the skeleton. IMPRESSION: 1. New large left hydropneumothorax. 2. Widespread metastatic disease to the lungs appears progressive compared to the prior study, as above. 3. Enlarging probably enhancing lesion in the upper pole the left kidney concerning for neoplasm, either metastatic or primary. 4. Small right pleural effusion. 5. Aortic atherosclerosis, in addition to left main and 2 vessel coronary artery disease. Please note that although the presence of coronary artery calcium documents the presence of coronary artery disease, the severity of this disease and any potential stenosis cannot be assessed on this non-gated CT examination. Assessment for potential risk factor modification, dietary therapy or pharmacologic therapy may be warranted, if clinically indicated. 6. Additional incidental findings, as above. Critical Value/emergent results were called by telephone at the time of interpretation on 02/06/2020 at 11:35 am to provider [facility] , who verbally acknowledged these results. Electronically Signed   By: Vinnie Langton M.D.   On: 02/06/2020 11:36   DG Chest Port 1 View  Result Date: 02/27/2020 CLINICAL DATA:  Pneumothorax.  History of liposarcoma. EXAM: PORTABLE CHEST 1 VIEW COMPARISON:  02/26/2020. 02/25/2020. 02/22/2020. CT chest 02/06/2020. FINDINGS: PowerPort catheter and left chest tube in stable position. Tiny left apical pneumothorax again noted without interim change. Multiple bilateral pulmonary masses again noted consistent metastatic  disease. No pleural effusion. Stable cardiomegaly. IMPRESSION: 1. PowerPort catheter and left chest tube in stable position. Tiny left apical pneumothorax again noted without interim change. 2. Multiple bilateral pulmonary masses again noted without interim change. Electronically Signed   By: Marcello Moores  Register   On: 02/27/2020 05:44   DG CHEST PORT 1 VIEW  Result Date: 02/26/2020 CLINICAL DATA:  Shortness of breath. Chest tube present. Known liposarcoma EXAM: PORTABLE CHEST 1 VIEW COMPARISON:  February 25, 2020. FINDINGS: Chest tube position the left is stable. Trace pneumothorax noted on the left. Port-A-Cath tip is at the cavoatrial junction. Multiple mass lesions remain. Left base and right midlung atelectatic changes are stable. Heart is upper normal in size with pulmonary vascularity normal. No adenopathy is appreciable by radiography. No bone lesions. IMPRESSION: Chest tube on the left with trace apical pneumothorax on the left. Multiple mass lesions remain. Areas of atelectatic change are essentially stable. Stable cardiac silhouette. Electronically Signed   By: Lowella Grip III M.D.   On: 02/26/2020 08:10   DG Chest [facility]  1 View  Result Date: 02/25/2020 CLINICAL DATA:  Chest tube present EXAM: PORTABLE CHEST 1 VIEW COMPARISON:  February 24, 2020. FINDINGS: Chest tube position on the left is unchanged. Port-A-Cath tip is at the cavoatrial junction, stable. Subcutaneous air remains on the left. No pneumothorax evident. Multiple mass lesions again noted consistent with known metastatic foci. Atelectatic change stable in left base and right mid lung regions. New mild atelectatic change left mid lung. Heart upper normal in size, stable, with pulmonary vascularity normal. No appreciable adenopathy. IMPRESSION: Stable tube and catheter positions. No pneumothorax. Metastatic disease again noted. Areas of atelectatic change in the right mid lung and left base noted. New mild atelectasis left mid lung.  Stable cardiac silhouette. Electronically Signed   By: Lowella Grip III M.D.   On: 02/25/2020 10:09   DG Chest Port 1 View  Result Date: 02/24/2020 CLINICAL DATA:  Liposarcoma. Chest tube in place for recent pneumothorax EXAM: PORTABLE CHEST 1 VIEW COMPARISON:  February 23, 2020 FINDINGS: Chest tube remains on the left, unchanged in position. There is subcutaneous air on the left is again noted. Port-A-Cath tip is in the superior vena cava near the cavoatrial junction. Currently there is no appreciable pneumothorax. Multiple mass lesions remain, largest in left upper lobe, consistent with metastatic foci. Atelectatic change noted in left base and right mid lung. Heart upper normal in size with pulmonary vascularity normal. No adenopathy appreciable by radiography. Degenerative change noted in each shoulder. IMPRESSION: Tube and catheter positions as described without appreciable pneumothorax. Pulmonary metastases again noted without change. There is left base and right midlung atelectatic change. No consolidation. Stable cardiac silhouette. Electronically Signed   By: Lowella Grip III M.D.   On: 02/24/2020 09:08   DG Chest Port 1 View  Result Date: 02/23/2020 CLINICAL DATA:  60 year old male with metastatic sarcoma. Pneumothorax, chest tube. EXAM: PORTABLE CHEST 1 VIEW COMPARISON:  Portable chest 02/22/2020 and earlier. FINDINGS: Stable left chest tube. Small left pneumothorax has decreased, trace residual with pleural edge visible laterally. Small volume left chest wall gas has decreased. Underlying pulmonary metastatic disease, stable bilateral pulmonary masses. Stable cardiac size and mediastinal contours. Stable right chest power port. Visualized tracheal air column is within normal limits. No new pulmonary opacity. Stable visualized osseous structures. IMPRESSION: 1. Stable left chest tube. Decreased and now trace left pneumothorax. 2. Stable pulmonary metastatic disease. Electronically Signed    By: Genevie Ann M.D.   On: 02/23/2020 06:34   DG CHEST PORT 1 VIEW  Result Date: 02/22/2020 CLINICAL DATA:  Follow-up chest tube EXAM: PORTABLE CHEST 1 VIEW COMPARISON:  02/21/2020 FINDINGS: Cardiac shadow is enlarged but stable. Right chest wall port is again seen. Bilateral metastatic lesions are again identified and stable. Pigtail catheter is noted on the left. Small pneumothorax is again seen. No new focal abnormality is noted. IMPRESSION: Stable small left pneumothorax. Multifocal pulmonary metastatic disease as described. Electronically Signed   By: Inez Catalina M.D.   On: 02/22/2020 10:06   DG Chest Portable 1 View  Result Date: 02/21/2020 CLINICAL DATA:  Chest tube placement for pneumothorax EXAM: PORTABLE CHEST 1 VIEW COMPARISON:  February 21, 2020 study obtained earlier in the day FINDINGS: Chest tube now present on the left with tip directed medially. Small residual lateral pneumothorax without tension component. Subcutaneous air noted on the left. Multiple mass lesions again noted throughout the lungs, largest left upper lobe region. Atelectatic change again noted in right mid and lower lung regions. Heart size and  pulmonary vascularity are normal. No adenopathy evident. Port-A-Cath tip in superior vena cava. IMPRESSION: Left-sided pneumothorax much smaller after chest tube placement. No tension component. 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 Atelectatic change again noted on the right. Stable cardiac silhouette. Electronically Signed   By: Lowella Grip III M.D.   On: 02/21/2020 11:25   DG CHEST PORT 1 VIEW  Result Date: 02/21/2020 CLINICAL DATA:  Known pneumothorax. History of lower extremity liposarcoma EXAM: PORTABLE CHEST 1 VIEW COMPARISON:  February 20, 2020 chest radiograph and chest CT February 06, 2020 FINDINGS: Previously noted pneumothorax on the left is unchanged without appreciable tension component. Subcutaneous air noted along the lateral left hemithorax.  Multiple mass lesions throughout the lungs, largest in the left upper lobe, remain without change. Atelectatic change noted in right mid lung. No new opacity evident. Heart is upper normal in size with pulmonary vascularity within normal limits. No adenopathy evident. Port-A-Cath tip in superior vena cava. No bone lesions. IMPRESSION: Stable left-sided pneumothorax that tension component. Multiple mass lesions throughout the lungs again noted, largest left upper lobe consistent with known neoplasm. Atelectasis right mid lung. No new opacity evident compared to 1 day prior. Stable cardiac silhouette. Electronically Signed   By: Lowella Grip III M.D.   On: 02/21/2020 08:13   DG Chest Port 1 View  Result Date: 02/20/2020 CLINICAL DATA:  Pneumothorax follow-up EXAM: PORTABLE CHEST 1 VIEW COMPARISON:  February 20, 2020 FINDINGS: There is a persistent unchanged left-sided pneumothorax. There is subcutaneous gas along the patient's left flank. There are airspace opacities involving the left upper lung zone and right lung base. There is a well-positioned right-sided Port-A-Cath. No definite right-sided pneumothorax. There is a small right-sided pleural effusion. IMPRESSION: No significant interval change. Persistent bilateral airspace opacities in addition to a moderate-sized left-sided pneumothorax. Electronically Signed   By: Constance Holster M.D.   On: 02/20/2020 18:47   DG Chest Port 1 View  Result Date: 02/12/2020 CLINICAL DATA:  60 year old male with history of pneumothorax status post chest tube placement. EXAM: PORTABLE CHEST 1 VIEW COMPARISON:  Chest x-ray 02/09/2020. FINDINGS: Right internal jugular single-lumen power porta cath with tip terminating at the superior cavoatrial junction. Left-sided chest tube in position with tip and side port projecting over the lower left hemithorax. Small persistent left pneumothorax, increased slightly in size compared to the prior study. No right-sided  pneumothorax. Multiple pulmonary nodules and masses again noted, better demonstrated on prior chest CT 02/06/2020. No definite acute consolidative airspace disease. No pleural effusions. No evidence of pulmonary edema. Heart size is normal. Upper mediastinal contours are within normal limits. Extensive subcutaneous emphysema in the left chest wall again noted. IMPRESSION: 1. Support apparatus, as above. 2. Increased size of small left-sided pneumothorax. 3. Multiple pulmonary nodules and masses, similar to prior examinations, presumably reflective of metastatic disease. Electronically Signed   By: Vinnie Langton M.D.   On: 02/12/2020 08:16   DG CHEST PORT 1 VIEW  Result Date: 02/09/2020 CLINICAL DATA:  Follow-up hydropneumothorax. Status post chest tube removal. EXAM: PORTABLE CHEST 1 VIEW COMPARISON:  02/09/2020 FINDINGS: Right chest wall port a catheter is noted with tip at the cavoatrial junction. The left-sided chest tube has been removed. Small left apical pneumothorax persists. Unchanged appearance of gas within the soft tissues of the left chest wall. Masslike opacities within the left upper lobe and right base are unchanged. IMPRESSION: Persistent small left apical pneumothorax status post left-sided chest tube removal. Electronically Signed   By: Lovena Le  Clovis Riley M.D.   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 Recent left pneumothorax. EXAM: PORTABLE CHEST 1 VIEW COMPARISON:  Portable chest 02/08/2020 and earlier. FINDINGS: Portable AP upright view at 0816 hours. Left chest tube is in place although there may be side holes outside of the left pleural space (arrow). A small volume of left chest wall gas has increased. Small residual left pneumothorax. No mediastinal shift. Masslike bilateral pulmonary opacities compatible with known metastases. Stable cardiac size and mediastinal contours. Visualized tracheal air  column is within normal limits. Stable right chest porta cath. Negative visible bowel gas pattern. Stable visualized osseous structures. IMPRESSION: 1. Left chest tube in place although there may be tube side holes outside of the pleural space. A small volume of chest wall gas has increased. 2. Stable small residual pneumothorax. 3. Stable pulmonary metastases. Electronically Signed   By: Genevie Ann M.D.   On: 02/09/2020 08:25   DG CHEST PORT 1 VIEW  Result Date: 02/08/2020 CLINICAL DATA:  Hydropneumothorax, chest tube on LEFT EXAM: PORTABLE CHEST 1 VIEW COMPARISON:  Portable exam 3762 hours compared to 02/07/2020 FINDINGS: Pigtail RIGHT thoracostomy tube, partially withdrawn since previous exam with tip projecting at LEFT costal margin. Persistent small LEFT apex pneumothorax. Basilar LEFT pleural effusion. Normal heart size mediastinal contours. RIGHT jugular Port-A-Cath stable tip projecting over SVC. Atelectasis versus infiltrate at RIGHT base. BILATERAL pulmonary nodules, largest a mass at LEFT apex. Bones demineralized. IMPRESSION: Persistent small LEFT hydropneumothorax. LEFT thoracostomy tube has been partially withdrawn with the pigtail now at LEFT costal margin. Electronically Signed   By: Lavonia Dana M.D.   On: 02/08/2020 15:11   DG Chest Portable 1 View  Result Date: 02/07/2020 CLINICAL DATA:  Follow-up left pneumothorax.  Lung carcinoma. EXAM: PORTABLE CHEST 1 VIEW COMPARISON:  Prior today FINDINGS: There has been placement of a left pleural pigtail catheter since previous study, with near complete resolution of left pneumothorax since prior exam. Multiple pulmonary nodules and masses are again seen in both lungs, largest in the left upper lobe and right lower lobe. Small right pleural effusion remains stable. Right-sided Port-A-Cath remains in appropriate position. IMPRESSION: Near complete resolution of left pneumothorax following left pleural catheter placement. Electronically Signed   By: Marlaine Hind M.D.   On: 02/07/2020 14:55   DG Chest Portable 1 View  Result Date: 02/07/2020 CLINICAL DATA:  Left pneumothorax. EXAM: PORTABLE CHEST 1 VIEW COMPARISON:  CT 02/06/2020. FINDINGS: Mediastinum hilar structures normal. Borderline cardiomegaly. Large left pneumothorax noted. Similar finding noted on prior CT. Prominent atelectasis left lung. Multiple prominent bilateral pulmonary masses again noted as noted on prior CT. No acute bony abnormality identified. Contrast in the colon. IMPRESSION: 1. Large left pneumothorax. Similar finding noted on prior CT. Prominent atelectasis left lung. 2. Multiple prominent bilateral pulmonary masses again noted as noted on prior CT. Electronically Signed   By: Marcello Moores  Register   On: 02/07/2020 13:50   DG Chest Port 1V same Day  Result Date: 02/09/2020 CLINICAL DATA:  Shortness of breath, history of metastatic lung carcinoma EXAM: PORTABLE CHEST 1 VIEW COMPARISON:  Film from earlier in the same day. FINDINGS: Cardiac shadow is stable. Left-sided chest tube is again identified. Small left apical pneumothorax is again identified stable in appearance. Stable left upper lobe mass lesion is noted. Nodules are noted in the right lung as well as patchy atelectatic changes in the bases bilaterally.  Considerable subcutaneous emphysema is noted. No bony abnormality is seen. Right chest wall port is noted. IMPRESSION: Stable left apical pneumothorax with chest tube in place. Changes of metastatic disease within the lungs. Electronically Signed   By: Inez Catalina M.D.   On: 02/09/2020 23:23   DG Chest Port 1V same Day  Result Date: 02/09/2020 CLINICAL DATA:  Pneumothorax.  Chest tube placement. EXAM: PORTABLE CHEST 1 VIEW COMPARISON:  February 09, 2020 FINDINGS: There has been interval placement of a left-sided chest tube. There is a small residual left-sided pneumothorax, improved from prior study. The right-sided Port-A-Cath is well position. Bilateral airspace opacities are  again noted, not substantially changed from prior study. There is subcutaneous emphysema along the patient's left flank, stable to improved from prior study. The heart size is unchanged. IMPRESSION: 1. Interval placement of a left-sided chest tube. 2. Small residual left-sided pneumothorax, improved from prior study. 3. Otherwise, no significant short interval change. Electronically Signed   By: Constance Holster M.D.   On: 02/09/2020 16:25   DG Chest Port 1V same Day  Result Date: 02/09/2020 CLINICAL DATA:  Chest tube removal EXAM: PORTABLE CHEST 1 VIEW COMPARISON:  02/09/2020 FINDINGS: Further increased size of the left pneumothorax, now noted laterally and at the left apex, approximately 15%. Right Port-A-Cath remains in place, unchanged. Airspace opacity at the right lung base is unchanged. Heart is borderline in size. Subcutaneous emphysema throughout the left chest wall. IMPRESSION: Enlarging left pneumothorax, now approximately 15%. Otherwise no change. These results will be called to the ordering clinician or representative by the Radiologist Assistant, and communication documented in the PACS or [employer]. Electronically Signed   By: Rolm Baptise M.D.   On: 02/09/2020 15:33    Microbiology: Recent Results (from the past 240 hour(s))  Resp Panel by RT-PCR (Flu A&B, Covid) Nasopharyngeal Swab     Status: None   Collection Time: 02/20/20  6:30 PM   Specimen: Nasopharyngeal Swab; Nasopharyngeal(NP) swabs in vial transport medium  Result Value Ref Range Status   SARS Coronavirus 2 by RT PCR NEGATIVE NEGATIVE Final    Comment: (NOTE) SARS-CoV-2 target nucleic acids are NOT DETECTED.  The SARS-CoV-2 RNA is generally detectable in upper respiratory specimens during the acute phase of infection. The lowest concentration of SARS-CoV-2 viral copies this assay can detect is 138 copies/mL. A negative result does not preclude SARS-Cov-2 infection and should not be used as the sole basis for  treatment or other patient management decisions. A negative result may occur with  improper specimen collection/handling, submission of specimen other than nasopharyngeal swab, presence of viral mutation(s) within the areas targeted by this assay, and inadequate number of viral copies(<138 copies/mL). A negative result must be combined with clinical observations, patient history, and epidemiological information. The expected result is Negative.  Fact Sheet for Patients:  EntrepreneurPulse.com.au  Fact Sheet for Healthcare Providers:  IncredibleEmployment.be  This test is no t yet approved or cleared by the [place] FDA and  has been authorized for detection and/or diagnosis of SARS-CoV-2 by FDA under an Emergency Use Authorization (EUA). This EUA will remain  in effect (meaning this test can be used) for the duration of the COVID-19 declaration under Section 564(b)(1) of the Act, 21 U.S.C.section 360bbb-3(b)(1), unless the authorization is terminated  or revoked sooner.       Influenza A by PCR NEGATIVE NEGATIVE Final   Influenza B by PCR NEGATIVE NEGATIVE Final    Comment: (NOTE) The Xpert Xpress SARS-CoV-2/FLU/RSV plus  assay is intended as an aid in the diagnosis of influenza from Nasopharyngeal swab specimens and should not be used as a sole basis for treatment. Nasal washings and aspirates are unacceptable for Xpert Xpress SARS-CoV-2/FLU/RSV testing.  Fact Sheet for Patients: EntrepreneurPulse.com.au  Fact Sheet for Healthcare Providers: IncredibleEmployment.be  This test is not yet approved or cleared by the [place] FDA and has been authorized for detection and/or diagnosis of SARS-CoV-2 by FDA under an Emergency Use Authorization (EUA). This EUA will remain in effect (meaning this test can be used) for the duration of the COVID-19 declaration under Section 564(b)(1) of the Act, 21  U.S.C. section 360bbb-3(b)(1), unless the authorization is terminated or revoked.  Performed at [facility], [place] [address], [place], [place] [address]      Labs: Basic Metabolic Panel: Recent Labs  Lab 02/25/20 0427 02/26/20 0644 02/27/20 0440 02/28/20 0330 02/29/20 0330  NA 137 137 135 139 138  K 3.9 4.7 4.4 3.8 4.1  CL 103 104 104 105 104  CO2 26 24 25 26 25   GLUCOSE 122* 170* 150* 92 95  BUN 12 13 17 15 15   CREATININE 0.57* 0.55* 0.66 0.60* 0.75  CALCIUM 8.6* 8.9 8.8* 8.7* 8.6*  MG 2.0 2.1  --   --   --   PHOS 4.3 3.5  --   --   --    Liver Function Tests: No results for input(s): AST, ALT, ALKPHOS, BILITOT, PROT, ALBUMIN in the last 168 hours. No results for input(s): LIPASE, AMYLASE in the last 168 hours. No results for input(s): AMMONIA in the last 168 hours. CBC: Recent Labs  Lab 02/25/20 0427 02/26/20 0644 02/27/20 0440 02/28/20 0330 02/29/20 0330  WBC 8.3 13.5* 16.4* 8.4 8.3  NEUTROABS  --  13.0*  --   --   --   HGB 11.4* 11.3* 10.7* 10.7* 11.5*  HCT 35.6* 33.8* 33.1* 32.2* 34.1*  MCV 98.9 96.6 98.2 96.7 96.9  PLT 261 262 268 242 233   Cardiac Enzymes: No results for input(s): CKTOTAL, CKMB, CKMBINDEX, TROPONINI in the last 168 hours. BNP: BNP (last 3 results) No results for input(s): BNP in the last 8760 hours.  ProBNP (last 3 results) No results for input(s): PROBNP in the last 8760 hours.  CBG: No results for input(s): GLUCAP in the last 168 hours.  Principal Problem:   Spontaneous pneumothorax Active Problems:   Esophageal dysphagia   Liposarcoma of left lower extremity (HCC)   Dehydration   Left-sided hydropneumothorax--associated with necrotic malignant mass  Time coordinating discharge: 38 minutes.  Signed:        Bethzaida Boord, DO [facility]  02/29/2020, 2:01 PM

## 2020-02-29 NOTE — TOC Transition Note (Addendum)
Transition of Care [facility]) - CM/SW Discharge Note   Patient Details  Name: Leonard [name] MRN: [id number] Date of Birth: 06-22-1960  [facility] CM/SW Contact:  Angelita Ingles, RN Phone Number: [phone]  02/29/2020, 12:47 PM   Clinical Narrative:    CM at bedside to offer choice for [facility] needs. HH has been set up with [facility]. No other HH needs noted at this time. TOC will sign off.    Final next level of care: Home w Home Health Services Barriers to Discharge: No Barriers Identified   Patient Goals and CMS Choice Patient states their goals for this hospitalization and ongoing recovery are:: Ready to go home with his wife CMS Medicare.gov Compare Post Acute Care list provided to:: Patient Choice offered to / list presented to : Patient  Discharge Placement                       Discharge Plan and Services                DME Arranged: N/A DME Agency: NA       HH Arranged: PT,OT [place] Agency: [facility] (now [facility]) Date [place]: 02/29/20 Time [place]: [place] Representative spoke with at [place]: [place]  Social Determinants of Health ([place]) Interventions     Readmission Risk Interventions No flowsheet data found.

## 2020-03-02 ENCOUNTER — Emergency Department (HOSPITAL_COMMUNITY): Payer: 59

## 2020-03-02 ENCOUNTER — Encounter (HOSPITAL_COMMUNITY): Payer: Self-pay | Admitting: Emergency Medicine

## 2020-03-02 ENCOUNTER — Inpatient Hospital Stay (HOSPITAL_COMMUNITY)
Admission: EM | Admit: 2020-03-02 | Discharge: 2020-03-06 | DRG: 200 | Disposition: A | Discharge status: Home Health | Payer: 59 | Attending: Internal Medicine | Admitting: Internal Medicine

## 2020-03-02 ENCOUNTER — Other Ambulatory Visit: Payer: Self-pay

## 2020-03-02 DIAGNOSIS — J9383 Other pneumothorax: Principal | ICD-10-CM | POA: Diagnosis present

## 2020-03-02 DIAGNOSIS — Z809 Family history of malignant neoplasm, unspecified: Secondary | ICD-10-CM

## 2020-03-02 DIAGNOSIS — Z821 Family history of blindness and visual loss: Secondary | ICD-10-CM

## 2020-03-02 DIAGNOSIS — Z4682 Encounter for fitting and adjustment of non-vascular catheter: Secondary | ICD-10-CM

## 2020-03-02 DIAGNOSIS — Z95828 Presence of other vascular implants and grafts: Secondary | ICD-10-CM

## 2020-03-02 DIAGNOSIS — C499 Malignant neoplasm of connective and soft tissue, unspecified: Secondary | ICD-10-CM | POA: Diagnosis not present

## 2020-03-02 DIAGNOSIS — C4922 Malignant neoplasm of connective and soft tissue of left lower limb, including hip: Secondary | ICD-10-CM | POA: Diagnosis present

## 2020-03-02 DIAGNOSIS — D6832 Hemorrhagic disorder due to extrinsic circulating anticoagulants: Secondary | ICD-10-CM | POA: Diagnosis present

## 2020-03-02 DIAGNOSIS — J948 Other specified pleural conditions: Secondary | ICD-10-CM | POA: Diagnosis present

## 2020-03-02 DIAGNOSIS — Z87891 Personal history of nicotine dependence: Secondary | ICD-10-CM | POA: Diagnosis not present

## 2020-03-02 DIAGNOSIS — C7951 Secondary malignant neoplasm of bone: Secondary | ICD-10-CM | POA: Diagnosis present

## 2020-03-02 DIAGNOSIS — Z515 Encounter for palliative care: Secondary | ICD-10-CM | POA: Diagnosis not present

## 2020-03-02 DIAGNOSIS — Z79899 Other long term (current) drug therapy: Secondary | ICD-10-CM | POA: Diagnosis not present

## 2020-03-02 DIAGNOSIS — W19XXXA Unspecified fall, initial encounter: Secondary | ICD-10-CM | POA: Diagnosis present

## 2020-03-02 DIAGNOSIS — Z20822 Contact with and (suspected) exposure to covid-19: Secondary | ICD-10-CM | POA: Diagnosis present

## 2020-03-02 DIAGNOSIS — C7802 Secondary malignant neoplasm of left lung: Secondary | ICD-10-CM | POA: Diagnosis present

## 2020-03-02 DIAGNOSIS — R918 Other nonspecific abnormal finding of lung field: Secondary | ICD-10-CM

## 2020-03-02 DIAGNOSIS — Z9221 Personal history of antineoplastic chemotherapy: Secondary | ICD-10-CM

## 2020-03-02 DIAGNOSIS — R1314 Dysphagia, pharyngoesophageal phase: Secondary | ICD-10-CM | POA: Diagnosis present

## 2020-03-02 DIAGNOSIS — K222 Esophageal obstruction: Secondary | ICD-10-CM | POA: Diagnosis present

## 2020-03-02 DIAGNOSIS — C7801 Secondary malignant neoplasm of right lung: Secondary | ICD-10-CM | POA: Diagnosis present

## 2020-03-02 DIAGNOSIS — Z560 Unemployment, unspecified: Secondary | ICD-10-CM | POA: Diagnosis not present

## 2020-03-02 DIAGNOSIS — C7989 Secondary malignant neoplasm of other specified sites: Secondary | ICD-10-CM | POA: Diagnosis present

## 2020-03-02 DIAGNOSIS — R1319 Other dysphagia: Secondary | ICD-10-CM | POA: Diagnosis not present

## 2020-03-02 DIAGNOSIS — Z8249 Family history of ischemic heart disease and other diseases of the circulatory system: Secondary | ICD-10-CM

## 2020-03-02 DIAGNOSIS — Z7189 Other specified counseling: Secondary | ICD-10-CM | POA: Diagnosis not present

## 2020-03-02 DIAGNOSIS — R042 Hemoptysis: Secondary | ICD-10-CM | POA: Diagnosis present

## 2020-03-02 DIAGNOSIS — R0600 Dyspnea, unspecified: Secondary | ICD-10-CM

## 2020-03-02 DIAGNOSIS — J939 Pneumothorax, unspecified: Secondary | ICD-10-CM

## 2020-03-02 LAB — RESP PANEL BY RT-PCR (FLU A&B, COVID) ARPGX2
Influenza A by PCR: NEGATIVE
Influenza B by PCR: NEGATIVE
SARS Coronavirus 2 by RT PCR: NEGATIVE

## 2020-03-02 LAB — BASIC METABOLIC PANEL
Anion gap: 6 (ref 5–15)
BUN: 13 mg/dL (ref 6–20)
CO2: 26 mmol/L (ref 22–32)
Calcium: 9.2 mg/dL (ref 8.9–10.3)
Chloride: 106 mmol/L (ref 98–111)
Creatinine, Ser: 0.61 mg/dL (ref 0.61–1.24)
GFR, Estimated: >60 mL/min (ref >60)
Glucose, Bld: 103 mg/dL — ABNORMAL HIGH (ref 70–99)
Potassium: 4.4 mmol/L (ref 3.5–5.1)
Sodium: 138 mmol/L (ref 135–145)

## 2020-03-02 LAB — CBC
HCT: 44.4 % (ref 39.0–52.0)
Hemoglobin: 14.2 g/dL (ref 13.0–17.0)
MCH: 31.7 pg (ref 26.0–34.0)
MCHC: 32 g/dL (ref 30.0–36.0)
MCV: 99.1 fL (ref 80.0–100.0)
Platelets: 295 10*3/uL (ref 150–400)
RBC: 4.48 MIL/uL (ref 4.22–5.81)
RDW: 15.4 % (ref 11.5–15.5)
WBC: 7.7 10*3/uL (ref 4.0–10.5)
nRBC: 0 % (ref 0.0–0.2)

## 2020-03-02 MED ORDER — ESCITALOPRAM OXALATE 10 MG PO TABS
10.0000 mg | ORAL_TABLET | Freq: Every day | ORAL | Status: DC
  Administered 2020-03-02 – 2020-03-05 (×4): 10 mg via ORAL
  Filled 2020-03-02 (×5): qty 1

## 2020-03-02 MED ORDER — ZOLPIDEM TARTRATE 5 MG PO TABS
10.0000 mg | ORAL_TABLET | Freq: Every evening | ORAL | Status: DC | PRN
  Administered 2020-03-02 – 2020-03-05 (×4): 10 mg via ORAL
  Filled 2020-03-02 (×4): qty 2

## 2020-03-02 MED ORDER — FENTANYL CITRATE (PF) 100 MCG/2ML IJ SOLN
150.0000 ug | Freq: Once | INTRAMUSCULAR | Status: AC
  Administered 2020-03-02: 150 ug via INTRAVENOUS
  Filled 2020-03-02: qty 4

## 2020-03-02 MED ORDER — FENTANYL CITRATE (PF) 100 MCG/2ML IJ SOLN
100.0000 ug | Freq: Once | INTRAMUSCULAR | Status: AC | PRN
Start: 2020-03-02 — End: 2020-03-02
  Administered 2020-03-02: 100 ug via INTRAVENOUS
  Filled 2020-03-02: qty 2

## 2020-03-02 MED ORDER — LIDOCAINE HCL (PF) 2 % IJ SOLN
INTRAMUSCULAR | Status: AC
  Administered 2020-03-02: 20 mL via INTRADERMAL
  Filled 2020-03-02: qty 10

## 2020-03-02 MED ORDER — BENZONATATE 100 MG PO CAPS
200.0000 mg | ORAL_CAPSULE | Freq: Three times a day (TID) | ORAL | Status: DC
  Administered 2020-03-02 – 2020-03-06 (×11): 200 mg via ORAL
  Filled 2020-03-02 (×11): qty 2

## 2020-03-02 MED ORDER — MORPHINE SULFATE (PF) 2 MG/ML IV SOLN
2.0000 mg | INTRAVENOUS | Status: DC | PRN
  Administered 2020-03-03 – 2020-03-05 (×6): 2 mg via INTRAVENOUS
  Filled 2020-03-02 (×6): qty 1

## 2020-03-02 MED ORDER — LIDOCAINE HCL (PF) 2 % IJ SOLN: 20.0000 mL | Freq: Once | INTRAMUSCULAR | Status: AC

## 2020-03-02 MED ORDER — HYDROCODONE-HOMATROPINE 5-1.5 MG/5ML PO SYRP
10.0000 mL | ORAL_SOLUTION | Freq: Four times a day (QID) | ORAL | Status: DC | PRN
  Administered 2020-03-03 – 2020-03-06 (×11): 10 mL via ORAL
  Filled 2020-03-02 (×13): qty 10

## 2020-03-02 MED ORDER — HYDROCODONE-ACETAMINOPHEN 5-325 MG PO TABS
1.0000 | ORAL_TABLET | ORAL | Status: DC | PRN
  Administered 2020-03-02 – 2020-03-03 (×3): 2 via ORAL
  Filled 2020-03-02 (×3): qty 2

## 2020-03-02 MED ORDER — GABAPENTIN 300 MG PO CAPS
300.0000 mg | ORAL_CAPSULE | Freq: Two times a day (BID) | ORAL | Status: DC
  Administered 2020-03-02 – 2020-03-06 (×8): 300 mg via ORAL
  Filled 2020-03-02 (×8): qty 1

## 2020-03-02 MED ORDER — ALPRAZOLAM 0.25 MG PO TABS: 0.2500 mg | ORAL_TABLET | Freq: Two times a day (BID) | ORAL | Status: DC | PRN

## 2020-03-02 MED ORDER — PROCHLORPERAZINE MALEATE 10 MG PO TABS
10.0000 mg | ORAL_TABLET | Freq: Four times a day (QID) | ORAL | Status: DC | PRN
  Filled 2020-03-02: qty 1

## 2020-03-02 NOTE — ED Triage Notes (Signed)
Pt from home via [facility] with C/O SHOB. Pt reports he has chest tube removed "on Wednesday or Thursday." Pt reports chest pain and SHOB that started this morning.

## 2020-03-02 NOTE — ED Provider Notes (Signed)
[facility] EMERGENCY DEPARTMENT Provider Note   CSN: [id number] Arrival date & time: 03/02/20  1440     History Chief Complaint  Patient presents with   Shortness of Breath    Leonard [name] is a 60 y.o. male with history of metastatic lung cancer, recurrent left-sided hydropneumothorax, necrotic LLL mass, presenting to emergency department with shortness of breath.  The patient ports he woke up feeling short of breath this morning with diffuse chest pain, but worse with inspiration, worse along his left rib line.  He feels this has been progressing throughout the day.  He has been in the hospital recently for recurrent hydropneumothorax, with pigtail decompression - now removed - in the past month.  He has a Production assistant, radio in place.  He reports he was previously on chemotherapy but this is been discontinued by his oncologist because "I was not responding well to it".  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   Leg mass, left 08/02/2018   Localized swelling of left lower leg 08/01/2018   Food impaction of esophagus 06/27/2017   NSAID induced gastritis    Esophageal dysphagia     Past Surgical History:  Procedure Laterality Date   CHOLECYSTECTOMY     ESOPHAGEAL DILATION N/A 06/27/2017   Procedure: ESOPHAGEAL DILATION;  Surgeon: Danie Binder, MD;  Location: [facility];  Service: Endoscopy;  Laterality: N/A;    ESOPHAGOGASTRODUODENOSCOPY N/A 06/27/2017   Procedure: ESOPHAGOGASTRODUODENOSCOPY (EGD);  Surgeon: Danie Binder, MD;  Location: [facility] SU[facility];  Service: Endoscopy;  Laterality: N/A;   IR IMAGING GUIDED PORT INSERTION  05/04/2019   knee sx         Family History  Problem Relation Age of Onset   Heart disease Father    Cancer Maternal Aunt    Cancer Maternal Uncle    Cancer Paternal Uncle    Vision loss Maternal Grandfather    Colon cancer Neg Hx    Colon polyps Neg Hx     Social History   Tobacco Use   Smoking status: Former Smoker   Smokeless tobacco: Never Used  Scientific laboratory technician Use: Never used  Substance Use Topics   Alcohol use: Yes    Comment: 1-2 drinks per month   Drug use: Never    Home Medications Prior to Admission medications   Medication Sig Start Date End Date Taking? Authorizing Provider  ALPRAZolam (XANAX) 0.25 MG tablet Take 1 tablet (0.25 mg total) by mouth 2 (two) times daily as needed for anxiety. Patient taking differently: Take 0.25 mg by mouth See admin instructions. Take 0.25 mg by mouth at bedtime and an additional 0.25 mg once a day as needed for anxiety 10/30/19  Yes [name], MD  benzonatate (TESSALON) 200 MG capsule Take 1 capsule (200 mg total) by mouth 3 (three) times daily. 02/20/20  Yes [name], MD  escitalopram (LEXAPRO) 10 MG tablet Take 1 tablet (10 mg total) by mouth  daily. Patient taking differently: Take 10 mg by mouth at bedtime. 12/25/19  Yes Jacquelin Hawking, NP  gabapentin (NEURONTIN) 300 MG capsule Take 1 capsule (300 mg total) by mouth 3 (three) times daily. Patient taking differently: Take 300 mg by mouth in the morning and at bedtime. 02/16/20 03/17/20 Yes Mikhail, Velta Addison, DO  HYDROcodone-homatropine (HYCODAN) 5-1.5 MG/5ML syrup Take 10 mLs by mouth every 6 (six) hours as needed for cough. SMARTSIG:1 Teaspoon By Mouth Every 6 Hours PRN Patient taking differently: Take 10 mLs by mouth every 6  (six) hours as needed for cough. 02/20/20  Yes [name], MD  Oxycodone HCl 10 MG TABS Take 0.5 tablets (5 mg total) by mouth every 8 (eight) hours as needed. Patient taking differently: Take 5 mg by mouth every 8 (eight) hours as needed (for moderate to severe pain). 01/03/20  Yes Tanner, Lyndon Code., PA-C  prochlorperazine (COMPAZINE) 10 MG tablet TAKE (1) TABLET BY MOUTH EVERY (6) HOURS AS NEEDED. Patient taking differently: Take 10 mg by mouth every 6 (six) hours as needed for nausea or vomiting. 01/02/20  Yes Iruku, Arletha Pili, MD  zolpidem (AMBIEN) 10 MG tablet Take 1 tablet (10 mg total) by mouth at bedtime as needed for sleep. Patient taking differently: Take 10 mg by mouth at bedtime. 01/31/20 03/01/20 Yes [name], MD  aluminum-magnesium hydroxide-simethicone (MAALOX) 200-200-20 MG/5ML SUSP Swish and swallow 5 ml four times daily as needed for mouth sores. Patient not taking: No sig reported 09/21/19   [name], MD  docusate sodium (COLACE) 100 MG capsule Take 1 capsule (100 mg total) by mouth 2 (two) times daily. Hold for more than 2 bowel movements in 24 hours. Patient not taking: No sig reported 02/29/20   Swayze, Ava, DO  pantoprazole (PROTONIX) 40 MG tablet Take 1 tablet (40 mg total) by mouth 2 (two) times daily before a meal. Patient not taking: Reported on 03/02/2020 02/16/20 03/17/20  Cristal Ford, DO  polyethylene glycol (MIRALAX / GLYCOLAX) 17 g packet Take 17 g by mouth daily. Patient not taking: Reported on 03/02/2020 03/01/20   Swayze, Ava, DO    Allergies    Patient has no known allergies.  Review of Systems   Review of Systems  Constitutional: Negative for chills and fever.  Respiratory: Positive for cough, chest tightness and shortness of breath.   Cardiovascular: Negative for chest pain and palpitations.  Gastrointestinal: Negative for abdominal pain and vomiting.  Genitourinary: Negative for dysuria and hematuria.  Musculoskeletal: Negative  for arthralgias and back pain.  Skin: Negative for color change and rash.  Neurological: Negative for syncope and speech difficulty.  All other systems reviewed and are negative.   Physical Exam Updated Vital Signs BP 123/87    Pulse 79    Temp 98.6 F (37 C) (Oral)    Resp 14    Ht 5\' 10"  (1.778 m)    Wt 90.7 kg    SpO2 94%    BMI 28.70 kg/m   Physical Exam Constitutional:      General: He is not in acute distress. HENT:     Head: Normocephalic and atraumatic.  Eyes:     Conjunctiva/sclera: Conjunctivae normal.     Pupils: Pupils are equal, round, and reactive to light.  Cardiovascular:     Rate and Rhythm: Normal rate and regular rhythm.  Pulmonary:     Effort: Pulmonary effort is normal. No respiratory distress.     Comments: 95% on room air, supplemental oxygen started for PTX  Speaking comfortably in full sentences No tachynea or retractions with breathing Diminished BS left lung diffusely Good air movement right lung Abdominal:     General: There is no distension.  Musculoskeletal:     Comments: Right chest wall port-a-cath in position  Skin:    General: Skin is warm and dry.     Comments: Former chest tube/pigtail sites noted in left chest wall  Neurological:     General: No focal deficit present.     Mental Status: He is alert. Mental status is at baseline.  Psychiatric:        Mood and Affect: Mood normal.        Behavior: Behavior normal.     ED Results / Procedures / Treatments   Labs (all labs ordered are listed, but only abnormal results are displayed) Labs Reviewed  BASIC METABOLIC PANEL - Abnormal; Notable for the following components:      Result Value   Glucose, Bld 103 (*)    All other components within normal limits  RESP PANEL BY RT-PCR (FLU A&B, COVID) ARPGX2  MRSA PCR SCREENING  CBC  BASIC METABOLIC PANEL  CBC    EKG None  Radiology DG Chest Portable 1 View  Result Date: 03/02/2020 CLINICAL DATA:  Post chest tube placement. EXAM:  PORTABLE CHEST 1 VIEW COMPARISON:  X-ray earlier in the same day FINDINGS: There is been interval placement of a left-sided chest tube with near complete resolution of the left-sided pneumothorax. There is subcutaneous gas along the patient's left flank. There are small bilateral pleural effusions. There are persistent bilateral airspace opacities, greatest within the right lower lung zone and left upper lobe. The right-sided Port-A-Cath is stable in positioning. The heart size is stable. IMPRESSION: Near complete resolution of the previously seen left-sided pneumothorax status post chest tube placement. Persistent bilateral airspace opacities and small bilateral pleural effusions. Electronically Signed   By: Constance Holster M.D.   On: 03/02/2020 19:08   DG Chest Portable 1 View  Result Date: 03/02/2020 CLINICAL DATA:  60 year old male with a history of shortness of breath EXAM: PORTABLE CHEST 1 VIEW COMPARISON:  02/27/2020 FINDINGS: Cardiomediastinal silhouette unchanged in size and contour. Recurrent left-sided pneumothorax status post left chest tube removal. Opacity within the left lung, particularly at the apex. Reticulonodular opacities of the right lung, unchanged. Right IJ port catheter unchanged. No right-sided pneumothorax or pleural effusion. IMPRESSION: Recurrent left-sided pneumothorax, status post left pigtail thoracostomy tube removal. These results were discussed by telephone at the time of interpretation on 03/02/2020 at 3:24 pm with the emergency room staff. Similar appearance of nodular opacities of the lung in this patient with known metastatic lung carcinoma. Unchanged right IJ port. Electronically Signed   By: Corrie Mckusick D.O.   On: 03/02/2020 15:25    Procedures CHEST TUBE INSERTION  Date/Time: 03/03/2020 12:56 AM Performed by: Wyvonnia Dusky, MD Authorized by: Wyvonnia Dusky, MD   Consent:    Consent obtained:  Verbal   Consent given by:  Patient   Risks, benefits,  and alternatives were discussed: yes     Risks discussed:  Bleeding, incomplete drainage, pain, infection and damage to surrounding structures   Alternatives discussed:  Observation and delayed treatment Universal protocol:    Procedure explained and questions answered to patient or proxy's satisfaction: yes     Relevant documents present and verified: yes     Test results available: yes     Imaging studies available: yes  Required blood products, implants, devices, and special equipment available: yes     Site/side marked: yes     Immediately prior to procedure, a time out was called: yes     Patient identity confirmed:  Arm band Pre-procedure details:    Skin preparation:  Chlorhexidine   Preparation: Patient was prepped and draped in the usual sterile fashion   Sedation:    Sedation type:  Anxiolysis Anesthesia:    Anesthesia method:  Local infiltration   Local anesthetic:  Lidocaine 1% w/o epi Procedure details:    Placement location:  L lateral   Tube size ([place]): Pigtail.   Ultrasound guidance: no     Tension pneumothorax: no     Tube connected to:  Suction   Drainage characteristics:  Serosanguinous   Suture material:  2-0 silk   Dressing:  4x4 sterile gauze Post-procedure details:    Post-insertion x-ray findings: tube in good position     Procedure completion:  Tolerated well, no immediate complications     Medications Ordered in ED Medications  ALPRAZolam (XANAX) tablet 0.25 mg (has no administration in time range)  escitalopram (LEXAPRO) tablet 10 mg (10 mg Oral Given 03/02/20 2317)  prochlorperazine (COMPAZINE) tablet 10 mg (has no administration in time range)  zolpidem (AMBIEN) tablet 10 mg (10 mg Oral Given 03/02/20 2317)  gabapentin (NEURONTIN) capsule 300 mg (300 mg Oral Given 03/02/20 2317)  benzonatate (TESSALON) capsule 200 mg (200 mg Oral Given 03/02/20 2317)  HYDROcodone-homatropine (HYCODAN) 5-1.5 MG/5ML syrup 10 mL (has no administration in time  range)  HYDROcodone-acetaminophen (NORCO/VICODIN) 5-325 MG per tablet 1-2 tablet (2 tablets Oral Given 03/02/20 2037)  morphine 2 MG/ML injection 2 mg (has no administration in time range)  fentaNYL (SUBLIMAZE) injection 100 mcg (100 mcg Intravenous Given 03/02/20 1649)  lidocaine HCl (PF) (XYLOCAINE) 2 % injection 20 mL (20 mLs Intradermal Given by Other 03/02/20 1835)  fentaNYL (SUBLIMAZE) injection 150 mcg (150 mcg Intravenous Given 03/02/20 1805)    ED Course  I have reviewed the triage vital signs and the nursing notes.  Pertinent labs & imaging results that were available during my care of the patient were reviewed by me and considered in my medical decision making (see chart for details).  60 yo male here with recurrent left sided spontaneous pneumothorax in setting of left sided lung mass from metastatic cancer.  Patient stable on arrival -no evidence of tension PTX.  No hypoxia.  Xrays reviewed with moderate left sided PTX.  I discussed with the ICU (Dr Elsworth Soho) management options and agreed to place a pigtail catheter in the ED for release of his pneumothorax, and subsequent admission to the hospitalist.  He is not requiring an ICU at this time. It is not clear whether he will be a candidate for any further surgical procedures from reviewing his last hospital records.  Labs today reviewed - BMP and cbc unremarkable.  Covid negative.  IV fentanyl in repeat doses ordered for pain control and anxiolysis.  I consented the patient for pigtail catheter in the ED, discussing alternatives including watchful waiting.  My recommendation was to proceed with the pigtail as this PTX is moderately sized and could rapidly expand, leading to acute decompensation.  I discussed the risks and benefits including infection, failed procedure, pain, and injury to lungs and other structures.  He wished to proceed with the pigtail.  Chest tube placed on first attempt, air leak noted in pleurovac, set to  suction.  I opted to use  a new lateral axillary site for placement given his prior approach was anterior and low, which raised risk of abdominal perforation or obstruction by his lung mass.  Repeat films with resolution of PTX.  Patient admitted to hospitalist in stable condition and pain well controlled.  Clinical Course as of 03/03/20 0103  Sat Mar 02, 2020  1548 PTX noted on xray.  Patient stable at this time, BP stable and not hypoxic.  I've asked nursing to gather materials for pigtail placement [MT]  1846 Pigtail tube placed, subsequent improvement of PTX on repeat xray.  Pt remains stable - will admit to hospitalist [MT]  1904 Signed out to hospitalist [MT]  1906 Admitted to Dr Jamse Arn hospitalist with plans for likely transfer to [facility] [MT]    Clinical Course User Index [MT] Wyvonnia Dusky, MD    Final Clinical Impression(s) / ED Diagnoses Final diagnoses:  Spontaneous pneumothorax    Rx / DC Orders ED Discharge Orders    None       Wyvonnia Dusky, MD 03/03/20 [phone]

## 2020-03-02 NOTE — H&P (Signed)
History and Physical:    Leonard [name]   HER:[id number] DOB: Apr 02, 1960 DOA: 03/02/2020  Referring MD/provider: Dr. Langston Masker PCP: Celene Squibb, MD   Patient coming from: Home  Chief Complaint: Recurrent pneumothorax  History of Present Illness:   Leonard [name] is an 60 y.o. 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  He had been hospitalized January 26 for recurrent LLL hydropneumothorax and was treated with pigtail chest tube and then large bore chest tube.  Chest tube was removed but he was readmitted on February 8 with recurrent hydropneumothorax after coughing.  Chest tube was replaced on February 9. He was seen by CT surgery who did not think he was a surgical candidate for any further intervention.  He underwent talc pleurodesis on February 12 secondary to pain.  Chest tube was removed on February 15 and patient was discharged without chest tube on February 17. Hospital course was complicated by diarrhea food impaction in the esophagus which was dilated.  Patient also noted to have some hemoptysis, all anticoagulation has been held and PCCM recommends angiography and embolization by IR should recur.  Patient now presents complaining of recurrent shortness of breath and pain after coughing this morning.  He notes this is exactly what happened the last time.   ED Course:  The patient was seen in the ED and it was noted that his pneumothorax had recurred.  He was discussed with PCCM who recommended pigtail catheter placement.  This was placed in the ED with improvement in symptoms and apparent improvement in his chest x-ray.  ROS:   ROS   Review of Systems: General: Denies fever, chills, malaise,  Cardiovascular: Denies palpitations GI: Denies nausea, vomiting, diarrhea or constipation GU: Denies dysuria, frequency or hematuria   Past Medical History:   Past  Medical History:  Diagnosis Date  . Cancer [facility])    metastatic lung cancer  . Port-A-Cath in place 05/19/2019   right    Past Surgical History:   Past Surgical History:  Procedure Laterality Date  . CHOLECYSTECTOMY    . ESOPHAGEAL DILATION N/A 06/27/2017   Procedure: ESOPHAGEAL DILATION;  Surgeon: Danie Binder, MD;  Location: [facility];  Service: Endoscopy;  Laterality: N/A;  . ESOPHAGOGASTRODUODENOSCOPY N/A 06/27/2017   Procedure: ESOPHAGOGASTRODUODENOSCOPY (EGD);  Surgeon: Danie Binder, MD;  Location: [facility];  Service: Endoscopy;  Laterality: N/A;  . IR IMAGING GUIDED PORT INSERTION  05/04/2019  . knee sx      Social History:   Social History   Socioeconomic History  . Marital status: Married    Spouse name: Not on file  . Number of children: 1  . Years of education: Not on file  . Highest education level: Not on file  Occupational History  . Occupation: Unemployed  Tobacco Use  . Smoking status: Former Research scientist (life sciences)  . Smokeless tobacco: Never Used  Vaping Use  . Vaping Use: Never used  Substance and Sexual Activity  . Alcohol use: Yes    Comment: 1-2 drinks per month  . Drug use: Never  . Sexual activity: Not Currently  Other Topics Concern  . Not on file  Social History Narrative   DOES CONSTRUCTION. MARRIED. RARE ETOH. NO TOBACCO.   Social Determinants of Health   Financial Resource Strain: Medium Risk  . Difficulty of Paying Living Expenses: Somewhat hard  Food Insecurity:  No Food Insecurity  . Worried About Charity fundraiser in the Last Year: Never true  . Ran Out of Food in the Last Year: Never true  Transportation Needs: No Transportation Needs  . Lack of Transportation (Medical): No  . Lack of Transportation (Non-Medical): No  Physical Activity: Inactive  . Days of Exercise per Week: 0 days  . Minutes of Exercise per Session: 0 min  Stress: Stress Concern Present  . Feeling of Stress : To some extent  Social Connections: Moderately  Integrated  . Frequency of Communication with Friends and Family: More than three times a week  . Frequency of Social Gatherings with Friends and Family: Once a week  . Attends Religious Services: More than 4 times per year  . Active Member of Clubs or Organizations: No  . Attends Archivist Meetings: Never  . Marital Status: Married  Human resources officer Violence: Not At Risk  . Fear of Current or Ex-Partner: No  . Emotionally Abused: No  . Physically Abused: No  . Sexually Abused: No    Allergies   Patient has no known allergies.  Family history:   Family History  Problem Relation Age of Onset  . Heart disease Father   . Cancer Maternal Aunt   . Cancer Maternal Uncle   . Cancer Paternal Uncle   . Vision loss Maternal Grandfather   . Colon cancer Neg Hx   . Colon polyps Neg Hx     Current Medications:   Prior to Admission medications   Medication Sig Start Date End Date Taking? Authorizing Provider  ALPRAZolam (XANAX) 0.25 MG tablet Take 1 tablet (0.25 mg total) by mouth 2 (two) times daily as needed for anxiety. Patient taking differently: Take 0.25 mg by mouth See admin instructions. Take 0.25 mg by mouth at bedtime and an additional 0.25 mg once a day as needed for anxiety 10/30/19  Yes [name], MD  benzonatate (TESSALON) 200 MG capsule Take 1 capsule (200 mg total) by mouth 3 (three) times daily. 02/20/20  Yes [name], MD  escitalopram (LEXAPRO) 10 MG tablet Take 1 tablet (10 mg total) by mouth daily. Patient taking differently: Take 10 mg by mouth at bedtime. 12/25/19  Yes Jacquelin Hawking, NP  gabapentin (NEURONTIN) 300 MG capsule Take 1 capsule (300 mg total) by mouth 3 (three) times daily. Patient taking differently: Take 300 mg by mouth in the morning and at bedtime. 02/16/20 03/17/20 Yes Mikhail, Velta Addison, DO  HYDROcodone-homatropine (HYCODAN) 5-1.5 MG/5ML syrup Take 10 mLs by mouth every 6 (six) hours as needed for cough. SMARTSIG:1  Teaspoon By Mouth Every 6 Hours PRN Patient taking differently: Take 10 mLs by mouth every 6 (six) hours as needed for cough. 02/20/20  Yes [name], MD  Oxycodone HCl 10 MG TABS Take 0.5 tablets (5 mg total) by mouth every 8 (eight) hours as needed. Patient taking differently: Take 5 mg by mouth every 8 (eight) hours as needed (for moderate to severe pain). 01/03/20  Yes Tanner, Lyndon Code., PA-C  prochlorperazine (COMPAZINE) 10 MG tablet TAKE (1) TABLET BY MOUTH EVERY (6) HOURS AS NEEDED. Patient taking differently: Take 10 mg by mouth every 6 (six) hours as needed for nausea or vomiting. 01/02/20  Yes Iruku, Arletha Pili, MD  zolpidem (AMBIEN) 10 MG tablet Take 1 tablet (10 mg total) by mouth at bedtime as needed for sleep. Patient taking differently: Take 10 mg by mouth at bedtime. 01/31/20 03/01/20 Yes [name], MD  aluminum-magnesium hydroxide-simethicone (  MAALOX) 200-200-20 MG/5ML SUSP Swish and swallow 5 ml four times daily as needed for mouth sores. Patient not taking: No sig reported 09/21/19   [name], MD  docusate sodium (COLACE) 100 MG capsule Take 1 capsule (100 mg total) by mouth 2 (two) times daily. Hold for more than 2 bowel movements in 24 hours. Patient not taking: No sig reported 02/29/20   Swayze, Ava, DO  pantoprazole (PROTONIX) 40 MG tablet Take 1 tablet (40 mg total) by mouth 2 (two) times daily before a meal. Patient not taking: Reported on 03/02/2020 02/16/20 03/17/20  Cristal Ford, DO  polyethylene glycol (MIRALAX / GLYCOLAX) 17 g packet Take 17 g by mouth daily. Patient not taking: Reported on 03/02/2020 03/01/20   Karie Kirks, DO    Physical Exam:   Vitals:   03/02/20 1630 03/02/20 1800 03/02/20 1830 03/02/20 1900  BP: (!) 127/104 (!) 135/98 (!) 126/91 (!) 118/91  Pulse: 87 84 81 80  Resp: 17 20 18 19   Temp:      TempSrc:      SpO2: 99% 96% 93% 93%     Physical Exam: Blood pressure (!) 118/91, pulse 80, temperature 98.8 F (37.1 C),  temperature source Oral, resp. rate 19, SpO2 93 %. Gen: Chronically ill-appearing man looking older than stated age sitting up in bed with some shallow tachypnea, unlabored. Eyes: sclera anicteric, conjuctiva mildly injected bilaterally CVS: S1-S2, regulary, no gallops Respiratory:  decreased air entry bilaterally with some coarse breath sounds.   GI: NABS, soft, NT  LE: No edema. No cyanosis Neuro: A/O x 3,grossly nonfocal.  Psych: mood and affect appropriate to situation.   Data Review:    Labs: Basic Metabolic Panel: Recent Labs  Lab 02/25/20 0427 02/26/20 0644 02/27/20 0440 02/28/20 0330 02/29/20 0330 03/02/20 1543  NA 137 137 135 139 138 138  K 3.9 4.7 4.4 3.8 4.1 4.4  CL 103 104 104 105 104 106  CO2 26 24 25 26 25 26   GLUCOSE 122* 170* 150* 92 95 103*  BUN 12 13 17 15 15 13   CREATININE 0.57* 0.55* 0.66 0.60* 0.75 0.61  CALCIUM 8.6* 8.9 8.8* 8.7* 8.6* 9.2  MG 2.0 2.1  --   --   --   --   PHOS 4.3 3.5  --   --   --   --    Liver Function Tests: No results for input(s): AST, ALT, ALKPHOS, BILITOT, PROT, ALBUMIN in the last 168 hours. No results for input(s): LIPASE, AMYLASE in the last 168 hours. No results for input(s): AMMONIA in the last 168 hours. CBC: Recent Labs  Lab 02/26/20 0644 02/27/20 0440 02/28/20 0330 02/29/20 0330 03/02/20 1543  WBC 13.5* 16.4* 8.4 8.3 7.7  NEUTROABS 13.0*  --   --   --   --   HGB 11.3* 10.7* 10.7* 11.5* 14.2  HCT 33.8* 33.1* 32.2* 34.1* 44.4  MCV 96.6 98.2 96.7 96.9 99.1  PLT 262 268 242 233 295   Cardiac Enzymes: No results for input(s): CKTOTAL, CKMB, CKMBINDEX, TROPONINI in the last 168 hours.  BNP (last 3 results) No results for input(s): PROBNP in the last 8760 hours. CBG: No results for input(s): GLUCAP in the last 168 hours.  Urinalysis No results found for: COLORURINE, APPEARANCEUR, LABSPEC, PHURINE, GLUCOSEU, HGBUR, BILIRUBINUR, KETONESUR, PROTEINUR, UROBILINOGEN, NITRITE, LEUKOCYTESUR    Radiographic  Studies: DG Chest Portable 1 View  Result Date: 03/02/2020 CLINICAL DATA:  Post chest tube placement. EXAM: PORTABLE CHEST 1 VIEW COMPARISON:  X-ray earlier in the same day FINDINGS: There is been interval placement of a left-sided chest tube with near complete resolution of the left-sided pneumothorax. There is subcutaneous gas along the patient's left flank. There are small bilateral pleural effusions. There are persistent bilateral airspace opacities, greatest within the right lower lung zone and left upper lobe. The right-sided Port-A-Cath is stable in positioning. The heart size is stable. IMPRESSION: Near complete resolution of the previously seen left-sided pneumothorax status post chest tube placement. Persistent bilateral airspace opacities and small bilateral pleural effusions. Electronically Signed   By: Constance Holster M.D.   On: 03/02/2020 19:08   DG Chest Portable 1 View  Result Date: 03/02/2020 CLINICAL DATA:  60 year old male with a history of shortness of breath EXAM: PORTABLE CHEST 1 VIEW COMPARISON:  02/27/2020 FINDINGS: Cardiomediastinal silhouette unchanged in size and contour. Recurrent left-sided pneumothorax status post left chest tube removal. Opacity within the left lung, particularly at the apex. Reticulonodular opacities of the right lung, unchanged. Right IJ port catheter unchanged. No right-sided pneumothorax or pleural effusion. IMPRESSION: Recurrent left-sided pneumothorax, status post left pigtail thoracostomy tube removal. These results were discussed by telephone at the time of interpretation on 03/02/2020 at 3:24 pm with the emergency room staff. Similar appearance of nodular opacities of the lung in this patient with known metastatic lung carcinoma. Unchanged right IJ port. Electronically Signed   By: Corrie Mckusick D.O.   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  Recurrent pneumothorax Pigtail catheter has been placed and patient is somewhat more comfortable. I will send patient back to [facility] for further [facility]CCM involvement. If there is nothing further that they can offer, this needs to be clearly stated to the patient who stated "what am I supposed to do?"  when I noted the treatment options were very limited.  Metastatic liposarcoma Patient is on palliative chemotherapy with no further treatment options I tried to address goals of care with the patient however seem to be somewhat in denial. Would recommend palliative care consult  Esophageal dysphagia Patient is required esophageal dilatation after food impaction at last hospital stay No difficulty with swallowing at present  Hemoptysis Continue to keep patient off all anticoagulation PCCM recommended IR if this were to recur  Diarrhea Patient had diarrhea in the last hospital stay however this does not seem to be a problem at present.    Other information:   DVT prophylaxis: SCD ordered. Code Status: Full Family Communication: Patient states his wife knows he is here Disposition Plan: Home Consults called: None Admission status: Inpatient  Carmellia Kreisler Tublu Kaisey Huseby [facility]  If 7PM-7AM, please contact night-coverage [email or web address] Password [facility] 03/02/2020, 8:02 PM

## 2020-03-03 DIAGNOSIS — J939 Pneumothorax, unspecified: Secondary | ICD-10-CM

## 2020-03-03 LAB — BASIC METABOLIC PANEL
Anion gap: 10 (ref 5–15)
BUN: 14 mg/dL (ref 6–20)
CO2: 22 mmol/L (ref 22–32)
Calcium: 8.7 mg/dL — ABNORMAL LOW (ref 8.9–10.3)
Chloride: 105 mmol/L (ref 98–111)
Creatinine, Ser: 0.71 mg/dL (ref 0.61–1.24)
GFR, Estimated: >60 mL/min (ref >60)
Glucose, Bld: 79 mg/dL (ref 70–99)
Potassium: 3.7 mmol/L (ref 3.5–5.1)
Sodium: 137 mmol/L (ref 135–145)

## 2020-03-03 LAB — CBC
HCT: 39.9 % (ref 39.0–52.0)
Hemoglobin: 12.7 g/dL — ABNORMAL LOW (ref 13.0–17.0)
MCH: 31.3 pg (ref 26.0–34.0)
MCHC: 31.8 g/dL (ref 30.0–36.0)
MCV: 98.3 fL (ref 80.0–100.0)
Platelets: 248 10*3/uL (ref 150–400)
RBC: 4.06 MIL/uL — ABNORMAL LOW (ref 4.22–5.81)
RDW: 15.4 % (ref 11.5–15.5)
WBC: 6.4 10*3/uL (ref 4.0–10.5)
nRBC: 0 % (ref 0.0–0.2)

## 2020-03-03 LAB — MRSA PCR SCREENING: MRSA by PCR: NEGATIVE

## 2020-03-03 MED ORDER — SODIUM CHLORIDE 0.9% FLUSH
10.0000 mL | Freq: Three times a day (TID) | INTRAVENOUS | Status: DC
  Administered 2020-03-03 – 2020-03-04 (×4): 10 mL

## 2020-03-03 MED ORDER — CHLORHEXIDINE GLUCONATE CLOTH 2 % EX PADS
6.0000 | MEDICATED_PAD | Freq: Every day | CUTANEOUS | Status: DC
  Administered 2020-03-03 – 2020-03-06 (×4): 6 via TOPICAL

## 2020-03-03 NOTE — Progress Notes (Signed)
Dr. Benny Lennert notified that patient got very choked up while eating his lunch which was a hamburger.

## 2020-03-03 NOTE — Progress Notes (Signed)
PROGRESS NOTE  Corney Knighton Cremer QVZ:[id number] DOB: 1960/05/05 DOA: 03/02/2020 PCP: Celene Squibb, MD  Brief History   Arlene Genova Guynes is an 60 y.o. 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  He had been hospitalized January 26 for recurrent LLL hydropneumothorax and was treated with pigtail chest tube and then large bore chest tube.  Chest tube was removed but he was readmitted on February 8 with recurrent hydropneumothorax after coughing.  Chest tube was replaced on February 9. He was seen by CT surgery who did not think he was a surgical candidate for any further intervention.  He underwent talc pleurodesis on February 12 secondary to pain.  Chest tube was removed on February 15 and patient was discharged without chest tube on February 17. Hospital course was complicated by diarrhea food impaction in the esophagus which was dilated.  Patient also noted to have some hemoptysis, all anticoagulation has been held and PCCM recommends angiography and embolization by IR should recur.  Patient now presents complaining of recurrent shortness of breath and pain after coughing this morning.  He notes this is exactly what happened the last time.   ED Course:  The patient was seen in the ED and it was noted that his pneumothorax had recurred.  He was discussed with PCCM who recommended pigtail catheter placement.  This was placed in the ED with improvement in symptoms and apparent improvement in his chest x-ray.  [facility] were consulted to admit the patient for further evaluation and treatment. Catheter left in place. Pulmonology consulted.  Consultants  . PCCM . Interventional radiology  Procedures  . Placement of pigtail catheter in left pleural space to resolve pneumothorax.  Antibiotics   Anti-infectives (From admission, onward)   None    .  Subjective  The patient  is resting quietly. He states that he feels much better after catheter placed.  Objective   Vitals:  Vitals:   03/03/20 0700 03/03/20 1100  BP: 125/81 128/81  Pulse: 80 83  Resp: 17 15  Temp: 98.3 F (36.8 C) 98.2 F (36.8 C)  SpO2: 93% 94%    Exam:  Constitutional:  . The patient is awake, alert, and oriented x 3. No acute distress. Respiratory:  . No increased work of breathing. . No wheezes, rales, or rhonchi . No tactile fremitus Cardiovascular:  . Regular rate and rhythm . No murmurs, ectopy, or gallups. . No lateral PMI. No thrills. Abdomen:  . Abdomen is soft, non-tender, non-distended . No hernias, masses, or organomegaly . Normoactive bowel sounds.  Musculoskeletal:  . No cyanosis, clubbing, or edema Skin:  . No rashes, lesions, ulcers . palpation of skin: no induration or nodules Neurologic:  . CN 2-12 intact . Sensation all 4 extremities intact Psychiatric:  . Mental status o Mood, affect appropriate o Orientation to person, place, time  . judgment and insight appear intact  I have personally reviewed the following:   Today's Data  . Vitals, CBC, BMP  Imaging  . CXR  Scheduled Meds: . benzonatate  200 mg Oral TID  . Chlorhexidine Gluconate Cloth  6 each Topical Daily  . escitalopram  10 mg Oral QHS  . gabapentin  300 mg Oral BID  . sodium chloride flush  10 mL Intracatheter Q8H   Continuous Infusions:  Principal Problem:   Left-sided hydropneumothorax--associated with necrotic malignant mass Active Problems:  Esophageal dysphagia   Liposarcoma with mets to the lungs and ribs   Spontaneous pneumothorax   LOS: 1 day   A & P   Recurrent pneumothorax:  Pigtail catheter has been placed and patient is much more comfortable. Pulmonology was consulted. They feel that pleurodesis has not had time to scar down the pleural space and that recurrence was in part due to the patient's fall. They feel that given time, the patient will have  sufficient scarring from pleurodesis to prevent recurrence.  Metastatic liposarcoma: Patient is on palliative chemotherapy with no further treatment options. Palliative care consulted for goals of care discussion.   Esophageal dysphagia: Patient has a history of esophageal stricture requiring dilatation in 2019. GI was consulted when SLP stated that the patient's dysphagia was esophageal in nature during the patient's inpatient stay earlier this month. It was determined that the patient should undergo EGD at some point as outpatient for re-evaluation of his stricture. The patient is complaining of no difficulty with swallowing at present  Hemoptysis: Continue to keep patient off all anticoagulation. PCCM recommended IR if this were to become large volume hemoptysis.  Diarrhea: Patient had diarrhea in the last hospital stay however this does not seem to be a problem at present.  I have seen and examined this patient myself. I have spent 36 minutes in his evaluation and care.  DVT prophylaxis: SCD's CODE STATUS: Full Code Family Communication: None available Disposition:  Status is: Inpatient  Remains inpatient appropriate because:Inpatient level of care appropriate due to severity of illness   Dispo: The patient is from: Home              Anticipated d/c is to: Home              Anticipated d/c date is: 2 days              Patient currently is not medically stable to d/c.   Difficult to place patient No   Earmon Sherrow, DO [facility] Direct contact: see [email or web address]  7PM-7AM contact night coverage as above 03/03/2020, 1:58 PM  LOS: 1 day

## 2020-03-03 NOTE — Progress Notes (Signed)
NAME:  Leonard [name], MRN:  [id number], DOB:  August 31, 1960, LOS: 1 ADMISSION DATE:  03/02/2020, CONSULTATION DATE:  03/03/20 REFERRING MD:  Dr. 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  He had talc pleurodesis on 02/24/20 and had pig tail catheter removed on 02/27/20.  He was doing well at home until he fell on 03/01/20.  Shortly after falling, he developed shortness of breath.  Returned to ER on 03/02/20 and found to have recurrent Lt pneumothorax and had pig tail catheter inserted.  Significant Hospital Events:  1/26 Lt pneumothorax 1/28 pig tail removed >> recurrent PTX and surgical chest tube placed; seen by thoracic surgery 2/02 chest tube removed 2/09 readmitted for Lt pneumothorax and pig tail catheter placed 2/12 talc pleurodesis 2/15 pig tail catheter removed 2/19 readmitted with Lt pneumothorax  Consults:    Procedures:  Lt pig tail catheter 2/19 >>   Significant Diagnostic Tests:    Micro Data:  COVID 2/19 >> negative Flu PCR 2/19 >> negative MRSA PCR 2/19 >> negative  Antimicrobials:    Interim History / Subjective:  Breathing better after chest tube inserted.  Not having chest pain at present.  Vitals signs:  BP 128/81   Pulse 83   Temp 98.2 F (36.8 C) (Oral)   Resp 15   Ht 5\' 10"  (1.778 m)   Wt 90.7 kg   SpO2 94%   BMI 28.70 kg/m   Intake/output:  No intake or output data in the 24 hours ending 03/03/20 1245  Physical exam:  General - alert Eyes - pupils reactive ENT - no sinus tenderness, no stridor Cardiac - regular rate/rhythm, no murmur Chest - equal breath sounds b/l, no wheezing or rales, Lt chest tube in place w/o air leak on -20 cm suction Abdomen - soft, non tender, + bowel sounds Extremities - no cyanosis, clubbing, or edema Skin - no  rashes Neuro - normal strength, moves extremities, follows commands Psych - normal mood and behavior   Discussion:  I suspect recurrence of Lt pneumothorax on 2/18 likely resulted from increased intrathoracic pressure after he fell.  CXR shows resolution of pneumothorax and no air leak at present.  Therefore, I don't think this is a situation at present in which nothing further to offer for the pneumothorax as was questioned.  Hopefully, he will be able to have successful removal of pig tail catheter in next few days as effects of talc pleurodesis done on 02/24/20 continue to progress.  Assessment & Plan:   Recurrent Lt pneumothorax. - continue pig tail catheter to -20 cm suction for now - f/u CXR 2/21 - continue antitussive regimen  Metastatic dedifferentiated liposarcoma. - followed by Dr. Delton Coombes with oncology as outpt   Labs:   CMP Latest Ref Rng & Units 03/03/2020 03/02/2020 02/29/2020  Glucose 70 - 99 mg/dL 79 103(H) 95  BUN 6 - 20 mg/dL 14 13 15   Creatinine 0.61 - 1.24 mg/dL 0.71 0.61 0.75  Sodium 135 - 145 mmol/L 137 138 138  Potassium 3.5 - 5.1 mmol/L 3.7 4.4 4.1  Chloride 98 - 111 mmol/L 105 106 104  CO2 22 - 32 mmol/L 22 26 25   Calcium 8.9 - 10.3 mg/dL 8.7(L) 9.2 8.6(L)  Total Protein 6.5 - 8.1 g/dL - - -  Total Bilirubin 0.3 -  1.2 mg/dL - - -  Alkaline Phos 38 - 126 U/L - - -  AST 15 - 41 U/L - - -  ALT 0 - 44 U/L - - -    CBC Latest Ref Rng & Units 03/03/2020 03/02/2020 02/29/2020  WBC 4.0 - 10.5 K/uL 6.4 7.7 8.3  Hemoglobin 13.0 - 17.0 g/dL 12.7(L) 14.2 11.5(L)  Hematocrit 39.0 - 52.0 % 39.9 44.4 34.1(L)  Platelets 150 - 400 K/uL 248 295 233    Imaging:  DG Chest Portable 1 View  Result Date: 03/02/2020 CLINICAL DATA:  Post chest tube placement. EXAM: PORTABLE CHEST 1 VIEW COMPARISON:  X-ray earlier in the same day FINDINGS: There is been interval placement of a left-sided chest tube with near complete resolution of the left-sided pneumothorax. There is  subcutaneous gas along the patient's left flank. There are small bilateral pleural effusions. There are persistent bilateral airspace opacities, greatest within the right lower lung zone and left upper lobe. The right-sided Port-A-Cath is stable in positioning. The heart size is stable. IMPRESSION: Near complete resolution of the previously seen left-sided pneumothorax status post chest tube placement. Persistent bilateral airspace opacities and small bilateral pleural effusions. Electronically Signed   By: Constance Holster M.D.   On: 03/02/2020 19:08   DG Chest Portable 1 View  Result Date: 03/02/2020 CLINICAL DATA:  60 year old male with a history of shortness of breath EXAM: PORTABLE CHEST 1 VIEW COMPARISON:  02/27/2020 FINDINGS: Cardiomediastinal silhouette unchanged in size and contour. Recurrent left-sided pneumothorax status post left chest tube removal. Opacity within the left lung, particularly at the apex. Reticulonodular opacities of the right lung, unchanged. Right IJ port catheter unchanged. No right-sided pneumothorax or pleural effusion. IMPRESSION: Recurrent left-sided pneumothorax, status post left pigtail thoracostomy tube removal. These results were discussed by telephone at the time of interpretation on 03/02/2020 at 3:24 pm with the emergency room staff. Similar appearance of nodular opacities of the lung in this patient with known metastatic lung carcinoma. Unchanged right IJ port. Electronically Signed   By: Corrie Mckusick D.O.   On: 03/02/2020 15:25    Signature:  Chesley Mires, MD [place] Pager - [phone] 03/03/2020, 12:42 PM

## 2020-03-04 ENCOUNTER — Inpatient Hospital Stay (HOSPITAL_COMMUNITY): Payer: 59

## 2020-03-04 DIAGNOSIS — C499 Malignant neoplasm of connective and soft tissue, unspecified: Secondary | ICD-10-CM

## 2020-03-04 DIAGNOSIS — Z7189 Other specified counseling: Secondary | ICD-10-CM | POA: Diagnosis not present

## 2020-03-04 DIAGNOSIS — Z515 Encounter for palliative care: Secondary | ICD-10-CM

## 2020-03-04 DIAGNOSIS — J948 Other specified pleural conditions: Secondary | ICD-10-CM | POA: Diagnosis not present

## 2020-03-04 DIAGNOSIS — J939 Pneumothorax, unspecified: Secondary | ICD-10-CM | POA: Diagnosis not present

## 2020-03-04 MED ORDER — OXYCODONE HCL 5 MG PO TABS
5.0000 mg | ORAL_TABLET | ORAL | Status: DC | PRN
  Administered 2020-03-04 – 2020-03-05 (×4): 5 mg via ORAL
  Filled 2020-03-04 (×4): qty 1

## 2020-03-04 NOTE — Consult Note (Signed)
Consultation Note Date: 03/04/2020   Patient Name: Leonard [name]  DOB: [birth year]  MRN: [id number]  Age / Sex: 60 y.o., male  PCP: Celene Squibb, MD Referring Physician: Karie Kirks, DO  Reason for Consultation: Establishing goals of care  HPI/Patient Profile: 60 y.o. male  with past medical history of metastatic dedifferentiated liposarcoma (bilateral lung, L posterior 4th rib, pleural space admitted on 03/02/2020 with shortness of breath with recurrent pneumothorax.   Clinical Assessment and Goals of Care: Mr. Kapur is well known to me from previous admission and palliative consultation. Mr. Halling has continued to have multiple issues with recurrent pneumothorax related to his malignancy. He has had multiple chest tubes and talc pleurodesis. Mr. Gearin expresses frustration with repeat admissions and is hoping that he can have either repeat talc or home pigtail catheter if this happens again. He is hopeful that this will be less problematic for him in the near future.   Mr. Kawabata and I discuss his situation. He knows that he has advanced cancer and he knows that he is facing the end of his life. However, this is further complicated by his wife being recently diagnosed with cancer as well. Mr. Petroni wants to pursue any options with oncology that may treat his cancer to give him more time with family. We discussed the reasoning behind not offering treatment when not thought to be effective and beneficial because the side effects can cause worsening quality of life and complications. He continues to ask about any trials or options and plans to follow up with Dr. Delton Coombes. He asks about other options such as surgery and we discussed why surgery is an ineffective treatment in metastatic cancer and we discussed his cancer located inside lung, pleural space, and rib bone. He was unaware of the cancer spreading out of the  lung space and took this information hard but also understands the severity of his condition at the same time.   His main goal is to continue to spend as much time with his family as possible. He does not want to "give up." He also shares that he also does not want to suffer. We discussed a scenario of decreased functional status or increased symptoms leading to suffering and how hospice can be beneficial to provide care to him at this time while also providing support to his family through that process. Mr. Ospina is accepting of this information. His main concern is to stay out of the hospital long enough to sit down with his wife and get some of his affairs in order. He also had a scheduled meeting for tomorrow with outpatient palliative and this will be helpful to have conversation with he and his wife to make some decisions. He has been unable to make any decisions regarding code status as he wishes to discuss with his wife but she is not present at hospital when I come by. Will attempt to meet with patient and wife prior to discharge.   All questions/concerns addressed. Emotional support provided.  Primary Decision Maker PATIENT    SUMMARY OF RECOMMENDATIONS   - Hopeful for improved management of pneumothorax to avoid rehospitalization and spend more time with his family at home.  - Still hopeful for some treatment options - follow up with Dr. Delton Coombes.   Code Status/Advance Care Planning:  Full code   Symptom Management:   No changes to pain or cough regimen.   Palliative Prophylaxis:   Aspiration, Delirium Protocol and Frequent Pain Assessment  Additional Recommendations (Limitations, Scope, Preferences):  Full Scope Treatment  Psycho-social/Spiritual:   Desire for further Chaplaincy support:yes  Additional Recommendations: Caregiving  Support/Resources, Education on Hospice and Grief/Bereavement Support  Prognosis:   Overall prognosis poor with advanced cancer and  likely no further options for treatment.   Discharge Planning: Home with Palliative Services      Primary Diagnoses: Present on Admission: . Left-sided hydropneumothorax--associated with necrotic malignant mass . Liposarcoma with mets to the lungs and ribs . Esophageal dysphagia . Spontaneous pneumothorax   I have reviewed the medical record, interviewed the patient and family, and examined the patient. The following aspects are pertinent.  Past Medical History:  Diagnosis Date  . Cancer [facility])    metastatic lung cancer  . Port-A-Cath in place 05/19/2019   right   Social History   Socioeconomic History  . Marital status: Married    Spouse name: Not on file  . Number of children: 1  . Years of education: Not on file  . Highest education level: Not on file  Occupational History  . Occupation: Unemployed  Tobacco Use  . Smoking status: Former Research scientist (life sciences)  . Smokeless tobacco: Never Used  Vaping Use  . Vaping Use: Never used  Substance and Sexual Activity  . Alcohol use: Yes    Comment: 1-2 drinks per month  . Drug use: Never  . Sexual activity: Not Currently  Other Topics Concern  . Not on file  Social History Narrative   DOES CONSTRUCTION. MARRIED. RARE ETOH. NO TOBACCO.   Social Determinants of Health   Financial Resource Strain: Medium Risk  . Difficulty of Paying Living Expenses: Somewhat hard  Food Insecurity: No Food Insecurity  . Worried About Charity fundraiser in the Last Year: Never true  . Ran Out of Food in the Last Year: Never true  Transportation Needs: No Transportation Needs  . Lack of Transportation (Medical): No  . Lack of Transportation (Non-Medical): No  Physical Activity: Inactive  . Days of Exercise per Week: 0 days  . Minutes of Exercise per Session: 0 min  Stress: Stress Concern Present  . Feeling of Stress : To some extent  Social Connections: Moderately Integrated  . Frequency of Communication with Friends and Family: More than three  times a week  . Frequency of Social Gatherings with Friends and Family: Once a week  . Attends Religious Services: More than 4 times per year  . Active Member of Clubs or Organizations: No  . Attends Archivist Meetings: Never  . Marital Status: Married   Family History  Problem Relation Age of Onset  . Heart disease Father   . Cancer Maternal Aunt   . Cancer Maternal Uncle   . Cancer Paternal Uncle   . Vision loss Maternal Grandfather   . Colon cancer Neg Hx   . Colon polyps Neg Hx    Scheduled Meds: . benzonatate  200 mg Oral TID  . Chlorhexidine Gluconate Cloth  6 each Topical Daily  . escitalopram  10  mg Oral QHS  . gabapentin  300 mg Oral BID  . sodium chloride flush  10 mL Intracatheter Q8H   Continuous Infusions: PRN Meds:.ALPRAZolam, HYDROcodone-homatropine, morphine injection, oxyCODONE, prochlorperazine, zolpidem No Known Allergies Review of Systems  Constitutional: Positive for activity change. Negative for appetite change.  Respiratory: Positive for cough. Negative for shortness of breath.   Neurological: Positive for weakness.    Physical Exam Vitals and nursing note reviewed.  Constitutional:      General: He is not in acute distress.    Appearance: He is ill-appearing.  Cardiovascular:     Rate and Rhythm: Normal rate.  Pulmonary:     Effort: No tachypnea, accessory muscle usage or respiratory distress.  Abdominal:     General: Abdomen is flat.  Neurological:     Mental Status: He is alert and oriented to person, place, and time.     Vital Signs: BP 118/76 (BP Location: Right Arm)   Pulse 82   Temp 98 F (36.7 C) (Oral)   Resp 14   Ht 5\' 10"  (1.778 m)   Wt 90.7 kg   SpO2 99%   BMI 28.70 kg/m  Pain Scale: 0-10   Pain Score: 3    SpO2: SpO2: 99 % O2 Device:SpO2: 99 % O2 Flow Rate: .O2 Flow Rate (L/min): 4 L/min  IO: Intake/output summary:   Intake/Output Summary (Last 24 hours) at 03/04/2020 1343 Last data filed at 03/04/2020  1032 Gross per 24 hour  Intake 730 ml  Output 440 ml  Net 290 ml    LBM: Last BM Date: 03/02/20 Baseline Weight: Weight: 90.7 kg Most recent weight: Weight: 90.7 kg     Palliative Assessment/Data:     Time In: 1340 Time Out: 1500 Time Total: 80 min Greater than 50%  of this time was spent counseling and coordinating care related to the above assessment and plan.  Signed by: Vinie Sill, NP Palliative Medicine Team Pager # [phone] (M-F 8a-5p) Team Phone # [phone] (Nights/Weekends)

## 2020-03-04 NOTE — Plan of Care (Signed)

## 2020-03-04 NOTE — Progress Notes (Signed)
On call notified of chest xray results.

## 2020-03-04 NOTE — Progress Notes (Signed)
NAME:  Leonard [name], MRN:  [id number], DOB:  1960/06/11, LOS: 2 ADMISSION DATE:  03/02/2020, CONSULTATION DATE:  03/04/20 REFERRING MD:  Dr. 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  He had talc pleurodesis on 02/24/20 and had pig tail catheter removed on 02/27/20.  He was doing well at home until he fell on 03/01/20.  Shortly after falling, he developed shortness of breath.  Returned to ER on 03/02/20 and found to have recurrent Lt pneumothorax and had pig tail catheter inserted.  Significant Hospital Events:  1/26 Lt pneumothorax 1/28 pig tail removed >> recurrent PTX and surgical chest tube placed; seen by thoracic surgery 2/02 chest tube removed 2/09 readmitted for Lt pneumothorax and pig tail catheter placed 2/12 talc pleurodesis 2/15 pig tail catheter removed 2/19 readmitted with Lt pneumothorax  Consults:    Procedures:  Lt pig tail catheter 2/19 >>   Significant Diagnostic Tests:    Micro Data:  COVID 2/19 >> negative Flu PCR 2/19 >> negative MRSA PCR 2/19 >> negative  Antimicrobials:    Interim History / Subjective:  Sitting up in bed, with no acute complaints. States dyspnea improved  Vitals signs:  BP (!) 125/91 (BP Location: Right Arm)   Pulse 73   Temp 97.7 F (36.5 C) (Oral)   Resp 10   Ht 5\' 10"  (1.778 m)   Wt 90.7 kg   SpO2 98%   BMI 28.70 kg/m   Intake/output:    Intake/Output Summary (Last 24 hours) at 03/04/2020 0944 Last data filed at 03/04/2020 0500 Gross per 24 hour  Intake 480 ml  Output 365 ml  Net 115 ml    Physical exam:  General: Chronically ill appearing middle aged male, sitting up in bed in no NAD HEENT: [place]/At, MM pink/moist, PERRL,  Neuro: Alert and oriented x3, non-focal,  CV: s1s2 regular rate and rhythm, no murmur, rubs, or  gallops,  PULM:  Clear to ascultation, no added breath sounds, no increase work of breathing  GI: soft, bowel sounds active in all 4 quadrants, non-tender, non-distended Extremities: warm/dry, no edema  Skin: no rashes or lesions  Discussion:  I suspect recurrence of Lt pneumothorax on 2/18 likely resulted from increased intrathoracic pressure after he fell.  CXR shows resolution of pneumothorax and no air leak at present.  Therefore, I don't think this is a situation at present in which nothing further to offer for the pneumothorax as was questioned.  Hopefully, he will be able to have successful removal of pig tail catheter in next few days as effects of talc pleurodesis done on 02/24/20 continue to progress.  Assessment & Plan:   Recurrent Lt pneumothorax -He had talc pleurodesis on 02/24/20 -Left chest tube significantly retracted. Now with side ports partially external to the thoracic cavity per CXR 2/21 P: Given significant retraction of chest tube on CXR, pigtail will be fully removed today   Repeat CXR in AM  Antitussive regiment  Mobilize as able   Metastatic dedifferentiated liposarcoma - followed by Dr. Delton Coombes with oncology as outpt P: Follow up as an outpatient   Labs:   CMP Latest Ref Rng & Units 03/03/2020 03/02/2020 02/29/2020  Glucose 70 - 99 mg/dL 79 103(H) 95  BUN 6 - 20 mg/dL 14 13 15   Creatinine 0.61 - 1.24  mg/dL 0.71 0.61 0.75  Sodium 135 - 145 mmol/L 137 138 138  Potassium 3.5 - 5.1 mmol/L 3.7 4.4 4.1  Chloride 98 - 111 mmol/L 105 106 104  CO2 22 - 32 mmol/L 22 26 25   Calcium 8.9 - 10.3 mg/dL 8.7(L) 9.2 8.6(L)  Total Protein 6.5 - 8.1 g/dL - - -  Total Bilirubin 0.3 - 1.2 mg/dL - - -  Alkaline Phos 38 - 126 U/L - - -  AST 15 - 41 U/L - - -  ALT 0 - 44 U/L - - -    CBC Latest Ref Rng & Units 03/03/2020 03/02/2020 02/29/2020  WBC 4.0 - 10.5 K/uL 6.4 7.7 8.3  Hemoglobin 13.0 - 17.0 g/dL 12.7(L) 14.2 11.5(L)  Hematocrit 39.0 - 52.0 % 39.9 44.4 34.1(L)   Platelets 150 - 400 K/uL 248 295 233    Imaging:  DG Chest Port 1 View  Result Date: 03/04/2020 CLINICAL DATA:  Left pneumothorax EXAM: PORTABLE CHEST 1 VIEW COMPARISON:  Radiograph 03/02/2020, CT 02/06/2020 FINDINGS: Left chest tube is been significantly retracted now with side ports partially external to the thoracic cavity. Some persistent subcutaneous emphysema seen in the left chest wall. Accessed right IJ approach Port-A-Cath tip terminates at superior cavoatrial junction. Telemetry leads overlie the chest. Treat decreasing left pleural effusion. No discernible residual pneumothorax component is seen. Persistent bilateral airspace opacities most pronounced in the right mid to lower lung and left upper lobe. Stable cardiomediastinal contours. IMPRESSION: 1. Left chest tube significantly retracted. Now with side ports partially external to the thoracic cavity. 2. Decrease in the size of a left pleural effusion. No discernible residual pneumothorax component. 3. Persistent bilateral airspace opacities, most pronounced in the right mid to lower lung and left upper lobe. Electronically Signed   By: Lovena Le M.D.   On: 03/04/2020 05:51   DG Chest Portable 1 View  Result Date: 03/02/2020 CLINICAL DATA:  Post chest tube placement. EXAM: PORTABLE CHEST 1 VIEW COMPARISON:  X-ray earlier in the same day FINDINGS: There is been interval placement of a left-sided chest tube with near complete resolution of the left-sided pneumothorax. There is subcutaneous gas along the patient's left flank. There are small bilateral pleural effusions. There are persistent bilateral airspace opacities, greatest within the right lower lung zone and left upper lobe. The right-sided Port-A-Cath is stable in positioning. The heart size is stable. IMPRESSION: Near complete resolution of the previously seen left-sided pneumothorax status post chest tube placement. Persistent bilateral airspace opacities and small bilateral  pleural effusions. Electronically Signed   By: Constance Holster M.D.   On: 03/02/2020 19:08   DG Chest Portable 1 View  Result Date: 03/02/2020 CLINICAL DATA:  60 year old male with a history of shortness of breath EXAM: PORTABLE CHEST 1 VIEW COMPARISON:  02/27/2020 FINDINGS: Cardiomediastinal silhouette unchanged in size and contour. Recurrent left-sided pneumothorax status post left chest tube removal. Opacity within the left lung, particularly at the apex. Reticulonodular opacities of the right lung, unchanged. Right IJ port catheter unchanged. No right-sided pneumothorax or pleural effusion. IMPRESSION: Recurrent left-sided pneumothorax, status post left pigtail thoracostomy tube removal. These results were discussed by telephone at the time of interpretation on 03/02/2020 at 3:24 pm with the emergency room staff. Similar appearance of nodular opacities of the lung in this patient with known metastatic lung carcinoma. Unchanged right IJ port. Electronically Signed   By: Corrie Mckusick D.O.   On: 03/02/2020 15:25    Signature:  Johnsie Cancel, NP-C [employer] Personal contact information can be found on Amion  If no response please page: Adult pulmonary and critical care medicine pager on Amion unitl 7pm After 7pm please call [phone] 03/04/2020, 10:41 AM

## 2020-03-04 NOTE — Consult Note (Incomplete)
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  I have reviewed medical records including EPIC notes, labs and imaging, received report from bedside RN, assessed the patient.    I met with *** to further discuss diagnosis prognosis, GOC, EOL wishes, disposition and options.   I introduced Palliative Medicine as specialized medical care for people living with serious illness. It focuses on providing relief from the symptoms and stress of a serious illness. The goal is to improve quality of life for both the patient and the family.  A detailed discussion was had today regarding advanced directives.  Concepts specific to code status, artifical feeding and hydration, continued IV antibiotics and rehospitalization was had.  The difference between a aggressive medical intervention path  and a palliative comfort care path for this patient at this time was had. Values and goals of care important to patient and family were attempted to be elicited  Discussed the importance of continued conversation with family and their  medical providers regarding overall plan of care and treatment options, ensuring decisions are within the context of the patients values and GOCs.  Provided *** "Hard Choices for Loving People" booklet.   Provided *** "Gone From My Site" booklet.  Decision Maker:  SUMMARY OF RECOMMENDATIONS     Code Status/Advance Care Planning: FULL CODE  DNAR/DNI  Modified CODE   Symptom Management:     Palliative Prophylaxis:     Additional Recommendations (Limitations, Scope, Preferences):      Psycho-social/Spiritual:    Desire for further Chaplaincy support:   Additional Recommendations:    Prognosis:   Discharge Planning:   ROS  Oral Intake %:   I/O:   Bowel Movements:   Mobility:   Vitals:   03/03/20 2300 03/04/20 0315  BP: 129/81   Pulse: 76   Resp: 12   Temp:  97.7 F (36.5 C)  SpO2: 95%     Intake/Output Summary (Last 24 hours) at 03/04/2020 0647 Last data filed at 03/04/2020 0500 Gross per 24 hour  Intake 600 ml  Output 465 ml  Net 135 ml   Last Weight  Most recent update: 03/02/2020 11:43 PM   Weight  90.7 kg (200 lb)            Gen:  NAD HEENT: moist mucous membranes CV: Regular rate and rhythm, no murmurs rubs or gallops PULM: clear to auscultation bilaterally. No wheezes/rales/rhonchi*** ABD: soft/nontender/nondistended/normal bowel sounds*** EXT: No edema*** Neuro: Alert and oriented x3***  PPS:   This conversation/these recommendations were discussed with patient primary care team, Dr. Marland Kitchen  Time In: Time Out: Total Time: *** Greater than 50%  of this time was spent counseling and coordinating care related to the above assessment and plan.  [facility] Team Team Cell Phone: [phone] Please utilize secure chat with additional questions, if there is no response within 30 minutes please call the above phone number  Palliative Medicine Team providers are available by phone from 7am to 7pm daily and can be reached through the team cell phone.  Should this patient require assistance outside of these hours, please call the patient's attending physician.

## 2020-03-04 NOTE — Progress Notes (Addendum)
PROGRESS NOTE  Kayven Aldaco Wahab MGQ:[id number] DOB: Aug 21, 1960 DOA: 03/02/2020 PCP: Celene Squibb, MD  Brief History   Christia Coaxum Nifong is an 60 y.o. 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  He had been hospitalized January 26 for recurrent LLL hydropneumothorax and was treated with pigtail chest tube and then large bore chest tube.  Chest tube was removed but he was readmitted on February 8 with recurrent hydropneumothorax after coughing.  Chest tube was replaced on February 9. He was seen by CT surgery who did not think he was a surgical candidate for any further intervention.  He underwent talc pleurodesis on February 12 secondary to pain.  Chest tube was removed on February 15 and patient was discharged without chest tube on February 17. Hospital course was complicated by diarrhea food impaction in the esophagus which was dilated.  Patient also noted to have some hemoptysis, all anticoagulation has been held and PCCM recommends angiography and embolization by IR should recur.  Patient now presents complaining of recurrent shortness of breath and pain after coughing this morning.  He notes this is exactly what happened the last time.   ED Course:  The patient was seen in the ED and it was noted that his pneumothorax had recurred.  He was discussed with PCCM who recommended pigtail catheter placement.  This was placed in the ED with improvement in symptoms and apparent improvement in his chest x-ray.  [facility] were consulted to admit the patient for further evaluation and treatment. Catheter left in place. Pulmonology consulted.  CXR this am demonstrates partial retraction of catheter. As pneumothorax remains resolved, PCCM has decided to remove the catheter and continue to monitor the left lung for recurrence of pneumothorax and effusion.  CXR will be repeated in the  morning. If it is negative for recurrence of pneumothorax, the patient may be discharged to home.  Consultants  . PCCM . Interventional radiology  Procedures  . Placement of pigtail catheter in left pleural space to resolve pneumothorax. . Removal of catheter.  Antibiotics   Anti-infectives (From admission, onward)   None     Subjective  The patient is resting quietly. He is complaining that hydrocodone is not sufficient to address his pain. He has requested oxycodone instead.  Objective   Vitals:  Vitals:   03/04/20 0723 03/04/20 1132  BP: (!) 125/91 118/76  Pulse: 73 82  Resp: 10 14  Temp: 97.7 F (36.5 C) 98 F (36.7 C)  SpO2: 98% 99%    Exam:  Constitutional:  . The patient is awake, alert, and oriented x 3. No acute distress. Respiratory:  . No increased work of breathing. . No wheezes, rales, or rhonchi . No tactile fremitus Cardiovascular:  . Regular rate and rhythm . No murmurs, ectopy, or gallups. . No lateral PMI. No thrills. Abdomen:  . Abdomen is soft, non-tender, non-distended . No hernias, masses, or organomegaly . Normoactive bowel sounds.  Musculoskeletal:  . No cyanosis, clubbing, or edema Skin:  . No rashes, lesions, ulcers . palpation of skin: no induration or nodules Neurologic:  . CN 2-12 intact . Sensation all 4 extremities intact Psychiatric:  . Mental status o Mood, affect appropriate o Orientation to person, place, time  . judgment and insight appear intact  I have personally reviewed the following:   Today's Data  . Vitals  Imaging  . CXR  Scheduled Meds: . benzonatate  200 mg Oral TID  . Chlorhexidine Gluconate Cloth  6 each Topical Daily  . escitalopram  10 mg Oral QHS  . gabapentin  300 mg Oral BID  . 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 Pulmonology was consulted. They feel that pleurodesis has not had time to scar down the pleural space and that recurrence was in part due to the patient's fall. They feel that given time, the patient will have sufficient scarring from pleurodesis to prevent recurrence. CXR this am demonstrates partial retraction of catheter. As pneumothorax remains resolved, PCCM has decided to remove the catheter and continue to monitor the left lung for recurrence of pneumothorax and effusion.  Metastatic liposarcoma: Patient is on palliative chemotherapy with no further treatment options. Palliative care consulted for goals of care discussion.   Esophageal dysphagia: Patient has a history of esophageal stricture requiring dilatation in 2019. GI was consulted when SLP stated that the patient's dysphagia was esophageal in nature during the patient's inpatient stay earlier this month. It was determined that the patient should undergo EGD at some point as outpatient for re-evaluation of his stricture. The patient is complaining of no difficulty with swallowing at present.  Hemoptysis: Continue to keep patient off all anticoagulation. PCCM recommended IR if this were to become large volume hemoptysis. No complaints of hemoptysis at present.  Diarrhea: Patient had diarrhea in the last hospital stay however this does not seem to be a problem at present.  I have seen and examined this patient myself. I have spent 32 minutes in his evaluation and care.  DVT prophylaxis: SCD's CODE STATUS: Full Code Family Communication: None available Disposition:  Status is: Inpatient  Remains inpatient appropriate because:Inpatient level of care appropriate due to severity of illness  Dispo: The patient is from: Home               Anticipated d/c is to: Home              Anticipated d/c date is: 1 day              Patient currently is not medically stable to d/c.   Difficult to place patient No   Kevion Fatheree, DO [facility] Direct contact: see [email or web address]  7PM-7AM contact night coverage as above 03/04/2020, 2:11 PM  LOS: 1 day

## 2020-03-05 ENCOUNTER — Inpatient Hospital Stay (HOSPITAL_COMMUNITY): Payer: 59

## 2020-03-05 DIAGNOSIS — J9383 Other pneumothorax: Principal | ICD-10-CM

## 2020-03-05 DIAGNOSIS — R1319 Other dysphagia: Secondary | ICD-10-CM

## 2020-03-05 LAB — BASIC METABOLIC PANEL
Anion gap: 7 (ref 5–15)
BUN: 12 mg/dL (ref 6–20)
CO2: 25 mmol/L (ref 22–32)
Calcium: 8.3 mg/dL — ABNORMAL LOW (ref 8.9–10.3)
Chloride: 104 mmol/L (ref 98–111)
Creatinine, Ser: 0.57 mg/dL — ABNORMAL LOW (ref 0.61–1.24)
GFR, Estimated: >60 mL/min (ref >60)
Glucose, Bld: 105 mg/dL — ABNORMAL HIGH (ref 70–99)
Potassium: 3.6 mmol/L (ref 3.5–5.1)
Sodium: 136 mmol/L (ref 135–145)

## 2020-03-05 LAB — CBC WITH DIFFERENTIAL/PLATELET
Abs Immature Granulocytes: 0.03 10*3/uL (ref 0.00–0.07)
Basophils Absolute: 0 10*3/uL (ref 0.0–0.1)
Basophils Relative: 0 %
Eosinophils Absolute: 0.7 10*3/uL — ABNORMAL HIGH (ref 0.0–0.5)
Eosinophils Relative: 13 %
HCT: 36.6 % — ABNORMAL LOW (ref 39.0–52.0)
Hemoglobin: 11.7 g/dL — ABNORMAL LOW (ref 13.0–17.0)
Immature Granulocytes: 1 %
Lymphocytes Relative: 10 %
Lymphs Abs: 0.5 10*3/uL — ABNORMAL LOW (ref 0.7–4.0)
MCH: 31.4 pg (ref 26.0–34.0)
MCHC: 32 g/dL (ref 30.0–36.0)
MCV: 98.1 fL (ref 80.0–100.0)
Monocytes Absolute: 0.7 10*3/uL (ref 0.1–1.0)
Monocytes Relative: 13 %
Neutro Abs: 3.3 10*3/uL (ref 1.7–7.7)
Neutrophils Relative %: 63 %
Platelets: 217 10*3/uL (ref 150–400)
RBC: 3.73 MIL/uL — ABNORMAL LOW (ref 4.22–5.81)
RDW: 14.9 % (ref 11.5–15.5)
WBC: 5.3 10*3/uL (ref 4.0–10.5)
nRBC: 0 % (ref 0.0–0.2)

## 2020-03-05 MED ORDER — HYPROMELLOSE (GONIOSCOPIC) 2.5 % OP SOLN
1.0000 [drp] | Freq: Four times a day (QID) | OPHTHALMIC | Status: DC | PRN
  Administered 2020-03-05: 1 [drp] via OPHTHALMIC
  Filled 2020-03-05: qty 15

## 2020-03-05 NOTE — Consult Note (Signed)
   [facility] Inpatient Consult   03/05/2020  Dossie Swor Winborne 03-Sep-1960 [id number]  [facility] [ACO] Patient: [facility] [employer]/Managed Medicaid  High risk score for unplanned readmission in less than 7 days.  Patient was recently referred to [facility] team for follow up. Electronic Medical record for review of readmission and care needs. . Plan: Will continue to follow progress notes and consults for post hospital needs/support.  Natividad Brood, RN BSN [facility] Liaison  [phone] business mobile phone Toll free office [phone]  Fax number: [phone] [email or web address]

## 2020-03-05 NOTE — Progress Notes (Addendum)
Patient ID: Leonard [name], male   DOB: [birth year], 60 y.o.   MRN: [id number]  PROGRESS NOTE    Naphtali Zywicki Carelock  IEP:[id number] DOB: 11/01/1960 DOA: 03/02/2020 PCP: Celene Squibb, MD   Brief Narrative:  60 year old male with history of liposarcoma of the left leg with metastasis to the lungs, recurrent pneumothorax with recent hospitalization and discharge for recurrent pneumothorax requiring chest tube placement and subsequent talc pleurodesis on 02/24/2020 and removal of chest tube on 02/27/2020 and subsequent discharge on 02/29/2020 presented on 03/02/2020 with worsening shortness of breath and pain after coughing.  He was found to have pneumothorax again.  Pigtail catheter was placed by ER.  PCCM was consulted.  Pigtail catheter was subsequently removed on 03/04/2020.  Assessment & Plan:   Recurrent left pneumothorax -Had recent hospitalization and discharge requiring chest tube placement, talc pleurodesis and subsequent chest tube removal -Presented again with worsening shortness of breath and evidence of pneumothorax.  Pigtail catheter was placed by ER.  -PCCM was consulted.  Pigtail catheter was subsequently removed on 03/04/2020. -Chest x-ray on 03/04/2020 showed no pneumothorax.   -Chest x-ray on 03/05/2020 showed small left apical pneumothorax slightly present -Currently on room air.  Patient very hesitant and anxious about being discharged today and wants to wait for 1 more day. -Repeat a.m. chest x-ray  Metastatic liposarcoma -Outpatient follow-up with oncology.  Palliative care evaluation appreciated.  Currently remains full code.  Overall prognosis is very poor  Esophageal dysphagia -GI evaluated the patient during recent hospitalization and patient will need outpatient GI follow-up and possibly EGD because of history of esophageal stricture requiring dilation in the past  Hemoptysis -Continue to keep patient off all anticoagulation. -PCCM recommended IR evaluation if this were to become  large volume hemoptysis. No complaints of hemoptysis at present.  DVT prophylaxis: SCDs Code Status: Full Family Communication: None Disposition Plan: Status is: Inpatient  Remains inpatient appropriate because:Inpatient level of care appropriate due to severity of illness   Dispo: The patient is from: Home              Anticipated d/c is to: Home              Anticipated d/c date is: 1 day              Patient currently is not medically stable to d/c.   Difficult to place patient No   Consultants: Pulmonary  Procedures: Pigtail catheter placement and subsequent removal  Antimicrobials:  Anti-infectives (From admission, onward)   None       Subjective: Patient seen and examined at bedside.  Denies worsening shortness of breath or chest pain.  Feels very anxious and does not feel that he should be discharged today as he is worried that his pneumothorax might come back and wants to wait 1 more day.  Objective: Vitals:   03/04/20 2315 03/04/20 2321 03/05/20 0402 03/05/20 0744  BP: 121/81  123/74 110/85  Pulse: 84   79  Resp: 14  18 13   Temp:  98.7 F (37.1 C) 98.4 F (36.9 C) 98.7 F (37.1 C)  TempSrc:   Oral Oral  SpO2: 95%   94%  Weight:      Height:        Intake/Output Summary (Last 24 hours) at 03/05/2020 1031 Last data filed at 03/05/2020 0900 Gross per 24 hour  Intake 630 ml  Output 275 ml  Net 355 ml   Filed Weights   03/02/20 2300  Weight:  90.7 kg    Examination:  General exam: Appears calm and comfortable.  Currently on room air Respiratory system: Bilateral decreased breath sounds at bases with some scattered crackles Cardiovascular system: S1 & S2 heard, Rate controlled Gastrointestinal system: Abdomen is nondistended, soft and nontender. Normal bowel sounds heard. Extremities: No cyanosis, clubbing; lower extremity edema present   Data Reviewed: I have personally reviewed following labs and imaging studies  CBC: Recent Labs  Lab  02/28/20 0330 02/29/20 0330 03/02/20 1543 03/03/20 0426 03/05/20 0432  WBC 8.4 8.3 7.7 6.4 5.3  NEUTROABS  --   --   --   --  3.3  HGB 10.7* 11.5* 14.2 12.7* 11.7*  HCT 32.2* 34.1* 44.4 39.9 36.6*  MCV 96.7 96.9 99.1 98.3 98.1  PLT 242 233 295 248 106   Basic Metabolic Panel: Recent Labs  Lab 02/28/20 0330 02/29/20 0330 03/02/20 1543 03/03/20 0426 03/05/20 0432  NA 139 138 138 137 136  K 3.8 4.1 4.4 3.7 3.6  CL 105 104 106 105 104  CO2 26 25 26 22 25   GLUCOSE 92 95 103* 79 105*  BUN 15 15 13 14 12   CREATININE 0.60* 0.75 0.61 0.71 0.57*  CALCIUM 8.7* 8.6* 9.2 8.7* 8.3*   GFR: Estimated Creatinine Clearance: 111.3 mL/min (A) (by C-G formula based on SCr of 0.57 mg/dL (L)). Liver Function Tests: No results for input(s): AST, ALT, ALKPHOS, BILITOT, PROT, ALBUMIN in the last 168 hours. No results for input(s): LIPASE, AMYLASE in the last 168 hours. No results for input(s): AMMONIA in the last 168 hours. Coagulation Profile: No results for input(s): INR, PROTIME in the last 168 hours. Cardiac Enzymes: No results for input(s): CKTOTAL, CKMB, CKMBINDEX, TROPONINI in the last 168 hours. BNP (last 3 results) No results for input(s): PROBNP in the last 8760 hours. HbA1C: No results for input(s): HGBA1C in the last 72 hours. CBG: No results for input(s): GLUCAP in the last 168 hours. Lipid Profile: No results for input(s): CHOL, HDL, LDLCALC, TRIG, CHOLHDL, LDLDIRECT in the last 72 hours. Thyroid Function Tests: No results for input(s): TSH, T4TOTAL, FREET4, T3FREE, THYROIDAB in the last 72 hours. Anemia Panel: No results for input(s): VITAMINB12, FOLATE, FERRITIN, TIBC, IRON, RETICCTPCT in the last 72 hours. Sepsis Labs: No results for input(s): PROCALCITON, LATICACIDVEN in the last 168 hours.  Recent Results (from the past 240 hour(s))  Resp Panel by RT-PCR (Flu A&B, Covid) Nasopharyngeal Swab     Status: None   Collection Time: 03/02/20  6:47 PM   Specimen:  Nasopharyngeal Swab; Nasopharyngeal(NP) swabs in vial transport medium  Result Value Ref Range Status   SARS Coronavirus 2 by RT PCR NEGATIVE NEGATIVE Final    Comment: (NOTE) SARS-CoV-2 target nucleic acids are NOT DETECTED.  The SARS-CoV-2 RNA is generally detectable in upper respiratory specimens during the acute phase of infection. The lowest concentration of SARS-CoV-2 viral copies this assay can detect is 138 copies/mL. A negative result does not preclude SARS-Cov-2 infection and should not be used as the sole basis for treatment or other patient management decisions. A negative result may occur with  improper specimen collection/handling, submission of specimen other than nasopharyngeal swab, presence of viral mutation(s) within the areas targeted by this assay, and inadequate number of viral copies(<138 copies/mL). A negative result must be combined with clinical observations, patient history, and epidemiological information. The expected result is Negative.  Fact Sheet for Patients:  EntrepreneurPulse.com.au  Fact Sheet for Healthcare Providers:  IncredibleEmployment.be  This test is  no t yet approved or cleared by the [place] and  has been authorized for detection and/or diagnosis of SARS-CoV-2 by FDA under an Emergency Use Authorization (EUA). This EUA will remain  in effect (meaning this test can be used) for the duration of the COVID-19 declaration under Section 564(b)(1) of the Act, 21 U.S.C.section 360bbb-3(b)(1), unless the authorization is terminated  or revoked sooner.       Influenza A by PCR NEGATIVE NEGATIVE Final   Influenza B by PCR NEGATIVE NEGATIVE Final    Comment: (NOTE) The Xpert Xpress SARS-CoV-2/FLU/RSV plus assay is intended as an aid in the diagnosis of influenza from Nasopharyngeal swab specimens and should not be used as a sole basis for treatment. Nasal washings and aspirates are unacceptable for  Xpert Xpress SARS-CoV-2/FLU/RSV testing.  Fact Sheet for Patients: EntrepreneurPulse.com.au  Fact Sheet for Healthcare Providers: IncredibleEmployment.be  This test is not yet approved or cleared by the [place] FDA and has been authorized for detection and/or diagnosis of SARS-CoV-2 by FDA under an Emergency Use Authorization (EUA). This EUA will remain in effect (meaning this test can be used) for the duration of the COVID-19 declaration under Section 564(b)(1) of the Act, 21 U.S.C. section 360bbb-3(b)(1), unless the authorization is terminated or revoked.  Performed at [facility], [address], [place], [place] [address]   MRSA PCR Screening     Status: None   Collection Time: 03/02/20 10:14 PM   Specimen: Nasal Mucosa; Nasopharyngeal  Result Value Ref Range Status   MRSA by PCR NEGATIVE NEGATIVE Final    Comment:        The GeneXpert MRSA Assay (FDA approved for NASAL specimens only), is one component of a comprehensive MRSA colonization surveillance program. It is not intended to diagnose MRSA infection nor to guide or monitor treatment for MRSA infections. Performed at [facility], [place] [address], [place], [place] [address]          Radiology Studies: DG CHEST PORT 1 VIEW  Result Date: 03/05/2020 CLINICAL DATA:  Left pneumothorax. EXAM: PORTABLE CHEST 1 VIEW COMPARISON:  March 04, 2020. FINDINGS: Stable cardiomediastinal silhouette. Right internal jugular Port-A-Cath is unchanged in position. Small left apical pneumothorax may be present. Stable bilateral lung opacities are noted concerning for metastatic disease. Bony thorax is unremarkable. IMPRESSION: Small left apical pneumothorax may be present. Stable bilateral lung opacities concerning for metastatic disease. Electronically Signed   By: Marijo Conception M.D.   On: 03/05/2020 09:48   DG CHEST PORT 1 VIEW  Result Date: 03/04/2020 CLINICAL DATA:  Chest tube  removal. EXAM: PORTABLE CHEST 1 VIEW COMPARISON:  Same day. FINDINGS: Stable cardiomediastinal silhouette. Left-sided chest tube has been removed. No definite pneumothorax is noted right internal jugular Port-A-Cath is unchanged. Stable bilateral rounded opacities are noted concerning for metastatic disease. Bony thorax is unremarkable. IMPRESSION: No pneumothorax status post left-sided chest tube removal. Stable bilateral rounded opacities concerning for metastatic disease. Electronically Signed   By: Marijo Conception M.D.   On: 03/04/2020 16:34   DG Chest Port 1 View  Result Date: 03/04/2020 CLINICAL DATA:  Left pneumothorax EXAM: PORTABLE CHEST 1 VIEW COMPARISON:  Radiograph 03/02/2020, CT 02/06/2020 FINDINGS: Left chest tube is been significantly retracted now with side ports partially external to the thoracic cavity. Some persistent subcutaneous emphysema seen in the left chest wall. Accessed right IJ approach Port-A-Cath tip terminates at superior cavoatrial junction. Telemetry leads overlie the chest. Treat decreasing left pleural effusion. No  discernible residual pneumothorax component is seen. Persistent bilateral airspace opacities most pronounced in the right mid to lower lung and left upper lobe. Stable cardiomediastinal contours. IMPRESSION: 1. Left chest tube significantly retracted. Now with side ports partially external to the thoracic cavity. 2. Decrease in the size of a left pleural effusion. No discernible residual pneumothorax component. 3. Persistent bilateral airspace opacities, most pronounced in the right mid to lower lung and left upper lobe. Electronically Signed   By: Lovena Le M.D.   On: 03/04/2020 05:51        Scheduled Meds: . benzonatate  200 mg Oral TID  . Chlorhexidine Gluconate Cloth  6 each Topical Daily  . escitalopram  10 mg Oral QHS  . gabapentin  300 mg Oral BID  . sodium chloride flush  10 mL Intracatheter Q8H   Continuous Infusions:        Aline August, MD [facility] 03/05/2020, 10:31 AM

## 2020-03-06 ENCOUNTER — Other Ambulatory Visit: Payer: Self-pay | Admitting: *Deleted

## 2020-03-06 ENCOUNTER — Inpatient Hospital Stay (HOSPITAL_COMMUNITY): Payer: 59

## 2020-03-06 ENCOUNTER — Encounter (HOSPITAL_COMMUNITY): Payer: Self-pay

## 2020-03-06 DIAGNOSIS — R1319 Other dysphagia: Secondary | ICD-10-CM | POA: Diagnosis not present

## 2020-03-06 DIAGNOSIS — C499 Malignant neoplasm of connective and soft tissue, unspecified: Secondary | ICD-10-CM | POA: Diagnosis not present

## 2020-03-06 DIAGNOSIS — J9383 Other pneumothorax: Secondary | ICD-10-CM | POA: Diagnosis not present

## 2020-03-06 MED ORDER — HEPARIN SOD (PORK) LOCK FLUSH 100 UNIT/ML IV SOLN
500.0000 [IU] | INTRAVENOUS | Status: DC | PRN
  Filled 2020-03-06: qty 5

## 2020-03-06 MED ORDER — HEPARIN SOD (PORK) LOCK FLUSH 100 UNIT/ML IV SOLN
500.0000 [IU] | INTRAVENOUS | Status: DC
  Administered 2020-03-06: 500 [IU]

## 2020-03-06 MED ORDER — POLYETHYLENE GLYCOL 3350 17 G PO PACK: 17.0000 g | PACK | Freq: Every day | ORAL | 0 refills | Status: AC | PRN

## 2020-03-06 MED ORDER — ESCITALOPRAM OXALATE 10 MG PO TABS: 10.0000 mg | ORAL_TABLET | Freq: Every day | ORAL | Status: AC

## 2020-03-06 MED ORDER — GABAPENTIN 300 MG PO CAPS: 300.0000 mg | ORAL_CAPSULE | Freq: Two times a day (BID) | ORAL | Status: AC

## 2020-03-06 NOTE — Discharge Summary (Signed)
Physician Discharge Summary  Leonard [name] TIW:[id number] DOB: Aug 12, 1960 DOA: 03/02/2020  PCP: Celene Squibb, MD  Admit date: 03/02/2020 Discharge date: 03/06/2020  Admitted From: Home Disposition: Home  Recommendations for Outpatient Follow-up:  1. Follow up with PCP in 1 week  2. Outpatient follow-up with pulmonary 3. Outpatient follow-up with oncology 4. 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  He was found to have pneumothorax again.  Pigtail catheter was placed by ER.  PCCM was consulted.  Pigtail catheter was subsequently removed on 03/04/2020.  His respiratory status has remained stable subsequently with chest x-ray showing probable tiny left apical pneumothorax.  Pulmonary has cleared the patient for discharge.  He will be discharged home today.  Discharge Diagnoses:   Recurrent left pneumothorax -Had recent hospitalization and discharge requiring chest tube placement, talc pleurodesis and subsequent chest tube removal -Presented again with worsening shortness of breath and evidence of pneumothorax.  Pigtail catheter was placed by ER.  -PCCM was consulted.  Pigtail catheter was subsequently removed on 03/04/2020. -Chest x-ray on 03/04/2020 showed no pneumothorax.   -Chest x-ray on 03/05/2020 showed small left apical pneumothorax slightly present -Repeat chest x-ray today shows probable tiny left apical  pneumothorax.  Respiratory status has remained stable with no increased tachypnea and currently on room air.  I have discussed his x-ray findings with Dr. Campbell Riches on 03/05/2020 and 03/06/2020 and he has cleared the patient for discharge. -Patient feels okay to be discharged home today.  He will be discharged home today with outpatient follow-up with PCP/pulmonary.  Metastatic liposarcoma -Outpatient follow-up with oncology.  Palliative care evaluation appreciated.  Currently remains full code.  Overall prognosis is very poor.  Recommend outpatient follow-up with palliative care as well  Esophageal dysphagia -GI evaluated the patient during recent hospitalization and patient will need outpatient GI follow-up and possibly EGD because of history of esophageal stricture requiring dilation in the past  Hemoptysis -Continue to keep patient off all anticoagulation. -PCCM recommended IR evaluation if this were to become large volume hemoptysis.No complaints of hemoptysis at present.  Discharge Instructions  Discharge Instructions    Ambulatory referral to Pulmonology   Complete by: As directed    Hospital follow-up for recurrent pneumothorax   Reason for referral: Other   Diet general   Complete by: As directed    Increase activity slowly   Complete by: As directed    No wound care   Complete by: As directed      Allergies as of 03/06/2020   No Known Allergies     Medication List    STOP taking these medications   aluminum-magnesium hydroxide-simethicone 200-200-20 MG/5ML Susp Commonly known as: MAALOX   docusate sodium 100 MG capsule Commonly known as: COLACE     TAKE these medications   ALPRAZolam 0.25 MG tablet Commonly known as: XANAX Take 1 tablet (0.25 mg total) by mouth 2 (two) times daily as needed for anxiety. What changed:   when to take this  additional instructions  benzonatate 200 MG capsule Commonly known as: TESSALON Take 1 capsule (200 mg  total) by mouth 3 (three) times daily.   escitalopram 10 MG tablet Commonly known as: LEXAPRO Take 1 tablet (10 mg total) by mouth at bedtime.   gabapentin 300 MG capsule Commonly known as: NEURONTIN Take 1 capsule (300 mg total) by mouth in the morning and at bedtime.   HYDROcodone-homatropine 5-1.5 MG/5ML syrup Commonly known as: HYCODAN Take 10 mLs by mouth every 6 (six) hours as needed for cough. SMARTSIG:1 Teaspoon By Mouth Every 6 Hours PRN What changed: additional instructions   Oxycodone HCl 10 MG Tabs Take 0.5 tablets (5 mg total) by mouth every 8 (eight) hours as needed. What changed: reasons to take this   pantoprazole 40 MG tablet Commonly known as: PROTONIX Take 1 tablet (40 mg total) by mouth 2 (two) times daily before a meal.   polyethylene glycol 17 g packet Commonly known as: MIRALAX / GLYCOLAX Take 17 g by mouth daily as needed for mild constipation. What changed:   when to take this  reasons to take this   prochlorperazine 10 MG tablet Commonly known as: COMPAZINE TAKE (1) TABLET BY MOUTH EVERY (6) HOURS AS NEEDED. What changed:   how much to take  how to take this  when to take this  reasons to take this  additional instructions   zolpidem 10 MG tablet Commonly known as: AMBIEN Take 1 tablet (10 mg total) by mouth at bedtime as needed for sleep. What changed: when to take this            Durable Medical Equipment  (From admission, onward)         Start     Ordered   03/05/20 1524  For home use only DME Walker rolling  Once       Question Answer Comment  Walker: With 5 Inch Wheels   Patient needs a walker to treat with the following condition Weakness      03/05/20 1523          Follow-up Information    Celene Squibb, MD. Schedule an appointment as soon as possible for a visit in 1 week(s).   Specialty: Internal Medicine Contact information: [address] [place]t [facility] [address] [phone]        [name], MD. Schedule an appointment as soon as possible for a visit in 1 week(s).   Specialty: Hematology Contact information: [place] [address] [phone]              No Known Allergies  Consultations:  Pulmonary/palliative care   Procedures/Studies: DG Chest 2 View  Result Date: 02/20/2020 CLINICAL DATA:  Metastatic sarcoma.  Follow-up pneumothorax EXAM: CHEST - 2 VIEW COMPARISON:  02/15/2020 FINDINGS: Progression of left pneumothorax now approximately 30-40%. Small amount of pleural fluid in the left lung base. Gas in the chest wall on the left is unchanged. Multiple bilateral lung nodules most compatible with metastatic disease, unchanged. Port-A-Cath tip SVC unchanged. Small right pleural effusion. IMPRESSION: Interval progression of left pneumothorax approximately 30-40%. These results were called by telephone at the time of interpretation on 02/20/2020 at 3:53 pm to provider [facility] , who verbally acknowledged these results. Electronically Signed   By: Franchot Gallo M.D.   On: 02/20/2020 15:53   DG Chest 2 View  Result Date: 02/15/2020 CLINICAL DATA:  Pneumothorax, recent chest tube removal, history of lung cancer EXAM: CHEST - 2 VIEW  COMPARISON:  Radiograph 02/14/2020, CT 02/06/2020 FINDINGS: Accessed right IJ approach Port-A-Cath tip terminates in the lower SVC. Telemetry leads and support devices overlie the chest. Cholecystectomy clips in the right upper quadrant. Persistent subcutaneous emphysema extending across the left chest wall. Suspect small residual left apical visceral pleural line with additional increasing lucency along the left heart and mediastinal borders which could reflect a pneumomediastinum or less likely medial pneumothorax. Small bilateral effusions. Persistent heterogeneous opacities are again seen in the mid to lower lungs with additional bilateral pulmonary masses, largest in the left lung apex. No acute or worrisome osseous  abnormality. IMPRESSION: 1. Suspect small residual left apical visceral pleural line/apical pneumothorax. Additional increasing lucency along the left heart and mediastinal borders which could reflect a pneumomediastinum or less likely medial pneumothorax component. Small bilateral effusions. 2. Extensive subcutaneous emphysema remains of the left chest wall. 3. Persistent heterogeneous opacities in the mid to lower lungs, favor edema and atelectasis. 4. Redemonstrated pulmonary masses, largest in the left apex. Electronically Signed   By: Lovena Le M.D.   On: 02/15/2020 06:46   DG Chest 2 View  Result Date: 02/14/2020 CLINICAL DATA:  Pneumothorax EXAM: CHEST - 2 VIEW COMPARISON:  Chest x-ray 02/13/2020 FINDINGS: Right chest wall Port-A-Cath and left chest tube in stable appropriate position. The heart size and mediastinal contours are within normal limits. Persistent small apical left pneumothorax. Persistent bilateral pulmonary masses again noted. Possible trace right pleural effusion. No definite left pleural effusion. No acute osseous abnormality. IMPRESSION: 1. Grossly stable small left apical pneumothorax. 2. Trace right pleural effusion. 3. Persistent bilateral pulmonary masses. 4. Lines and tubes in stable position. Electronically Signed   By: Iven Finn M.D.   On: 02/14/2020 06:54   DG Chest 2 View  Result Date: 02/13/2020 CLINICAL DATA:  Chest tube. EXAM: CHEST - 2 VIEW COMPARISON:  02/12/2020. FINDINGS: Left chest tube in stable position. Left pneumothorax improved from prior exam with tiny residual left apical pneumothorax. Persistent bilateral pulmonary masses again noted. Small right pleural effusion. Heart size stable. Degenerative change thoracic spine. IMPRESSION: 1. Left chest tube in stable position. Left pneumothorax improved from prior exam with tiny residual left apical pneumothorax. 2. Persistent bilateral pulmonary masses again noted. Small right pleural effusion. Electronically  Signed   By: Marcello Moores  Register   On: 02/13/2020 07:00   CT CHEST ABDOMEN PELVIS W CONTRAST  Result Date: 02/06/2020 CLINICAL DATA:  60 year old male with history of liposarcoma in the left lower extremity. EXAM: CT CHEST, ABDOMEN, AND PELVIS WITH CONTRAST TECHNIQUE: Multidetector CT imaging of the chest, abdomen and pelvis was performed following the standard protocol during bolus administration of intravenous contrast. CONTRAST:  146mL OMNIPAQUE IOHEXOL 300 MG/ML  SOLN COMPARISON:  CT the chest, abdomen and pelvis 11/01/2019. FINDINGS: CT CHEST FINDINGS Cardiovascular: Heart size is normal. Trace amount of pericardial fluid and/or thickening, unlikely to be of hemodynamic significance at this time. No associated pericardial calcification. There is aortic atherosclerosis, as well as atherosclerosis of the great vessels of the mediastinum and the coronary arteries, including calcified atherosclerotic plaque in the left main, left anterior descending and right coronary arteries.Right internal jugular single-lumen porta cath with tip terminating at the superior cavoatrial junction. Mediastinum/Nodes: No pathologically enlarged mediastinal or hilar lymph nodes. Esophagus is unremarkable in appearance. No axillary lymphadenopathy. Lungs/Pleura: Multiple large pulmonary nodules and masses are again noted in the lungs bilaterally, generally increased in size compared to the prior examination. The largest of these is in the left upper  lobe near the apex (axial image 17 of series 2) measuring 5.5 x 5.6 cm, significantly increased from 2.1 x 1.8 cm on the prior examination. This lesion is centrally low-attenuation, suggesting internal areas of necrosis. Small bilateral pleural effusions. New large left pneumothorax with considerable atelectasis in the left lung. Musculoskeletal: There are no aggressive appearing lytic or blastic lesions noted in the visualized portions of the skeleton. CT ABDOMEN PELVIS FINDINGS  Hepatobiliary: No suspicious cystic or solid hepatic lesions. No intra or extrahepatic biliary ductal dilatation. Status post cholecystectomy. Pancreas: No pancreatic mass. No pancreatic ductal dilatation. No pancreatic or peripancreatic fluid collections or inflammatory changes. Spleen: Unremarkable. Adrenals/Urinary Tract: In the upper pole the left kidney there is enhancing lesion measuring 1.7 cm in diameter, increased in size compared to the prior study (previously 1.2 cm), concerning for neoplasm. Exophytic 1.2 cm low-attenuation lesion in the lower pole the left kidney, compatible with a small simple cyst. Right kidney and bilateral adrenal glands are normal in appearance. No hydroureteronephrosis. Urinary bladder is normal in appearance. Stomach/Bowel: The appearance of the stomach is normal. There is no pathologic dilatation of small bowel or colon. Normal appendix. Vascular/Lymphatic: Aortic atherosclerosis, without evidence of aneurysm or dissection in the abdominal or pelvic vasculature. No lymphadenopathy noted in the abdomen or pelvis. Reproductive: Prostate gland and seminal vesicles are unremarkable in appearance. Other: No significant volume of ascites.  No pneumoperitoneum. Musculoskeletal: There are no aggressive appearing lytic or blastic lesions noted in the visualized portions of the skeleton. IMPRESSION: 1. New large left hydropneumothorax. 2. Widespread metastatic disease to the lungs appears progressive compared to the prior study, as above. 3. Enlarging probably enhancing lesion in the upper pole the left kidney concerning for neoplasm, either metastatic or primary. 4. Small right pleural effusion. 5. Aortic atherosclerosis, in addition to left main and 2 vessel coronary artery disease. Please note that although the presence of coronary artery calcium documents the presence of coronary artery disease, the severity of this disease and any potential stenosis cannot be assessed on this  non-gated CT examination. Assessment for potential risk factor modification, dietary therapy or pharmacologic therapy may be warranted, if clinically indicated. 6. Additional incidental findings, as above. Critical Value/emergent results were called by telephone at the time of interpretation on 02/06/2020 at 11:35 am to provider [facility] , who verbally acknowledged these results. Electronically Signed   By: Vinnie Langton M.D.   On: 02/06/2020 11:36   DG CHEST PORT 1 VIEW  Result Date: 03/06/2020 CLINICAL DATA:  Shortness of breath. EXAM: PORTABLE CHEST 1 VIEW COMPARISON:  03/05/2020. FINDINGS: PowerPort catheter with tip over superior vena cava. Heart size stable. Persistent bilateral pulmonary masses are again noted findings again most consistent metastatic disease. Tiny left apical pneumothorax may remain. Small left pleural effusion. IMPRESSION: 1. Persistent bilateral pulmonary masses most consistent with metastatic disease again noted. No interim change. Small left pleural effusion. 2. Tiny left apical pneumothorax may remain. Electronically Signed   By: Marcello Moores  Register   On: 03/06/2020 05:26   DG CHEST PORT 1 VIEW  Result Date: 03/05/2020 CLINICAL DATA:  Left pneumothorax. EXAM: PORTABLE CHEST 1 VIEW COMPARISON:  March 04, 2020. FINDINGS: Stable cardiomediastinal silhouette. Right internal jugular Port-A-Cath is unchanged in position. Small left apical pneumothorax may be present. Stable bilateral lung opacities are noted concerning for metastatic disease. Bony thorax is unremarkable. IMPRESSION: Small left apical pneumothorax may be present. Stable bilateral lung opacities concerning for metastatic disease. Electronically Signed   By: Jeneen Rinks  Murlean Caller M.D.   On: 03/05/2020 09:48   DG CHEST PORT 1 VIEW  Result Date: 03/04/2020 CLINICAL DATA:  Chest tube removal. EXAM: PORTABLE CHEST 1 VIEW COMPARISON:  Same day. FINDINGS: Stable cardiomediastinal silhouette. Left-sided chest tube  has been removed. No definite pneumothorax is noted right internal jugular Port-A-Cath is unchanged. Stable bilateral rounded opacities are noted concerning for metastatic disease. Bony thorax is unremarkable. IMPRESSION: No pneumothorax status post left-sided chest tube removal. Stable bilateral rounded opacities concerning for metastatic disease. Electronically Signed   By: Marijo Conception M.D.   On: 03/04/2020 16:34   DG Chest Port 1 View  Result Date: 03/04/2020 CLINICAL DATA:  Left pneumothorax EXAM: PORTABLE CHEST 1 VIEW COMPARISON:  Radiograph 03/02/2020, CT 02/06/2020 FINDINGS: Left chest tube is been significantly retracted now with side ports partially external to the thoracic cavity. Some persistent subcutaneous emphysema seen in the left chest wall. Accessed right IJ approach Port-A-Cath tip terminates at superior cavoatrial junction. Telemetry leads overlie the chest. Treat decreasing left pleural effusion. No discernible residual pneumothorax component is seen. Persistent bilateral airspace opacities most pronounced in the right mid to lower lung and left upper lobe. Stable cardiomediastinal contours. IMPRESSION: 1. Left chest tube significantly retracted. Now with side ports partially external to the thoracic cavity. 2. Decrease in the size of a left pleural effusion. No discernible residual pneumothorax component. 3. Persistent bilateral airspace opacities, most pronounced in the right mid to lower lung and left upper lobe. Electronically Signed   By: Lovena Le M.D.   On: 03/04/2020 05:51   DG Chest Portable 1 View  Result Date: 03/02/2020 CLINICAL DATA:  Post chest tube placement. EXAM: PORTABLE CHEST 1 VIEW COMPARISON:  X-ray earlier in the same day FINDINGS: There is been interval placement of a left-sided chest tube with near complete resolution of the left-sided pneumothorax. There is subcutaneous gas along the patient's left flank. There are small bilateral pleural effusions. There  are persistent bilateral airspace opacities, greatest within the right lower lung zone and left upper lobe. The right-sided Port-A-Cath is stable in positioning. The heart size is stable. IMPRESSION: Near complete resolution of the previously seen left-sided pneumothorax status post chest tube placement. Persistent bilateral airspace opacities and small bilateral pleural effusions. Electronically Signed   By: Constance Holster M.D.   On: 03/02/2020 19:08   DG Chest Portable 1 View  Result Date: 03/02/2020 CLINICAL DATA:  60 year old male with a history of shortness of breath EXAM: PORTABLE CHEST 1 VIEW COMPARISON:  02/27/2020 FINDINGS: Cardiomediastinal silhouette unchanged in size and contour. Recurrent left-sided pneumothorax status post left chest tube removal. Opacity within the left lung, particularly at the apex. Reticulonodular opacities of the right lung, unchanged. Right IJ port catheter unchanged. No right-sided pneumothorax or pleural effusion. IMPRESSION: Recurrent left-sided pneumothorax, status post left pigtail thoracostomy tube removal. These results were discussed by telephone at the time of interpretation on 03/02/2020 at 3:24 pm with the emergency room staff. Similar appearance of nodular opacities of the lung in this patient with known metastatic lung carcinoma. Unchanged right IJ port. Electronically Signed   By: Corrie Mckusick D.O.   On: 03/02/2020 15:25   DG Chest Port 1 View  Result Date: 02/27/2020 CLINICAL DATA:  Pneumothorax.  History of liposarcoma. EXAM: PORTABLE CHEST 1 VIEW COMPARISON:  02/26/2020. 02/25/2020. 02/22/2020. CT chest 02/06/2020. FINDINGS: PowerPort catheter and left chest tube in stable position. Tiny left apical pneumothorax again noted without interim change. Multiple bilateral pulmonary masses again noted  consistent metastatic disease. No pleural effusion. Stable cardiomegaly. IMPRESSION: 1. PowerPort catheter and left chest tube in stable position. Tiny left  apical pneumothorax again noted without interim change. 2. Multiple bilateral pulmonary masses again noted without interim change. Electronically Signed   By: Marcello Moores  Register   On: 02/27/2020 05:44   DG CHEST PORT 1 VIEW  Result Date: 02/26/2020 CLINICAL DATA:  Shortness of breath. Chest tube present. Known liposarcoma EXAM: PORTABLE CHEST 1 VIEW COMPARISON:  February 25, 2020. FINDINGS: Chest tube position the left is stable. Trace pneumothorax noted on the left. Port-A-Cath tip is at the cavoatrial junction. Multiple mass lesions remain. Left base and right midlung atelectatic changes are stable. Heart is upper normal in size with pulmonary vascularity normal. No adenopathy is appreciable by radiography. No bone lesions. IMPRESSION: Chest tube on the left with trace apical pneumothorax on the left. Multiple mass lesions remain. Areas of atelectatic change are essentially stable. Stable cardiac silhouette. Electronically Signed   By: Lowella Grip III M.D.   On: 02/26/2020 08:10   DG Chest Port 1 View  Result Date: 02/25/2020 CLINICAL DATA:  Chest tube present EXAM: PORTABLE CHEST 1 VIEW COMPARISON:  February 24, 2020. FINDINGS: Chest tube position on the left is unchanged. Port-A-Cath tip is at the cavoatrial junction, stable. Subcutaneous air remains on the left. No pneumothorax evident. Multiple mass lesions again noted consistent with known metastatic foci. Atelectatic change stable in left base and right mid lung regions. New mild atelectatic change left mid lung. Heart upper normal in size, stable, with pulmonary vascularity normal. No appreciable adenopathy. IMPRESSION: Stable tube and catheter positions. No pneumothorax. Metastatic disease again noted. Areas of atelectatic change in the right mid lung and left base noted. New mild atelectasis left mid lung. Stable cardiac silhouette. Electronically Signed   By: Lowella Grip III M.D.   On: 02/25/2020 10:09   DG Chest Port 1 View  Result  Date: 02/24/2020 CLINICAL DATA:  Liposarcoma. Chest tube in place for recent pneumothorax EXAM: PORTABLE CHEST 1 VIEW COMPARISON:  February 23, 2020 FINDINGS: Chest tube remains on the left, unchanged in position. There is subcutaneous air on the left is again noted. Port-A-Cath tip is in the superior vena cava near the cavoatrial junction. Currently there is no appreciable pneumothorax. Multiple mass lesions remain, largest in left upper lobe, consistent with metastatic foci. Atelectatic change noted in left base and right mid lung. Heart upper normal in size with pulmonary vascularity normal. No adenopathy appreciable by radiography. Degenerative change noted in each shoulder. IMPRESSION: Tube and catheter positions as described without appreciable pneumothorax. Pulmonary metastases again noted without change. There is left base and right midlung atelectatic change. No consolidation. Stable cardiac silhouette. Electronically Signed   By: Lowella Grip III M.D.   On: 02/24/2020 09:08   DG Chest Port 1 View  Result Date: 02/23/2020 CLINICAL DATA:  60 year old male with metastatic sarcoma. Pneumothorax, chest tube. EXAM: PORTABLE CHEST 1 VIEW COMPARISON:  Portable chest 02/22/2020 and earlier. FINDINGS: Stable left chest tube. Small left pneumothorax has decreased, trace residual with pleural edge visible laterally. Small volume left chest wall gas has decreased. Underlying pulmonary metastatic disease, stable bilateral pulmonary masses. Stable cardiac size and mediastinal contours. Stable right chest power port. Visualized tracheal air column is within normal limits. No new pulmonary opacity. Stable visualized osseous structures. IMPRESSION: 1. Stable left chest tube. Decreased and now trace left pneumothorax. 2. Stable pulmonary metastatic disease. Electronically Signed   By: Genevie Ann  M.D.   On: 02/23/2020 06:34   DG CHEST PORT 1 VIEW  Result Date: 02/22/2020 CLINICAL DATA:  Follow-up chest tube EXAM:  PORTABLE CHEST 1 VIEW COMPARISON:  02/21/2020 FINDINGS: Cardiac shadow is enlarged but stable. Right chest wall port is again seen. Bilateral metastatic lesions are again identified and stable. Pigtail catheter is noted on the left. Small pneumothorax is again seen. No new focal abnormality is noted. IMPRESSION: Stable small left pneumothorax. Multifocal pulmonary metastatic disease as described. Electronically Signed   By: Inez Catalina M.D.   On: 02/22/2020 10:06   DG Chest Portable 1 View  Result Date: 02/21/2020 CLINICAL DATA:  Chest tube placement for pneumothorax EXAM: PORTABLE CHEST 1 VIEW COMPARISON:  February 21, 2020 study obtained earlier in the day FINDINGS: Chest tube now present on the left with tip directed medially. Small residual lateral pneumothorax without tension component. Subcutaneous air noted on the left. Multiple mass lesions again noted throughout the lungs, largest left upper lobe region. Atelectatic change again noted in right mid and lower lung regions. Heart size and pulmonary vascularity are normal. No adenopathy evident. Port-A-Cath tip in superior vena cava. IMPRESSION: Left-sided pneumothorax much smaller after chest tube placement. No tension component. 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 Atelectatic change again noted on the right. Stable cardiac silhouette. Electronically Signed   By: Lowella Grip III M.D.   On: 02/21/2020 11:25   DG CHEST PORT 1 VIEW  Result Date: 02/21/2020 CLINICAL DATA:  Known pneumothorax. History of lower extremity liposarcoma EXAM: PORTABLE CHEST 1 VIEW COMPARISON:  February 20, 2020 chest radiograph and chest CT February 06, 2020 FINDINGS: Previously noted pneumothorax on the left is unchanged without appreciable tension component. Subcutaneous air noted along the lateral left hemithorax. Multiple mass lesions throughout the lungs, largest in the left upper lobe, remain without change. Atelectatic change noted in right mid  lung. No new opacity evident. Heart is upper normal in size with pulmonary vascularity within normal limits. No adenopathy evident. Port-A-Cath tip in superior vena cava. No bone lesions. IMPRESSION: Stable left-sided pneumothorax that tension component. Multiple mass lesions throughout the lungs again noted, largest left upper lobe consistent with known neoplasm. Atelectasis right mid lung. No new opacity evident compared to 1 day prior. Stable cardiac silhouette. Electronically Signed   By: Lowella Grip III M.D.   On: 02/21/2020 08:13   DG Chest Port 1 View  Result Date: 02/20/2020 CLINICAL DATA:  Pneumothorax follow-up EXAM: PORTABLE CHEST 1 VIEW COMPARISON:  February 20, 2020 FINDINGS: There is a persistent unchanged left-sided pneumothorax. There is subcutaneous gas along the patient's left flank. There are airspace opacities involving the left upper lung zone and right lung base. There is a well-positioned right-sided Port-A-Cath. No definite right-sided pneumothorax. There is a small right-sided pleural effusion. IMPRESSION: No significant interval change. Persistent bilateral airspace opacities in addition to a moderate-sized left-sided pneumothorax. Electronically Signed   By: Constance Holster M.D.   On: 02/20/2020 18:47   DG Chest Port 1 View  Result Date: 02/12/2020 CLINICAL DATA:  60 year old male with history of pneumothorax status post chest tube placement. EXAM: PORTABLE CHEST 1 VIEW COMPARISON:  Chest x-ray 02/09/2020. FINDINGS: Right internal jugular single-lumen power porta cath with tip terminating at the superior cavoatrial junction. Left-sided chest tube in position with tip and side port projecting over the lower left hemithorax. Small persistent left pneumothorax, increased slightly in size compared to the prior study. No right-sided pneumothorax. Multiple pulmonary nodules and masses again noted,  better demonstrated on prior chest CT 02/06/2020. No definite acute consolidative  airspace disease. No pleural effusions. No evidence of pulmonary edema. Heart size is normal. Upper mediastinal contours are within normal limits. Extensive subcutaneous emphysema in the left chest wall again noted. IMPRESSION: 1. Support apparatus, as above. 2. Increased size of small left-sided pneumothorax. 3. Multiple pulmonary nodules and masses, similar to prior examinations, presumably reflective of metastatic disease. Electronically Signed   By: Vinnie Langton M.D.   On: 02/12/2020 08:16   DG CHEST PORT 1 VIEW  Result Date: 02/09/2020 CLINICAL DATA:  Follow-up hydropneumothorax. Status post chest tube removal. EXAM: PORTABLE CHEST 1 VIEW COMPARISON:  02/09/2020 FINDINGS: Right chest wall port a catheter is noted with tip at the cavoatrial junction. The left-sided chest tube has been removed. Small left apical pneumothorax persists. Unchanged appearance of gas within the soft tissues of the left chest wall. Masslike opacities within the left upper lobe and right base are unchanged. IMPRESSION: Persistent small left apical pneumothorax status post left-sided chest tube removal. Electronically Signed   By: Kerby Moors M.D.   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 Recent left pneumothorax. EXAM: PORTABLE CHEST 1 VIEW COMPARISON:  Portable chest 02/08/2020 and earlier. FINDINGS: Portable AP upright view at 0816 hours. Left chest tube is in place although there may be side holes outside of the left pleural space (arrow). A small volume of left chest wall gas has increased. Small residual left pneumothorax. No mediastinal shift. Masslike bilateral pulmonary opacities compatible with known metastases. Stable cardiac size and mediastinal contours. Visualized tracheal air column is within normal limits. Stable right chest porta cath. Negative visible bowel gas pattern. Stable visualized osseous structures. IMPRESSION:  1. Left chest tube in place although there may be tube side holes outside of the pleural space. A small volume of chest wall gas has increased. 2. Stable small residual pneumothorax. 3. Stable pulmonary metastases. Electronically Signed   By: Genevie Ann M.D.   On: 02/09/2020 08:25   DG CHEST PORT 1 VIEW  Result Date: 02/08/2020 CLINICAL DATA:  Hydropneumothorax, chest tube on LEFT EXAM: PORTABLE CHEST 1 VIEW COMPARISON:  Portable exam 1027 hours compared to 02/07/2020 FINDINGS: Pigtail RIGHT thoracostomy tube, partially withdrawn since previous exam with tip projecting at LEFT costal margin. Persistent small LEFT apex pneumothorax. Basilar LEFT pleural effusion. Normal heart size mediastinal contours. RIGHT jugular Port-A-Cath stable tip projecting over SVC. Atelectasis versus infiltrate at RIGHT base. BILATERAL pulmonary nodules, largest a mass at LEFT apex. Bones demineralized. IMPRESSION: Persistent small LEFT hydropneumothorax. LEFT thoracostomy tube has been partially withdrawn with the pigtail now at LEFT costal margin. Electronically Signed   By: Lavonia Dana M.D.   On: 02/08/2020 15:11   DG Chest Portable 1 View  Result Date: 02/07/2020 CLINICAL DATA:  Follow-up left pneumothorax.  Lung carcinoma. EXAM: PORTABLE CHEST 1 VIEW COMPARISON:  Prior today FINDINGS: There has been placement of a left pleural pigtail catheter since previous study, with near complete resolution of left pneumothorax since prior exam. Multiple pulmonary nodules and masses are again seen in both lungs, largest in the left upper lobe and right lower lobe. Small right pleural effusion remains stable. Right-sided Port-A-Cath remains in appropriate position. IMPRESSION: Near complete resolution of left pneumothorax following left pleural catheter placement. Electronically Signed   By: Marlaine Hind M.D.   On: 02/07/2020 14:55   DG Chest Portable 1 View  Result Date: 02/07/2020 CLINICAL DATA:  Left pneumothorax. EXAM: PORTABLE CHEST 1  VIEW COMPARISON:  CT 02/06/2020. FINDINGS: Mediastinum hilar structures normal. Borderline cardiomegaly. Large left pneumothorax noted. Similar finding noted on prior CT. Prominent atelectasis left lung. Multiple prominent bilateral pulmonary masses again noted as noted on prior CT. No acute bony abnormality identified. Contrast in the colon. IMPRESSION: 1. Large left pneumothorax. Similar finding noted on prior CT. Prominent atelectasis left lung. 2. Multiple prominent bilateral pulmonary masses again noted as noted on prior CT. Electronically Signed   By: Marcello Moores  Register   On: 02/07/2020 13:50   DG Chest Port 1V same Day  Result Date: 02/09/2020 CLINICAL DATA:  Shortness of breath, history of metastatic lung carcinoma EXAM: PORTABLE CHEST 1 VIEW COMPARISON:  Film from earlier in the same day. FINDINGS: Cardiac shadow is stable. Left-sided chest tube is again identified. Small left apical pneumothorax is again identified stable in appearance. Stable left upper lobe mass lesion is noted. Nodules are noted in the right lung as well as patchy atelectatic changes in the bases bilaterally. Considerable subcutaneous emphysema is noted. No bony abnormality is seen. Right chest wall port is noted. IMPRESSION: Stable left apical pneumothorax with chest tube in place. Changes of metastatic disease within the lungs. Electronically Signed   By: Inez Catalina M.D.   On: 02/09/2020 23:23   DG Chest Port 1V same Day  Result Date: 02/09/2020 CLINICAL DATA:  Pneumothorax.  Chest tube placement. EXAM: PORTABLE CHEST 1 VIEW COMPARISON:  February 09, 2020 FINDINGS: There has been interval placement of a left-sided chest tube. There is a small residual left-sided pneumothorax, improved from prior study. The right-sided Port-A-Cath is well position. Bilateral airspace opacities are again noted, not substantially changed from prior study. There is subcutaneous emphysema along the patient's left flank, stable to improved from prior  study. The heart size is unchanged. IMPRESSION: 1. Interval placement of a left-sided chest tube. 2. Small residual left-sided pneumothorax, improved from prior study. 3. Otherwise, no significant short interval change. Electronically Signed   By: Constance Holster M.D.   On: 02/09/2020 16:25   DG Chest Port 1V same Day  Result Date: 02/09/2020 CLINICAL DATA:  Chest tube removal EXAM: PORTABLE CHEST 1 VIEW COMPARISON:  02/09/2020 FINDINGS: Further increased size of the left pneumothorax, now noted laterally and at the left apex, approximately 15%. Right Port-A-Cath remains in place, unchanged. Airspace opacity at the right lung base is unchanged. Heart is borderline in size. Subcutaneous emphysema throughout the left chest wall. IMPRESSION: Enlarging left pneumothorax, now approximately 15%. Otherwise no change. These results will be called to the ordering clinician or representative by the Radiologist Assistant, and communication documented in the PACS or [employer]. Electronically Signed   By: Rolm Baptise M.D.   On: 02/09/2020 15:33       Subjective: Patient seen and examined at bedside.  Denies worsening shortness of breath or chest pain or fever.  He is okay to go home today.  Discharge Exam: Vitals:   03/06/20 0349 03/06/20 0757  BP: 115/82 111/77  Pulse: 76 80  Resp: 10 13  Temp:  98.5 F (36.9 C)  SpO2: 95% 95%    General: Pt is alert, awake, not in acute distress.  Looks chronically ill.  Currently on room air. Cardiovascular: rate controlled, S1/S2 + Respiratory: bilateral decreased breath sounds at bases with some scattered crackles Abdominal: Soft, NT, ND, bowel sounds + Extremities: Trace lower extremity edema; no cyanosis    The  results of significant diagnostics from this hospitalization (including imaging, microbiology, ancillary and laboratory) are listed below for reference.     Microbiology: Recent Results (from the past 240 hour(s))  Resp Panel by  RT-PCR (Flu A&B, Covid) Nasopharyngeal Swab     Status: None   Collection Time: 03/02/20  6:47 PM   Specimen: Nasopharyngeal Swab; Nasopharyngeal(NP) swabs in vial transport medium  Result Value Ref Range Status   SARS Coronavirus 2 by RT PCR NEGATIVE NEGATIVE Final    Comment: (NOTE) SARS-CoV-2 target nucleic acids are NOT DETECTED.  The SARS-CoV-2 RNA is generally detectable in upper respiratory specimens during the acute phase of infection. The lowest concentration of SARS-CoV-2 viral copies this assay can detect is 138 copies/mL. A negative result does not preclude SARS-Cov-2 infection and should not be used as the sole basis for treatment or other patient management decisions. A negative result may occur with  improper specimen collection/handling, submission of specimen other than nasopharyngeal swab, presence of viral mutation(s) within the areas targeted by this assay, and inadequate number of viral copies(<138 copies/mL). A negative result must be combined with clinical observations, patient history, and epidemiological information. The expected result is Negative.  Fact Sheet for Patients:  EntrepreneurPulse.com.au  Fact Sheet for Healthcare Providers:  IncredibleEmployment.be  This test is no t yet approved or cleared by the [place] FDA and  has been authorized for detection and/or diagnosis of SARS-CoV-2 by FDA under an Emergency Use Authorization (EUA). This EUA will remain  in effect (meaning this test can be used) for the duration of the COVID-19 declaration under Section 564(b)(1) of the Act, 21 U.S.C.section 360bbb-3(b)(1), unless the authorization is terminated  or revoked sooner.       Influenza A by PCR NEGATIVE NEGATIVE Final   Influenza B by PCR NEGATIVE NEGATIVE Final    Comment: (NOTE) The Xpert Xpress SARS-CoV-2/FLU/RSV plus assay is intended as an aid in the diagnosis of influenza from Nasopharyngeal swab  specimens and should not be used as a sole basis for treatment. Nasal washings and aspirates are unacceptable for Xpert Xpress SARS-CoV-2/FLU/RSV testing.  Fact Sheet for Patients: EntrepreneurPulse.com.au  Fact Sheet for Healthcare Providers: IncredibleEmployment.be  This test is not yet approved or cleared by the [place] FDA and has been authorized for detection and/or diagnosis of SARS-CoV-2 by FDA under an Emergency Use Authorization (EUA). This EUA will remain in effect (meaning this test can be used) for the duration of the COVID-19 declaration under Section 564(b)(1) of the Act, 21 U.S.C. section 360bbb-3(b)(1), unless the authorization is terminated or revoked.  Performed at [facility], [address] [place], [place] [address]   MRSA PCR Screening     Status: None   Collection Time: 03/02/20 10:14 PM   Specimen: Nasal Mucosa; Nasopharyngeal  Result Value Ref Range Status   MRSA by PCR NEGATIVE NEGATIVE Final    Comment:        The GeneXpert MRSA Assay (FDA approved for NASAL specimens only), is one component of a comprehensive MRSA colonization surveillance program. It is not intended to diagnose MRSA infection nor to guide or monitor treatment for MRSA infections. Performed at [facility], [place] [address] [place], [place] [address]      Labs: BNP (last 3 results) No results for input(s): BNP in the last 8760 hours. Basic Metabolic Panel: Recent Labs  Lab 02/29/20 0330 03/02/20 1543 03/03/20 0426 03/05/20 0432  NA 138 138 137 136  K 4.1 4.4 3.7 3.6  CL 104 106 105 104  CO2 25 26 22 25   GLUCOSE 95 103* 79 105*  BUN 15 13 14 12   CREATININE 0.75 0.61 0.71 0.57*  CALCIUM 8.6* 9.2 8.7* 8.3*   Liver Function Tests: No results for input(s): AST, ALT, ALKPHOS, BILITOT, PROT, ALBUMIN in the last 168 hours. No results for input(s): LIPASE, AMYLASE in the last 168 hours. No results for input(s): AMMONIA in  the last 168 hours. CBC: Recent Labs  Lab 02/29/20 0330 03/02/20 1543 03/03/20 0426 03/05/20 0432  WBC 8.3 7.7 6.4 5.3  NEUTROABS  --   --   --  3.3  HGB 11.5* 14.2 12.7* 11.7*  HCT 34.1* 44.4 39.9 36.6*  MCV 96.9 99.1 98.3 98.1  PLT 233 295 248 217   Cardiac Enzymes: No results for input(s): CKTOTAL, CKMB, CKMBINDEX, TROPONINI in the last 168 hours. BNP: Invalid input(s): POCBNP CBG: No results for input(s): GLUCAP in the last 168 hours. D-Dimer No results for input(s): DDIMER in the last 72 hours. Hgb A1c No results for input(s): HGBA1C in the last 72 hours. Lipid Profile No results for input(s): CHOL, HDL, LDLCALC, TRIG, CHOLHDL, LDLDIRECT in the last 72 hours. Thyroid function studies No results for input(s): TSH, T4TOTAL, T3FREE, THYROIDAB in the last 72 hours.  Invalid input(s): FREET3 Anemia work up No results for input(s): VITAMINB12, FOLATE, FERRITIN, TIBC, IRON, RETICCTPCT in the last 72 hours. Urinalysis No results found for: COLORURINE, APPEARANCEUR, [place], [place], GLUCOSEU, [place], [place], [place], PROTEINUR, UROBILINOGEN, NITRITE, LEUKOCYTESUR Sepsis Labs Invalid input(s): PROCALCITONIN,  WBC,  LACTICIDVEN Microbiology Recent Results (from the past 240 hour(s))  Resp Panel by RT-PCR (Flu A&B, Covid) Nasopharyngeal Swab     Status: None   Collection Time: 03/02/20  6:47 PM   Specimen: Nasopharyngeal Swab; Nasopharyngeal(NP) swabs in vial transport medium  Result Value Ref Range Status   SARS Coronavirus 2 by RT PCR NEGATIVE NEGATIVE Final    Comment: (NOTE) SARS-CoV-2 target nucleic acids are NOT DETECTED.  The SARS-CoV-2 RNA is generally detectable in upper respiratory specimens during the acute phase of infection. The lowest concentration of SARS-CoV-2 viral copies this assay can detect is 138 copies/mL. A negative result does not preclude SARS-Cov-2 infection and should not be used as the sole basis for treatment or other patient management  decisions. A negative result may occur with  improper specimen collection/handling, submission of specimen other than nasopharyngeal swab, presence of viral mutation(s) within the areas targeted by this assay, and inadequate number of viral copies(<138 copies/mL). A negative result must be combined with clinical observations, patient history, and epidemiological information. The expected result is Negative.  Fact Sheet for Patients:  EntrepreneurPulse.com.au  Fact Sheet for Healthcare Providers:  IncredibleEmployment.be  This test is no t yet approved or cleared by the [place] FDA and  has been authorized for detection and/or diagnosis of SARS-CoV-2 by FDA under an Emergency Use Authorization (EUA). This EUA will remain  in effect (meaning this test can be used) for the duration of the COVID-19 declaration under Section 564(b)(1) of the Act, 21 U.S.C.section 360bbb-3(b)(1), unless the authorization is terminated  or revoked sooner.       Influenza A by PCR NEGATIVE NEGATIVE Final   Influenza B by PCR NEGATIVE NEGATIVE Final    Comment: (NOTE) The Xpert Xpress SARS-CoV-2/FLU/RSV plus assay is intended as an aid in the diagnosis of influenza from Nasopharyngeal swab specimens and should not be used as a sole basis for treatment. Nasal washings and aspirates are  unacceptable for Xpert Xpress SARS-CoV-2/FLU/RSV testing.  Fact Sheet for Patients: EntrepreneurPulse.com.au  Fact Sheet for Healthcare Providers: IncredibleEmployment.be  This test is not yet approved or cleared by the [place] FDA and has been authorized for detection and/or diagnosis of SARS-CoV-2 by FDA under an Emergency Use Authorization (EUA). This EUA will remain in effect (meaning this test can be used) for the duration of the COVID-19 declaration under Section 564(b)(1) of the Act, 21 U.S.C. section 360bbb-3(b)(1), unless the  authorization is terminated or revoked.  Performed at [facility], [address] [place], [place] [address]   MRSA PCR Screening     Status: None   Collection Time: 03/02/20 10:14 PM   Specimen: Nasal Mucosa; Nasopharyngeal  Result Value Ref Range Status   MRSA by PCR NEGATIVE NEGATIVE Final    Comment:        The GeneXpert MRSA Assay (FDA approved for NASAL specimens only), is one component of a comprehensive MRSA colonization surveillance program. It is not intended to diagnose MRSA infection nor to guide or monitor treatment for MRSA infections. Performed at [facility], [place] [address], [place], [place] [address]      Time coordinating discharge: 35 minutes  SIGNED:   Aline August, MD  [facility] 03/06/2020, 9:30 AM

## 2020-03-06 NOTE — Progress Notes (Signed)
PCS request form faxed to [facility]

## 2020-03-06 NOTE — Progress Notes (Signed)
D/C instructions reviewed with patient, pt verbalized understanding.  IV team to deaccess port.  Rolling walker delivered to pt's room.  Pt to D/C home with wife.

## 2020-03-06 NOTE — Patient Outreach (Signed)
[facility] Care Management  03/06/2020  Leonard [name] July 11, 1960 [id number]   [facility] Telephone Assessment/Screen post hospital/complex care referral  Referral Date: 02/29/20 Referral Source: [facility] hospital liaison Referral Reason: high risk member, [employer], diagnosis cancer with mets, complex care & disease management follow up calls & assess for additional support, wife is sick also per Inpatient TOC CM, further needs Insurance:[employer]  Last admission: - 03/02/20-03/06/20 spontaneous hydropneumothorax, recurrent in the setting of metastatic disease to th lung, 02/20/20- 02/29/20 diarrhea, hypokalemia, spontaneous hydropneumothorax, recurrent in the setting of metastatic disease to th lung, LLL, hemoptysis   Transition of care services noted to be completed by primary care MD office staff- Dr Celene Squibb pcp Transition of Care will be completed by primary care provider office who will refer to [facility] care management if needed.      Outreach attempt # 1 successful Patient is able to verify HIPAA, DOB and address Reviewed and addressed referral to [facility] with patient Consent: [facility] RN CM reviewed [facility] services with patient. Patient gave verbal consent for services [facility] telephonic RN CM. Leonard [name] is preferring outreaches to him be changed to his phone vs his wife's  This was updated in [place] to be seen on 03/07/20  Wheezing -coughing blood today  Reports he is not sure if he is to be seen by hospice providers Reports visual concerns even with his eyeglasses Noted inpatient [facility] staff assisted with resumption orders of [facility] PT/OT for [facility] services  Social: Lives at home with wife who also has cancer Son to provide transportation     Patient Active Problem List   Diagnosis Date Noted  . Spontaneous pneumothorax 02/20/2020  . Left-sided hydropneumothorax--associated with necrotic malignant mass 02/09/2020  . Pneumothorax on  left 02/07/2020  . Dehydration 08/21/2019  . Port-A-Cath in place 05/19/2019  . Goals of care, counseling/discussion 05/15/2019  . 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 Liposarcoma of left lower extremity ([facility]) 04/25/2019  . Leg mass, left 08/02/2018  . Localized swelling of left lower leg 08/01/2018  . Food impaction of esophagus 06/27/2017  . NSAID induced gastritis   . Esophageal dysphagia      DME: walker eyeglasses cane  Medications: Cough syrup is not being paid for by his insurance carrier at the cost of $90 out of pocket    Plan: Patient agrees to the care plan and follow up  Within the next 7-10 business days   Jenny Lai L. Lavina Hamman, RN, BSN, [place] Coordinator Office number [phone] Mobile number [phone]  Main [facility] number [phone] Fax number [phone]

## 2020-03-06 NOTE — TOC Transition Note (Signed)
Transition of Care (TOC) - CM/SW Discharge Note Marvetta Gibbons RN,BSN Transitions of Care Unit 4NP (non trauma) - RN Case Manager See Treatment Team for direct Phone #   Patient Details  Name: Leonard [name] MRN: [id number] Date of Birth: Mar 06, 1960  Transition of Care [facility]) CM/SW Contact:  Dawayne Patricia, RN Phone Number: 03/06/2020, 11:54 AM   Clinical Narrative:    Pt stable for transition home, from home with wife. Notified that pt needed RW for home per wife- order placed- call made to [employer] for DME need- RW delivered to room prior to discharge.   Also noted pt was [facility] with [facility] on last admit for HHPT/OT- call made to [place] with [facility] to check status- was informed they did have referral however pt returned to hospital prior to start of care visit- resumption orders placed for HHPT/OT for [facility] to reschedule start of care for HHPT/OT needs.    Final next level of care: Home w Home Health Services Barriers to Discharge: No Barriers Identified   Patient Goals and CMS Choice Patient states their goals for this hospitalization and ongoing recovery are:: return home with wife CMS Medicare.gov Compare Post Acute Care list provided to:: Patient Choice offered to / list presented to : Patient  Discharge Placement                Home with [facility]        Discharge Plan and Services   Discharge Planning Services: CM Consult Post Acute Care Choice: Home Health,Resumption of Svcs/PTA Provider          DME Arranged: [place] rolling DME Agency: [employer] Date DME Agency Contacted: 03/06/20 Time DME Agency Contacted: [id number] Representative spoke with at DME Agency: Freda Munro [place]: PT,OT [place] Agency: [facility] [employer]) Date [place]: 03/06/20 Time [place]: 1153 Representative spoke with at [place]: [place]  Social Determinants of Health ([place]) Interventions     Readmission Risk Interventions No flowsheet data  found.

## 2020-03-07 ENCOUNTER — Other Ambulatory Visit: Payer: Self-pay

## 2020-03-07 ENCOUNTER — Emergency Department (HOSPITAL_COMMUNITY): Payer: 59

## 2020-03-07 ENCOUNTER — Encounter (HOSPITAL_COMMUNITY): Payer: Self-pay | Admitting: Lab

## 2020-03-07 ENCOUNTER — Emergency Department (HOSPITAL_COMMUNITY)
Admission: EM | Admit: 2020-03-07 | Discharge: 2020-03-07 | Disposition: A | Discharge status: Home / Self Care | Payer: 59 | Attending: Emergency Medicine | Admitting: Emergency Medicine

## 2020-03-07 ENCOUNTER — Other Ambulatory Visit: Payer: Self-pay | Admitting: *Deleted

## 2020-03-07 ENCOUNTER — Encounter (HOSPITAL_COMMUNITY): Payer: Self-pay | Admitting: Emergency Medicine

## 2020-03-07 DIAGNOSIS — J939 Pneumothorax, unspecified: Secondary | ICD-10-CM | POA: Insufficient documentation

## 2020-03-07 DIAGNOSIS — J9381 Chronic pneumothorax: Secondary | ICD-10-CM

## 2020-03-07 DIAGNOSIS — Z20822 Contact with and (suspected) exposure to covid-19: Secondary | ICD-10-CM | POA: Diagnosis not present

## 2020-03-07 DIAGNOSIS — Z87891 Personal history of nicotine dependence: Secondary | ICD-10-CM | POA: Diagnosis not present

## 2020-03-07 DIAGNOSIS — Z85118 Personal history of other malignant neoplasm of bronchus and lung: Secondary | ICD-10-CM | POA: Insufficient documentation

## 2020-03-07 DIAGNOSIS — R0602 Shortness of breath: Secondary | ICD-10-CM | POA: Diagnosis present

## 2020-03-07 LAB — COMPREHENSIVE METABOLIC PANEL
ALT: 33 U/L (ref 0–44)
AST: 31 U/L (ref 15–41)
Albumin: 3.4 g/dL — ABNORMAL LOW (ref 3.5–5.0)
Alkaline Phosphatase: 126 U/L (ref 38–126)
Anion gap: 9 (ref 5–15)
BUN: 11 mg/dL (ref 6–20)
CO2: 21 mmol/L — ABNORMAL LOW (ref 22–32)
Calcium: 8.8 mg/dL — ABNORMAL LOW (ref 8.9–10.3)
Chloride: 106 mmol/L (ref 98–111)
Creatinine, Ser: 0.55 mg/dL — ABNORMAL LOW (ref 0.61–1.24)
GFR, Estimated: >60 mL/min (ref >60)
Glucose, Bld: 100 mg/dL — ABNORMAL HIGH (ref 70–99)
Potassium: 3.4 mmol/L — ABNORMAL LOW (ref 3.5–5.1)
Sodium: 136 mmol/L (ref 135–145)
Total Bilirubin: 0.6 mg/dL (ref 0.3–1.2)
Total Protein: 6.8 g/dL (ref 6.5–8.1)

## 2020-03-07 LAB — CBC WITH DIFFERENTIAL/PLATELET
Abs Immature Granulocytes: 0.03 10*3/uL (ref 0.00–0.07)
Basophils Absolute: 0 10*3/uL (ref 0.0–0.1)
Basophils Relative: 0 %
Eosinophils Absolute: 0.1 10*3/uL (ref 0.0–0.5)
Eosinophils Relative: 1 %
HCT: 42.4 % (ref 39.0–52.0)
Hemoglobin: 13.7 g/dL (ref 13.0–17.0)
Immature Granulocytes: 0 %
Lymphocytes Relative: 7 %
Lymphs Abs: 0.5 10*3/uL — ABNORMAL LOW (ref 0.7–4.0)
MCH: 31.2 pg (ref 26.0–34.0)
MCHC: 32.3 g/dL (ref 30.0–36.0)
MCV: 96.6 fL (ref 80.0–100.0)
Monocytes Absolute: 0.6 10*3/uL (ref 0.1–1.0)
Monocytes Relative: 7 %
Neutro Abs: 6.2 10*3/uL (ref 1.7–7.7)
Neutrophils Relative %: 85 %
Platelets: 279 10*3/uL (ref 150–400)
RBC: 4.39 MIL/uL (ref 4.22–5.81)
RDW: 14.7 % (ref 11.5–15.5)
WBC: 7.4 10*3/uL (ref 4.0–10.5)
nRBC: 0 % (ref 0.0–0.2)

## 2020-03-07 LAB — TROPONIN I (HIGH SENSITIVITY)
Troponin I (High Sensitivity): 5 ng/L (ref <18)
Troponin I (High Sensitivity): 6 ng/L (ref <18)

## 2020-03-07 LAB — RESP PANEL BY RT-PCR (FLU A&B, COVID) ARPGX2
Influenza A by PCR: NEGATIVE
Influenza B by PCR: NEGATIVE
SARS Coronavirus 2 by RT PCR: NEGATIVE

## 2020-03-07 MED ORDER — AEROCHAMBER PLUS FLO-VU MISC
1.0000 | Freq: Once | Status: AC
  Administered 2020-03-07: 1

## 2020-03-07 MED ORDER — FENTANYL CITRATE (PF) 100 MCG/2ML IJ SOLN
50.0000 ug | Freq: Once | INTRAMUSCULAR | Status: AC
Start: 2020-03-07 — End: 2020-03-07
  Administered 2020-03-07: 50 ug via INTRAVENOUS
  Filled 2020-03-07: qty 2

## 2020-03-07 MED ORDER — ALBUTEROL SULFATE HFA 108 (90 BASE) MCG/ACT IN AERS
2.0000 | INHALATION_SPRAY | Freq: Four times a day (QID) | RESPIRATORY_TRACT | Status: DC | PRN
  Administered 2020-03-07: 2 via RESPIRATORY_TRACT
  Filled 2020-03-07: qty 6.7

## 2020-03-07 MED ORDER — LEVOFLOXACIN 750 MG PO TABS: 750.0000 mg | ORAL_TABLET | Freq: Every day | ORAL | 0 refills | Status: AC

## 2020-03-07 MED ORDER — LEVOFLOXACIN 750 MG PO TABS
750.0000 mg | ORAL_TABLET | Freq: Once | ORAL | Status: AC
  Administered 2020-03-07: 750 mg via ORAL
  Filled 2020-03-07: qty 1

## 2020-03-07 MED ORDER — IOHEXOL 350 MG/ML SOLN
75.0000 mL | Freq: Once | INTRAVENOUS | Status: AC | PRN
  Administered 2020-03-07: 75 mL via INTRAVENOUS

## 2020-03-07 NOTE — ED Notes (Signed)
Pt ambulatory to restroom. Stated inhaler did not help much

## 2020-03-07 NOTE — Discharge Instructions (Addendum)
You have a very small collapsed lung at the top of your lung.  This is exactly the same as it was before.  There is no signs of blood clot.  There is a small to moderate amount of fluid in the left lung that is contributing to the shortness of breath - occasionally there is a pneumonia that is hiding out as well - behind the fluid or the tumor that has spread.    Take Levaquin once a day for the next 7 days Take albuterol, 2 puffs every 4 hours as needed for wheezing or shortness of breath  Return to the emergency department immediately for severe or worsening chest pain or shortness of breath or fever  Please follow-up with your family doctor or oncologist within 48 hours for recheck

## 2020-03-07 NOTE — ED Notes (Signed)
98% on room air.  HR 106 after ambulating

## 2020-03-07 NOTE — Patient Outreach (Signed)
[facility]) Care Management  03/07/2020  Jaece Ducharme Coyne 1960/06/15 [id number]   [place] coordination- Palliative/hospice  [facility] RN CM received outreach from [place] hospice to review pt case [place] [facility] is in network with [place] for palliative care services but hospice services   Plan [facility] RN CM to consult with [place] representative related to pt/wife palliative care/hospice options [facility] RN CM will speak with pt [facility] RN CM will update [employer] L. Lavina Hamman, RN, BSN, [place] Coordinator Office number [phone] Mobile number [phone]  Main THN number [phone] Fax number [phone]

## 2020-03-07 NOTE — ED Notes (Signed)
Port de-accessed

## 2020-03-07 NOTE — ED Notes (Signed)
Pt calling for ride

## 2020-03-07 NOTE — ED Provider Notes (Signed)
[facility] Provider Note   CSN: [id number] Arrival date & time: 03/07/20  1625     History Chief Complaint  Patient presents with  . Shortness of Breath    Leonard [name] is a 60 y.o. male presenting for evaluation of shortness of breath.  Patient states when he woke up this morning, he developed gradually worsening shortness of breath.  He reports associated lower chest pain, which extends bilaterally.  He was recently admitted with pneumothorax, discharged yesterday.  Chest tube was removed 3 days ago.  He denies recent fevers, chills, cough, nausea, vomiting, abdominal pain.  No recent change in medications.  He is not on blood thinner.  He is diagnosed with lung cancer, not currently on any chemo or radiation.  He states his symptoms today feel similar to previous episodes of pnx.  Additional history obtained from chart review.  Patient with a history metastatic lung cancer and subsequent multiple spontaneous pneumothorax in the past month.  I reviewed recent admission, including plan of care and critical care notes. Pt had talc pleurodesis on 2/12 with CTS.   HPI     Past Medical History:  Diagnosis Date  . Cancer [facility])    metastatic lung cancer  . Port-A-Cath in place 05/19/2019   right    Patient Active Problem List   Diagnosis Date Noted  . Spontaneous pneumothorax 02/20/2020  . Left-sided hydropneumothorax--associated with necrotic malignant mass 02/09/2020  . Pneumothorax on left 02/07/2020  . Dehydration 08/21/2019  . Port-A-Cath in place 05/19/2019  . Goals of care, counseling/discussion 05/15/2019  . 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 Leg mass, left 08/02/2018  . Localized swelling of left lower leg 08/01/2018  . Food impaction of esophagus 06/27/2017  . NSAID induced gastritis   . Esophageal dysphagia     Past Surgical History:  Procedure Laterality Date   . CHOLECYSTECTOMY    . ESOPHAGEAL DILATION N/A 06/27/2017   Procedure: ESOPHAGEAL DILATION;  Surgeon: Danie Binder, MD;  Location: [facility];  Service: Endoscopy;  Laterality: N/A;  . ESOPHAGOGASTRODUODENOSCOPY N/A 06/27/2017   Procedure: ESOPHAGOGASTRODUODENOSCOPY (EGD);  Surgeon: Danie Binder, MD;  Location: [facility] ENDO SUITE;  Service: Endoscopy;  Laterality: N/A;  . IR IMAGING GUIDED PORT INSERTION  05/04/2019  . knee sx         Family History  Problem Relation Age of Onset  . Heart disease Father   . Cancer Maternal Aunt   . Cancer Maternal Uncle   . Cancer Paternal Uncle   . Vision loss Maternal Grandfather   . Colon cancer Neg Hx   . Colon polyps Neg Hx     Social History   Tobacco Use  . Smoking status: Former Research scientist (life sciences)  . Smokeless tobacco: Never Used  Vaping Use  . Vaping Use: Never used  Substance Use Topics  . Alcohol use: Yes    Comment: 1-2 drinks per month  . Drug use: Never    Home Medications Prior to Admission medications   Medication Sig Start Date End Date Taking? Authorizing Provider  ALPRAZolam (XANAX) 0.25 MG tablet Take 1 tablet (0.25 mg total) by mouth 2 (two) times daily as needed for anxiety. Patient taking differently: Take 0.25 mg by mouth See admin instructions. Take 0.25 mg by mouth at bedtime and an additional 0.25 mg once a day as needed for anxiety 10/30/19  Yes [name], MD  benzonatate (TESSALON) 200 MG capsule Take 1 capsule (200 mg total) by mouth 3 (three) times daily. 02/20/20  Yes [name], MD  escitalopram (LEXAPRO) 10 MG tablet Take 1 tablet (10 mg total) by mouth at bedtime. 03/06/20  Yes Aline August, MD  gabapentin (NEURONTIN) 300 MG capsule Take 1 capsule (300 mg total) by mouth in the morning and at bedtime. 03/06/20 04/05/20 Yes Aline August, MD  HYDROcodone-homatropine (HYCODAN) 5-1.5 MG/5ML syrup Take 10 mLs by mouth every 6 (six) hours as needed for cough. SMARTSIG:1 Teaspoon By Mouth Every 6 Hours  PRN Patient taking differently: Take 10 mLs by mouth every 6 (six) hours as needed for cough. 02/20/20  Yes [name], MD  Oxycodone HCl 10 MG TABS Take 0.5 tablets (5 mg total) by mouth every 8 (eight) hours as needed. 01/03/20  Yes Tanner, Lyndon Code., PA-C  prochlorperazine (COMPAZINE) 10 MG tablet TAKE (1) TABLET BY MOUTH EVERY (6) HOURS AS NEEDED. Patient taking differently: Take 10 mg by mouth every 6 (six) hours as needed for nausea or vomiting. 01/02/20  Yes Iruku, Arletha Pili, MD  zolpidem (AMBIEN) 10 MG tablet Take 1 tablet (10 mg total) by mouth at bedtime as needed for sleep. Patient taking differently: Take 10 mg by mouth at bedtime. 01/31/20 03/01/20 Yes [name], MD  pantoprazole (PROTONIX) 40 MG tablet Take 1 tablet (40 mg total) by mouth 2 (two) times daily before a meal. Patient not taking: No sig reported 02/16/20 03/17/20  Cristal Ford, DO  polyethylene glycol (MIRALAX / GLYCOLAX) 17 g packet Take 17 g by mouth daily as needed for mild constipation. Patient not taking: Reported on 03/07/2020 03/06/20   Aline August, MD    Allergies    Patient has no known allergies.  Review of Systems   Review of Systems  Respiratory: Positive for shortness of breath.   Cardiovascular: Positive for chest pain.  All other systems reviewed and are negative.   Physical Exam Updated Vital Signs BP 117/74   Pulse 88   Resp (!) 32   SpO2  98%   Physical Exam Vitals and nursing note reviewed.  Constitutional:      General: He is not in acute distress.    Appearance: He is well-developed and well-nourished.     Comments: Appears chronically ill, in NAD today  HENT:     Head: Normocephalic and atraumatic.  Eyes:     Extraocular Movements: EOM normal.     Conjunctiva/sclera: Conjunctivae normal.     Pupils: Pupils are equal, round, and reactive to light.  Cardiovascular:     Rate and Rhythm: Normal rate and regular rhythm.     Pulses: Normal pulses and intact distal  pulses.  Pulmonary:     Effort: Pulmonary effort is normal. No respiratory distress.     Breath sounds: Normal breath sounds. No wheezing.     Comments: Clear lung sounds in all fields.  Spo2 stable on RA ttp of the L side chest wall where recent chest tube was placed. No surrounding erythema or drainage c/w infection.  Chest:     Chest wall: Tenderness present.  Abdominal:     General: There is no distension.     Palpations: Abdomen is soft. There is no mass.     Tenderness: There is no abdominal tenderness. There is no guarding or rebound.  Musculoskeletal:        General: Normal range of motion.     Cervical back: Normal range of motion and neck  supple.  Skin:    General: Skin is warm and dry.     Capillary Refill: Capillary refill takes less than 2 seconds.  Neurological:     Mental Status: He is alert and oriented to person, place, and time.  Psychiatric:        Mood and Affect: Mood and affect normal.     ED Results / Procedures / Treatments   Labs (all labs ordered are listed, but only abnormal results are displayed) Labs Reviewed  CBC WITH DIFFERENTIAL/PLATELET - Abnormal; Notable for the following components:      Result Value   Lymphs Abs 0.5 (*)    All other components within normal limits  COMPREHENSIVE METABOLIC PANEL - Abnormal; Notable for the following components:   Potassium 3.4 (*)    CO2 21 (*)    Glucose, Bld 100 (*)    Creatinine, Ser 0.55 (*)    Calcium 8.8 (*)    Albumin 3.4 (*)    All other components within normal limits  RESP PANEL BY RT-PCR (FLU A&B, COVID) ARPGX2  TROPONIN I (HIGH SENSITIVITY)  TROPONIN I (HIGH SENSITIVITY)    EKG EKG Interpretation  Date/Time:  Thursday March 07 2020 16:47:18 EST Ventricular Rate:  92 PR Interval:    QRS Duration: 81 QT Interval:  369 QTC Calculation: 457 R Axis:   71 Text Interpretation: Sinus rhythm Low voltage, precordial leads Probable anteroseptal infarct, old since last tracing no  significant change Confirmed by Noemi Chapel [phone]) on 03/07/2020 5:18:16 PM   Radiology DG Chest Portable 1 View  Result Date: 03/07/2020 CLINICAL DATA:  Increased short of breath EXAM: PORTABLE CHEST 1 VIEW COMPARISON:  03/06/2020,, 03/05/2020, 03/04/2020 CT 02/06/2020 FINDINGS: Right-sided central venous catheter with tip over the SVC. Multiple bilateral lung masses redemonstrated. Probable small pleural effusions. Stable cardiomediastinal silhouette. Suspected small left apical pneumothorax without great change. IMPRESSION: Suspected small left apical pneumothorax without significant change. Redemonstrated multiple lung masses. Probable small pleural effusions Electronically Signed   By: Donavan Foil M.D.   On: 03/07/2020 17:48   DG CHEST PORT 1 VIEW  Result Date: 03/06/2020 CLINICAL DATA:  Shortness of breath. EXAM: PORTABLE CHEST 1 VIEW COMPARISON:  03/05/2020. FINDINGS: PowerPort catheter with tip over superior vena cava. Heart size stable. Persistent bilateral pulmonary masses are again noted findings again most consistent metastatic disease. Tiny left apical pneumothorax may remain. Small left pleural effusion. IMPRESSION: 1. Persistent bilateral pulmonary masses most consistent with metastatic disease again noted. No interim change. Small left pleural effusion. 2. Tiny left apical pneumothorax may remain. Electronically Signed   By: Marcello Moores  Register   On: 03/06/2020 05:26    Procedures Procedures   Medications Ordered in ED Medications  fentaNYL (SUBLIMAZE) injection 50 mcg (50 mcg Intravenous Given 03/07/20 1754)  iohexol (OMNIPAQUE) 350 MG/ML injection 75 mL (75 mLs Intravenous Contrast Given 03/07/20 1830)    ED Course  I have reviewed the triage vital signs and the nursing notes.  Pertinent labs & imaging results that were available during my care of the patient were reviewed by me and considered in my medical decision making (see chart for details).    MDM  Rules/Calculators/A&P                          Patient presented for evaluation of chest pain or shortness of breath.  On exam, patient appears chronically ill, but not in acute distress currently.  He does have tenderness palpation  of the chest wall recent chest tube was placed, otherwise pulmonary exam is reassuring.  Sats stable on room air.  However considering his recent history of multiple pneumothorax, will obtain chest x-ray to rule out pneumothorax.  Also consider post chest tube pneumonia, PE, ACS.  Will obtain labs, EKG, continue to monitor.  Chest x-ray viewed interpreted by me, shows stable small apical left pneumothorax, unchanged from previous.  Discussed findings with patient.  Discussed potential other causes for pain and shortness of breath including PE, ACS, metabolic abnormalities.  Patient symptoms may also be coming from his lung cancer.  Will obtain a CTA, treat symptomatically, and continue to monitor.  On further evaluation, patient has some tenderness of his left leg, which he states is new in the past couple days.  He also reports tingling of the foot, which is also new.  However he does have a history of radiation to this leg.  Ultrasound is not available at this facility at this time to look for DVT.   OP DVT study ordered.   Pt signed out to Hazle Nordmann, MD for f/u on cta. If negative, and pt can ambulate without hypoxia, consider d/c.   Final Clinical Impression(s) / ED Diagnoses Final diagnoses:  None    Rx / DC Orders ED Discharge Orders         Ordered    US Venous Img Lower Unilateral Left        03/07/20 [place], Sophia, PA-C 03/07/20 1836    Noemi Chapel, MD 03/07/20 2116

## 2020-03-07 NOTE — Progress Notes (Unsigned)
Referral sent to [facility] for palliative care due to insurance.  Records faxed on 2/24

## 2020-03-07 NOTE — ED Triage Notes (Signed)
Pt from home via [facility] with C/O SHOB. Pt states he had chest tube removed Monday morning and is now having increased SHOB.

## 2020-03-08 ENCOUNTER — Other Ambulatory Visit: Payer: Self-pay | Admitting: *Deleted

## 2020-03-08 ENCOUNTER — Other Ambulatory Visit (HOSPITAL_COMMUNITY): Payer: Self-pay | Admitting: Hematology

## 2020-03-08 NOTE — Patient Outreach (Signed)
[facility] [facility]) Care Management  03/08/2020  [place] Kinyon Fogelman 10-31-1960 [id number]   [place] coordination- collaboration with [place] care staff  [facility] RN CM receive a call from Rachael Fee care [phone] Referral received for palliative services for pt Discussed [facility] referral for pt on 02/29/20, [facility] RN CM outreach to patient on 03/06/20 and outreach from [facility] staff [phone]) on 03/06/20 Discuss consult with [facility] online services and representative for in network palliative and hospice services locally for patient Discussed the home services pt is to receive to include home health and a noted referral for personal care services Inpatient [facility] staff assisted with resumption orders of [facility] for [facility] Discussed the care of his wife, Ivin Booty and support of a son per pt [facility] is in network Pt noted return to ED on 03/06/20 for shortness of breath (sob)  with discharge home  [place] care to outreach to patient to schedule an appointment for a home visit    Plan [facility] RN CM will follow up with Mr Meine within the next 2-7 business days   [place] L. Lavina Hamman, RN, BSN, [place] Coordinator Office number [phone] Mobile number [phone]  Main THN number [phone] Fax number [phone]

## 2020-03-11 ENCOUNTER — Other Ambulatory Visit: Payer: Self-pay | Admitting: *Deleted

## 2020-03-11 ENCOUNTER — Telehealth: Payer: Self-pay

## 2020-03-11 ENCOUNTER — Encounter: Payer: Self-pay | Admitting: *Deleted

## 2020-03-11 ENCOUNTER — Other Ambulatory Visit: Payer: Self-pay

## 2020-03-11 ENCOUNTER — Encounter (HOSPITAL_COMMUNITY): Payer: Self-pay

## 2020-03-11 NOTE — Patient Outreach (Signed)
[facility]) Care Management  03/11/2020  Leonard [name] Sep 10, 1960 [id number]   [place] coordination  Completed [facility] multidisciplinary care discussion template and sent to [place] Review case during [facility] multidisciplinary care discussion on 03/14/20   Shanon Seawright L. Lavina Hamman, RN, BSN, [place] Coordinator Office number [phone] Mobile number [phone]  Main THN number [phone] Fax number [phone]

## 2020-03-11 NOTE — Progress Notes (Signed)
Notification received that patient ineligible for PCS.

## 2020-03-11 NOTE — Patient Outreach (Signed)
[facility]) Care Management  03/11/2020  Leonard [name] 1960-09-21 [id number]  [facility] Telephone follow up outreach for post hospital/complex care referral  Referral Date: 02/29/20 Referral Source: [facility] hospital liaison Referral Reason: high risk member, [facility], diagnosis cancer with mets, complex care & disease management follow up calls & assess for additional support, wife is sick also per Inpatient TOC CM, further needs Insurance:[employer] [employer]   Last admission: -  ED 03/07/20 shortness of breath 03/02/20-03/06/20 spontaneous hydropneumothorax, recurrent in the setting of metastatic disease to th lung, 02/20/20- 02/29/20 diarrhea, hypokalemia, spontaneous hydropneumothorax, recurrent in the setting of metastatic disease to th lung, LLL, hemoptysis   Outreach attempt # 2 successful Patient is able to verify HIPAA, DOB and address Reviewed and addressed referral to [facility] patient Consent: [facility] RN CM reviewed [facility] services with patient. Patient gave verbal consent for services [facility] telephonic RN CM.  Leonard [name] is reporting he is doing well It is noted that he has less coughing, wheezing today and Morphine oral has been ordered per EPIC for every 2 hours prn   THN RN CM updated Leonard [name] on outreaches from [place] [employer] laboratory technician.  THN RN CM discussed differences in hospice and palliative care Questions answered  THN RN CM inquired about the noted [facility] [place]) services on 03/08/20. Leonard [name] confirms after his 03/07/20 ED visit for shortness of breath (sob) he received a home visit on 03/08/20 from [place] to start Hospice services He confirms he is being seen daily since 03/08/20 by staff.  He continues to cough up blood, voices being aware that this will continue but is able to confirm home care changes to decrease symptoms, comfort care  He reports he and his wife have 2 children who  are out of town and "good kids who have their own lives"  He reports the "entire family came this weekend" to help with replacement of a washing machine and had a "good visit"  He and [facility] RN CM conversed and he was able to express his feelings about his wife cancer diagnosis, her disappointment with radiation not starting today as planned and possible future DME. He states this has made his wife a "little depressed" He is now offering encouragement to her  Depression screen [facility] 2/9 03/11/2020 03/06/2020 11/17/2019  Decreased Interest 0 0 0  Down, Depressed, Hopeless 1 1 1   PHQ - 2 Score 1 1 1     He and THN RN CM spoke about DME to help to keep him and his wife active as he reports "we are so use to going out and have been staying too much in the house" He shared how they just like to ride out at times in down town [place]. Remaining active a positive value for Leonard [name] He reports hospice staff is coordinating a possible Rolator/scooter (so his wife will not have to try to push him around if they go out) and small portable oxygen tanks.  Support and empathy provided  Pt was encouraged to out reach prn to [facility] RN CM Discussed warm transferring service to [employer] in future and Hospice as they provide external care management services also     [facility] RN CM interventions Rachael Fee care [phone] updated  updated stephanie [phone] of [place] hospice  left a HIPAA compliant voice message   Plan [facility] RN CM will follow up with Leonard  [name] within the next 7-10 business days [facility] RN CM will present Leonard [name] during 03/14/20 [facility] multidisciplinary team discussion    Joelene Millin L. Lavina Hamman, RN, BSN, [place] Coordinator Office number [phone] Mobile number [phone]  Main THN number [phone] Fax number [phone]

## 2020-03-11 NOTE — Telephone Encounter (Signed)
Spoke with patient on Thursday and he stated he had a appointment for "someone" to come out on Friday 2/25. Advised patient I would call back this morning to see what the company name was that came out.   Spoke with patient and his wife this morning. Patient was seen by [facility] on 2/25. [facility] and verified patient did admit to Hospice on 03/08/20. Will close Palliative care referral.

## 2020-03-15 ENCOUNTER — Encounter (HOSPITAL_COMMUNITY): Payer: Self-pay | Admitting: Emergency Medicine

## 2020-03-15 ENCOUNTER — Emergency Department (HOSPITAL_COMMUNITY)
Admission: EM | Admit: 2020-03-15 | Discharge: 2020-03-15 | Disposition: A | Discharge status: Home / Self Care | Payer: 59 | Attending: Emergency Medicine | Admitting: Emergency Medicine

## 2020-03-15 ENCOUNTER — Other Ambulatory Visit: Payer: Self-pay

## 2020-03-15 ENCOUNTER — Other Ambulatory Visit: Payer: Self-pay | Admitting: *Deleted

## 2020-03-15 DIAGNOSIS — Z87891 Personal history of nicotine dependence: Secondary | ICD-10-CM | POA: Diagnosis not present

## 2020-03-15 DIAGNOSIS — C7801 Secondary malignant neoplasm of right lung: Secondary | ICD-10-CM

## 2020-03-15 DIAGNOSIS — Z515 Encounter for palliative care: Secondary | ICD-10-CM

## 2020-03-15 DIAGNOSIS — Z85118 Personal history of other malignant neoplasm of bronchus and lung: Secondary | ICD-10-CM | POA: Insufficient documentation

## 2020-03-15 DIAGNOSIS — R062 Wheezing: Secondary | ICD-10-CM | POA: Insufficient documentation

## 2020-03-15 DIAGNOSIS — C7802 Secondary malignant neoplasm of left lung: Secondary | ICD-10-CM

## 2020-03-15 MED ORDER — ALBUTEROL SULFATE HFA 108 (90 BASE) MCG/ACT IN AERS
4.0000 | INHALATION_SPRAY | Freq: Once | RESPIRATORY_TRACT | Status: AC
  Administered 2020-03-15: 4 via RESPIRATORY_TRACT
  Filled 2020-03-15: qty 6.7

## 2020-03-15 MED ORDER — IPRATROPIUM BROMIDE HFA 17 MCG/ACT IN AERS: 2.0000 | INHALATION_SPRAY | Freq: Once | RESPIRATORY_TRACT | Status: DC

## 2020-03-15 NOTE — ED Provider Notes (Signed)
[facility] EMERGENCY DEPARTMENT Provider Note   CSN: [id number] Arrival date & time: 03/15/20  5102     History Chief Complaint  Patient presents with  . Shortness of Breath    Leonard [name] is a 60 y.o. male.  Patient with hx metastatic cancer, hospice patient, presents via EMS. Patient indicates had fallen, and could not get up into bed so EMS was called. EMS transported to ED noting low o2 sats on room air. Patient indicates he uses 4 liters o2 [place], but wasn't on at the time. Patient denies syncope or LOC. No head injury or headache. No neck or back pain. No new or focal numbness/weaknkess. Pt does indicate feels generally weak on chronic basis. Pt denies new/acute sob, and does feel he is getting adequate oxygen. He does not mdi/albterol treatment use and is mildly wheezing. Denies new pain or injury.   The history is provided by the patient and the EMS personnel.       Past Medical History:  Diagnosis Date  . Cancer [facility])    metastatic lung cancer  . Port-A-Cath in place 05/19/2019   right    Patient Active Problem List   Diagnosis Date Noted  . Spontaneous pneumothorax 02/20/2020  . Left-sided hydropneumothorax--associated with necrotic malignant mass 02/09/2020  . Pneumothorax on left 02/07/2020  . Dehydration 08/21/2019  . Port-A-Cath in place 05/19/2019  . 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 Orthopedic aftercare 12/27/2018  . Sarcoma of left lower extremity ([place]) 08/16/2018  . Esophageal dysphagia 08/09/2018  . Leg mass, left 08/02/2018  . Localized swelling of left lower leg 08/01/2018  . Food impaction of esophagus 06/27/2017  . NSAID induced gastritis     Past Surgical History:  Procedure Laterality Date  . CHOLECYSTECTOMY    . ESOPHAGEAL DILATION N/A 06/27/2017   Procedure: ESOPHAGEAL DILATION;  Surgeon: Danie Binder, MD;  Location: [facility] ENDO SUITE;  Service: Endoscopy;  Laterality: N/A;  . ESOPHAGOGASTRODUODENOSCOPY N/A 06/27/2017    Procedure: ESOPHAGOGASTRODUODENOSCOPY (EGD);  Surgeon: Danie Binder, MD;  Location: [facility] ENDO SUITE;  Service: Endoscopy;  Laterality: N/A;  . IR IMAGING GUIDED PORT INSERTION  05/04/2019  . knee sx         Family History  Problem Relation Age of Onset  . Heart disease Father   . Cancer Maternal Aunt   . Cancer Maternal Uncle   . Cancer Paternal Uncle   . Vision loss Maternal Grandfather   . Colon cancer Neg Hx   . Colon polyps Neg Hx     Social History   Tobacco Use  . Smoking status: Former Research scientist (life sciences)  . Smokeless tobacco: Never Used  Vaping Use  . Vaping Use: Never used  Substance Use Topics  . Alcohol use: Yes    Comment: 1-2 drinks per month  . Drug use: Never    Home Medications Prior to Admission medications   Medication Sig Start Date End Date Taking? Authorizing Provider  ALPRAZolam (XANAX) 0.25 MG tablet Take 1 tablet (0.25 mg total) by mouth 2 (two) times daily as needed for anxiety. Patient taking differently: Take 0.25 mg by mouth See admin instructions. Take 0.25 mg by mouth at bedtime and an additional 0.25 mg once a day as needed for anxiety 10/30/19   [name], MD  benzonatate (TESSALON) 200 MG capsule Take 1 capsule (200 mg total) by mouth 3 (three) times daily. 02/20/20   [name], MD  escitalopram (LEXAPRO) 10 MG tablet Take 1  tablet (10 mg total) by mouth at bedtime. 03/06/20   Aline August, MD  gabapentin (NEURONTIN) 300 MG capsule Take 1 capsule (300 mg total) by mouth in the morning and at bedtime. 03/06/20 04/05/20  Aline August, MD  HYDROcodone-homatropine (HYCODAN) 5-1.5 MG/5ML syrup Take 10 mLs by mouth every 6 (six) hours as needed for cough. SMARTSIG:1 Teaspoon By Mouth Every 6 Hours PRN Patient taking differently: Take 10 mLs by mouth every 6 (six) hours as needed for cough. 02/20/20   [name], MD  Morphine Sulfate (MORPHINE CONCENTRATE) 10 mg / 0.5 ml concentrated solution Take 0.25 mLs by mouth every 2 (two)  hours as needed. 03/09/20   [provider]  Oxycodone HCl 10 MG TABS Take 0.5 tablets (5 mg total) by mouth every 8 (eight) hours as needed. 01/03/20   Tanner, Lyndon Code., PA-C  pantoprazole (PROTONIX) 40 MG tablet Take 1 tablet (40 mg total) by mouth 2 (two) times daily before a meal. Patient not taking: No sig reported 02/16/20 03/17/20  Cristal Ford, DO  polyethylene glycol (MIRALAX / GLYCOLAX) 17 g packet Take 17 g by mouth daily as needed for mild constipation. Patient not taking: Reported on 03/07/2020 03/06/20   Aline August, MD  prochlorperazine (COMPAZINE) 10 MG tablet TAKE (1) TABLET BY MOUTH EVERY (6) HOURS AS NEEDED. Patient taking differently: Take 10 mg by mouth every 6 (six) hours as needed for nausea or vomiting. 01/02/20   Benay Pike, MD  zolpidem (AMBIEN) 10 MG tablet TAKE (1) TABLET BY MOUTH AT BEDTIME AS NEEDED FOR SLEEP 03/08/20   [name], MD    Allergies    Patient has no known allergies.  Review of Systems   Review of Systems  Constitutional: Negative for fever.  HENT: Negative for nosebleeds.   Eyes: Negative for pain and visual disturbance.  Respiratory: Positive for wheezing. Negative for cough.   Cardiovascular: Negative for chest pain.  Gastrointestinal: Negative for abdominal pain and vomiting.  Genitourinary: Negative for flank pain.  Musculoskeletal: Negative for back pain and neck pain.  Skin: Negative for rash.  Neurological: Negative for headaches.  Hematological: Does not bruise/bleed easily.  Psychiatric/Behavioral: Negative for agitation.    Physical Exam Updated Vital Signs BP 105/74   Pulse (!) 49   Temp 98.1 F (36.7 C)   Resp 12   Ht 1.778 m (5\' 10" )   Wt 90.7 kg   SpO2 99%   BMI 28.69 kg/m   Physical Exam Vitals and nursing note reviewed.  Constitutional:      Appearance: Normal appearance. He is well-developed.  HENT:     Head: Atraumatic.     Nose: Nose normal.     Mouth/Throat:     Mouth: Mucous  membranes are moist.     Pharynx: Oropharynx is clear.  Eyes:     General: No scleral icterus.    Conjunctiva/sclera: Conjunctivae normal.     Pupils: Pupils are equal, round, and reactive to light.  Neck:     Trachea: No tracheal deviation.  Cardiovascular:     Rate and Rhythm: Normal rate and regular rhythm.     Pulses: Normal pulses.     Heart sounds: Normal heart sounds. No murmur heard. No friction rub. No gallop.   Pulmonary:     Effort: Pulmonary effort is normal. No accessory muscle usage or respiratory distress.     Breath sounds: Wheezing present.     Comments: Port right chest without sign of infection.  Abdominal:  General: Bowel sounds are normal. There is no distension.     Palpations: Abdomen is soft.     Tenderness: There is no abdominal tenderness.  Genitourinary:    Comments: No cva tenderness. Musculoskeletal:        General: No tenderness.     Cervical back: Normal range of motion and neck supple. No rigidity or tenderness.     Comments: CTLS spine, non tender, aligned, no step off. Good rom bil ext without pain or focal bony tenderness.   Skin:    General: Skin is warm and dry.     Findings: No rash.  Neurological:     Mental Status: He is alert.     Comments: Alert, speech clear. GCS 15. Motor/sens grossly intact.   Psychiatric:        Mood and Affect: Mood normal.     ED Results / Procedures / Treatments   Labs (all labs ordered are listed, but only abnormal results are displayed) Labs Reviewed - No data to display  EKG EKG Interpretation  Date/Time:  Friday March 15 2020 06:53:15 EST Ventricular Rate:  115 PR Interval:    QRS Duration: 88 QT Interval:  361 QTC Calculation: 419 R Axis:   46 Text Interpretation: Sinus tachycardia Premature ventricular complexes Confirmed by Lajean Saver [phone]) on 03/15/2020 7:19:34 AM   Radiology No results found.  Procedures Procedures   Medications Ordered in ED Medications  albuterol  (VENTOLIN HFA) 108 (90 Base) MCG/ACT inhaler 4 puff (has no administration in time range)  ipratropium (ATROVENT HFA) inhaler 2 puff (has no administration in time range)    ED Course  I have reviewed the triage vital signs and the nursing notes.  Pertinent labs & imaging results that were available during my care of the patient were reviewed by me and considered in my medical decision making (see chart for details).    MDM Rules/Calculators/A&P                         Reviewed nursing notes and prior charts for additional history.   Discussed goals of care, code wishes with patient. Patient confirms DNR, and hospice/comfort care wishes/plan.   Hospice rn called and indicates no ED/hospital testing needed, pt is hospice patient and they are providing and continuing to provide comfort/hospice care on ongoing basis.  They indicate EMS was called as family was unable to get patient back into bed on their own.  Pt is agreeable w plan, he does not want additional ED/hospital testing and/or procedures, although is agreeable w breathing tx.   Albuterol/atrovent tx.   Pt is breathing comfortably, feels as if he is getting enough air. sats on his 4 liters [place] 97-98%.   Given end stage, progressive ca, pts long and short term prognosis is extremely limited, he is appropriately followed by hospice care and is in agreement with those goals of care/care plan.  Will facilitate transport back home.     Final Clinical Impression(s) / ED Diagnoses Final diagnoses:  None    Rx / DC Orders ED Discharge Orders    None       Lajean Saver, MD 03/15/20 [phone]

## 2020-03-15 NOTE — ED Notes (Signed)
Spoke with patients wife Ivin Booty, she will be picking up patient in 15 minutes.

## 2020-03-15 NOTE — Discharge Instructions (Addendum)
Follow up closely with your hospice care team/nurses.

## 2020-03-15 NOTE — ED Triage Notes (Signed)
[facility] - pt from home c/o SOB and weakness since yesterday. Ems states that yesterday when he stood he would get dizzy. O2 was 85% on RA at home, pt placed on NRB O2 increased

## 2020-03-15 NOTE — Patient Outreach (Signed)
[facility]) Care Management  03/15/2020  Leonard [name] [birth year] [id number]   [facility] multidisciplinary care discussion   Case information for patient reviewed during [facility] multidisciplinary meeting Noted with 03/15/20 ED visit, DNR status and with discharge home   Plans [facility] RN CM will follow up with Leonard [name] within the next 7-10 business days Continue to collaborate with community hospice staff (services, pt wishes)   Joelene Millin L. Lavina Hamman, RN, BSN, [place] Coordinator Office number [phone] Main [facility] number [phone] Fax number [phone]

## 2020-03-19 ENCOUNTER — Institutional Professional Consult (permissible substitution): Payer: 59 | Admitting: Internal Medicine

## 2020-03-22 ENCOUNTER — Other Ambulatory Visit: Payer: Self-pay | Admitting: *Deleted

## 2020-03-22 ENCOUNTER — Other Ambulatory Visit: Payer: Self-pay

## 2020-03-22 NOTE — Patient Outreach (Addendum)
[facility]) Care Management  03/22/2020  Leonard [name] 1960-07-29 [id number]   [facility] Telephone Assessment/Screen post hospital/complex care referral with case closure   Referral Date: 02/29/20 Referral Source: [facility] hospital liaison Referral Reason: high risk member, [employer], diagnosis cancer with mets, complex care & disease management follow up calls & assess for additional support, wife is sick also per Inpatient TOC CM, further needs Insurance:[employer]  Last admission: - 03/02/20-03/06/20 spontaneous hydropneumothorax, recurrent in the setting of metastatic disease to th lung, 02/20/20- 02/29/20 diarrhea, hypokalemia, spontaneous hydropneumothorax, recurrent in the setting of metastatic disease to th lung, LLL, hemoptysis   Follow up assessment  Patient is able to verify HIPAA ([place]) identifiers Reviewed and addressed the purpose of the follow up call with the patient  Consent: [facility] RN CM reviewed [facility] services with patient. Patient gave verbal consent for services.   Leonard [name] reports he is doing fair and continues with hospice services/visits Personal care services application was completed with [place] with fatigue He states his wife continues to some coping concerns with her cancer diagnosis but tests have been completed He gives [facility] RN Cm permission to speak with his wife Care giver support provided Answered questions Nausea and vomiting controlled with prn  Anemia and pain manageable with medications Reports coping well    [facility] RN CM care coordination Message was left for [facility] RN at [phone] after being transferred by jessica to [place] CM attempted to discuss pt voiced concerns and to see if [facility] RN CM services are needed Pending return call from [place] RN CM disease management/education  Plans [facility] RN CM will close pt  [facility] case as he is active with [facility]  Pt encouraged to return a call to [facility] RN CM prn  Goals Addressed              This Visit's Progress     Patient Stated   .  COMPLETED: ([facility]) Careful Skin Care-Graft Versus-Host Disease (pt-stated)   On track     Timeframe:  Short-Term Goal Priority:  Medium Start Date:             03/07/20                Expected End Date:                       Follow Up Date 03/14/20   - clean and dry skin well     Notes 03/22/20 case closure Pt active with [facility]    .  COMPLETED: ([facility]) Keep Nausea and Vomiting Under Control-Cancer Treatment Phase (pt-stated)   On track     Timeframe:  Short-Term Goal Priority:  High Start Date:                     03/07/20        Expected End Date:                          - take medicine for nausea on a regular schedule    Notes:  03/22/20 case closure Pt active with [facility] Nausea and vomiting controlled with prn medicines        Argie Lober L. Lavina Hamman, RN, BSN, [place] Coordinator Office number [phone] Main [facility]  number [phone] Fax number [phone]

## 2020-05-12 DEATH — deceased

## 2021-11-14 IMAGING — CT CT CHEST-ABD-PELV W/ CM
2 of 5 series · 12 of 36 positions shown, 14 images · IV contrast (Omnipaque or Isovue)
Comparison: 07/19/2019

CLINICAL DATA: History of liposarcoma, follow-up de differentiated
liposarcoma of the LEFT lower extremity.

EXAM:
CT CHEST, ABDOMEN, AND PELVIS WITH CONTRAST
TECHNIQUE: Multidetector CT imaging of the chest, abdomen and pelvis was
performed following the standard protocol during bolus
administration of intravenous contrast.
CONTRAST:  100mL OMNIPAQUE IOHEXOL 300 MG/ML  SOLN

[Series 2: cap with · axial · 0.82mm/px · z∈[-434,+106]mm · 9 of 136 slices shown, 11 images]
[im 14/136  mediastinal]
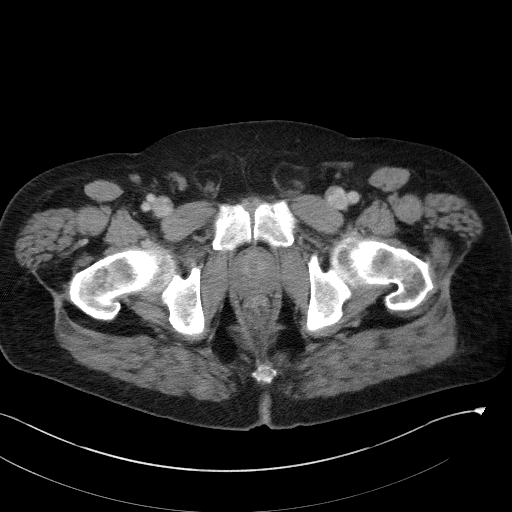
[im 14/136  bone]
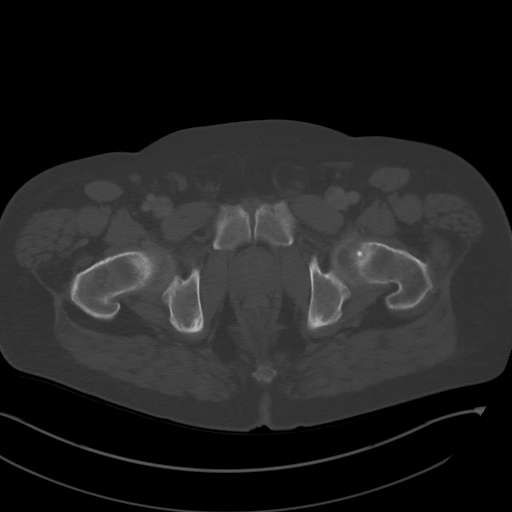
[im 28/136  mediastinal]
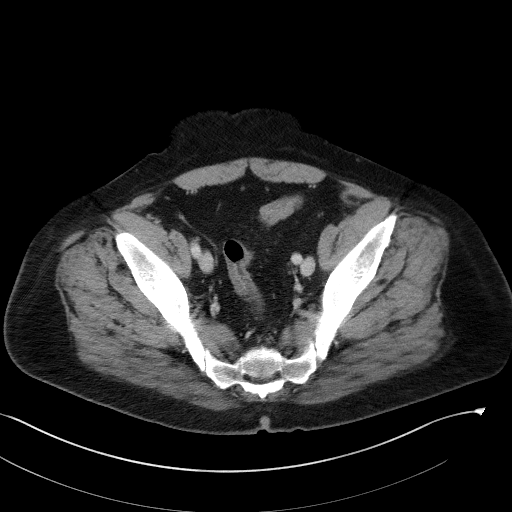
[im 41/136  mediastinal]
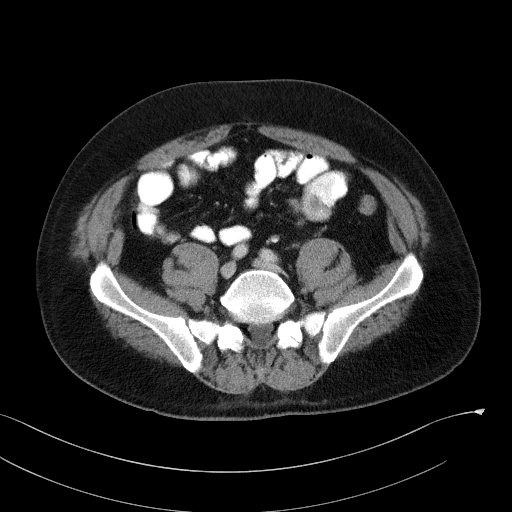
[im 55/136  mediastinal]
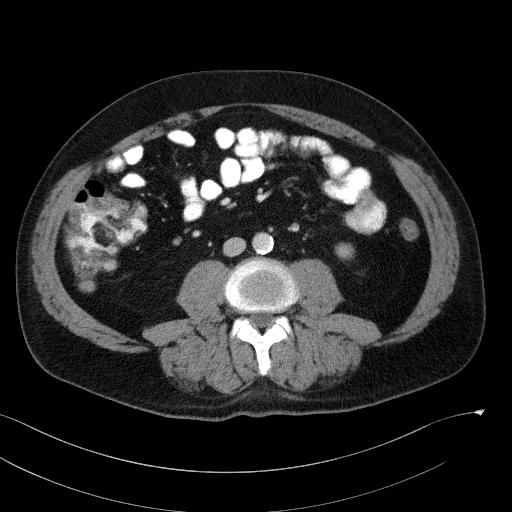
[im 68/136  mediastinal]
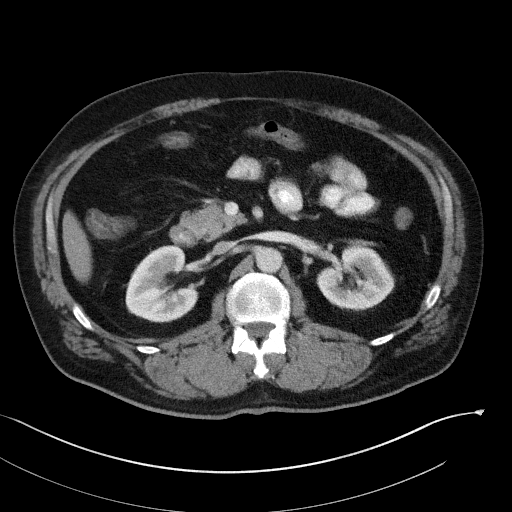
[im 82/136  mediastinal]
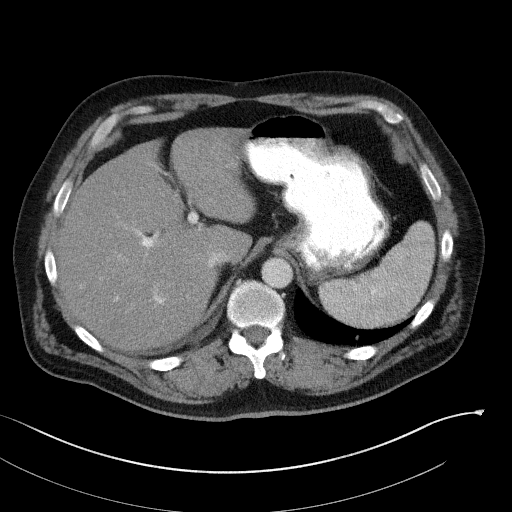
[im 95/136  mediastinal]
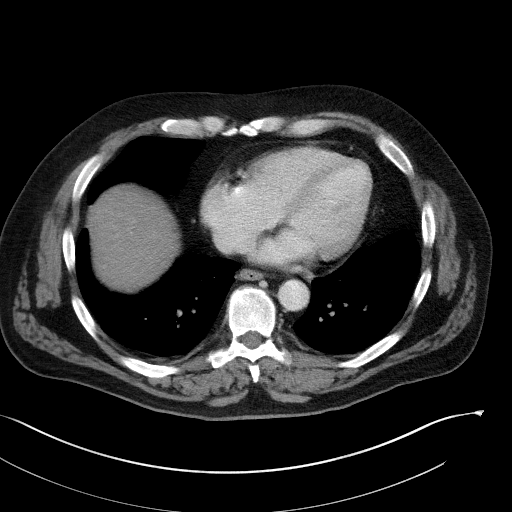
[im 109/136  mediastinal]
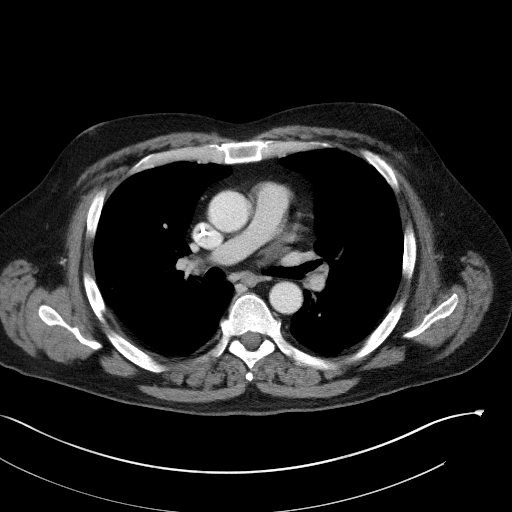
[im 122/136  mediastinal]
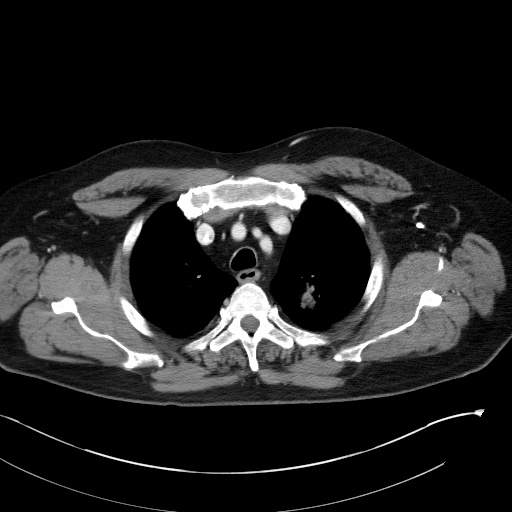
[im 122/136  bone]
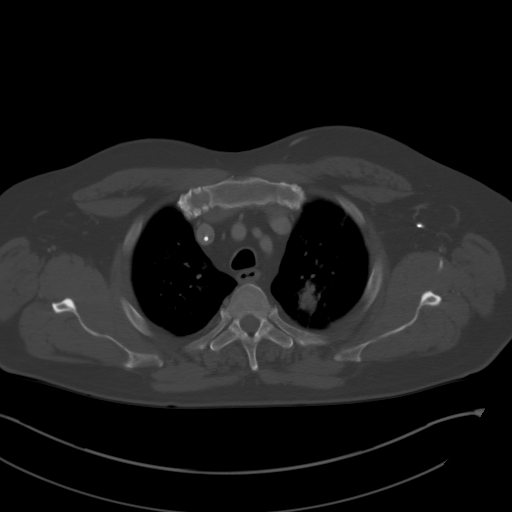

[Series 4: coronals · coronal · 0.81mm/px · 3 of 153 slices shown]
[im 31/153  mediastinal]
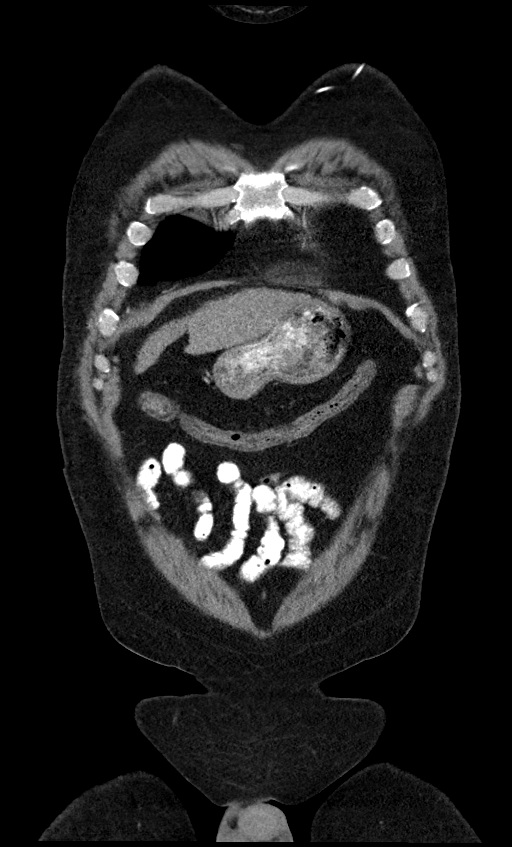
[im 61/153  mediastinal]
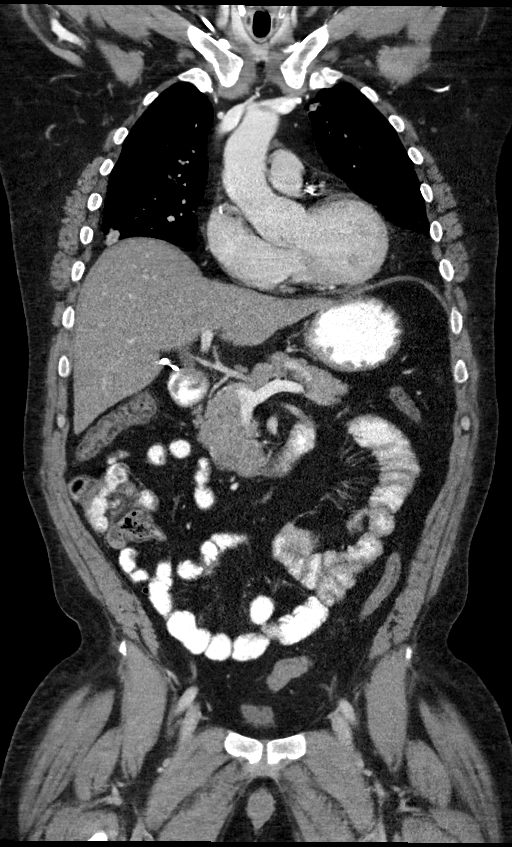
[im 92/153  mediastinal]
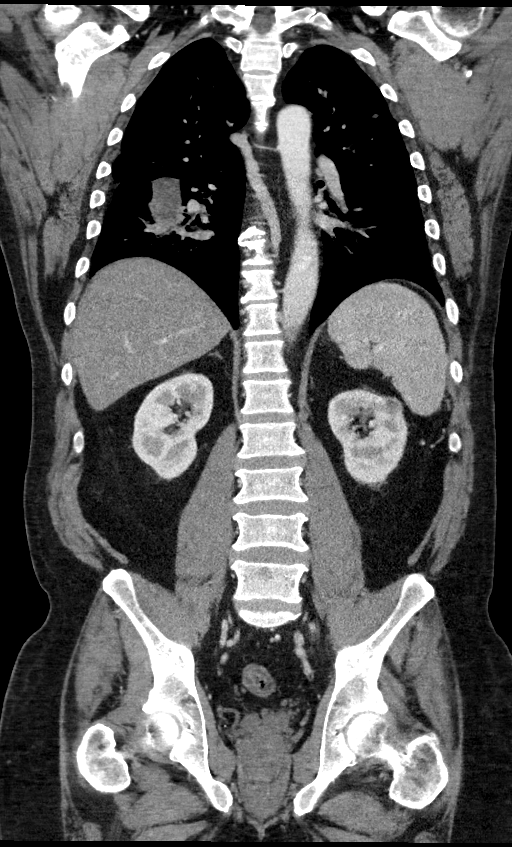

[12 of 36 positions shown; findings below may reference images not displayed]

FINDINGS: CT CHEST FINDINGS

Cardiovascular: RIGHT-sided Port-A-Cath in situ terminates at the
caval to atrial junction. Heart size is stable without pericardial
effusion. Three-vessel coronary artery disease. Thoracic aorta is
normal caliber with scattered atheromatous plaque. Central pulmonary
vasculature unremarkable on venous phase assessment.

Mediastinum/Nodes: No adenopathy in the chest.

Lungs/Pleura: Numerous pulmonary lesions. A RIGHT lung lesion on
image 62 of series 3 measuring 2 x 1.6 cm. Previously 1.5 x 1.4 cm.

LEFT upper lobe lesion measuring 2.1 x 1.8 cm previously
approximately 3.3 x 2.7 cm (image 31, series 3)

Lingular lesion 2.2 x 2.3 cm (image 77, series 3) is previously
x 3.1 cm.

Near complete resolution of small RIGHT upper lobe lesion in the
medial RIGHT upper lobe with only bandlike area remaining on image
48 of series 3. This area previously measured 1.4 x 1.3 cm,
currently 1 cm in length an approximately 3 mm short axis.

RIGHT upper lobe lesion just above the fissure (image 93, series [DATE] cm previously 2.6 cm.

(Image 91, series [DATE] x 4.3 cm lesion with low-density,
previously approximately 7.3 by 2.8 cm.

Tiny new lesion approximately 5 mm on image 73 of series 3 along the
minor fissure.

Musculoskeletal: New lytic focus with subacute fracture in the LEFT
posterior fourth rib. Perhaps a subtle area on the most recent prior
but no abnormality seen on the initial evaluation.

CT ABDOMEN PELVIS FINDINGS

Hepatobiliary: Hepatic steatosis. No focal hepatic lesion. Portal
vein is patent. Post cholecystectomy without biliary duct dilation.

Pancreas: Pancreas is normal without ductal dilation or sign of
inflammation.

Spleen: Spleen normal in size and contour without focal lesion.

Adrenals/Urinary Tract: Adrenal glands are normal. Symmetric renal
enhancement. No hydronephrosis. Stable, approximately 1.5 cm
low-density lesion in the interpolar LEFT kidney.

Stomach/Bowel: No acute gastrointestinal process.

Vascular/Lymphatic: Calcified atheromatous plaque of the abdominal
aorta without aneurysmal dilation. There is no gastrohepatic or
hepatoduodenal ligament lymphadenopathy. No retroperitoneal or
mesenteric lymphadenopathy. No pelvic sidewall lymphadenopathy.

Reproductive: Prostate with mild heterogeneity, nonspecific on CT
and unchanged.

Other: No ascites.

Musculoskeletal: Rib lesion as described above. Signs of avascular
necrosis of bilateral femoral heads. Subtle and unchanged.
IMPRESSION: 1. On balance, interval improvement with respect to pulmonary
metastatic disease but with new 5 mm nodule and with increase in
size of 1 of the RIGHT upper lobe lesions and low attenuation along
the major fissure in the RIGHT chest. Low attenuation along the
major fissure in the RIGHT chest with shorter long axis dimension
but with increase in overall volume as measured by this observer on
the prior exam.
2. New lytic focus with subacute fracture in the LEFT posterior
fourth rib. Perhaps a subtle area on the most recent prior but no
abnormality seen on the initial evaluation. Attention on follow-up.
3. Hepatic steatosis.
4. Stable, approximately 1.5 cm low-density lesion in the interpolar
LEFT kidney. This shows low-density but is mildly heterogeneous.
Lesion is unchanged compared to previous studies, attention on
follow-up.
5. Signs of avascular necrosis of bilateral femoral heads. Subtle
and unchanged.
6. Aortic atherosclerosis.

Aortic Atherosclerosis ([id number]).
